# Patient Record
Sex: Male | Born: 1935
Health system: Southern US, Community
[De-identification: ages and names within clinical notes are randomized; demographics above are authoritative.]

## PROBLEM LIST (undated history)

## (undated) DIAGNOSIS — E119 Type 2 diabetes mellitus without complications: Secondary | ICD-10-CM

## (undated) DIAGNOSIS — N289 Disorder of kidney and ureter, unspecified: Secondary | ICD-10-CM

## (undated) DIAGNOSIS — M1990 Unspecified osteoarthritis, unspecified site: Secondary | ICD-10-CM

## (undated) DIAGNOSIS — N429 Disorder of prostate, unspecified: Secondary | ICD-10-CM

## (undated) DIAGNOSIS — R569 Unspecified convulsions: Secondary | ICD-10-CM

## (undated) DIAGNOSIS — N200 Calculus of kidney: Secondary | ICD-10-CM

## (undated) HISTORY — PX: TONSILLECTOMY: SUR1361

---

## 1998-03-20 ENCOUNTER — Ambulatory Visit (HOSPITAL_COMMUNITY): Admission: RE | Admit: 1998-03-20 | Discharge: 1998-03-20 | Payer: Self-pay | Admitting: Family Medicine

## 2003-02-16 ENCOUNTER — Encounter: Admission: RE | Admit: 2003-02-16 | Discharge: 2003-02-16 | Payer: Self-pay | Admitting: Family Medicine

## 2005-01-01 ENCOUNTER — Encounter: Admission: RE | Admit: 2005-01-01 | Discharge: 2005-01-01 | Payer: Self-pay | Admitting: Family Medicine

## 2005-01-16 ENCOUNTER — Encounter: Admission: RE | Admit: 2005-01-16 | Discharge: 2005-01-16 | Payer: Self-pay | Admitting: Family Medicine

## 2005-01-27 ENCOUNTER — Encounter: Admission: RE | Admit: 2005-01-27 | Discharge: 2005-01-27 | Payer: Self-pay | Admitting: Family Medicine

## 2005-03-16 HISTORY — PX: JOINT REPLACEMENT: SHX530

## 2005-11-28 ENCOUNTER — Encounter: Admission: RE | Admit: 2005-11-28 | Discharge: 2005-11-28 | Payer: Self-pay | Admitting: Orthopedic Surgery

## 2006-02-09 ENCOUNTER — Inpatient Hospital Stay (HOSPITAL_COMMUNITY): Admission: RE | Admit: 2006-02-09 | Discharge: 2006-02-11 | Payer: Self-pay | Admitting: Orthopedic Surgery

## 2008-03-16 ENCOUNTER — Emergency Department (HOSPITAL_COMMUNITY): Admission: EM | Admit: 2008-03-16 | Discharge: 2008-03-16 | Payer: Self-pay | Admitting: Family Medicine

## 2009-03-14 ENCOUNTER — Emergency Department (HOSPITAL_COMMUNITY): Admission: EM | Admit: 2009-03-14 | Discharge: 2009-03-14 | Payer: Self-pay | Admitting: Family Medicine

## 2010-04-05 ENCOUNTER — Encounter: Payer: Self-pay | Admitting: Family Medicine

## 2010-06-16 LAB — POCT URINALYSIS DIP (DEVICE)
Bilirubin Urine: NEGATIVE
Glucose, UA: 500 mg/dL — AB
Hgb urine dipstick: NEGATIVE
Ketones, ur: 15 mg/dL — AB
Nitrite: NEGATIVE
Protein, ur: NEGATIVE mg/dL
Specific Gravity, Urine: 1.01 (ref 1.005–1.030)
Urobilinogen, UA: 0.2 mg/dL (ref 0.0–1.0)
pH: 6 (ref 5.0–8.0)

## 2010-06-16 LAB — GLUCOSE, CAPILLARY: Glucose-Capillary: 322 mg/dL — ABNORMAL HIGH (ref 70–99)

## 2010-08-01 NOTE — Op Note (Signed)
[name] [name], [name] [name] NO.:  [id number]   MEDICAL RECORD NO.:  [id number]          PATIENT TYPE:  INP   LOCATION:  2899                         FACILITY:  [facility]   PHYSICIAN:  Madlyn Frankel. Charlann Boxer, M.D.  DATE OF BIRTH:  [birth year]   DATE OF PROCEDURE:  02/09/2006  DATE OF DISCHARGE:                               OPERATIVE REPORT   PREOPERATIVE DIAGNOSIS:  Right hip avascular necrosis, traumatic.   POSTOPERATIVE DIAGNOSIS:  Right hip avascular necrosis, traumatic.   PROCEDURE:  Right total hip replacement.   COMPONENTS USED:  DePuy hip system with size 52 pinnacle cup positioned  at 40-45 degrees of abduction and 15-20 degrees of anteversion, 8 mm  beneath the anterior rim with a single cancellous screw due to excellent  initial scratch fit.  A 36 neutral metal-on-metal liner was used.  The  femoral stem was a Trilock 11.3 lateralized offset stem with a 36 +5  ball.   SURGEON:  Madlyn Frankel. Charlann Boxer, M.D.   ASSISTANT:  Yetta Glassman. Mann, P.A.-C.   ANESTHESIA:  General.   BLOOD LOSS:  500 mL.   DRAINS:  None.   COMPLICATIONS:  None.   INDICATIONS FOR PROCEDURE:  [name] [name] is a 76 year old gentleman who  initially presented to me after an injury at work.  Initially, based on  some vague complaints that he had, it was felt that he had strained back  or had an acute flare up of sciatica. After treatment of this failed to  provide relief, we re-evaluated hip and he had a very significant gait  abnormality consistent with hip disease and radiographs consistent with  a diagnosis of AVN.   At this point, based on the advancement of his disease and rapid onset  of disease consistent with his injury, we discussed surgical options. At  this point, the only surgical option available to him, due to the  collapse and degenerative changes, was hip replacement.  Given his level  of pain, he was ready to proceed with this. We discussed the risks and  benefits including  infection, dislocation, DVT, persistent pain postop,  as well as leg length issues.  Consent was obtained.   PROCEDURE IN DETAIL:  The patient was brought to the operating theater.  Once adequate anesthesia and 2 grams Ancef were administered, the  patient was positioned in the left lateral decubitus position with the  right side up. The right lower extremity was then prepped and draped in  a sterile fashion following a pre-scrub.  A posterolateral incision was  made for a posterior approach to the hip.  The patient's skin tissue,  subcutaneous tissue, and muscle were all very taut.  This required  approximately a 10 inch incision in order to maximize our exposure.  Once the hip was exposed, an L-capsulotomy was carried out to save the  capsule for later repair anatomically.  The hip was then dislocated and  initial neck osteotomy was made into the trochanteric fossa.  Based on  the appearance of this cut, I was going to need to mill and planned on  using the calcar miller in order to get this down to its normal length  following broaching.   Following this neck cut, attention was first directed to the femur. With  the leg oriented 90 degrees of the floor, I used this to determine my  anteversion.  A box osteotome was used to set this anteversion.  I then  hand reamed the canal once, irrigated it to prevent fat emboli, and then  began broaching with an 88 broach.  Broaching was carried up to 11.3  with an excellent fit.  At this point, attention was directed to the  acetabulum.  Acetabular exposure was obtained routinely with a  labrectomy as well as retractor placement.  Note that this was not  routine in terms of its mobility due to the patient's tightness, I did  not take any significant measures and releases as we applied some  pressure without retractors, I was able to expose the hip adequately to  use the curved reamers.   Reaming commenced with a 43 reamer with a straight reamer  down to the  medial wall then carried out with a 45, 47, 49, and 51 reamers with an  excellent bony bed preparation.  Following some further labral  debridement and allowing for rim exposure for placement of the  component, the final 52 pinnacle cup was placed and then impacted.  The  position of the cup was anatomic with about 8 mm of bone exposed on the  anterior rim of the wall and the posterior aspect lined up with the  ischium.  There was about 40-45 degrees of abduction and 15-20 degrees  of forward flexion.   At this point, attention was redirected and our trial liner was placed  followed by placement of a cancellous bone screw.  I then redirected  attention to the femur.  Femoral broach was then placed and the trial  reduction carried out with a lateralized offset neck and a +1.5 ball.  Note that I had to calcar mill down to the level of where the broach was  at this point in order to match up the center of the head with the tip  of the trochanter.  Trial reduction was carried out and the hip was  noted to be pretty tight but this was to be expected given his tissue  tension.  The combined anteversion was 45 degrees.  There was no  impingement with external rotation and impingement, the hip was stable  in the sleep position with 20 degrees of adduction and internal rotation  to 60 degrees.  It was stable to neutral abduction and flexion. There  was soft tissue impingement with his abdomen limiting my assessment but  was able to internally rotate to about 60 degrees at this point, as  well.   At this point, the trial components were removed.  I finished off the  acetabular side by placing a hole eliminator followed by the 36 neutral  metal on metal liner.  Attention was redirected back to the femur.  Further cuts were made on the femur to remove any osteophytes as well as  bone cuts from the calcar milling.  The final 11.3 stem was impacted and actually sat a few mm lower than  where my trial was and for this reason,  I trialed again and decided to go with a +5 ball which enhanced my  stability.  Compared to the down leg and compared to our preoperative  position, the leg lengths  appeared to be within a couple mm of each  other.   Given these parameters, we went ahead and impacted the 36 +5 ball onto a  clean and dried trunnion.  The hip was reduced. The hip was irrigated  throughout the case.  I reapproximated the posterior capsule to the  superior leaflet using #1 Ethilon.  The iliotibial band was  reapproximated using #1 Ethilon.  #1 Vicryl was used on the gluteus  fascia.  The remainder of the wound was irrigated again and closed with  2-0 Vicryl and a 4-0 running Monocryl.  The hip was cleaned, dried, and  dressed sterilely with Steri-Strips, dressing sponges and tape.  The  patient was extubated and brought to the recovery room in stable  condition.     Madlyn Frankel Charlann Boxer, M.D.  Electronically Signed    MDO/MEDQ  D:  02/09/2006  T:  02/09/2006  Job:  [id number]

## 2010-08-01 NOTE — H&P (Signed)
[name] [name], [name] [name]                  ACCOUNT NO.:  [id number]   MEDICAL RECORD NO.:  [id number]          PATIENT TYPE:  INP   LOCATION:  NA                           FACILITY:  [facility]   PHYSICIAN:  Madlyn Frankel. Charlann Boxer, M.D.  DATE OF BIRTH:  1934/05/17   DATE OF ADMISSION:  02/09/2006  DATE OF DISCHARGE:                                HISTORY & PHYSICAL   CHIEF COMPLAINT:  Right hip pain.   HISTORY OF PRESENT ILLNESS:  This is a 76 year old male with a history of  right AVN.  He was originally seen on a worker's compensation presentation  on October 15, 2005, when a large heavy object fell and he had a significant  weight transfer on him, causing him to pivot.  He initially presented with  buttock-type complaints.  We tried conservative measures with  antiinflammatories, which he failed.  He also underwent an epidural nerve  injection which provided no relief.  He has tried all conservative measures,  which have failed and he walks with a significant limp.   PAST MEDICAL HISTORY:  Diabetes type 2.   FAMILY HISTORY:  Cancer.   SOCIAL HISTORY:  Single.  [name] [name], will help with care giving  afterwards.   DRUG ALLERGIES:  No known drug allergies.   MEDICATIONS:  Include Janumet 400 b.i.d.   REVIEW OF SYSTEMS:  No new signs or symptoms of any cardiovascular,  respiratory, gastrointestinal, genitourinary, neurological, musculoskeletal  systems.   PHYSICAL EXAMINATION:  VITAL SIGNS:  Temperature 98.3, pulse 78,  respirations 18, blood pressure 144/76.  GENERAL:  Awake, alert and oriented.  Well- developed well-nourished male.  NECK:  Supple, no carotid bruits, no lymphadenopathy.  CHEST:  Lungs clear to auscultation bilaterally.  BREASTS:  Deferred.  HEART:  Regular rate and rhythm without gallops, clicks, rubs or murmurs.  ABDOMEN:  Soft, obese, nontender, nondistended, bowel sounds present all  four quadrants.  GENITOURINARY:  Deferred.  EXTREMITIES:  A significant  Trendelenburg gait.  Has extremely painful hip  range of motion.  Limited adduction to about 30 to 40 degrees on the right.  Hip flexion and internal rotation produces pain and is limited to  approximately 5 degrees.  SKIN:  No rashes, dorsalis pedis pulse positive.  NEUROLOGIC:  He is intact to sensibilities distal.   LABS PENDING:  [name] [name] preop appointment.   X-RAYS:  Chest x-ray pending, pelvis, right hip taken.   IMPRESSION:  1. Right hip avascular necrosis with degenerative changes.  2. Diabetes type 2.   PLAN OF ACTION:  Right total hip replacement by surgeon, Dr. Durene Romans,  on February 09, 2006.  Risks and complications were discussed.  All  questions were encouraged, answered and reviewed.  We look forward to  treating this very pleasant, independent 76 year old gentleman.     ______________________________  [name] [name]. [name] [name], [place]      Madlyn Frankel. Charlann Boxer, M.D.  Electronically Signed    [name] [name]  D:  02/03/2006  T:  02/03/2006  Job:  [id number]

## 2010-08-01 NOTE — Discharge Summary (Signed)
[name], [name] NO.:  [id number]   MEDICAL RECORD NO.:  [id number]          PATIENT TYPE:  INP   LOCATION:  5002                         FACILITY:  [facility]   PHYSICIAN:  Madlyn Frankel. Charlann Boxer, M.D.  DATE OF BIRTH:  1934/06/14   DATE OF ADMISSION:  02/09/2006  DATE OF DISCHARGE:  02/11/2006                               DISCHARGE SUMMARY   ADMITTING DIAGNOSES:  1. Avascular necrosis.  2. Diabetes type 2.   DISCHARGE DIAGNOSES:  1. Avascular necrosis.  2. Diabetes type 2.   CONSULTS:  None.   PROCEDURE:  Right total hip replacement by surgeon, Dr. Lajoyce Corners.  Assisted by Dwyane Luo.   BRIEF HISTORY OF PRESENT ILLNESS:  [name] [name] is a 76 year old male well  known to our practice with a history of right hip AVN.  He was  refractory to all conservative measures.  His quality of life was  diminished due to the persistent progressive pain.  He was scheduled for  a right total hip replacement.  He was cleared medically before this  procedure.   Preadmission labs shows his preadmission CBC:  Hemoglobin is 15.5,  hematocrit 46.2, tracked throughout his course of stay.  Upon discharge,  hemoglobin 12.7, hematocrit 36.3.   Preadmission routine chemistries:  Sodium 139, potassium 4.2, glucose  114, tracked throughout his course of stay.  Upon discharge, sodium 134,  potassium 3.8, glucose 159.   Postoperative hip showed a well-placed right hip replacement with no  complicating features.   HOSPITAL COURSE:  Patient underwent right total hip replacement and  tolerated the procedure well and was admitted to the orthopedic floor.  Pain was well controlled throughout his course of stay.  He was  neuromuscularly and vascularly intact throughout his course of stay.  Vital signs remained stable throughout.  PT/OT was begun within 24 hours  with partial weightbearing 50% with the use of a rolling walker.  Dressing was changed after 24 hours.  It remained clean, dry, and  intact.  Wound showed minimal ooze and dried blood on bandages.  After  postop day 3, patient was doing very well.  Case management assessment  stated as well as ourselves that he was a home health care discharge.   DISCHARGE DISPOSITION:  Discharged home stable in improved condition  with home health care.   DISCHARGE PHYSICAL THERAPY:  Continue 7 times per week with the use of  rolling walker, partial weightbearing 50%.   OCCUPATIONAL THERAPY:  Per assessment at least 2 times per week times a  week, at which reassessment will be completed.  Goals of physical  therapy are going to be for gait retraining, balance exercises, minimize  pain, maximize range of motion, increase muscle strength, and encourage  independence in the activities of daily living.   DISCHARGE WOUND CARE:  Keep wound dry.  Change dressing on a daily  basis.   DISCHARGE MEDICATIONS:  Include home medication of Janumet 500 mg one  p.o. b.i.d and discharge medications:  1. Lovenox 40 mg subcutaneous q.24 x12 additional days.  2. Norco 5/325 one  to two p.o. q.4-6 p.r.n. pain.  3. Robaxin 500 mg 1 tab q.6 p.r.n. spasms.  4. Colace 100 mg 1 tab p.o. b.i.d. p.r.n. constipation.  5. Iron 1 tab twice daily.   Follow up with Dr. Charlann Boxer at [phone] in approximately 2 weeks.  Return to  our office if his condition worsens; if the patient develops acute  shortness of breath or severe calf pain, call emergency services  immediately.     ______________________________  Yetta Glassman [address], [place]      Madlyn Frankel. Charlann Boxer, M.D.  Electronically Signed    BLM/MEDQ  D:  04/19/2006  T:  04/19/2006  Job:  [id number]

## 2012-08-15 ENCOUNTER — Encounter (HOSPITAL_COMMUNITY): Payer: Self-pay | Admitting: *Deleted

## 2012-08-15 ENCOUNTER — Emergency Department (HOSPITAL_COMMUNITY)
Admission: EM | Admit: 2012-08-15 | Discharge: 2012-08-15 | Disposition: A | Discharge status: Home / Self Care | Payer: Medicare Other | Source: Home / Self Care

## 2012-08-15 DIAGNOSIS — R590 Localized enlarged lymph nodes: Secondary | ICD-10-CM

## 2012-08-15 DIAGNOSIS — R599 Enlarged lymph nodes, unspecified: Secondary | ICD-10-CM

## 2012-08-15 HISTORY — DX: Disorder of prostate, unspecified: N42.9

## 2012-08-15 HISTORY — DX: Type 2 diabetes mellitus without complications: E11.9

## 2012-08-15 MED ORDER — CEPHALEXIN 500 MG PO CAPS: 500.0000 mg | ORAL_CAPSULE | Freq: Four times a day (QID) | ORAL | Status: DC

## 2012-08-15 NOTE — ED Provider Notes (Signed)
Medical screening examination/treatment/procedure(s) were performed by non-physician practitioner and as supervising physician I was immediately available for consultation/collaboration.  Raynald Blend, MD 08/15/12 1316

## 2012-08-15 NOTE — ED Provider Notes (Signed)
History     CSN: [id number]  Arrival date & time 08/15/12  1051   First MD Initiated Contact with Patient 08/15/12 1213      Chief Complaint  Patient presents with  . Facial Swelling    (Consider location/radiation/quality/duration/timing/severity/associated sxs/prior treatment) HPI Comments: Pleasant 78 year old man presents with a concern for swelling of the solitary lymph node to the left anterior cervical chain. He states is not tender or painful. He denies fever, chills, cough, sore throat, earache, toothache or other symptoms. He states he feels generally well and his review of systems are negative.   Past Medical History  Diagnosis Date  . Diabetes mellitus without complication   . Prostate disease     No past surgical history on file.  No family history on file.  History  Substance Use Topics  . Smoking status: Never Smoker   . Smokeless tobacco: Not on file  . Alcohol Use: No      Review of Systems  Constitutional: Negative.   HENT: Negative.   Respiratory: Negative.   Cardiovascular: Negative.   Gastrointestinal: Negative.   Skin: Negative.   Hematological: Positive for adenopathy.  All other systems reviewed and are negative.    Allergies  Review of patient's allergies indicates no known allergies.  Home Medications   Current Outpatient Rx  Name  Route  Sig  Dispense  Refill  . METFORMIN HCL PO   Oral   Take by mouth.         . tamsulosin (FLOMAX) 0.4 MG CAPS   Oral   Take by mouth.           BP 165/60  Pulse 77  Temp(Src) 98.6 F (37 C) (Oral)  Resp 17  SpO2 96%  Physical Exam  Nursing note and vitals reviewed. Constitutional: He is oriented to person, place, and time. He appears well-developed and well-nourished. No distress.  HENT:  Right Ear: External ear normal.  Mouth/Throat: Oropharynx is clear and moist. No oropharyngeal exudate.  Oropharyngeal structures without signs of infection. No tenderness or erythema or  exudates.  Eyes: Conjunctivae and EOM are normal.  Neck: Normal range of motion. Neck supple.  Solitary, nontender lymph node approximately 2 cm in diameter in the left anterior cervical chain. It is nonmobile. There are no other observed for palpable lymph nodes in the posterior, anterior neck, supraclavicular infraclavicular area. No overlying erythema. Patient states he has noticed no other enlargement lymph nodes.  Lymphadenopathy:    He has cervical adenopathy.  Neurological: He is alert and oriented to person, place, and time. He exhibits normal muscle tone.  Skin: Skin is warm and dry. No rash noted. No erythema.  Psychiatric: He has a normal mood and affect.    ED Course  Procedures (including critical care time)  Labs Reviewed - No data to display No results found.   1. Lymphadenopathy of left cervical region       MDM  This is a solitary left anterior cervical lymph node without tenderness or pain. No source of infection or irritation is seen. Due to his normal review of systems I have asked him to just manage with observation for now. I gave him a prescription for Keflex 500 mg 4 times a day for 7 days if he develops other symptoms of shortness of breath, cough, feeling of coming down with an illness, toothache or enlargement or tenderness of the lymph node. Recheck promptly if worse otherwise follow clear PND.  Hayden Rasmussen, NP 08/15/12 1242

## 2012-08-15 NOTE — ED Notes (Signed)
Pt reports  Swelling  To  The  Left  Side  Of  His  Neck      He  denys  Any pain       His  Airway  Is  Intact  And  He  Is  Sitting    Upright on  Exam table  Speaking in  Complete  sentances  He   Reports  He  Had  A  Similar  Episode  In past  That  Resolved

## 2014-05-10 ENCOUNTER — Emergency Department (HOSPITAL_COMMUNITY)
Admission: EM | Admit: 2014-05-10 | Discharge: 2014-05-10 | Disposition: A | Discharge status: Home / Self Care | Payer: Medicare PPO | Source: Home / Self Care | Attending: Family Medicine | Admitting: Family Medicine

## 2014-05-10 ENCOUNTER — Encounter (HOSPITAL_COMMUNITY): Payer: Self-pay | Admitting: *Deleted

## 2014-05-10 ENCOUNTER — Emergency Department (INDEPENDENT_AMBULATORY_CARE_PROVIDER_SITE_OTHER): Payer: Medicare PPO

## 2014-05-10 DIAGNOSIS — M1712 Unilateral primary osteoarthritis, left knee: Secondary | ICD-10-CM

## 2014-05-10 DIAGNOSIS — M25562 Pain in left knee: Secondary | ICD-10-CM

## 2014-05-10 MED ORDER — METHYLPREDNISOLONE ACETATE 40 MG/ML IJ SUSP
INTRAMUSCULAR | Status: AC
  Filled 2014-05-10: qty 1

## 2014-05-10 MED ORDER — LIDOCAINE HCL 2 % IJ SOLN
INTRAMUSCULAR | Status: AC
  Filled 2014-05-10: qty 20

## 2014-05-10 NOTE — ED Provider Notes (Signed)
CSN: [id number]     Arrival date & time 05/10/14  1112 History   First MD Initiated Contact with Patient 05/10/14 1239     Chief Complaint  Patient presents with  . Knee Pain   (Consider location/radiation/quality/duration/timing/severity/associated sxs/prior Treatment) HPI Comments: 80 year old male complaining of left knee pain for one week. States he woke up one morning and felt discomfort, however, when he began to bear weight and walk the pain increase. He states that the pain is almost exclusively with weightbearing and ambulation. It is better with off weight, sitting or lying. Denies any known type of trauma. He did not fall,  twist or otherwise injure his leg or knee. He has not been performing any repetitive activities.   Past Medical History  Diagnosis Date  . Diabetes mellitus without complication   . Prostate disease    History reviewed. No pertinent past surgical history. History reviewed. No pertinent family history. History  Substance Use Topics  . Smoking status: Never Smoker   . Smokeless tobacco: Not on file  . Alcohol Use: No    Review of Systems  Constitutional: Positive for activity change. Negative for fever and fatigue.  Respiratory: Negative.   Cardiovascular: Negative for chest pain.  Genitourinary: Negative.   Musculoskeletal: Positive for joint swelling. Negative for back pain.  Skin: Negative.  Negative for color change.  Neurological: Negative.     Allergies  Review of patient's allergies indicates no known allergies.  Home Medications   Prior to Admission medications   Medication Sig Start Date End Date Taking? Authorizing Provider  ASPIRIN PO Take by mouth.   Yes Historical Provider, MD  cephALEXin (KEFLEX) 500 MG capsule Take 1 capsule (500 mg total) by mouth 4 (four) times daily. 08/15/12   Janne Napoleon, NP  METFORMIN HCL PO Take by mouth.    Historical Provider, MD  tamsulosin (FLOMAX) 0.4 MG CAPS Take by mouth.    Historical Provider, MD    BP 181/98 mmHg  Pulse 90  Temp(Src) 98.2 F (36.8 C) (Oral)  Resp 18  SpO2 97% Physical Exam  Constitutional: He is oriented to person, place, and time. He appears well-developed and well-nourished. No distress.  Pulmonary/Chest: Effort normal. No respiratory distress.  Musculoskeletal:  Left knee with minor swelling to the medial distal knee/thigh. Minor swelling over the proximal medial tibia. No deformity. No discoloration. No increase in warmth Full flexion and extension.   Neurological: He is alert and oriented to person, place, and time.  Skin: Skin is warm and dry.  Psychiatric: He has a normal mood and affect.  Nursing note and vitals reviewed.   ED Course  ARTHOCENTESIS Date/Time: 05/10/2014 2:20 PM Performed by: Marcha Dutton, Aviyana Sonntag Authorized by: Lynne Leader, S Consent: Verbal consent obtained. Risks and benefits: risks, benefits and alternatives were discussed Consent given by: patient Patient understanding: patient states understanding of the procedure being performed Indications: joint swelling and pain  Body area: knee Joint: left knee Local anesthesia used: yes Anesthesia: local infiltration Local anesthetic: lidocaine 2% without epinephrine Anesthetic total: 4 ml Patient sedated: no Preparation: Patient was prepped and draped in the usual sterile fashion. Ultrasound guidance: no Approach: medial Aspirate: blood-tinged Aspirate amount: 10 mL Methylprednisolone amount: 40 mg Lidocaine 2% amount: 4 ml Patient tolerance: Patient tolerated the procedure well with no immediate complications   (including critical care time) Labs Review Labs Reviewed - No data to display  Imaging Review Dg Knee Complete 4 Views Left  05/10/2014   CLINICAL DATA:  Progress hip pain and swelling, no known injury, medial pain  EXAM: LEFT KNEE - COMPLETE 4+ VIEW  COMPARISON:  None.  FINDINGS: Four views of the left knee submitted. Degenerative changes are noted with narrowing of  medial joint compartment. There is spurring of medial tibial plateau. Mild spurring of medial and lateral femoral condyle. Narrowing of patellofemoral joint space. Moderate joint effusion. No acute fracture or subluxation.  IMPRESSION: No acute fracture or subluxation. Moderate joint effusion. Degenerative changes as described above.   Electronically Signed   By: Lahoma Crocker M.D.   On: 05/10/2014 13:46     MDM   1. Primary osteoarthritis of left knee   2. Knee pain, acute, left    Care instructions for post-knee aspiration and injection Follow with your PCP. Has some point may need to have a referral to orthopedist.   Janne Napoleon, NP 05/10/14 1421

## 2014-05-10 NOTE — Discharge Instructions (Signed)
Joint Injection Care After Refer to this sheet in the next few days. These instructions provide you with information on caring for yourself after you have had a joint injection. Your caregiver also may give you more specific instructions. Your treatment has been planned according to current medical practices, but problems sometimes occur. Call your caregiver if you have any problems or questions after your procedure. After any type of joint injection, it is not uncommon to experience:  Soreness, swelling, or bruising around the injection site.  Mild numbness, tingling, or weakness around the injection site caused by the numbing medicine used before or with the injection. It also is possible to experience the following effects associated with the specific agent after injection:  Iodine-based contrast agents:  Allergic reaction (itching, hives, widespread redness, and swelling beyond the injection site).  Corticosteroids (These effects are rare.):  Allergic reaction.  Increased blood sugar levels (If you have diabetes and you notice that your blood sugar levels have increased, notify your caregiver).  Increased blood pressure levels.  Mood swings.  Hyaluronic acid in the use of viscosupplementation.  Temporary heat or redness.  Temporary rash and itching.  Increased fluid accumulation in the injected joint. These effects all should resolve within a day after your procedure.  HOME CARE INSTRUCTIONS  Limit yourself to light activity the day of your procedure. Avoid lifting heavy objects, bending, stooping, or twisting.  Take prescription or over-the-counter pain medication as directed by your caregiver.  You may apply ice to your injection site to reduce pain and swelling the day of your procedure. Ice may be applied 03-04 times:  Put ice in a plastic bag.  Place a towel between your skin and the bag.  Leave the ice on for no longer than 15-20 minutes each time. SEEK  IMMEDIATE MEDICAL CARE IF:   Pain and swelling get worse rather than better or extend beyond the injection site.  Numbness does not go away.  Blood or fluid continues to leak from the injection site.  You have chest pain.  You have swelling of your face or tongue.  You have trouble breathing or you become dizzy.  You develop a fever, chills, or severe tenderness at the injection site that last longer than 1 day. MAKE SURE YOU:  Understand these instructions.  Watch your condition.  Get help right away if you are not doing well or if you get worse. Document Released: 11/13/2010 Document Revised: 05/25/2011 Document Reviewed: 11/13/2010 [facility] Patient Information 2015 [place], [place]. This information is not intended to replace advice given to you by your health care provider. Make sure you discuss any questions you have with your health care provider.  Knee Pain The knee is the complex joint between your thigh and your lower leg. It is made up of bones, tendons, ligaments, and cartilage. The bones that make up the knee are:  The femur in the thigh.  The tibia and fibula in the lower leg.  The patella or kneecap riding in the groove on the lower femur. CAUSES  Knee pain is a common complaint with many causes. A few of these causes are:  Injury, such as:  A ruptured ligament or tendon injury.  Torn cartilage.  Medical conditions, such as:  Gout  Arthritis  Infections  Overuse, over training, or overdoing a physical activity. Knee pain can be minor or severe. Knee pain can accompany debilitating injury. Minor knee problems often respond well to self-care measures or get well on their own.  More serious injuries may need medical intervention or even surgery. SYMPTOMS The knee is complex. Symptoms of knee problems can vary widely. Some of the problems are:  Pain with movement and weight bearing.  Swelling and tenderness.  Buckling of the knee.  Inability to  straighten or extend your knee.  Your knee locks and you cannot straighten it.  Warmth and redness with pain and fever.  Deformity or dislocation of the kneecap. DIAGNOSIS  Determining what is wrong may be very straight forward such as when there is an injury. It can also be challenging because of the complexity of the knee. Tests to make a diagnosis may include:  Your caregiver taking a history and doing a physical exam.  Routine X-rays can be used to rule out other problems. X-rays will not reveal a cartilage tear. Some injuries of the knee can be diagnosed by:  Arthroscopy a surgical technique by which a small video camera is inserted through tiny incisions on the sides of the knee. This procedure is used to examine and repair internal knee joint problems. Tiny instruments can be used during arthroscopy to repair the torn knee cartilage (meniscus).  Arthrography is a radiology technique. A contrast liquid is directly injected into the knee joint. Internal structures of the knee joint then become visible on X-ray film.  An MRI scan is a non X-ray radiology procedure in which magnetic fields and a computer produce two- or three-dimensional images of the inside of the knee. Cartilage tears are often visible using an MRI scanner. MRI scans have largely replaced arthrography in diagnosing cartilage tears of the knee.  Blood work.  Examination of the fluid that helps to lubricate the knee joint (synovial fluid). This is done by taking a sample out using a needle and a syringe. TREATMENT The treatment of knee problems depends on the cause. Some of these treatments are:  Depending on the injury, proper casting, splinting, surgery, or physical therapy care will be needed.  Give yourself adequate recovery time. Do not overuse your joints. If you begin to get sore during workout routines, back off. Slow down or do fewer repetitions.  For repetitive activities such as cycling or running,  maintain your strength and nutrition.  Alternate muscle groups. For example, if you are a weight lifter, work the upper body on one day and the lower body the next.  Either tight or weak muscles do not give the proper support for your knee. Tight or weak muscles do not absorb the stress placed on the knee joint. Keep the muscles surrounding the knee strong.  Take care of mechanical problems.  If you have flat feet, orthotics or special shoes may help. See your caregiver if you need help.  Arch supports, sometimes with wedges on the inner or outer aspect of the heel, can help. These can shift pressure away from the side of the knee most bothered by osteoarthritis.  A brace called an "unloader" brace also may be used to help ease the pressure on the most arthritic side of the knee.  If your caregiver has prescribed crutches, braces, wraps or ice, use as directed. The acronym for this is PRICE. This means protection, rest, ice, compression, and elevation.  Nonsteroidal anti-inflammatory drugs (NSAIDs), can help relieve pain. But if taken immediately after an injury, they may actually increase swelling. Take NSAIDs with food in your stomach. Stop them if you develop stomach problems. Do not take these if you have a history of ulcers, stomach pain,  or bleeding from the bowel. Do not take without your caregiver's approval if you have problems with fluid retention, heart failure, or kidney problems.  For ongoing knee problems, physical therapy may be helpful.  Glucosamine and chondroitin are over-the-counter dietary supplements. Both may help relieve the pain of osteoarthritis in the knee. These medicines are different from the usual anti-inflammatory drugs. Glucosamine may decrease the rate of cartilage destruction.  Injections of a corticosteroid drug into your knee joint may help reduce the symptoms of an arthritis flare-up. They may provide pain relief that lasts a few months. You may have to wait  a few months between injections. The injections do have a small increased risk of infection, water retention, and elevated blood sugar levels.  Hyaluronic acid injected into damaged joints may ease pain and provide lubrication. These injections may work by reducing inflammation. A series of shots may give relief for as long as 6 months.  Topical painkillers. Applying certain ointments to your skin may help relieve the pain and stiffness of osteoarthritis. Ask your pharmacist for suggestions. Many over the-counter products are approved for temporary relief of arthritis pain.  In some countries, doctors often prescribe topical NSAIDs for relief of chronic conditions such as arthritis and tendinitis. A review of treatment with NSAID creams found that they worked as well as oral medications but without the serious side effects. PREVENTION  Maintain a healthy weight. Extra pounds put more strain on your joints.  Get strong, stay limber. Weak muscles are a common cause of knee injuries. Stretching is important. Include flexibility exercises in your workouts.  Be smart about exercise. If you have osteoarthritis, chronic knee pain or recurring injuries, you may need to change the way you exercise. This does not mean you have to stop being active. If your knees ache after jogging or playing basketball, consider switching to swimming, water aerobics, or other low-impact activities, at least for a few days a week. Sometimes limiting high-impact activities will provide relief.  Make sure your shoes fit well. Choose footwear that is right for your sport.  Protect your knees. Use the proper gear for knee-sensitive activities. Use kneepads when playing volleyball or laying carpet. Buckle your seat belt every time you drive. Most shattered kneecaps occur in car accidents.  Rest when you are tired. SEEK MEDICAL CARE IF:  You have knee pain that is continual and does not seem to be getting better.  SEEK  IMMEDIATE MEDICAL CARE IF:  Your knee joint feels hot to the touch and you have a high fever. MAKE SURE YOU:   Understand these instructions.  Will watch your condition.  Will get help right away if you are not doing well or get worse. Document Released: 12/28/2006 Document Revised: 05/25/2011 Document Reviewed: 12/28/2006 [facility] Patient Information 2015 [place], [place]. This information is not intended to replace advice given to you by your health care provider. Make sure you discuss any questions you have with your health care provider.  Arthralgia Arthralgia is joint pain. A joint is a place where two bones meet. Joint pain can happen for many reasons. The joint can be bruised, stiff, infected, or weak from aging. Pain usually goes away after resting and taking medicine for soreness.  HOME CARE  Rest the joint as told by your doctor.  Keep the sore joint raised (elevated) for the first 24 hours.  Put ice on the joint area.  Put ice in a plastic bag.  Place a towel between your skin and  the bag.  Leave the ice on for 15-20 minutes, 03-04 times a day.  Wear your splint, casting, elastic bandage, or sling as told by your doctor.  Only take medicine as told by your doctor. Do not take aspirin.  Use crutches as told by your doctor. Do not put weight on the joint until told to by your doctor. GET HELP RIGHT AWAY IF:   You have bruising, puffiness (swelling), or more pain.  Your fingers or toes turn blue or start to lose feeling (numb).  Your medicine does not lessen the pain.  Your pain becomes severe.  You have a temperature by mouth above 102 F (38.9 C), not controlled by medicine.  You cannot move or use the joint. MAKE SURE YOU:   Understand these instructions.  Will watch your condition.  Will get help right away if you are not doing well or get worse. Document Released: 02/18/2009 Document Revised: 05/25/2011 Document Reviewed: 02/18/2009 [facility] Patient  Information 2015 [place], [place]. This information is not intended to replace advice given to you by your health care provider. Make sure you discuss any questions you have with your health care provider.

## 2014-05-10 NOTE — ED Notes (Signed)
Pt   Reports  l  Knee  Pain      X   1  Week        denys  Any  specefic  Injury         Pt  Reports  Symptoms  Began  About  1  Week   Ago   No  Obvious  Deformity  Noted    Ambulated  To  Room  With a  Steady  Fluid  Gait

## 2015-08-08 ENCOUNTER — Ambulatory Visit (INDEPENDENT_AMBULATORY_CARE_PROVIDER_SITE_OTHER): Payer: Medicare HMO

## 2015-08-08 ENCOUNTER — Encounter (HOSPITAL_COMMUNITY): Payer: Self-pay | Admitting: Emergency Medicine

## 2015-08-08 ENCOUNTER — Ambulatory Visit (HOSPITAL_COMMUNITY)
Admission: EM | Admit: 2015-08-08 | Discharge: 2015-08-08 | Disposition: A | Discharge status: Nursing Home (Includes ICF) | Payer: Medicare HMO | Source: Home / Self Care | Attending: Family Medicine | Admitting: Family Medicine

## 2015-08-08 DIAGNOSIS — N200 Calculus of kidney: Secondary | ICD-10-CM

## 2015-08-08 NOTE — Discharge Instructions (Signed)
Go to [facility] er for eval of kidney stones.

## 2015-08-08 NOTE — ED Notes (Signed)
Pt has been advised to go to [facility] ED immediately w/NPO... Pt verb understanding.

## 2015-08-08 NOTE — ED Notes (Signed)
Pt c/o diarrhea and feeling nauseas onset x3 days associated w/dry heaving  Denies fevers, chills, abd pain A&O x4... No acute distress.

## 2015-08-08 NOTE — ED Provider Notes (Signed)
CSN: [id number]     Arrival date & time 08/08/15  1300 History   First MD Initiated Contact with Patient 08/08/15 1319     Chief Complaint  Patient presents with  . Diarrhea  . Nausea   (Consider location/radiation/quality/duration/timing/severity/associated sxs/prior Treatment) Patient is a 81 y.o. male presenting with diarrhea. The history is provided by the patient.  Diarrhea Quality:  Semi-solid and watery Severity:  Mild Onset quality:  Sudden Duration:  3 days Progression:  Unchanged (last episode this am.) Associated symptoms: abdominal pain   Associated symptoms: no fever and no vomiting   Associated symptoms comment:  Nausea with dry heaves, eating nl diet. Risk factors: no sick contacts, no suspicious food intake and no travel to endemic areas     Past Medical History  Diagnosis Date  . Diabetes mellitus without complication ([place])   . Prostate disease    History reviewed. No pertinent past surgical history. No family history on file. Social History  Substance Use Topics  . Smoking status: Never Smoker   . Smokeless tobacco: None  . Alcohol Use: No    Review of Systems  Constitutional: Negative.  Negative for fever and appetite change.  HENT: Negative.   Respiratory: Negative.   Cardiovascular: Negative.   Gastrointestinal: Positive for nausea, abdominal pain and diarrhea. Negative for vomiting and blood in stool.  Genitourinary: Negative.   All other systems reviewed and are negative.   Allergies  Review of patient's allergies indicates no known allergies.  Home Medications   Prior to Admission medications   Medication Sig Start Date End Date Taking? Authorizing Provider  ASPIRIN PO Take by mouth.   Yes Historical Provider, MD  lisinopril (PRINIVIL,ZESTRIL) 20 MG tablet Take 20 mg by mouth daily.   Yes Historical Provider, MD  METFORMIN HCL PO Take by mouth.   Yes Historical Provider, MD  simvastatin (ZOCOR) 20 MG tablet Take 20 mg by mouth daily.    Yes Historical Provider, MD  tamsulosin (FLOMAX) 0.4 MG CAPS Take by mouth.   Yes Historical Provider, MD  cephALEXin (KEFLEX) 500 MG capsule Take 1 capsule (500 mg total) by mouth 4 (four) times daily. 08/15/12   Janne Napoleon, NP   Meds Ordered and Administered this Visit  Medications - No data to display  BP 177/92 mmHg  Pulse 93  Temp(Src) 98.1 F (36.7 C) (Oral)  Resp 24  SpO2 100% No data found.   Physical Exam  Constitutional: He is oriented to person, place, and time. He appears well-developed and well-nourished. No distress.  HENT:  Mouth/Throat: Oropharynx is clear and moist.  Neck: Normal range of motion. Neck supple.  Cardiovascular: Normal heart sounds.   Pulmonary/Chest: Breath sounds normal.  Abdominal: Soft. Bowel sounds are normal. He exhibits no distension and no mass. There is no tenderness. There is no rebound and no guarding.  Musculoskeletal: He exhibits no edema.  Lymphadenopathy:    He has no cervical adenopathy.  Neurological: He is alert and oriented to person, place, and time.  Skin: Skin is warm and dry.  Nursing note and vitals reviewed.   ED Course  Procedures (including critical care time)  Labs Review Labs Reviewed - No data to display  Imaging Review No results found. X-rays reviewed and report per radiologist.   Visual Acuity Review  Right Eye Distance:   Left Eye Distance:   Bilateral Distance:    Right Eye Near:   Left Eye Near:    Bilateral Near:  MDM  No diagnosis found.  Discussed with dr Matilde Sprang, rec send to [facility] for ct scan and stone eval. Pt in agreement.   Billy Fischer, MD 08/08/15 [phone]

## 2015-08-10 ENCOUNTER — Encounter (HOSPITAL_COMMUNITY)
Admission: EM | Disposition: A | Discharge status: Home / Self Care | Payer: Self-pay | Source: Home / Self Care | Attending: Internal Medicine

## 2015-08-10 ENCOUNTER — Emergency Department (HOSPITAL_COMMUNITY): Payer: Medicare HMO | Admitting: Anesthesiology

## 2015-08-10 ENCOUNTER — Inpatient Hospital Stay (HOSPITAL_COMMUNITY)
Admission: EM | Admit: 2015-08-10 | Discharge: 2015-08-12 | DRG: 683 | Disposition: A | Discharge status: Home / Self Care | Payer: Medicare HMO | Attending: Internal Medicine | Admitting: Internal Medicine

## 2015-08-10 ENCOUNTER — Encounter (HOSPITAL_COMMUNITY): Payer: Self-pay | Admitting: Emergency Medicine

## 2015-08-10 ENCOUNTER — Emergency Department (HOSPITAL_COMMUNITY): Payer: Medicare HMO

## 2015-08-10 ENCOUNTER — Ambulatory Visit: Payer: Self-pay | Admitting: Urology

## 2015-08-10 DIAGNOSIS — Z79899 Other long term (current) drug therapy: Secondary | ICD-10-CM | POA: Diagnosis not present

## 2015-08-10 DIAGNOSIS — E119 Type 2 diabetes mellitus without complications: Secondary | ICD-10-CM | POA: Diagnosis present

## 2015-08-10 DIAGNOSIS — N200 Calculus of kidney: Secondary | ICD-10-CM

## 2015-08-10 DIAGNOSIS — Z7982 Long term (current) use of aspirin: Secondary | ICD-10-CM

## 2015-08-10 DIAGNOSIS — N1330 Unspecified hydronephrosis: Secondary | ICD-10-CM | POA: Diagnosis present

## 2015-08-10 DIAGNOSIS — E875 Hyperkalemia: Secondary | ICD-10-CM | POA: Diagnosis present

## 2015-08-10 DIAGNOSIS — N210 Calculus in bladder: Secondary | ICD-10-CM | POA: Diagnosis present

## 2015-08-10 DIAGNOSIS — R338 Other retention of urine: Secondary | ICD-10-CM | POA: Diagnosis not present

## 2015-08-10 DIAGNOSIS — Z7984 Long term (current) use of oral hypoglycemic drugs: Secondary | ICD-10-CM

## 2015-08-10 DIAGNOSIS — N320 Bladder-neck obstruction: Secondary | ICD-10-CM | POA: Diagnosis present

## 2015-08-10 DIAGNOSIS — I10 Essential (primary) hypertension: Secondary | ICD-10-CM | POA: Diagnosis present

## 2015-08-10 DIAGNOSIS — N139 Obstructive and reflux uropathy, unspecified: Secondary | ICD-10-CM | POA: Diagnosis present

## 2015-08-10 DIAGNOSIS — N179 Acute kidney failure, unspecified: Principal | ICD-10-CM | POA: Diagnosis present

## 2015-08-10 DIAGNOSIS — N170 Acute kidney failure with tubular necrosis: Secondary | ICD-10-CM

## 2015-08-10 DIAGNOSIS — N400 Enlarged prostate without lower urinary tract symptoms: Secondary | ICD-10-CM | POA: Diagnosis present

## 2015-08-10 DIAGNOSIS — N202 Calculus of kidney with calculus of ureter: Secondary | ICD-10-CM | POA: Diagnosis present

## 2015-08-10 DIAGNOSIS — E118 Type 2 diabetes mellitus with unspecified complications: Secondary | ICD-10-CM | POA: Diagnosis not present

## 2015-08-10 DIAGNOSIS — E869 Volume depletion, unspecified: Secondary | ICD-10-CM | POA: Diagnosis present

## 2015-08-10 HISTORY — PX: CYSTOSCOPY W/ URETERAL STENT PLACEMENT: SHX1429

## 2015-08-10 HISTORY — DX: Disorder of kidney and ureter, unspecified: N28.9

## 2015-08-10 LAB — URINALYSIS, ROUTINE W REFLEX MICROSCOPIC
Bilirubin Urine: NEGATIVE
Glucose, UA: NEGATIVE mg/dL
Ketones, ur: NEGATIVE mg/dL
Nitrite: NEGATIVE
Protein, ur: NEGATIVE mg/dL
Specific Gravity, Urine: 1.013 (ref 1.005–1.030)
pH: 5 (ref 5.0–8.0)

## 2015-08-10 LAB — CBC WITH DIFFERENTIAL/PLATELET
Basophils Absolute: 0 10*3/uL (ref 0.0–0.1)
Basophils Relative: 0 %
Eosinophils Absolute: 0.1 10*3/uL (ref 0.0–0.7)
Eosinophils Relative: 1 %
HCT: 41 % (ref 39.0–52.0)
Hemoglobin: 14.2 g/dL (ref 13.0–17.0)
Lymphocytes Relative: 12 %
Lymphs Abs: 1.3 10*3/uL (ref 0.7–4.0)
MCH: 32.2 pg (ref 26.0–34.0)
MCHC: 34.6 g/dL (ref 30.0–36.0)
MCV: 93 fL (ref 78.0–100.0)
Monocytes Absolute: 0.6 10*3/uL (ref 0.1–1.0)
Monocytes Relative: 6 %
Neutro Abs: 9.1 10*3/uL — ABNORMAL HIGH (ref 1.7–7.7)
Neutrophils Relative %: 81 %
Platelets: 229 10*3/uL (ref 150–400)
RBC: 4.41 MIL/uL (ref 4.22–5.81)
RDW: 13.1 % (ref 11.5–15.5)
WBC: 11.2 10*3/uL — ABNORMAL HIGH (ref 4.0–10.5)

## 2015-08-10 LAB — GLUCOSE, CAPILLARY
GLUCOSE-CAPILLARY: 181 mg/dL — AB (ref 65–99)
Glucose-Capillary: 102 mg/dL — ABNORMAL HIGH (ref 65–99)

## 2015-08-10 LAB — I-STAT TROPONIN, ED: Troponin i, poc: 0.04 ng/mL (ref 0.00–0.08)

## 2015-08-10 LAB — BASIC METABOLIC PANEL
Anion gap: 12 (ref 5–15)
BUN: 105 mg/dL — AB (ref 6–20)
CALCIUM: 8.2 mg/dL — AB (ref 8.9–10.3)
CHLORIDE: 111 mmol/L (ref 101–111)
CO2: 12 mmol/L — ABNORMAL LOW (ref 22–32)
CREATININE: 9.4 mg/dL — AB (ref 0.61–1.24)
GFR calc Af Amer: 5 mL/min — ABNORMAL LOW (ref >60)
GFR calc non Af Amer: 5 mL/min — ABNORMAL LOW (ref >60)
Glucose, Bld: 117 mg/dL — ABNORMAL HIGH (ref 65–99)
Potassium: 5.3 mmol/L — ABNORMAL HIGH (ref 3.5–5.1)
SODIUM: 135 mmol/L (ref 135–145)

## 2015-08-10 LAB — URINE MICROSCOPIC-ADD ON

## 2015-08-10 LAB — CBG MONITORING, ED: GLUCOSE-CAPILLARY: 99 mg/dL (ref 65–99)

## 2015-08-10 LAB — LIPASE, BLOOD: Lipase: 60 U/L — ABNORMAL HIGH (ref 11–51)

## 2015-08-10 SURGERY — CYSTOSCOPY, WITH RETROGRADE PYELOGRAM AND URETERAL STENT INSERTION
Anesthesia: General | Site: Ureter | Laterality: Bilateral

## 2015-08-10 MED ORDER — SODIUM CHLORIDE 0.9% FLUSH: 3.0000 mL | INTRAVENOUS | Status: DC | PRN

## 2015-08-10 MED ORDER — PROPOFOL 10 MG/ML IV BOLUS
INTRAVENOUS | Status: DC | PRN
  Administered 2015-08-10: 150 mg via INTRAVENOUS
  Administered 2015-08-10: 20 mg via INTRAVENOUS

## 2015-08-10 MED ORDER — HYDROMORPHONE HCL 1 MG/ML IJ SOLN
0.2500 mg | INTRAMUSCULAR | Status: DC | PRN
Start: 2015-08-10 — End: 2015-08-10

## 2015-08-10 MED ORDER — ONDANSETRON HCL 4 MG PO TABS: 4.0000 mg | ORAL_TABLET | Freq: Four times a day (QID) | ORAL | Status: DC | PRN

## 2015-08-10 MED ORDER — LIDOCAINE HCL (CARDIAC) 20 MG/ML IV SOLN
INTRAVENOUS | Status: AC
  Filled 2015-08-10: qty 5

## 2015-08-10 MED ORDER — OXYCODONE HCL 5 MG PO TABS: 5.0000 mg | ORAL_TABLET | ORAL | Status: DC | PRN

## 2015-08-10 MED ORDER — ACETAMINOPHEN 325 MG PO TABS: 650.0000 mg | ORAL_TABLET | Freq: Four times a day (QID) | ORAL | Status: DC | PRN

## 2015-08-10 MED ORDER — MEPERIDINE HCL 25 MG/ML IJ SOLN: 6.2500 mg | INTRAMUSCULAR | Status: DC | PRN

## 2015-08-10 MED ORDER — LIDOCAINE HCL (CARDIAC) 20 MG/ML IV SOLN
INTRAVENOUS | Status: DC | PRN
  Administered 2015-08-10: 80 mg via INTRAVENOUS

## 2015-08-10 MED ORDER — OXYCODONE HCL 5 MG PO TABS: 5.0000 mg | ORAL_TABLET | Freq: Once | ORAL | Status: DC | PRN

## 2015-08-10 MED ORDER — SODIUM CHLORIDE 0.9% FLUSH: 3.0000 mL | Freq: Two times a day (BID) | INTRAVENOUS | Status: DC

## 2015-08-10 MED ORDER — FUROSEMIDE 10 MG/ML IJ SOLN
40.0000 mg | Freq: Once | INTRAMUSCULAR | Status: AC
  Administered 2015-08-10: 40 mg via INTRAVENOUS
  Filled 2015-08-10: qty 4

## 2015-08-10 MED ORDER — PROPOFOL 10 MG/ML IV BOLUS
INTRAVENOUS | Status: AC
  Filled 2015-08-10: qty 20

## 2015-08-10 MED ORDER — DIATRIZOATE MEGLUMINE 30 % UR SOLN
URETHRAL | Status: DC | PRN
  Administered 2015-08-10: 300 mL via URETHRAL

## 2015-08-10 MED ORDER — PHENYLEPHRINE HCL 10 MG/ML IJ SOLN
INTRAMUSCULAR | Status: DC | PRN
  Administered 2015-08-10 (×2): 120 ug via INTRAVENOUS
  Administered 2015-08-10 (×2): 80 ug via INTRAVENOUS

## 2015-08-10 MED ORDER — ALBUTEROL SULFATE (2.5 MG/3ML) 0.083% IN NEBU: 2.5000 mg | INHALATION_SOLUTION | RESPIRATORY_TRACT | Status: DC | PRN

## 2015-08-10 MED ORDER — INSULIN ASPART 100 UNIT/ML IV SOLN
10.0000 [IU] | Freq: Once | INTRAVENOUS | Status: AC
  Administered 2015-08-10: 10 [IU] via INTRAVENOUS
  Filled 2015-08-10: qty 0.1

## 2015-08-10 MED ORDER — ENSURE ENLIVE PO LIQD
237.0000 mL | Freq: Two times a day (BID) | ORAL | Status: DC
  Administered 2015-08-12: 237 mL via ORAL

## 2015-08-10 MED ORDER — ROCURONIUM BROMIDE 100 MG/10ML IV SOLN
INTRAVENOUS | Status: AC
  Filled 2015-08-10: qty 1

## 2015-08-10 MED ORDER — PNEUMOCOCCAL VAC POLYVALENT 25 MCG/0.5ML IJ INJ
0.5000 mL | INJECTION | INTRAMUSCULAR | Status: DC
  Filled 2015-08-10 (×2): qty 0.5

## 2015-08-10 MED ORDER — VITAMIN B-1 100 MG PO TABS
100.0000 mg | ORAL_TABLET | Freq: Every day | ORAL | Status: DC
  Administered 2015-08-10 – 2015-08-12 (×3): 100 mg via ORAL
  Filled 2015-08-10 (×3): qty 1

## 2015-08-10 MED ORDER — SODIUM CHLORIDE 0.9 % IV SOLN
1000.0000 mL | INTRAVENOUS | Status: DC
  Administered 2015-08-10: 1000 mL via INTRAVENOUS
  Administered 2015-08-10: 15:00:00 via INTRAVENOUS

## 2015-08-10 MED ORDER — TAMSULOSIN HCL 0.4 MG PO CAPS
0.4000 mg | ORAL_CAPSULE | Freq: Two times a day (BID) | ORAL | Status: DC
  Administered 2015-08-11 – 2015-08-12 (×3): 0.4 mg via ORAL
  Filled 2015-08-10 (×3): qty 1

## 2015-08-10 MED ORDER — INSULIN ASPART 100 UNIT/ML ~~LOC~~ SOLN: 0.0000 [IU] | Freq: Every day | SUBCUTANEOUS | Status: DC

## 2015-08-10 MED ORDER — SODIUM POLYSTYRENE SULFONATE 15 GM/60ML PO SUSP
30.0000 g | Freq: Once | ORAL | Status: AC
  Administered 2015-08-10: 30 g via ORAL
  Filled 2015-08-10: qty 120

## 2015-08-10 MED ORDER — SODIUM CHLORIDE 0.9 % IV SOLN
1000.0000 mL | Freq: Once | INTRAVENOUS | Status: AC
  Administered 2015-08-10: 1000 mL via INTRAVENOUS

## 2015-08-10 MED ORDER — POLYETHYLENE GLYCOL 3350 17 G PO PACK: 17.0000 g | PACK | Freq: Every day | ORAL | Status: DC | PRN

## 2015-08-10 MED ORDER — SODIUM CHLORIDE 0.9 % IR SOLN
Status: DC | PRN
  Administered 2015-08-10: 3000 mL

## 2015-08-10 MED ORDER — DIATRIZOATE MEGLUMINE 30 % UR SOLN
URETHRAL | Status: AC
  Filled 2015-08-10: qty 100

## 2015-08-10 MED ORDER — ACETAMINOPHEN 650 MG RE SUPP: 650.0000 mg | Freq: Four times a day (QID) | RECTAL | Status: DC | PRN

## 2015-08-10 MED ORDER — FENTANYL CITRATE (PF) 100 MCG/2ML IJ SOLN
INTRAMUSCULAR | Status: AC
  Filled 2015-08-10: qty 2

## 2015-08-10 MED ORDER — DEXTROSE 50 % IV SOLN
INTRAVENOUS | Status: DC | PRN
  Administered 2015-08-10: 25 mL via INTRAVENOUS

## 2015-08-10 MED ORDER — SENNA 8.6 MG PO TABS
1.0000 | ORAL_TABLET | Freq: Two times a day (BID) | ORAL | Status: DC
  Administered 2015-08-11 – 2015-08-12 (×3): 8.6 mg via ORAL
  Filled 2015-08-10 (×4): qty 1

## 2015-08-10 MED ORDER — FENTANYL CITRATE (PF) 100 MCG/2ML IJ SOLN
INTRAMUSCULAR | Status: DC | PRN
  Administered 2015-08-10: 50 ug via INTRAVENOUS

## 2015-08-10 MED ORDER — TRAZODONE HCL 50 MG PO TABS: 50.0000 mg | ORAL_TABLET | Freq: Every evening | ORAL | Status: DC | PRN

## 2015-08-10 MED ORDER — ONDANSETRON HCL 4 MG/2ML IJ SOLN
INTRAMUSCULAR | Status: DC | PRN
  Administered 2015-08-10: 4 mg via INTRAVENOUS

## 2015-08-10 MED ORDER — DEXTROSE 50 % IV SOLN
1.0000 | Freq: Once | INTRAVENOUS | Status: AC
  Administered 2015-08-10: 50 mL via INTRAVENOUS
  Filled 2015-08-10: qty 50

## 2015-08-10 MED ORDER — MORPHINE SULFATE (PF) 2 MG/ML IV SOLN: 2.0000 mg | INTRAVENOUS | Status: DC | PRN

## 2015-08-10 MED ORDER — SODIUM POLYSTYRENE SULFONATE 15 GM/60ML PO SUSP
45.0000 g | Freq: Once | ORAL | Status: DC
  Filled 2015-08-10: qty 180

## 2015-08-10 MED ORDER — ONDANSETRON HCL 4 MG/2ML IJ SOLN
INTRAMUSCULAR | Status: AC
  Filled 2015-08-10: qty 2

## 2015-08-10 MED ORDER — ONDANSETRON HCL 4 MG/2ML IJ SOLN: 4.0000 mg | Freq: Four times a day (QID) | INTRAMUSCULAR | Status: DC | PRN

## 2015-08-10 MED ORDER — PHENYLEPHRINE HCL 10 MG/ML IJ SOLN
10.0000 mg | INTRAVENOUS | Status: DC | PRN
  Administered 2015-08-10: 50 ug/min via INTRAVENOUS

## 2015-08-10 MED ORDER — DEXTROSE 50 % IV SOLN
INTRAVENOUS | Status: AC
  Filled 2015-08-10: qty 50

## 2015-08-10 MED ORDER — PHENYLEPHRINE 40 MCG/ML (10ML) SYRINGE FOR IV PUSH (FOR BLOOD PRESSURE SUPPORT)
PREFILLED_SYRINGE | INTRAVENOUS | Status: AC
  Filled 2015-08-10: qty 10

## 2015-08-10 MED ORDER — INSULIN ASPART 100 UNIT/ML ~~LOC~~ SOLN
0.0000 [IU] | Freq: Three times a day (TID) | SUBCUTANEOUS | Status: DC
  Administered 2015-08-11 (×2): 1 [IU] via SUBCUTANEOUS
  Administered 2015-08-11: 2 [IU] via SUBCUTANEOUS
  Administered 2015-08-12: 1 [IU] via SUBCUTANEOUS

## 2015-08-10 MED ORDER — SODIUM CHLORIDE 0.9 % IV SOLN: INTRAVENOUS | Status: DC

## 2015-08-10 MED ORDER — SODIUM CHLORIDE 0.9 % IV SOLN: 250.0000 mL | INTRAVENOUS | Status: DC | PRN

## 2015-08-10 MED ORDER — OXYCODONE HCL 5 MG/5ML PO SOLN: 5.0000 mg | Freq: Once | ORAL | Status: DC | PRN

## 2015-08-10 MED ORDER — SIMVASTATIN 20 MG PO TABS
20.0000 mg | ORAL_TABLET | Freq: Every day | ORAL | Status: DC
  Administered 2015-08-11: 20 mg via ORAL
  Filled 2015-08-10: qty 1

## 2015-08-10 MED ORDER — FOLIC ACID 1 MG PO TABS
1.0000 mg | ORAL_TABLET | Freq: Every day | ORAL | Status: DC
  Administered 2015-08-10 – 2015-08-12 (×3): 1 mg via ORAL
  Filled 2015-08-10 (×3): qty 1

## 2015-08-10 MED ORDER — CIPROFLOXACIN IN D5W 200 MG/100ML IV SOLN
200.0000 mg | INTRAVENOUS | Status: AC
  Administered 2015-08-10: 200 mg via INTRAVENOUS
  Filled 2015-08-10: qty 100

## 2015-08-10 SURGICAL SUPPLY — 15 items
BAG URO CATCHER STRL LF (MISCELLANEOUS) ×2 IMPLANT
CATH FOLEY 2WAY SLVR  5CC 16FR (CATHETERS) ×1
CATH FOLEY 2WAY SLVR 5CC 16FR (CATHETERS) ×1 IMPLANT
CATH INTERMIT  6FR 70CM (CATHETERS) ×2 IMPLANT
CLOTH BEACON ORANGE TIMEOUT ST (SAFETY) ×2 IMPLANT
GLOVE BIOGEL M 8.0 STRL (GLOVE) ×2 IMPLANT
GOWN STRL REUS W/ TWL XL LVL3 (GOWN DISPOSABLE) ×1 IMPLANT
GOWN STRL REUS W/TWL XL LVL3 (GOWN DISPOSABLE) ×2
GUIDEWIRE STR DUAL SENSOR (WIRE) ×2 IMPLANT
MANIFOLD NEPTUNE II (INSTRUMENTS) ×2 IMPLANT
PACK CYSTO (CUSTOM PROCEDURE TRAY) ×2 IMPLANT
STENT URET 6FRX24 CONTOUR (STENTS) ×2 IMPLANT
SYRINGE 20CC LL (MISCELLANEOUS) ×1 IMPLANT
SYRINGE IRR TOOMEY STRL 70CC (SYRINGE) ×1 IMPLANT
TUBING CONNECTING 10 (TUBING) ×2 IMPLANT

## 2015-08-10 NOTE — Op Note (Signed)
PATIENT:  [name] [name]  PRE-OPERATIVE DIAGNOSIS: 1. Bilateral ureteral stones 2. Acute renal failure with hyperkalemia 3. Bilateral renal stones  POST-OPERATIVE DIAGNOSIS: 1. Bilateral ureteral stones 2. Acute renal failure with hyperkalemia 3. Bilateral renal stones 4. Bladder calculi  PROCEDURE: 1. Cystoscopy with bilateral retrograde pyelograms including interpretation. 2. Bilateral double-J stent placement 3. Cystolitholapaxy and removal of bladder calculi.  SURGEON:  Claybon Jabs  INDICATION: [name] [name] is a 81 year old male with bilateral nonobstructing renal calculi. He underwent evaluation and was found to have a creatinine of greater than 10 and a potassium of 6.6. A CT scan was obtained which revealed bilateral, nonobstructing renal calculi as well as bilateral proximal ureteral calculi with some mild hydronephrosis and also incidentally noted bladder calculi. He is brought to the operating room today for stent placement bilaterally followed by medical management of his electrolyte abnormalities and eventual management of his ureteral calculi.  ANESTHESIA:  General  EBL:  Minimal  DRAINS: 6 French, 24 cm double-J stents in right and left ureters.  LOCAL MEDICATIONS USED:  None  SPECIMEN:  Bladder stones given to patient  Description of procedure: After informed consent the patient was taken to the operating room and placed on the table in a supine position. General anesthesia was then administered. Once fully anesthetized the patient was moved to the dorsal lithotomy position and the genitalia were sterilely prepped and draped in standard fashion. An official timeout was then performed.  The 21 French cystoscope with 30 lens was then passed down the urethra under direct vision. This is noted be normal. The prostate revealed bilobar hypertrophy with a median lobe component. The bladder was entered and noted to have 2+ trabeculation. A full and systematic inspection of the  bladder revealed no tumors or inflammatory lesions. Ureteral orifices were noted to be of normal configuration and position. Multiple bladder calculi were noted on the floor of the bladder and photographed.  I initially used the [place] syringe to irrigate the bladder and remove many of the small bladder calculi. I then proceeded with retrograde pyelography and stent placement.  A 6 French open-ended ureteral catheter was passed through the cystoscope and into the left ureteral orifice. Full-strength contrast was then injected under direct fluoroscopy up the left ureter which was noted to be slightly tortuous with some J hooking at the bladder level. A filling defect in the proximal ureter was identified consistent with the known ureteral calculus and renal calculi were seen as filling defects faintly as the contrast material used was unfortunately of a lesser concentration now mandated by the hospital. I then passed a 0.038 inch floppy-tipped guidewire through the open-ended catheter and into the area the renal pelvis. The open-ended catheter was then removed and the double-J stent was then passed over the guidewire into the area the renal pelvis and as I removed the guidewire good curl was noted in the renal pelvis and in the bladder.  A right retrograde pyelogram was then performed an identical fashion. This revealed some J hooking at the bladder, some tortuosity and again a filling defect in the proximal ureter as noted on previous imaging with some dilation of the collecting system and what appeared to be possible defects again difficult to discern due to the poor quality of contrast. I passed the guidewire in identical fashion and then passed the stent in an identical fashion with good curl again noted in the renal pelvis and bladder.  I then switched to the lithotrite and fragmented  the remaining jack stone that was present in the bladder and flushed this out with the [facility] syringe. Reinspection  revealed the stones at been removed and no injury to the bladder I therefore drained the bladder, inserted a 16 French Foley catheter and the patient was awakened and taken to the recovery room in stable and satisfactory condition. He tolerated procedure well no intraoperative complications. PLAN OF CARE: Discharge to home after PACU  PATIENT DISPOSITION:  PACU - hemodynamically stable.

## 2015-08-10 NOTE — Transfer of Care (Signed)
Immediate Anesthesia Transfer of Care Note  Patient: [name] [name]  Procedure(s) Performed: Procedure(s): CYSTOSCOPY WITH BILATERAL RETROGRADE PYELOGRAM/BILATERAL URETERAL STENT PLACEMENT (Bilateral)  Patient Location: PACU  Anesthesia Type:General  Level of Consciousness: awake, alert  and oriented  Airway & Oxygen Therapy: Patient Spontanous Breathing and Patient connected to face mask oxygen  Post-op Assessment: Report given to RN and Post -op Vital signs reviewed and stable  Post vital signs: Reviewed and stable  Last Vitals:  Filed Vitals:   08/10/15 1352 08/10/15 1418  BP: 147/50 153/68  Pulse: 87 85  Temp:    Resp: 18 16    Last Pain: There were no vitals filed for this visit.       Complications: No apparent anesthesia complications

## 2015-08-10 NOTE — ED Notes (Signed)
Per [facility] check CBG at 1440.

## 2015-08-10 NOTE — Discharge Instructions (Signed)

## 2015-08-10 NOTE — ED Notes (Signed)
Per pt, states he PCP told him to come to ED for low Potassium and abnormal creatine

## 2015-08-10 NOTE — ED Provider Notes (Signed)
CSN: [id number]     Arrival date & time 08/10/15  1148 History   First MD Initiated Contact with Patient 08/10/15 1151     Chief Complaint  Patient presents with  . low potassium     HPI  [name] [name] is an 81 y.o. male with history of HTN, DM, BPH who presents to the ED for evaluation of hypokalemia and elevated creatinine. He was sent here by Dr. Karsten Ro (urology). Pt states he was evaluated for generalized abdominal pain, N/V/D two days ago and diagnosed with kidney stones. EMR review reveals he was found to have bilateral staghorn calculi and was directed to come to [facility] on 5/25. He states he decided to see his urologist, Dr. Karsten Ro, yesterday instead. He states that today he received a call to come to the ED due to his labs. At this time pt states he feels a bit queasy but otherwise unremarkable. He reports three days of generalized mild abdominal pain with associated nausea and NBNB emesis. States he has had loose stools. Denies BRBPR or black, tarry stools. He states he has trouble urinating in that he can only urinate small amounts at a time but this is not new for him. Denies new dysuria or hematuria. He denies any chest pain or SOB. Denies new pain or muscle weakness. He states he has never had any heart issues. Pt states that at yesterday's visit he and Dr. Karsten Ro decided to keep a watchful eye on his kidney stones and defer further treatment at this time.  Past Medical History  Diagnosis Date  . Diabetes mellitus without complication ([place])   . Prostate disease   . Renal disorder    History reviewed. No pertinent past surgical history. No family history on file. Social History  Substance Use Topics  . Smoking status: Never Smoker   . Smokeless tobacco: None  . Alcohol Use: No    Review of Systems  All other systems reviewed and are negative.     Allergies  Review of patient's allergies indicates no known allergies.  Home Medications   Prior to Admission medications    Medication Sig Start Date End Date Taking? Authorizing Provider  ASPIRIN PO Take by mouth.    Historical Provider, MD  cephALEXin (KEFLEX) 500 MG capsule Take 1 capsule (500 mg total) by mouth 4 (four) times daily. 08/15/12   Janne Napoleon, NP  lisinopril (PRINIVIL,ZESTRIL) 20 MG tablet Take 20 mg by mouth daily.    Historical Provider, MD  METFORMIN HCL PO Take by mouth.    Historical Provider, MD  simvastatin (ZOCOR) 20 MG tablet Take 20 mg by mouth daily.    Historical Provider, MD  tamsulosin (FLOMAX) 0.4 MG CAPS Take by mouth.    Historical Provider, MD   BP 160/86 mmHg  Pulse 91  Temp(Src) 97.4 F (36.3 C) (Oral)  Resp 18  SpO2 100% Physical Exam  Constitutional: He is oriented to person, place, and time.  HENT:  Right Ear: External ear normal.  Left Ear: External ear normal.  Nose: Nose normal.  Mouth/Throat: No oropharyngeal exudate.  MM dry  Eyes: Conjunctivae and EOM are normal. Pupils are equal, round, and reactive to light.  Neck: Normal range of motion. Neck supple.  Cardiovascular: Normal rate, regular rhythm, normal heart sounds and intact distal pulses.   Pulmonary/Chest: Effort normal and breath sounds normal. No respiratory distress. He has no wheezes. He exhibits no tenderness.  Abdominal: Soft. Bowel sounds are normal. He exhibits no distension.  There is no tenderness. There is no rebound and no guarding.  No CVA tenderness  Musculoskeletal: He exhibits no edema.  Neurological: He is alert and oriented to person, place, and time. No cranial nerve deficit.  Bilateral hand tremors (pt states is baseline)  Skin: Skin is warm and dry.  Psychiatric: He has a normal mood and affect.  Nursing note and vitals reviewed.   ED Course  Procedures (including critical care time) Labs Review Labs Reviewed  COMPREHENSIVE METABOLIC PANEL - Abnormal; Notable for the following:    Sodium 132 (*)    Potassium 6.5 (*)    CO2 10 (*)    Glucose, Bld 105 (*)    Creatinine, Ser  10.71 (*)    Calcium 8.8 (*)    Total Protein 6.4 (*)    Total Bilirubin 1.3 (*)    GFR calc non Af Amer 4 (*)    GFR calc Af Amer 5 (*)    All other components within normal limits  CBC WITH DIFFERENTIAL/PLATELET - Abnormal; Notable for the following:    WBC 11.2 (*)    Neutro Abs 9.1 (*)    All other components within normal limits  LIPASE, BLOOD - Abnormal; Notable for the following:    Lipase 60 (*)    All other components within normal limits  URINALYSIS, ROUTINE W REFLEX MICROSCOPIC (NOT AT [facility])  Randolm Idol, ED    Imaging Review Ct Renal Stone Study  08/10/2015  CLINICAL DATA:  history of HTN, DM, BPH who presents to the ED for evaluation of hypokalemia and elevated creatinine. generalized abdominal pain, N/V/D two days ago and diagnosed with kidney stones. bilateral staghorn calculi and was directed to come [facility] on 5/25. He states he decided to see his urologist, Dr. Karsten Ro, yesterday instead. He states that today he received a call to come to the ED due to his labs. At this time pt states he feels a bit queasy but otherwise unremarkable. He reports three days of generalized mild abdominal pain with associated nausea and NBNB emesis. States he has had loose stools. Denies BRBPR or black, tarry stools. He states he has trouble urinating in that he can only urinate small amounts at a time but this is not new for him. Denies new dysuria or hematuria. He denies any chest pain or SOB. Denies new pain or muscle weakness. He states he has never had any heart issues. Pt states that at yesterday's visit he and Dr. Karsten Ro decided to keep a watchful eye on his kidney stones and defer further treatment at this time. low potassium and abnormal creatine results--"patient stated that these symptoms started 5 days ago, n/v/d and dysuria, but can not say if one side or flank area hurts worse than other one.Hx of diabetes, prostate disease, and renal disorder EXAM: CT ABDOMEN AND PELVIS WITHOUT  CONTRAST TECHNIQUE: Multidetector CT imaging of the abdomen and pelvis was performed following the standard protocol without IV contrast. COMPARISON:  08/08/2015 and previous FINDINGS: Lower chest: Scattered coronary calcifications. Linear scarring or subsegmental atelectasis in the visualized lung bases. Hepatobiliary: No mass visualized on this un-enhanced exam. Pancreas: Moderate atrophy without focal lesion or ductal dilatation. Spleen: Within normal limits in size. Adrenals/Urinary Tract: Normal adrenals. Bilateral renal calculi, largest on the left in the lower pole extending centrally measuring at least 4.2 cm, on the right in the lower pole 2.3. Mild Right hydronephrosis and proximal ureterectasis to the level of a 8 mm proximal ureteral calculus. Moderate  left hydronephrosis and proximal ureterectasis down to the level of a 12 mm proximal ureteral calculus. There is a cluster of small calculi near the level of the left ureteral orifice in the urinary bladder. Urinary bladder is nondistended. Stomach/Bowel: No evidence of obstruction, inflammatory process, or abnormal fluid collections. Normal appendix. Scattered descending and sigmoid colon diverticula without adjacent inflammatory/edematous change or abscess. Vascular/Lymphatic: Scattered aortoiliac arterial calcifications without aneurysm. No adenopathy localized. Reproductive: Moderate prostatic enlargement. Other: No ascites.  No free air. Musculoskeletal: Degenerative disc disease in the visualized lower thoracic spine and L2-S1. Right hip arthroplasty, hardware incompletely visualized. IMPRESSION: 1. Bilateral nephrolithiasis and obstructing proximal ureteral calculi with hydronephrosis. 2. Descending and sigmoid diverticulosis. 3. Atherosclerosis, including aortoiliac and coronary artery disease. Please note that although the presence of coronary artery calcium documents the presence of coronary artery disease, the severity of this disease and any  potential stenosis cannot be assessed on this non-gated CT examination. Assessment for potential risk factor modification, dietary therapy or pharmacologic therapy may be warranted, if clinically indicated. Electronically Signed   By: Lucrezia Europe M.D.   On: 08/10/2015 14:22   I have personally reviewed and evaluated these images and lab results as part of my medical decision-making.   EKG Interpretation   Date/Time:  Saturday Aug 10 2015 12:00:14 EDT Ventricular Rate:  91 PR Interval:    QRS Duration: 167 QT Interval:  399 QTC Calculation: 491 R Axis:   -94 Text Interpretation:  Normal sinus rhythm Right bundle branch block  Confirmed by CAMPOS  MD, Lennette Bihari ([id number]) on 08/10/2015 12:04:20 PM      MDM   Final diagnoses:  Acute renal failure, unspecified acute renal failure type (HCC)  Staghorn renal calculus  Hyperkalemia    EKG with NSR, RBBB. Trop neg. CBC with leukocytosis of 11.2. CMP reveals hyperkalemia of 6.5, Cr 10.71, Na 132. Lipase 60. This ARF is new for pt. He does not have a h/o renal insufficiency. In addition to his recent abdominal symptoms and N/V/D, however, he is on lisinopril. Fluids, lasix, insulin, and dextrose started in the ED for hypokalemia. Dr. Venora Maples to do bedside ultrasound to assess for obstruction. Will obtain CT renal study.   Per Dr. Venora Maples, bedside ultrasound not concerning for obstruction. Will obtain post void residual bladder scan.  CT reveals bilateral obstructing ureteral calculi and bilateral nephrolithiasis with hydronephrosis. Dr. Karsten Ro spoke with Dr. Venora Maples. Will plan to come see in the ED and plan for stent placement. We will call the hospitalist team for a medical admission.   I spoke to Dr. Denton Brick who will admit pt to the hospitalist service. Pt is out of the ED and on his way to the OR.  Anne Ng, PA-C 08/10/15 [place], MD 08/10/15 [phone]

## 2015-08-10 NOTE — Anesthesia Postprocedure Evaluation (Signed)
Anesthesia Post Note  Patient: BREVAN DIMATTIA  Procedure(s) Performed: Procedure(s) (LRB): CYSTOSCOPY WITH BILATERAL RETROGRADE PYELOGRAM/BILATERAL URETERAL STENT PLACEMENT (Bilateral)  Patient location during evaluation: PACU Anesthesia Type: General Level of consciousness: awake and alert Pain management: pain level controlled Vital Signs Assessment: post-procedure vital signs reviewed and stable Respiratory status: spontaneous breathing, nonlabored ventilation and respiratory function stable Cardiovascular status: blood pressure returned to baseline and stable Postop Assessment: no signs of nausea or vomiting Anesthetic complications: no    Last Vitals:  Filed Vitals:   08/10/15 1800 08/10/15 1805  BP: 165/60 147/55  Pulse: 87   Temp: 36.5 C   Resp: 16     Last Pain:  Filed Vitals:   08/10/15 1845  PainSc: 0-No pain                 Geanie Pacifico A

## 2015-08-10 NOTE — ED Notes (Signed)
PT have been made aware of urine sample 

## 2015-08-10 NOTE — H&P (Signed)
Patient Demographics:    [name] [name], is a 81 y.o. male  MRN: [id number]   DOB - [birth year]  Admit Date - 08/10/2015  Outpatient Primary MD for the patient is Harvie Junior, MD   Assessment & Plan:    Active Problems:   Hyperkalemia   Acute renal failure (HCC)   Acute bilateral obstructive uropathy   HTN (hypertension)   Diabetes mellitus, type 2 (HCC)  1)Acute renal failure and bilateral staghorn calculi- abdominal imaging studies suggest obstructive uropathy with hydronephrosis. Creatinine over 10, potassium 6.5, His hyperkalemia was treated in the emergency department with insulin, D50, Lasix, IV fluids. Discussed with  Dr. Karsten Ro, who took pt emergently to the operating room for bilateral ureteral stents as well as  Cystoscopy with bilateral retrograde pyelograms including interpretation,  Bilateral double-J stent placement and  Cystolitholapaxy and removal of bladder calculi. Foley bag with hematuria at this time. Stop metformin, stop lisinopril HCTZ, stop NSAIDs. Continue Flomax for BPH and also to help with the passage of stones. Baseline renal function/labs not available, consider nephrology consult if renal function does not improve as anticipated after relief of obstructive uropathy and with hydration as well as  with medication changes outlined above. Continue to avoid nephrotoxic agents and maintain adequate hydration. Admit to telemetry unit due to hyperkalemia  2)Hyperkalemia- due to acute renal failure in the setting of lisinopril use,  repeat potassium is down to 5 , hold lisinopril . Give Kayexalate if repeat potassium becomes elevated again   3)HTN- hold lisinopril/ HCTZ until patient is euvolemic and renal function recovers, patient is probably volume depleted due to recent episodes of nausea  vomiting diarrhea  may use IV Hydralazine 10 mg  Every 4 hours Prn for systolic blood pressure over 160 mmhg  4)DM- hold metformin due to acute renal failure with increased risk for lactic acidosis, Use Novolog/Humalog Sliding scale insulin with Accu-Cheks/Fingersticks as ordered   5)N/V/D- patient is probably volume depleted due to recent episodes of nausea vomiting diarrhea, GI symptoms are resolving, push fluids   With History of - Reviewed by me  Past Medical History  Diagnosis Date  . Diabetes mellitus without complication ([place])   . Prostate disease   . Renal disorder       History reviewed. No pertinent past surgical history.    Chief Complaint  Patient presents with  . Abnormal Lab      HPI:    [name] [name]  is a 82 y.o. male,  With past medical history relevant for hypertension, BPH and diabetes who was seen for nausea and vomiting and diarrhea on 08/08/2015, KUB was obtained on the same day as part of evaluation of his GI complaint ,  KUB on 08/08/15 revealed bilateral partial staghorn calculi with a laminated stone in the right kidney measuring 2.2 cm and a large partial staghorn in the left kidney measuring 3.14 cm with multiple renal calculi noted  in the lower pole as well.   Patient was seen by  Dr. Karsten Ro the urologist on 08/09/2015, labs were done by urologist . Patient got a call back from the urology office today asking to come to the ED due to elevated potassium levels and abnormal renal labs. Patient's potassium is 6.5, creatinines over 10 . No baseline renal values is available . Patient denies hematuria or dysuria, no prior history of kidney stones, no flank pain. No fevers or chills.  Diarrhea has gotten better. Diarrhea was without blood or mucus, emesis was without bile or blood.   ED Course-in the ED patient was treated with IV dextrose and insulin as well as IV fluids and Lasix. Dr.  Karsten Ro took him to the OR and placed bilateral ureteral stents on 08/10/2015.     Review of systems:    In addition to the HPI above,   A full 12 point Review of Systems was done, except as stated above, all other Review of Systems were negative.    Social History:  Reviewed by me    Social History  Substance Use Topics  . Smoking status: Never Smoker   . Smokeless tobacco: Not on file  . Alcohol Use: No       Family History :  Reviewed by me   Negative for urological malignancies in first-degree relatives   Home Medications:   Prior to Admission medications   Medication Sig Start Date End Date Taking? Authorizing Provider  aspirin EC 81 MG tablet Take 81 mg by mouth daily.   Yes Historical Provider, MD  lisinopril-hydrochlorothiazide Reita May) 20-12.5 MG tablet  07/02/15  Yes Historical Provider, MD  metFORMIN (GLUCOPHAGE) 1000 MG tablet  08/07/15  Yes Historical Provider, MD  naproxen sodium (ANAPROX) 220 MG tablet Take 220 mg by mouth 2 (two) times daily with a meal.   Yes Historical Provider, MD  simvastatin (ZOCOR) 20 MG tablet Take 20 mg by mouth at bedtime.    Yes Historical Provider, MD  tamsulosin (FLOMAX) 0.4 MG CAPS Take 0.4 mg by mouth 2 (two) times daily with a meal.    Yes Historical Provider, MD     Allergies:    No Known Allergies   Physical Exam:   Vitals  Blood pressure 94/68, pulse 86, temperature 97.6 F (36.4 C), temperature source Oral, resp. rate 16, SpO2 97 %.  Physical Examination: General appearance - alert, well appearing, and in no distress and  Mental status - alert, oriented to person, place, and time,  Eyes - sclera anicteric Neck - supple, no JVD elevation , Chest - clear  to auscultation bilaterally, symmetrical air movement,  Heart - S1 and S2 normal,  Abdomen - soft, nontender, nondistended, no masses or organomegaly, no CVA area tenderness Neurological - screening mental status exam normal, neck supple without rigidity, cranial nerves II through XII intact, DTR's normal and  symmetric Extremities - no pedal edema noted, intact peripheral pulses  Skin - warm, dry GU- Foley catheter with bloody urine   Data Review:    CBC  Recent Labs Lab 08/10/15 1207  WBC 11.2*  HGB 14.2  HCT 41.0  PLT 229  MCV 93.0  MCH 32.2  MCHC 34.6  RDW 13.1  LYMPHSABS 1.3  MONOABS 0.6  EOSABS 0.1  BASOSABS 0.0   ------------------------------------------------------------------------------------------------------------------  Chemistries   Recent Labs Lab 08/10/15 1207  NA 132*  K 6.5*  CL 108  CO2 10*  GLUCOSE 105*  BUN 107*  CREATININE 10.71*  CALCIUM 8.8*  AST 27  ALT 27  ALKPHOS 85  BILITOT 1.3*   ------------------------------------------------------------------------------------------------------------------ CrCl cannot be calculated (Unknown ideal weight.). ------------------------------------------------------------------------------------------------------------------ No results for input(s): TSH, T4TOTAL, T3FREE, THYROIDAB in the last 72 hours.  Invalid input(s): FREET3   Coagulation profile No results for input(s): INR, PROTIME in the last 168 hours. ------------------------------------------------------------------------------------------------------------------- No results for input(s): DDIMER in the last 72 hours. -------------------------------------------------------------------------------------------------------------------  Cardiac Enzymes No results for input(s): CKMB, TROPONINI, MYOGLOBIN in the last 168 hours.  Invalid input(s): CK ------------------------------------------------------------------------------------------------------------------ No results found for: BNP   ---------------------------------------------------------------------------------------------------------------  Urinalysis    Component Value Date/Time   COLORURINE YELLOW 08/10/2015 [place] 08/10/2015 1442   LABSPEC 1.013 08/10/2015  1442   PHURINE 5.0 08/10/2015 1442   GLUCOSEU NEGATIVE 08/10/2015 1442   HGBUR MODERATE* 08/10/2015 1442   BILIRUBINUR NEGATIVE 08/10/2015 1442   KETONESUR NEGATIVE 08/10/2015 1442   PROTEINUR NEGATIVE 08/10/2015 1442   UROBILINOGEN 0.2 03/14/2009 1129   NITRITE NEGATIVE 08/10/2015 1442   LEUKOCYTESUR SMALL* 08/10/2015 1442    ----------------------------------------------------------------------------------------------------------------   Imaging Results:    Dg Chest Portable 1 View  08/10/2015  CLINICAL DATA:  Preoperative evaluation. EXAM: PORTABLE CHEST 1 VIEW COMPARISON:  08/08/2015 FINDINGS: Cardiac shadow is stable. The lungs are well aerated bilaterally. No focal infiltrate or sizable effusion is seen. Minimal scarring is again noted in the bases bilaterally. No acute bony abnormality is seen. IMPRESSION: No active disease. Electronically Signed   By: Inez Catalina M.D.   On: 08/10/2015 17:26   Ct Renal Stone Study  08/10/2015  CLINICAL DATA:  history of HTN, DM, BPH who presents to the ED for evaluation of hypokalemia and elevated creatinine. generalized abdominal pain, N/V/D two days ago and diagnosed with kidney stones. bilateral staghorn calculi and was directed to come to [facility] on 5/25. He states he decided to see his urologist, Dr. Karsten Ro, yesterday instead. He states that today he received a call to come to the ED due to his labs. At this time pt states he feels a bit queasy but otherwise unremarkable. He reports three days of generalized mild abdominal pain with associated nausea and NBNB emesis. States he has had loose stools. Denies BRBPR or black, tarry stools. He states he has trouble urinating in that he can only urinate small amounts at a time but this is not new for him. Denies new dysuria or hematuria. He denies any chest pain or SOB. Denies new pain or muscle weakness. He states he has never had any heart issues. Pt states that at yesterday's visit he and Dr. Karsten Ro  decided to keep a watchful eye on his kidney stones and defer further treatment at this time. low potassium and abnormal creatine results--"patient stated that these symptoms started 5 days ago, n/v/d and dysuria, but can not say if one side or flank area hurts worse than other one.Hx of diabetes, prostate disease, and renal disorder EXAM: CT ABDOMEN AND PELVIS WITHOUT CONTRAST TECHNIQUE: Multidetector CT imaging of the abdomen and pelvis was performed following the standard protocol without IV contrast. COMPARISON:  08/08/2015 and previous FINDINGS: Lower chest: Scattered coronary calcifications. Linear scarring or subsegmental atelectasis in the visualized lung bases. Hepatobiliary: No mass visualized on this un-enhanced exam. Pancreas: Moderate atrophy without focal lesion or ductal dilatation. Spleen: Within normal limits in size. Adrenals/Urinary Tract: Normal adrenals. Bilateral renal calculi, largest on the left in the lower pole extending centrally measuring at least 4.2 cm, on the right in the lower pole  2.3. Mild Right hydronephrosis and proximal ureterectasis to the level of a 8 mm proximal ureteral calculus. Moderate left hydronephrosis and proximal ureterectasis down to the level of a 12 mm proximal ureteral calculus. There is a cluster of small calculi near the level of the left ureteral orifice in the urinary bladder. Urinary bladder is nondistended. Stomach/Bowel: No evidence of obstruction, inflammatory process, or abnormal fluid collections. Normal appendix. Scattered descending and sigmoid colon diverticula without adjacent inflammatory/edematous change or abscess. Vascular/Lymphatic: Scattered aortoiliac arterial calcifications without aneurysm. No adenopathy localized. Reproductive: Moderate prostatic enlargement. Other: No ascites.  No free air. Musculoskeletal: Degenerative disc disease in the visualized lower thoracic spine and L2-S1. Right hip arthroplasty, hardware incompletely visualized.  IMPRESSION: 1. Bilateral nephrolithiasis and obstructing proximal ureteral calculi with hydronephrosis. 2. Descending and sigmoid diverticulosis. 3. Atherosclerosis, including aortoiliac and coronary artery disease. Please note that although the presence of coronary artery calcium documents the presence of coronary artery disease, the severity of this disease and any potential stenosis cannot be assessed on this non-gated CT examination. Assessment for potential risk factor modification, dietary therapy or pharmacologic therapy may be warranted, if clinically indicated. Electronically Signed   By: Lucrezia Europe M.D.   On: 08/10/2015 14:22    Radiological Exams on Admission: Dg Chest Portable 1 View  08/10/2015  CLINICAL DATA:  Preoperative evaluation. EXAM: PORTABLE CHEST 1 VIEW COMPARISON:  08/08/2015 FINDINGS: Cardiac shadow is stable. The lungs are well aerated bilaterally. No focal infiltrate or sizable effusion is seen. Minimal scarring is again noted in the bases bilaterally. No acute bony abnormality is seen. IMPRESSION: No active disease. Electronically Signed   By: Inez Catalina M.D.   On: 08/10/2015 17:26   Ct Renal Stone Study  08/10/2015  CLINICAL DATA:  history of HTN, DM, BPH who presents to the ED for evaluation of hypokalemia and elevated creatinine. generalized abdominal pain, N/V/D two days ago and diagnosed with kidney stones. bilateral staghorn calculi and was directed to come to [facility] on 5/25. He states he decided to see his urologist, Dr. Karsten Ro, yesterday instead. He states that today he received a call to come to the ED due to his labs. At this time pt states he feels a bit queasy but otherwise unremarkable. He reports three days of generalized mild abdominal pain with associated nausea and NBNB emesis. States he has had loose stools. Denies BRBPR or black, tarry stools. He states he has trouble urinating in that he can only urinate small amounts at a time but this is not new for him.  Denies new dysuria or hematuria. He denies any chest pain or SOB. Denies new pain or muscle weakness. He states he has never had any heart issues. Pt states that at yesterday's visit he and Dr. Karsten Ro decided to keep a watchful eye on his kidney stones and defer further treatment at this time. low potassium and abnormal creatine results--"patient stated that these symptoms started 5 days ago, n/v/d and dysuria, but can not say if one side or flank area hurts worse than other one.Hx of diabetes, prostate disease, and renal disorder EXAM: CT ABDOMEN AND PELVIS WITHOUT CONTRAST TECHNIQUE: Multidetector CT imaging of the abdomen and pelvis was performed following the standard protocol without IV contrast. COMPARISON:  08/08/2015 and previous FINDINGS: Lower chest: Scattered coronary calcifications. Linear scarring or subsegmental atelectasis in the visualized lung bases. Hepatobiliary: No mass visualized on this un-enhanced exam. Pancreas: Moderate atrophy without focal lesion or ductal dilatation. Spleen: Within normal limits in size.  Adrenals/Urinary Tract: Normal adrenals. Bilateral renal calculi, largest on the left in the lower pole extending centrally measuring at least 4.2 cm, on the right in the lower pole 2.3. Mild Right hydronephrosis and proximal ureterectasis to the level of a 8 mm proximal ureteral calculus. Moderate left hydronephrosis and proximal ureterectasis down to the level of a 12 mm proximal ureteral calculus. There is a cluster of small calculi near the level of the left ureteral orifice in the urinary bladder. Urinary bladder is nondistended. Stomach/Bowel: No evidence of obstruction, inflammatory process, or abnormal fluid collections. Normal appendix. Scattered descending and sigmoid colon diverticula without adjacent inflammatory/edematous change or abscess. Vascular/Lymphatic: Scattered aortoiliac arterial calcifications without aneurysm. No adenopathy localized. Reproductive: Moderate  prostatic enlargement. Other: No ascites.  No free air. Musculoskeletal: Degenerative disc disease in the visualized lower thoracic spine and L2-S1. Right hip arthroplasty, hardware incompletely visualized. IMPRESSION: 1. Bilateral nephrolithiasis and obstructing proximal ureteral calculi with hydronephrosis. 2. Descending and sigmoid diverticulosis. 3. Atherosclerosis, including aortoiliac and coronary artery disease. Please note that although the presence of coronary artery calcium documents the presence of coronary artery disease, the severity of this disease and any potential stenosis cannot be assessed on this non-gated CT examination. Assessment for potential risk factor modification, dietary therapy or pharmacologic therapy may be warranted, if clinically indicated. Electronically Signed   By: Lucrezia Europe M.D.   On: 08/10/2015 14:22    DVT Prophylaxis SCD  AM Labs Ordered, also please review Full Orders  Family Communication: Admission, patients condition and plan of care including tests being ordered have been discussed with the patient who indicate understanding and agree with the plan   Code Status - Full Code  Likely DC to  Home  Condition   fair  [name] [name] M.D on 08/10/2015 at 5:31 PM   Between 7am to 7pm - Pager - [phone]  After 7pm go to [email or web address] - password [email or web address]  [facility] - Office  [phone]  Dragon dictation system was used to create this note, attempts have been made to correct errors, however presence of uncorrected errors is not a reflection quality of care provided.

## 2015-08-10 NOTE — Anesthesia Preprocedure Evaluation (Addendum)
Anesthesia Evaluation  Patient identified by MRN, date of birth, ID band Patient awake    Reviewed: Allergy & Precautions, NPO status , Patient's Chart, lab work & pertinent test results  Airway Mallampati: II  TM Distance: >3 FB Neck ROM: Full    Dental  (+) Dental Advisory Given, Teeth Intact   Pulmonary    breath sounds clear to auscultation       Cardiovascular  Rhythm:Regular Rate:Normal     Neuro/Psych    GI/Hepatic   Endo/Other  diabetes, Well Controlled, Type 2, Oral Hypoglycemic AgentsMorbid obesity  Renal/GU      Musculoskeletal   Abdominal   Peds  Hematology   Anesthesia Other Findings   Reproductive/Obstetrics                           Anesthesia Physical Anesthesia Plan  ASA: IV  Anesthesia Plan: General   Post-op Pain Management:    Induction: Intravenous  Airway Management Planned: LMA  Additional Equipment:   Intra-op Plan:   Post-operative Plan: Extubation in OR  Informed Consent: I have reviewed the patients History and Physical, chart, labs and discussed the procedure including the risks, benefits and alternatives for the proposed anesthesia with the patient or authorized representative who has indicated his/her understanding and acceptance.   Dental advisory given  Plan Discussed with: CRNA, Surgeon and Anesthesiologist  Anesthesia Plan Comments: (NPO since 0830 light breakfast. Vomited yesterday but not today.  No nausea.  Will re check potassium and then treat as his level is quite elevated.)        Anesthesia Quick Evaluation

## 2015-08-10 NOTE — Anesthesia Procedure Notes (Signed)
Procedure Name: LMA Insertion Date/Time: 08/10/2015 3:44 PM Performed by: Glory Buff Pre-anesthesia Checklist: Patient identified, Emergency Drugs available, Suction available and Patient being monitored Patient Re-evaluated:Patient Re-evaluated prior to inductionOxygen Delivery Method: Circle system utilized Preoxygenation: Pre-oxygenation with 100% oxygen Intubation Type: IV induction Ventilation: Mask ventilation without difficulty LMA: LMA with gastric port inserted LMA Size: 4.0 Number of attempts: 1 Placement Confirmation: positive ETCO2 Tube secured with: Tape Dental Injury: Teeth and Oropharynx as per pre-operative assessment

## 2015-08-10 NOTE — Consult Note (Signed)
Physician requesting consultation: Dr. Marylene Buerger Reason for consultation: Bilateral obstructing ureteral calculi, ARF and hyperkalemia.  History of Present Illness He was having diarrhea and was seen for this and underwent a KUB 08/08/15 revealed bilateral partial staghorn calculi with a laminated stone in the right kidney measuring 2.2 cm and a large partial staghorn in the left kidney measuring 3.14 cm with multiple renal calculi noted in the lower pole as well. No ureteral stones were noted.  He reports that he has never had any flank pain or passed a kidney stone. He's never seen any gross hematuria. He has no history of recurrent UTIs. There is no family history of calculus disease  He does report having nocturia 4 for about 4 years associated with a weak urinary stream as well as some frequency and urgency in the daytime. He is currently taking tamsulosin 0.4 mg.  Interval History: He reported he was having difficulty with diarrhea as his primary complaint yesterday.  I received a call from the lab at 11:11 AM indicating that was a critical value from a hypercalciuria profile that I ordered yesterday. It was reported that his potassium was found to be 6.6 and was rechecked, reverified and found to not be associated with hemodialysis. In addition his creatinine was 10.53.  I called the patient and spoke to him. He seemed to be complaints. I gave him these values and asked him to right the potassium and creatinine values down a piece of paper and proceeded to the emergency room at rest. I told him of the critical need for evaluation with a creatinine of this level and the potential risks and complications associated with a high potassium such as his. He indicated he understood the severity and would proceed immediately to the emergency room.   He reported he was having some weakness but is not having any flank pain or hematuria.   Past Medical History Problems  1. History of arthritis  (Z87.39) 2. History of diabetes mellitus (Z86.39) 3. History of esophageal reflux (Z87.19) 4. History of hypertension (Z86.79)  Surgical History Problems  1. History of Total Hip Replacement  Current Meds 1. Aleve 220 MG Oral Capsule;  Therapy: (Recorded:26May2017) to Recorded 2. Aspirin 81 MG TABS;  Therapy: (Recorded:26May2017) to Recorded 3. Lisinopril TABS;  Therapy: (Recorded:26May2017) to Recorded 4. MetFORMIN HCl TABS;  Therapy: (Recorded:26May2017) to Recorded 5. Simvastatin TABS;  Therapy: (Recorded:26May2017) to Recorded 6. Tamsulosin HCl CAPS;  Therapy: (Recorded:26May2017) to Recorded  Allergies Medication  1. No Known Drug Allergies  Family History Problems  1. No pertinent family history : Mother  Social History Problems    Denied: History of Alcohol use   Caffeine use (F15.90)   Never a smoker   Retired   Single  Review of Systems Genitourinary, constitutional, skin, eye, otolaryngeal, hematologic/lymphatic, cardiovascular, pulmonary, endocrine, musculoskeletal, gastrointestinal, neurological and psychiatric system(s) were reviewed and pertinent findings if present are noted and are otherwise negative.  Genitourinary: urinary frequency, feelings of urinary urgency, nocturia, incontinence and weak urinary stream.  Gastrointestinal: nausea, heartburn and diarrhea.  Integumentary: pruritus.  Respiratory: cough.    Vitals Vital Signs [Data Includes: Last 1 Day]  Recorded: [id number] 09:39AM  Height: 5 ft 10 in Weight: 220 lb  BMI Calculated: 31.57 BSA Calculated: 2.17 Blood Pressure: 122 / 68 Heart Rate: 104 Recorded: [id number] 09:18AM  Height: 5 ft 10 in Weight: 240 lb  BMI Calculated: 34.44 BSA Calculated: 2.26 Blood Pressure: 147 / 67 Heart Rate: 94  Physical Exam Constitutional: Well  nourished and well developed . No acute distress.  ENT:. The ears and nose are normal in appearance.  Neck: The appearance of the neck is normal and no  neck mass is present.  Pulmonary: No respiratory distress and normal respiratory rhythm and effort.  Cardiovascular: Heart rate and rhythm are normal . No peripheral edema.  Abdomen: The abdomen is mildly obese. The abdomen is soft and nontender. No masses are palpated. No CVA tenderness. No hernias are palpable. No hepatosplenomegaly noted.  Rectal: Rectal exam demonstrates normal sphincter tone, no tenderness and no masses. Estimated prostate size is 2+. The prostate has no nodularity and is not tender. The left seminal vesicle is nonpalpable. The right seminal vesicle is nonpalpable. The perineum is normal on inspection.  Genitourinary: Examination of the penis demonstrates no discharge, no masses, no lesions and a normal meatus. The penis is circumcised. The scrotum is without lesions. The right epididymis is palpably normal and non-tender. The left epididymis is palpably normal and non-tender. The right testis is non-tender and without masses. The left testis is non-tender and without masses.  Lymphatics: The femoral and inguinal nodes are not enlarged or tender.  Skin: Normal skin turgor, no visible rash and no visible skin lesions.  Neuro/Psych:. Mood and affect are appropriate. He has a resting tremor in his upper extremities.    Results/Data Urine [Data Includes: Last 1 Day]   [id number]  COLOR STRAW   APPEARANCE CLEAR   SPECIFIC GRAVITY 1.010   pH 5.5   GLUCOSE NEGATIVE   BILIRUBIN NEGATIVE   KETONE TRACE   BLOOD 3+   PROTEIN NEGATIVE   NITRITE NEGATIVE   LEUKOCYTE ESTERASE 2+   SQUAMOUS EPITHELIAL/HPF NONE SEEN HPF  WBC 20-40 WBC/HPF  RBC 3-10 RBC/HPF  BACTERIA FEW HPF  CRYSTALS NONE SEEN HPF  CASTS NONE SEEN LPF  Yeast NONE SEEN HPF   Old records or history reviewed: ER notes as above.  The following images/tracing/specimen were independently visualized:  KUB as above.  The following clinical lab reports were reviewed:  UA: Red and white cells were noted with a few  bacteria.  Will request old records/history: His most recent creatinine results.  PVR: Ultrasound PVR 85 ml. He does not have a significantly elevated PVR.    Assessment Assessed  1. Nocturia (R35.1) 2. Pyuria (N39.0) 3. Renal calculus, bilateral (N20.0)  We discussed the management of urinary stones. These options include observation, ureteroscopy, shockwave lithotripsy, and PCNL. We discussed which options are relevant to these particular stones. We discussed the natural history of stones as well as the complications of untreated stones and the impact on quality of life without treatment as well as with each of the above listed treatments. We also discussed the efficacy of each treatment in its ability to clear the stone burden. With any of these management options I discussed the signs and symptoms of infection and the need for emergent treatment should these be experienced. For each option we discussed the ability of each procedure to clear the patient of their stone burden.    For observation I described the risks which include but are not limited to silent renal damage, life-threatening infection, need for emergent surgery, failure to pass stone, and pain.    For ureteroscopy I described the risks which include heart attack, stroke, pulmonary embolus, death, bleeding, infection, damage to contiguous structures, positioning injury, ureteral stricture, ureteral avulsion, ureteral injury, need for ureteral stent, inability to perform ureteroscopy, need for an interval procedure, inability to  clear stone burden, stent discomfort and pain.    For shockwave lithotripsy I described the risks which include arrhythmia, kidney contusion, kidney hemorrhage, need for transfusion, long-term risk of diabetes or hypertension, back discomfort, flank ecchymosis, flank abrasion, inability to break up stone, inability to pass stone fragments, Steinstrasse, infection associated with obstructing stones, need  for different surgical procedure and possible need for repeat shockwave lithotripsy.    For PCNL I described the risks including heart attack, sure, pulmonary embolus, death, positioning injury, pneumothorax, hydrothorax, need for chest tube, inability to clear stone burden, renal laceration, arterial venous fistula or malformation, need for embolization of kidney, loss of kidney or renal function, need for repeat procedure, need for prolonged nephrostomy tube, ureteral avulsion and fistula.    I told him that in this particular situation his stones were discovered incidentally and have been causing him absolutely no symptoms. He's never passed a stone is not having recurrent infections. Because of that there is not an absolute need to treat the stones unless they shouldn't cause obstruction, pain, hematuria or he should have significant worsening of his renal function. I will obtain his most recent creatinine and if this is normal he would not necessarily need to undergo any form of treatment. I have recommended however that he undergo stone risk evaluation in order to determine any potential cause for these stones which may have been present for a number of years.    He has moderate obstructive voiding symptoms on tamsulosin. He is taking 0.4 mg and what I've recommended initially is that he increase his dosage to 0.8 mg. I will then have him return to determine if this has resulted in improvement in his voiding symptoms.   Plan    1. Hypercalciuria profile.  2. 24-hour urine for stone risk.  3. Increased dose of tamsulosin 0.8 mg.  4. Culture urine  5. Return to go over the results of his stone risk analysis.   Addendum: The patient underwent a CT scan in the emergency room.  This revealed, in addition to his known bilateral, nonobstructing renal calculi, stone in the right and left proximal ureter with associated hydronephrosis.  It was my feeling that this was most certainly  contributing to and very possibly causing all of his acute renal insufficiency.  I have discussed with him the need for a procedure to unobstruct his kidneys.  Percutaneous nephrostomy tubes could be placed and would allow drainage of the kidneys as well as a route for further surgical procedures to be performed such as a percutaneous nephrolithotomy.  The problem is that this would necessitate him maintain bilateral nephrostomy tubes while his acute medical condition has improved and then over the weeks it would take to schedule, undergo surgery on one side and then eventually undergo surgery on the contralateral side.  Because we had decided to proceed in a more conservative fashion yesterday I also proposed placement of double-J stents in both ureters with the understanding that if his stones are impacted and do not allow passage of stones he still may end up with one possibly tow nephrostomy tubes.  I think placement of stents would be in his best interest for now and be much better tolerated the intermediate term.  I therefore have discussed the procedure of cystoscopy and double-J stent placement with him today in detail.  We discussed the risks and complications, the anticipated postoperative course and his questions have been answered to his satisfaction and he has elected to proceed.

## 2015-08-11 DIAGNOSIS — E875 Hyperkalemia: Secondary | ICD-10-CM

## 2015-08-11 DIAGNOSIS — E118 Type 2 diabetes mellitus with unspecified complications: Secondary | ICD-10-CM

## 2015-08-11 DIAGNOSIS — I10 Essential (primary) hypertension: Secondary | ICD-10-CM

## 2015-08-11 DIAGNOSIS — N139 Obstructive and reflux uropathy, unspecified: Secondary | ICD-10-CM

## 2015-08-11 LAB — BASIC METABOLIC PANEL
Anion gap: 9 (ref 5–15)
BUN: 88 mg/dL — ABNORMAL HIGH (ref 6–20)
CHLORIDE: 114 mmol/L — AB (ref 101–111)
CO2: 15 mmol/L — ABNORMAL LOW (ref 22–32)
CREATININE: 6.33 mg/dL — AB (ref 0.61–1.24)
Calcium: 8.6 mg/dL — ABNORMAL LOW (ref 8.9–10.3)
GFR calc non Af Amer: 8 mL/min — ABNORMAL LOW (ref >60)
GFR, EST AFRICAN AMERICAN: 9 mL/min — AB (ref >60)
Glucose, Bld: 196 mg/dL — ABNORMAL HIGH (ref 65–99)
POTASSIUM: 5 mmol/L (ref 3.5–5.1)
SODIUM: 138 mmol/L (ref 135–145)

## 2015-08-11 LAB — CBC
HEMATOCRIT: 39.6 % (ref 39.0–52.0)
Hemoglobin: 13.5 g/dL (ref 13.0–17.0)
MCH: 31.4 pg (ref 26.0–34.0)
MCHC: 34.1 g/dL (ref 30.0–36.0)
MCV: 92.1 fL (ref 78.0–100.0)
PLATELETS: 237 10*3/uL (ref 150–400)
RBC: 4.3 MIL/uL (ref 4.22–5.81)
RDW: 13.4 % (ref 11.5–15.5)
WBC: 10.5 10*3/uL (ref 4.0–10.5)

## 2015-08-11 LAB — GLUCOSE, CAPILLARY
GLUCOSE-CAPILLARY: 150 mg/dL — AB (ref 65–99)
GLUCOSE-CAPILLARY: 147 mg/dL — AB (ref 65–99)
Glucose-Capillary: 161 mg/dL — ABNORMAL HIGH (ref 65–99)
Glucose-Capillary: 144 mg/dL — ABNORMAL HIGH (ref 65–99)

## 2015-08-11 MED ORDER — SODIUM CHLORIDE 0.45 % IV SOLN
INTRAVENOUS | Status: DC
Start: 2015-08-11 — End: 2015-08-12
  Administered 2015-08-11: 16:00:00 via INTRAVENOUS

## 2015-08-11 MED ORDER — INSULIN DETEMIR 100 UNIT/ML ~~LOC~~ SOLN
5.0000 [IU] | Freq: Every day | SUBCUTANEOUS | Status: DC
  Administered 2015-08-11 – 2015-08-12 (×2): 5 [IU] via SUBCUTANEOUS
  Filled 2015-08-11 (×2): qty 0.05

## 2015-08-11 NOTE — Progress Notes (Signed)
[facility] PROGRESS NOTE    Progress Note  [name] [name]  TZG:[id number] DOB: 15-Jan-1935 DOA: 08/10/2015 PCP: Harvie Junior, MD     Brief Narrative:   [name] [name] is an 81 y.o. male past medical history unknown for hypertension BPH and diabetes who presents to the ED with nausea and diarrhea CT was on was obtained which revealed bilateral partial staghorn calculi, urology was consulted recommended surgical intervention, cystoscopy with bilateral retrograde pyelogram and bilateral double-J stent placement with Cystolitholapaxy and removal of bladder calculi.  Assessment/Plan:   Acute renal failure (HCC) due to Acute bilateral obstructive uropathy: Unknown baseline renal function. Status post cystoscopy with bilateral retrograde pyelogram and bilateral double J stent placement with calculi removal on 08/10/2015. Appreciate urology's assistance. Cont. hold NSAIDs, diuretics and ace inhibitors Changes IV fluids to half-normal saline.  b-met in am.  Hyperkalemia: Resolved with resolution of AKI.  Essential  HTN (hypertension): Continue hold antihypertensive medication.  Diabetes mellitus, type 2 ([place]) DC metformin due to acute renal failure agree with sliding scale insulin, start him on long-acting low-dose insulin   DVT prophylaxis: SCD's Family Communication:none Disposition Plan/Barrier to D/C: 1-2 days Code Status:     Code Status Orders        Start     Ordered   08/10/15 1820  Full code   Continuous     08/10/15 1819    Code Status History    Date Active Date Inactive Code Status Order ID Comments User Context   This patient has a current code status but no historical code status.        IV Access:    Peripheral IV   Procedures and diagnostic studies:   Dg Chest Portable 1 View  08/10/2015  CLINICAL DATA:  Preoperative evaluation. EXAM: PORTABLE CHEST 1 VIEW COMPARISON:  08/08/2015 FINDINGS: Cardiac shadow is stable. The lungs are well  aerated bilaterally. No focal infiltrate or sizable effusion is seen. Minimal scarring is again noted in the bases bilaterally. No acute bony abnormality is seen. IMPRESSION: No active disease. Electronically Signed   By: Inez Catalina M.D.   On: 08/10/2015 17:26   Ct Renal Stone Study  08/10/2015  CLINICAL DATA:  history of HTN, DM, BPH who presents to the ED for evaluation of hypokalemia and elevated creatinine. generalized abdominal pain, N/V/D two days ago and diagnosed with kidney stones. bilateral staghorn calculi and was directed to come [facility] on 5/25. He states he decided to see his urologist, Dr. Karsten Ro, yesterday instead. He states that today he received a call to come to the ED due to his labs. At this time pt states he feels a bit queasy but otherwise unremarkable. He reports three days of generalized mild abdominal pain with associated nausea and NBNB emesis. States he has had loose stools. Denies BRBPR or black, tarry stools. He states he has trouble urinating in that he can only urinate small amounts at a time but this is not new for him. Denies new dysuria or hematuria. He denies any chest pain or SOB. Denies new pain or muscle weakness. He states he has never had any heart issues. Pt states that at yesterday's visit he and Dr. Karsten Ro decided to keep a watchful eye on his kidney stones and defer further treatment at this time. low potassium and abnormal creatine results--"patient stated that these symptoms started 5 days ago, n/v/d and dysuria, but can not say if one side or flank area hurts worse than other  one.Hx of diabetes, prostate disease, and renal disorder EXAM: CT ABDOMEN AND PELVIS WITHOUT CONTRAST TECHNIQUE: Multidetector CT imaging of the abdomen and pelvis was performed following the standard protocol without IV contrast. COMPARISON:  08/08/2015 and previous FINDINGS: Lower chest: Scattered coronary calcifications. Linear scarring or subsegmental atelectasis in the visualized lung  bases. Hepatobiliary: No mass visualized on this un-enhanced exam. Pancreas: Moderate atrophy without focal lesion or ductal dilatation. Spleen: Within normal limits in size. Adrenals/Urinary Tract: Normal adrenals. Bilateral renal calculi, largest on the left in the lower pole extending centrally measuring at least 4.2 cm, on the right in the lower pole 2.3. Mild Right hydronephrosis and proximal ureterectasis to the level of a 8 mm proximal ureteral calculus. Moderate left hydronephrosis and proximal ureterectasis down to the level of a 12 mm proximal ureteral calculus. There is a cluster of small calculi near the level of the left ureteral orifice in the urinary bladder. Urinary bladder is nondistended. Stomach/Bowel: No evidence of obstruction, inflammatory process, or abnormal fluid collections. Normal appendix. Scattered descending and sigmoid colon diverticula without adjacent inflammatory/edematous change or abscess. Vascular/Lymphatic: Scattered aortoiliac arterial calcifications without aneurysm. No adenopathy localized. Reproductive: Moderate prostatic enlargement. Other: No ascites.  No free air. Musculoskeletal: Degenerative disc disease in the visualized lower thoracic spine and L2-S1. Right hip arthroplasty, hardware incompletely visualized. IMPRESSION: 1. Bilateral nephrolithiasis and obstructing proximal ureteral calculi with hydronephrosis. 2. Descending and sigmoid diverticulosis. 3. Atherosclerosis, including aortoiliac and coronary artery disease. Please note that although the presence of coronary artery calcium documents the presence of coronary artery disease, the severity of this disease and any potential stenosis cannot be assessed on this non-gated CT examination. Assessment for potential risk factor modification, dietary therapy or pharmacologic therapy may be warranted, if clinically indicated. Electronically Signed   By: Lucrezia Europe M.D.   On: 08/10/2015 14:22     Medical Consultants:     None.  Anti-Infectives:     Subjective:    [name] [name] no complains feels great.  Objective:    Filed Vitals:   08/10/15 1805 08/10/15 1841 08/10/15 2118 08/11/15 0613  BP: 147/55  109/55 118/61  Pulse:   77 85  Temp:   98.4 F (36.9 C) 97.7 F (36.5 C)  TempSrc:   Oral Oral  Resp:   18 20  Height:  5' 10"  (1.778 m)    Weight:  102 kg (224 lb 13.9 oz)    SpO2:   96% 98%    Intake/Output Summary (Last 24 hours) at 08/11/15 0921 Last data filed at 08/11/15 0700  Gross per 24 hour  Intake 3162.92 ml  Output   3250 ml  Net -87.08 ml   Filed Weights   08/10/15 1841  Weight: 102 kg (224 lb 13.9 oz)    Exam: General exam: In no acute distress.morbidly obese Respiratory system: Good air movement and clear to auscultation. Cardiovascular system: S1 & S2 heard, RRR.  Gastrointestinal system: Abdomen is nondistended, soft and nontender.  Central nervous system: Alert and oriented. No focal neurological deficits. Extremities: No pedal edema. Skin: No rashes, lesions or ulcers Psychiatry: Judgement and insight appear normal. Mood & affect appropriate.    Data Reviewed:    Labs: Basic Metabolic Panel:  Recent Labs Lab 08/10/15 1207 08/10/15 1835 08/11/15 0515  NA 132* 135 138  K 6.5* 5.3* 5.0  CL 108 111 114*  CO2 10* 12* 15*  GLUCOSE 105* 117* 196*  BUN 107* 105* 88*  CREATININE 10.71* 9.40* 6.33*  CALCIUM 8.8* 8.2* 8.6*   GFR Estimated Creatinine Clearance: 11.5 mL/min (by C-G formula based on Cr of 6.33). Liver Function Tests:  Recent Labs Lab 08/10/15 1207  AST 27  ALT 27  ALKPHOS 85  BILITOT 1.3*  PROT 6.4*  ALBUMIN 3.6    Recent Labs Lab 08/10/15 1207  LIPASE 60*   No results for input(s): AMMONIA in the last 168 hours. Coagulation profile No results for input(s): INR, PROTIME in the last 168 hours.  CBC:  Recent Labs Lab 08/10/15 1207 08/11/15 0515  WBC 11.2* 10.5  NEUTROABS 9.1*  --   HGB 14.2 13.5  HCT 41.0 39.6   MCV 93.0 92.1  PLT 229 237   Cardiac Enzymes: No results for input(s): CKTOTAL, CKMB, CKMBINDEX, TROPONINI in the last 168 hours. BNP (last 3 results) No results for input(s): PROBNP in the last 8760 hours. CBG:  Recent Labs Lab 08/10/15 1445 08/10/15 1644 08/10/15 2117 08/11/15 0758  GLUCAP 99 102* 181* 150*   D-Dimer: No results for input(s): DDIMER in the last 72 hours. Hgb A1c: No results for input(s): HGBA1C in the last 72 hours. Lipid Profile: No results for input(s): CHOL, HDL, LDLCALC, TRIG, CHOLHDL, LDLDIRECT in the last 72 hours. Thyroid function studies: No results for input(s): TSH, T4TOTAL, T3FREE, THYROIDAB in the last 72 hours.  Invalid input(s): FREET3 Anemia work up: No results for input(s): VITAMINB12, FOLATE, FERRITIN, TIBC, IRON, RETICCTPCT in the last 72 hours. Sepsis Labs:  Recent Labs Lab 08/10/15 1207 08/11/15 0515  WBC 11.2* 10.5   Microbiology No results found for this or any previous visit (from the past 240 hour(s)).   Medications:   . feeding supplement (ENSURE ENLIVE)  237 mL Oral BID BM  . folic acid  1 mg Oral Daily  . insulin aspart  0-5 Units Subcutaneous QHS  . insulin aspart  0-9 Units Subcutaneous TID WC  . pneumococcal 23 valent vaccine  0.5 mL Intramuscular Tomorrow-1000  . senna  1 tablet Oral BID  . simvastatin  20 mg Oral QHS  . sodium chloride flush  3 mL Intravenous Q12H  . sodium chloride flush  3 mL Intravenous Q12H  . tamsulosin  0.4 mg Oral BID WC  . thiamine  100 mg Oral Daily   Continuous Infusions: . sodium chloride 1,000 mL (08/10/15 1713)  . sodium chloride      Time spent: 25 min   LOS: 1 day   Charlynne Cousins  [facility] Pager [phone]  *Please refer to [email or web address], password [email or web address] to get updated schedule on who will round on this patient, as hospitalists switch teams weekly. If 7PM-7AM, please contact night-coverage at [email or web address], password [email or web address] for any overnight  needs.  08/11/2015, 9:21 AM

## 2015-08-11 NOTE — Progress Notes (Signed)
Patient ID: [name] [name], male   DOB: 1935-01-09, 81 y.o.   MRN: [id number] 1 Day Post-Op  Subjective: [name] [name] is doing well post bilateral stenting and removal of bladder stones yesterday.   His Cr has fallen to 6.63 from 10 and the potassium has normalized.  He has a history of BPH with BOO and is on tamsulosin but has a foley in post op.   He reports that he has an appetite for the first time in a weak.  He denies other complaints.  ROS:  Review of Systems  Constitutional: Negative for fever.  Gastrointestinal: Negative for nausea and abdominal pain.  Genitourinary: Negative for flank pain.    Anti-infectives: Anti-infectives    Start     Dose/Rate Route Frequency Ordered Stop   08/10/15 1516  ciprofloxacin (CIPRO) IVPB 200 mg     200 mg 100 mL/hr over 60 Minutes Intravenous 60 min pre-op 08/10/15 1516 08/10/15 1625      Current Facility-Administered Medications  Medication Dose Route Frequency Provider Last Rate Last Dose  . 0.9 %  sodium chloride infusion  1,000 mL Intravenous Continuous Jola Schmidt, MD 125 mL/hr at 08/10/15 1713 1,000 mL at 08/10/15 1713  . 0.9 %  sodium chloride infusion  250 mL Intravenous PRN Roxan Hockey, MD      . 0.9 %  sodium chloride infusion   Intravenous Continuous Roxan Hockey, MD   75 mL/hr at 08/10/15 1823  . acetaminophen (TYLENOL) tablet 650 mg  650 mg Oral Q6H PRN Roxan Hockey, MD       Or  . acetaminophen (TYLENOL) suppository 650 mg  650 mg Rectal Q6H PRN Courage Emokpae, MD      . albuterol (PROVENTIL) (2.5 MG/3ML) 0.083% nebulizer solution 2.5 mg  2.5 mg Nebulization Q2H PRN Courage Emokpae, MD      . feeding supplement (ENSURE ENLIVE) (ENSURE ENLIVE) liquid 237 mL  237 mL Oral BID BM Courage Emokpae, MD      . folic acid (FOLVITE) tablet 1 mg  1 mg Oral Daily Roxan Hockey, MD   1 mg at 08/10/15 2018  . insulin aspart (novoLOG) injection 0-5 Units  0-5 Units Subcutaneous QHS Roxan Hockey, MD   0 Units at 08/10/15 2157  .  insulin aspart (novoLOG) injection 0-9 Units  0-9 Units Subcutaneous TID WC Courage Emokpae, MD      . morphine 2 MG/ML injection 2 mg  2 mg Intravenous Q4H PRN Courage Emokpae, MD      . ondansetron (ZOFRAN) tablet 4 mg  4 mg Oral Q6H PRN Roxan Hockey, MD       Or  . ondansetron (ZOFRAN) injection 4 mg  4 mg Intravenous Q6H PRN Courage Emokpae, MD      . oxyCODONE (Oxy IR/ROXICODONE) immediate release tablet 5 mg  5 mg Oral Q4H PRN Courage Emokpae, MD      . pneumococcal 23 valent vaccine (PNU-IMMUNE) injection 0.5 mL  0.5 mL Intramuscular Tomorrow-1000 Courage Emokpae, MD      . polyethylene glycol (MIRALAX / GLYCOLAX) packet 17 g  17 g Oral Daily PRN Roxan Hockey, MD      . senna (SENOKOT) tablet 8.6 mg  1 tablet Oral BID Roxan Hockey, MD   8.6 mg at 08/10/15 2156  . simvastatin (ZOCOR) tablet 20 mg  20 mg Oral QHS Courage Emokpae, MD      . sodium chloride flush (NS) 0.9 % injection 3 mL  3 mL Intravenous Q12H Roxan Hockey, MD  0 mL at 08/10/15 2158  . sodium chloride flush (NS) 0.9 % injection 3 mL  3 mL Intravenous PRN Roxan Hockey, MD      . sodium chloride flush (NS) 0.9 % injection 3 mL  3 mL Intravenous Q12H Roxan Hockey, MD   3 mL at 08/10/15 2157  . tamsulosin (FLOMAX) capsule 0.4 mg  0.4 mg Oral BID WC Courage Emokpae, MD      . thiamine (VITAMIN B-1) tablet 100 mg  100 mg Oral Daily Roxan Hockey, MD   100 mg at 08/10/15 2018  . traZODone (DESYREL) tablet 50 mg  50 mg Oral QHS PRN Roxan Hockey, MD         Objective: Vital signs in last 24 hours: Temp:  [97.4 F (36.3 C)-98.4 F (36.9 C)] 97.7 F (36.5 C) (05/28 [id number]) Pulse Rate:  [77-91] 85 (05/28 0613) Resp:  [10-20] 20 (05/28 0613) BP: (91-165)/(50-86) 118/61 mmHg (05/28 0613) SpO2:  [96 %-100 %] 98 % (05/28 0613) Weight:  [102 kg (224 lb 13.9 oz)] 102 kg (224 lb 13.9 oz) (05/27 1841)  Intake/Output from previous day: 05/27 0701 - 05/28 0700 In: 3162.9 [P.O.:240; I.V.:2922.9] Out: 3250  [Urine:3250] Intake/Output this shift:     Physical Exam  Constitutional: He is well-developed, well-nourished, and in no distress.  Cardiovascular: Normal rate, regular rhythm and normal heart sounds.   Pulmonary/Chest: Effort normal and breath sounds normal. No respiratory distress.  Abdominal: Soft. He exhibits no distension and no mass. There is no tenderness. There is no guarding.  Genitourinary:  Foley is draining well and the urine is nearly clear.     Lab Results:   Recent Labs  08/10/15 1207 08/11/15 0515  WBC 11.2* 10.5  HGB 14.2 13.5  HCT 41.0 39.6  PLT 229 237   BMET  Recent Labs  08/10/15 1835 08/11/15 0515  NA 135 138  K 5.3* 5.0  CL 111 114*  CO2 12* 15*  GLUCOSE 117* 196*  BUN 105* 88*  CREATININE 9.40* 6.33*  CALCIUM 8.2* 8.6*   PT/INR No results for input(s): LABPROT, INR in the last 72 hours. ABG No results for input(s): PHART, HCO3 in the last 72 hours.  Invalid input(s): PCO2, PO2  Studies/Results: Dg Chest Portable 1 View  08/10/2015  CLINICAL DATA:  Preoperative evaluation. EXAM: PORTABLE CHEST 1 VIEW COMPARISON:  08/08/2015 FINDINGS: Cardiac shadow is stable. The lungs are well aerated bilaterally. No focal infiltrate or sizable effusion is seen. Minimal scarring is again noted in the bases bilaterally. No acute bony abnormality is seen. IMPRESSION: No active disease. Electronically Signed   By: Inez Catalina M.D.   On: 08/10/2015 17:26   Ct Renal Stone Study  08/10/2015  CLINICAL DATA:  history of HTN, DM, BPH who presents to the ED for evaluation of hypokalemia and elevated creatinine. generalized abdominal pain, N/V/D two days ago and diagnosed with kidney stones. bilateral staghorn calculi and was directed to come to [facility] on 5/25. He states he decided to see his urologist, Dr. Karsten Ro, yesterday instead. He states that today he received a call to come to the ED due to his labs. At this time pt states he feels a bit queasy but otherwise  unremarkable. He reports three days of generalized mild abdominal pain with associated nausea and NBNB emesis. States he has had loose stools. Denies BRBPR or black, tarry stools. He states he has trouble urinating in that he can only urinate small amounts at a time but this is  not new for him. Denies new dysuria or hematuria. He denies any chest pain or SOB. Denies new pain or muscle weakness. He states he has never had any heart issues. Pt states that at yesterday's visit he and Dr. Karsten Ro decided to keep a watchful eye on his kidney stones and defer further treatment at this time. low potassium and abnormal creatine results--"patient stated that these symptoms started 5 days ago, n/v/d and dysuria, but can not say if one side or flank area hurts worse than other one.Hx of diabetes, prostate disease, and renal disorder EXAM: CT ABDOMEN AND PELVIS WITHOUT CONTRAST TECHNIQUE: Multidetector CT imaging of the abdomen and pelvis was performed following the standard protocol without IV contrast. COMPARISON:  08/08/2015 and previous FINDINGS: Lower chest: Scattered coronary calcifications. Linear scarring or subsegmental atelectasis in the visualized lung bases. Hepatobiliary: No mass visualized on this un-enhanced exam. Pancreas: Moderate atrophy without focal lesion or ductal dilatation. Spleen: Within normal limits in size. Adrenals/Urinary Tract: Normal adrenals. Bilateral renal calculi, largest on the left in the lower pole extending centrally measuring at least 4.2 cm, on the right in the lower pole 2.3. Mild Right hydronephrosis and proximal ureterectasis to the level of a 8 mm proximal ureteral calculus. Moderate left hydronephrosis and proximal ureterectasis down to the level of a 12 mm proximal ureteral calculus. There is a cluster of small calculi near the level of the left ureteral orifice in the urinary bladder. Urinary bladder is nondistended. Stomach/Bowel: No evidence of obstruction, inflammatory  process, or abnormal fluid collections. Normal appendix. Scattered descending and sigmoid colon diverticula without adjacent inflammatory/edematous change or abscess. Vascular/Lymphatic: Scattered aortoiliac arterial calcifications without aneurysm. No adenopathy localized. Reproductive: Moderate prostatic enlargement. Other: No ascites.  No free air. Musculoskeletal: Degenerative disc disease in the visualized lower thoracic spine and L2-S1. Right hip arthroplasty, hardware incompletely visualized. IMPRESSION: 1. Bilateral nephrolithiasis and obstructing proximal ureteral calculi with hydronephrosis. 2. Descending and sigmoid diverticulosis. 3. Atherosclerosis, including aortoiliac and coronary artery disease. Please note that although the presence of coronary artery calcium documents the presence of coronary artery disease, the severity of this disease and any potential stenosis cannot be assessed on this non-gated CT examination. Assessment for potential risk factor modification, dietary therapy or pharmacologic therapy may be warranted, if clinically indicated. Electronically Signed   By: Lucrezia Europe M.D.   On: 08/10/2015 14:22   Labs and notes reviewed.   CT films and reports reviewed.   Assessment and Plan: He is doing well post op with a marked decline in the Cr and potassium.  Discontinue foley and if able to void, discharge per medical service.  F/U information in chart.        LOS: 1 day    Effie Wahlert J 08/11/2015 [phone]

## 2015-08-11 NOTE — Progress Notes (Signed)
MD Jeffie Pollock made aware of patient voiding 75 ml with bladder scan then showing 488 ml still in bladder. Verbal order to insert 16 F Foley catheter. Will continue with orders.

## 2015-08-12 DIAGNOSIS — R338 Other retention of urine: Secondary | ICD-10-CM | POA: Diagnosis not present

## 2015-08-12 DIAGNOSIS — N179 Acute kidney failure, unspecified: Principal | ICD-10-CM

## 2015-08-12 LAB — BASIC METABOLIC PANEL
ANION GAP: 7 (ref 5–15)
BUN: 58 mg/dL — ABNORMAL HIGH (ref 6–20)
CALCIUM: 8.3 mg/dL — AB (ref 8.9–10.3)
CO2: 17 mmol/L — AB (ref 22–32)
Chloride: 113 mmol/L — ABNORMAL HIGH (ref 101–111)
Creatinine, Ser: 2.85 mg/dL — ABNORMAL HIGH (ref 0.61–1.24)
GFR calc non Af Amer: 20 mL/min — ABNORMAL LOW (ref >60)
GFR, EST AFRICAN AMERICAN: 23 mL/min — AB (ref >60)
GLUCOSE: 121 mg/dL — AB (ref 65–99)
POTASSIUM: 3.6 mmol/L (ref 3.5–5.1)
Sodium: 137 mmol/L (ref 135–145)

## 2015-08-12 LAB — GLUCOSE, CAPILLARY: Glucose-Capillary: 130 mg/dL — ABNORMAL HIGH (ref 65–99)

## 2015-08-12 NOTE — Discharge Summary (Signed)
Physician Discharge Summary  [name] [name] [id number] DOB: January 01, 1935 DOA: 08/10/2015  PCP: Harvie Junior, MD  Admit date: 08/10/2015 Discharge date: 08/12/2015  Time spent: 35* minutes  Recommendations for Outpatient Follow-up:  1. Urology in one week he will home with a Foley catheter. 2. We'll check a basic minimal panels. 3. Follow-up with PCP in one week check a basic metabolic panel at that time can resume metformin if creatinine is at baseline.   Discharge Diagnoses:  Active Problems:   Hyperkalemia   Acute renal failure (HCC)   Acute bilateral obstructive uropathy   HTN (hypertension)   Diabetes mellitus, type 2 ([place])   Acute urinary retention   Discharge Condition: STable  Diet recommendation: regualr  Filed Weights   08/10/15 1841  Weight: 102 kg (224 lb 13.9 oz)    History of present illness:  81 year old male with past medical history of hypertension BPH and diabetes who presents to the hospital with nausea vomiting and diarrhea a KUB on 08/08/2015 revealed bilateral staghorn calculi basic metabolic panel was done that showed a potassium of 6.5 creatinine of 10.  Hospital Course:  Acute kidney injury due to acute bilateral shift of uropathy: Unknown baseline renal function, urology was consulted they recommended cystoscopy with bilateral retrograde pyelogram and bilateral double J stent placement with calculi removal on 08/10/2015 Repeat a was done that showed creatinine has improved from 10 22 and potassium from 6.5 to 3.6 after procedure Ace inhibitor, metformin and NSAIDs were held on admission he was on IV fluids. He will avoid NSAIDs metformin and ACE inhibitor as an outpatient he'll follow-up with his PCP and urology in 1 week these could be resumed after basic metabolic panel was rechecked.  Hyperkalemia: Likely due to acute renal failure resolved with treatment of above.  Essential hypertension: All his antihypertensive medications were held his  blood pressure remains stable he will follow-up with PCP in one week and we can restart his antihypertensive medication at that point.  Controlled diabetes mellitus2: These were held due to his acute renal failure, he only required 2-3 units of sliding scale insulin throughout his hospital stay. He will go home off metformin he will follow-up with PCP in one week and resume metformin once his creatinine has returned to baseline.  Procedures:  Cystoscopy  Consultations:  urology  Discharge Exam: Filed Vitals:   08/11/15 2101 08/12/15 0509  BP: 111/73 131/66  Pulse: 78 71  Temp: 99.2 F (37.3 C) 97.8 F (36.6 C)  Resp: 20 20    General: A&O x3 Cardiovascular: RRR Respiratory: good air movement CTA B/L  Discharge Instructions   Discharge Instructions    Diet - low sodium heart healthy    Complete by:  As directed      Increase activity slowly    Complete by:  As directed           Current Discharge Medication List    CONTINUE these medications which have NOT CHANGED   Details  aspirin EC 81 MG tablet Take 81 mg by mouth daily.    simvastatin (ZOCOR) 20 MG tablet Take 20 mg by mouth at bedtime.     tamsulosin (FLOMAX) 0.4 MG CAPS Take 0.4 mg by mouth 2 (two) times daily with a meal.       STOP taking these medications     lisinopril-hydrochlorothiazide (PRINZIDE,ZESTORETIC) 20-12.5 MG tablet      metFORMIN (GLUCOPHAGE) 1000 MG tablet      naproxen sodium (ANAPROX) 220 MG  tablet        No Known Allergies Follow-up Information    Follow up with Claybon Jabs, MD.   Specialty:  Urology   Why:  Keep your previously scheduled appointment   Contact information:   [place] [id number] [phone]        The results of significant diagnostics from this hospitalization (including imaging, microbiology, ancillary and laboratory) are listed below for reference.    Significant Diagnostic Studies: Dg Chest Portable 1 View  08/10/2015   CLINICAL DATA:  Preoperative evaluation. EXAM: PORTABLE CHEST 1 VIEW COMPARISON:  08/08/2015 FINDINGS: Cardiac shadow is stable. The lungs are well aerated bilaterally. No focal infiltrate or sizable effusion is seen. Minimal scarring is again noted in the bases bilaterally. No acute bony abnormality is seen. IMPRESSION: No active disease. Electronically Signed   By: Inez Catalina M.D.   On: 08/10/2015 17:26   Dg Abd Acute W/chest  08/08/2015  CLINICAL DATA:  Nausea and diarrhea for 3 days, initial encounter EXAM: DG ABDOMEN ACUTE W/ 1V CHEST COMPARISON:  02/03/2006 FINDINGS: Cardiac shadow is within normal limits. Minimal scarring is noted in the bases bilaterally. No focal infiltrate is seen. Scattered large and small bowel gas is noted. Bilateral staghorn calculi are noted. Largest of these on the right measures approximately 22 mm. The entire lower pole calyx on the left is filled with stones. Additionally there is a 11 mm calcific density identified at the pelvic inlet in the expected region of the left ureter. Right hip replacement is noted. Degenerative changes of lumbar spine are seen. IMPRESSION: Bilateral staghorn calculi. Calculus in the expected region of the mid left ureter. CT urography may be helpful for further evaluation. These results will be called to the ordering clinician or representative by the Radiologist Assistant, and communication documented in the PACS or zVision Dashboard. Electronically Signed   By: Inez Catalina M.D.   On: 08/08/2015 14:27   Ct Renal Stone Study  08/10/2015  CLINICAL DATA:  history of HTN, DM, BPH who presents to the ED for evaluation of hypokalemia and elevated creatinine. generalized abdominal pain, N/V/D two days ago and diagnosed with kidney stones. bilateral staghorn calculi and was directed to come to [facility] on 5/25. He states he decided to see his urologist, Dr. Karsten Ro, yesterday instead. He states that today he received a call to come to the ED due to his  labs. At this time pt states he feels a bit queasy but otherwise unremarkable. He reports three days of generalized mild abdominal pain with associated nausea and NBNB emesis. States he has had loose stools. Denies BRBPR or black, tarry stools. He states he has trouble urinating in that he can only urinate small amounts at a time but this is not new for him. Denies new dysuria or hematuria. He denies any chest pain or SOB. Denies new pain or muscle weakness. He states he has never had any heart issues. Pt states that at yesterday's visit he and Dr. Karsten Ro decided to keep a watchful eye on his kidney stones and defer further treatment at this time. low potassium and abnormal creatine results--"patient stated that these symptoms started 5 days ago, n/v/d and dysuria, but can not say if one side or flank area hurts worse than other one.Hx of diabetes, prostate disease, and renal disorder EXAM: CT ABDOMEN AND PELVIS WITHOUT CONTRAST TECHNIQUE: Multidetector CT imaging of the abdomen and pelvis was performed following the standard protocol without IV contrast. COMPARISON:  08/08/2015 and previous FINDINGS: Lower chest: Scattered coronary calcifications. Linear scarring or subsegmental atelectasis in the visualized lung bases. Hepatobiliary: No mass visualized on this un-enhanced exam. Pancreas: Moderate atrophy without focal lesion or ductal dilatation. Spleen: Within normal limits in size. Adrenals/Urinary Tract: Normal adrenals. Bilateral renal calculi, largest on the left in the lower pole extending centrally measuring at least 4.2 cm, on the right in the lower pole 2.3. Mild Right hydronephrosis and proximal ureterectasis to the level of a 8 mm proximal ureteral calculus. Moderate left hydronephrosis and proximal ureterectasis down to the level of a 12 mm proximal ureteral calculus. There is a cluster of small calculi near the level of the left ureteral orifice in the urinary bladder. Urinary bladder is  nondistended. Stomach/Bowel: No evidence of obstruction, inflammatory process, or abnormal fluid collections. Normal appendix. Scattered descending and sigmoid colon diverticula without adjacent inflammatory/edematous change or abscess. Vascular/Lymphatic: Scattered aortoiliac arterial calcifications without aneurysm. No adenopathy localized. Reproductive: Moderate prostatic enlargement. Other: No ascites.  No free air. Musculoskeletal: Degenerative disc disease in the visualized lower thoracic spine and L2-S1. Right hip arthroplasty, hardware incompletely visualized. IMPRESSION: 1. Bilateral nephrolithiasis and obstructing proximal ureteral calculi with hydronephrosis. 2. Descending and sigmoid diverticulosis. 3. Atherosclerosis, including aortoiliac and coronary artery disease. Please note that although the presence of coronary artery calcium documents the presence of coronary artery disease, the severity of this disease and any potential stenosis cannot be assessed on this non-gated CT examination. Assessment for potential risk factor modification, dietary therapy or pharmacologic therapy may be warranted, if clinically indicated. Electronically Signed   By: Lucrezia Europe M.D.   On: 08/10/2015 14:22    Microbiology: No results found for this or any previous visit (from the past 240 hour(s)).   Labs: Basic Metabolic Panel:  Recent Labs Lab 08/10/15 1207 08/10/15 1835 08/11/15 0515 08/12/15 0407  NA 132* 135 138 137  K 6.5* 5.3* 5.0 3.6  CL 108 111 114* 113*  CO2 10* 12* 15* 17*  GLUCOSE 105* 117* 196* 121*  BUN 107* 105* 88* 58*  CREATININE 10.71* 9.40* 6.33* 2.85*  CALCIUM 8.8* 8.2* 8.6* 8.3*   Liver Function Tests:  Recent Labs Lab 08/10/15 1207  AST 27  ALT 27  ALKPHOS 85  BILITOT 1.3*  PROT 6.4*  ALBUMIN 3.6    Recent Labs Lab 08/10/15 1207  LIPASE 60*   No results for input(s): AMMONIA in the last 168 hours. CBC:  Recent Labs Lab 08/10/15 1207 08/11/15 0515  WBC  11.2* 10.5  NEUTROABS 9.1*  --   HGB 14.2 13.5  HCT 41.0 39.6  MCV 93.0 92.1  PLT 229 237   Cardiac Enzymes: No results for input(s): CKTOTAL, CKMB, CKMBINDEX, TROPONINI in the last 168 hours. BNP: BNP (last 3 results) No results for input(s): BNP in the last 8760 hours.  ProBNP (last 3 results) No results for input(s): PROBNP in the last 8760 hours.  CBG:  Recent Labs Lab 08/11/15 0758 08/11/15 1145 08/11/15 1647 08/11/15 2058 08/12/15 0800  GLUCAP 150* 161* 144* 147* 130*       Signed:  Charlynne Cousins MD.  [facility] 08/12/2015, 9:52 AM

## 2015-08-12 NOTE — Progress Notes (Signed)
Patient ID: [name] [name], male   DOB: Jul 25, 1934, 81 y.o.   MRN: [id number] 2 Days Post-Op  Subjective: [name] [name] is doing well today without pain but [name] [name] failed a voiding trial yesterday and the foley was replaced.   His urine remains clear and the Cr has fallen to about 2.8 from 10 on admission.  [name] [name] has no associated signs or symtoms.  ROS:  Review of Systems  Constitutional: Negative for fever and chills.  Gastrointestinal: Negative for abdominal pain.  Genitourinary: Negative for hematuria and flank pain.  All other systems reviewed and are negative.   Anti-infectives: Anti-infectives    Start     Dose/Rate Route Frequency Ordered Stop   08/10/15 1516  ciprofloxacin (CIPRO) IVPB 200 mg     200 mg 100 mL/hr over 60 Minutes Intravenous 60 min pre-op 08/10/15 1516 08/10/15 1625      Current Facility-Administered Medications  Medication Dose Route Frequency Provider Last Rate Last Dose  . 0.45 % sodium chloride infusion   Intravenous Continuous Charlynne Cousins, MD 50 mL/hr at 08/11/15 1600    . acetaminophen (TYLENOL) tablet 650 mg  650 mg Oral Q6H PRN Roxan Hockey, MD       Or  . acetaminophen (TYLENOL) suppository 650 mg  650 mg Rectal Q6H PRN Courage Emokpae, MD      . albuterol (PROVENTIL) (2.5 MG/3ML) 0.083% nebulizer solution 2.5 mg  2.5 mg Nebulization Q2H PRN Courage Emokpae, MD      . feeding supplement (ENSURE ENLIVE) (ENSURE ENLIVE) liquid 237 mL  237 mL Oral BID BM Courage Emokpae, MD   237 mL at 08/11/15 1000  . folic acid (FOLVITE) tablet 1 mg  1 mg Oral Daily Roxan Hockey, MD   1 mg at 08/11/15 0904  . insulin aspart (novoLOG) injection 0-5 Units  0-5 Units Subcutaneous QHS Roxan Hockey, MD   0 Units at 08/10/15 2157  . insulin aspart (novoLOG) injection 0-9 Units  0-9 Units Subcutaneous TID WC Roxan Hockey, MD   1 Units at 08/12/15 0803  . insulin detemir (LEVEMIR) injection 5 Units  5 Units Subcutaneous Daily Charlynne Cousins, MD   5 Units at  08/11/15 1319  . morphine 2 MG/ML injection 2 mg  2 mg Intravenous Q4H PRN Roxan Hockey, MD      . ondansetron (ZOFRAN) tablet 4 mg  4 mg Oral Q6H PRN Roxan Hockey, MD       Or  . ondansetron (ZOFRAN) injection 4 mg  4 mg Intravenous Q6H PRN Courage Emokpae, MD      . oxyCODONE (Oxy IR/ROXICODONE) immediate release tablet 5 mg  5 mg Oral Q4H PRN Courage Emokpae, MD      . pneumococcal 23 valent vaccine (PNU-IMMUNE) injection 0.5 mL  0.5 mL Intramuscular Tomorrow-1000 Courage Emokpae, MD   0.5 mL at 08/11/15 1000  . polyethylene glycol (MIRALAX / GLYCOLAX) packet 17 g  17 g Oral Daily PRN Roxan Hockey, MD      . senna (SENOKOT) tablet 8.6 mg  1 tablet Oral BID Roxan Hockey, MD   8.6 mg at 08/11/15 2128  . simvastatin (ZOCOR) tablet 20 mg  20 mg Oral QHS Roxan Hockey, MD   20 mg at 08/11/15 2128  . sodium chloride flush (NS) 0.9 % injection 3 mL  3 mL Intravenous Q12H Roxan Hockey, MD   3 mL at 08/10/15 2157  . tamsulosin (FLOMAX) capsule 0.4 mg  0.4 mg Oral BID WC Roxan Hockey, MD  0.4 mg at 08/12/15 0803  . thiamine (VITAMIN B-1) tablet 100 mg  100 mg Oral Daily Roxan Hockey, MD   100 mg at 08/11/15 0904  . traZODone (DESYREL) tablet 50 mg  50 mg Oral QHS PRN Roxan Hockey, MD         Objective: Vital signs in last 24 hours: Temp:  [97.8 F (36.6 C)-99.2 F (37.3 C)] 97.8 F (36.6 C) (05/29 0509) Pulse Rate:  [71-94] 71 (05/29 0509) Resp:  [20] 20 (05/29 0509) BP: (111-162)/(66-83) 131/66 mmHg (05/29 0509) SpO2:  [93 %-94 %] 93 % (05/29 0509)  Intake/Output from previous day: 05/28 0701 - 05/29 0700 In: 820 [P.O.:720; I.V.:100] Out: 3250 [Urine:3250] Intake/Output this shift:     Physical Exam  Constitutional: [name] [name] is well-developed, well-nourished, and in no distress.  Genitourinary:  Urine clear in bag  Vitals reviewed.   Lab Results:   Recent Labs  08/10/15 1207 08/11/15 0515  WBC 11.2* 10.5  HGB 14.2 13.5  HCT 41.0 39.6  PLT 229 237    BMET  Recent Labs  08/11/15 0515 08/12/15 0407  NA 138 137  K 5.0 3.6  CL 114* 113*  CO2 15* 17*  GLUCOSE 196* 121*  BUN 88* 58*  CREATININE 6.33* 2.85*  CALCIUM 8.6* 8.3*   PT/INR No results for input(s): LABPROT, INR in the last 72 hours. ABG No results for input(s): PHART, HCO3 in the last 72 hours.  Invalid input(s): PCO2, PO2  Studies/Results: Dg Chest Portable 1 View  08/10/2015  CLINICAL DATA:  Preoperative evaluation. EXAM: PORTABLE CHEST 1 VIEW COMPARISON:  08/08/2015 FINDINGS: Cardiac shadow is stable. The lungs are well aerated bilaterally. No focal infiltrate or sizable effusion is seen. Minimal scarring is again noted in the bases bilaterally. No acute bony abnormality is seen. IMPRESSION: No active disease. Electronically Signed   By: Inez Catalina M.D.   On: 08/10/2015 17:26   Ct Renal Stone Study  08/10/2015  CLINICAL DATA:  history of HTN, DM, BPH who presents to the ED for evaluation of hypokalemia and elevated creatinine. generalized abdominal pain, N/V/D two days ago and diagnosed with kidney stones. bilateral staghorn calculi and was directed to come to [facility] on 5/25. [name] [name] states [name] [name] decided to see his urologist, Dr. Karsten Ro, yesterday instead. [name] [name] states that today [name] [name] received a call to come to the ED due to his labs. At this time pt states [name] [name] feels a bit queasy but otherwise unremarkable. [name] [name] reports three days of generalized mild abdominal pain with associated nausea and NBNB emesis. States [name] [name] has had loose stools. Denies BRBPR or black, tarry stools. [name] [name] states [name] [name] has trouble urinating in that [name] [name] can only urinate small amounts at a time but this is not new for him. Denies new dysuria or hematuria. [name] [name] denies any chest pain or SOB. Denies new pain or muscle weakness. [name] [name] states [name] [name] has never had any heart issues. Pt states that at yesterday's visit [name] [name] and Dr. Karsten Ro decided to keep a watchful eye on his kidney stones and defer further treatment at this time. low potassium  and abnormal creatine results--"patient stated that these symptoms started 5 days ago, n/v/d and dysuria, but can not say if one side or flank area hurts worse than other one.Hx of diabetes, prostate disease, and renal disorder EXAM: CT ABDOMEN AND PELVIS WITHOUT CONTRAST TECHNIQUE: Multidetector CT imaging of the abdomen and pelvis was performed following the standard protocol without IV contrast. COMPARISON:  08/08/2015 and previous FINDINGS: Lower chest:  Scattered coronary calcifications. Linear scarring or subsegmental atelectasis in the visualized lung bases. Hepatobiliary: No mass visualized on this un-enhanced exam. Pancreas: Moderate atrophy without focal lesion or ductal dilatation. Spleen: Within normal limits in size. Adrenals/Urinary Tract: Normal adrenals. Bilateral renal calculi, largest on the left in the lower pole extending centrally measuring at least 4.2 cm, on the right in the lower pole 2.3. Mild Right hydronephrosis and proximal ureterectasis to the level of a 8 mm proximal ureteral calculus. Moderate left hydronephrosis and proximal ureterectasis down to the level of a 12 mm proximal ureteral calculus. There is a cluster of small calculi near the level of the left ureteral orifice in the urinary bladder. Urinary bladder is nondistended. Stomach/Bowel: No evidence of obstruction, inflammatory process, or abnormal fluid collections. Normal appendix. Scattered descending and sigmoid colon diverticula without adjacent inflammatory/edematous change or abscess. Vascular/Lymphatic: Scattered aortoiliac arterial calcifications without aneurysm. No adenopathy localized. Reproductive: Moderate prostatic enlargement. Other: No ascites.  No free air. Musculoskeletal: Degenerative disc disease in the visualized lower thoracic spine and L2-S1. Right hip arthroplasty, hardware incompletely visualized. IMPRESSION: 1. Bilateral nephrolithiasis and obstructing proximal ureteral calculi with hydronephrosis. 2.  Descending and sigmoid diverticulosis. 3. Atherosclerosis, including aortoiliac and coronary artery disease. Please note that although the presence of coronary artery calcium documents the presence of coronary artery disease, the severity of this disease and any potential stenosis cannot be assessed on this non-gated CT examination. Assessment for potential risk factor modification, dietary therapy or pharmacologic therapy may be warranted, if clinically indicated. Electronically Signed   By: Lucrezia Europe M.D.   On: 08/10/2015 14:22   Labs reviewed.  Hospitalist note reviewed.   Assessment and Plan: Obstructive uropathy from right ureteral stone, left staghorn stone and BPH with BOO and bladder stones now resolving post stenting.   [name] [name] failed a voiding trial and the foley is back in.  [name] [name] could be discharged with the foley and f/u with Dr. Karsten Ro for definitive therapy as planned.        LOS: 2 days    Malka So 08/12/2015 [id number]

## 2015-08-13 ENCOUNTER — Encounter (HOSPITAL_COMMUNITY): Payer: Self-pay | Admitting: Urology

## 2015-08-13 LAB — COMPREHENSIVE METABOLIC PANEL
ALT: 27 U/L (ref 17–63)
AST: 27 U/L (ref 15–41)
Albumin: 3.6 g/dL (ref 3.5–5.0)
Alkaline Phosphatase: 85 U/L (ref 38–126)
Anion gap: 14 (ref 5–15)
BUN: 107 mg/dL — ABNORMAL HIGH (ref 6–20)
CO2: 10 mmol/L — ABNORMAL LOW (ref 22–32)
Calcium: 8.8 mg/dL — ABNORMAL LOW (ref 8.9–10.3)
Chloride: 108 mmol/L (ref 101–111)
Creatinine, Ser: 10.71 mg/dL — ABNORMAL HIGH (ref 0.61–1.24)
GFR calc Af Amer: 5 mL/min — ABNORMAL LOW (ref >60)
GFR calc non Af Amer: 4 mL/min — ABNORMAL LOW (ref >60)
Glucose, Bld: 105 mg/dL — ABNORMAL HIGH (ref 65–99)
Potassium: 6.5 mmol/L (ref 3.5–5.1)
Sodium: 132 mmol/L — ABNORMAL LOW (ref 135–145)
Total Bilirubin: 1.3 mg/dL — ABNORMAL HIGH (ref 0.3–1.2)
Total Protein: 6.4 g/dL — ABNORMAL LOW (ref 6.5–8.1)

## 2015-08-13 LAB — POCT I-STAT 4, (NA,K, GLUC, HGB,HCT)
GLUCOSE: 68 mg/dL (ref 65–99)
HEMATOCRIT: 37 % — AB (ref 39.0–52.0)
HEMOGLOBIN: 12.6 g/dL — AB (ref 13.0–17.0)
POTASSIUM: 5 mmol/L (ref 3.5–5.1)
SODIUM: 137 mmol/L (ref 135–145)

## 2015-08-27 ENCOUNTER — Other Ambulatory Visit: Payer: Self-pay | Admitting: Urology

## 2015-08-27 DIAGNOSIS — N202 Calculus of kidney with calculus of ureter: Secondary | ICD-10-CM | POA: Diagnosis not present

## 2015-08-27 DIAGNOSIS — N201 Calculus of ureter: Secondary | ICD-10-CM | POA: Diagnosis not present

## 2015-08-29 ENCOUNTER — Encounter (HOSPITAL_COMMUNITY): Payer: Self-pay

## 2015-08-29 ENCOUNTER — Encounter (HOSPITAL_COMMUNITY)
Admission: RE | Admit: 2015-08-29 | Discharge: 2015-08-29 | Disposition: A | Discharge status: Home / Self Care | Payer: Medicare HMO | Source: Ambulatory Visit | Attending: Urology | Admitting: Urology

## 2015-08-29 DIAGNOSIS — Z01812 Encounter for preprocedural laboratory examination: Secondary | ICD-10-CM | POA: Insufficient documentation

## 2015-08-29 DIAGNOSIS — E875 Hyperkalemia: Secondary | ICD-10-CM | POA: Insufficient documentation

## 2015-08-29 DIAGNOSIS — N179 Acute kidney failure, unspecified: Secondary | ICD-10-CM | POA: Diagnosis not present

## 2015-08-29 DIAGNOSIS — N209 Urinary calculus, unspecified: Secondary | ICD-10-CM | POA: Diagnosis not present

## 2015-08-29 HISTORY — DX: Unspecified osteoarthritis, unspecified site: M19.90

## 2015-08-29 NOTE — Pre-Procedure Instructions (Signed)
Recent labs , EKG, CXR in EPIC

## 2015-08-29 NOTE — Patient Instructions (Addendum)
[name] [name]  08/29/2015   Your procedure is scheduled on: 09/02/2015    Report to [facility] Main  Entrance take [place]  elevators to 3rd floor to  [place] at   [facility] AM.  Call this number if you have problems the morning of surgery [phone]   Remember: ONLY 1 PERSON MAY GO WITH YOU TO SHORT STAY TO GET  READY MORNING OF [place].  Do not eat food or drink liquids :After Midnight.             Eat a good healthy snack prior to bedtime.      Take these medicines the morning of surgery with A SIP OF WATER:  Flomax  DO NOT TAKE ANY DIABETIC MEDICATIONS DAY OF YOUR SURGERY                               You may not have any metal on your body including hair pins and              piercings  Do not wear jewelry, lotions, powders, deodorant                        Men may shave face and neck.   Do not bring valuables to the hospital. [place].  Contacts, dentures or bridgework may not be worn into surgery.      Patients discharged the day of surgery will not be allowed to drive home.  Name and phone number of your driver:  Special Instructions: coughing and deep breathing exercises, leg exercises               Please read over the following fact sheets you were given: _____________________________________________________________________             [facility] - Preparing for Surgery Before surgery, you can play an important role.  Because skin is not sterile, your skin needs to be as free of germs as possible.  You can reduce the number of germs on your skin by washing with CHG (chlorahexidine gluconate) soap before surgery.  CHG is an antiseptic cleaner which kills germs and bonds with the skin to continue killing germs even after washing. Please DO NOT use if you have an allergy to CHG or antibacterial soaps.  If your skin becomes reddened/irritated stop using the CHG and inform your nurse when  you arrive at Short Stay. Do not shave (including legs and underarms) for at least 48 hours prior to the first CHG shower.  You may shave your face/neck. Please follow these instructions carefully:  1.  Shower with CHG Soap the night before surgery and the  morning of Surgery.  2.  If you choose to wash your hair, wash your hair first as usual with your  normal  shampoo.  3.  After you shampoo, rinse your hair and body thoroughly to remove the  shampoo.                           4.  Use CHG as you would any other liquid soap.  You can apply chg directly  to the skin and wash  Gently with a scrungie or clean washcloth.  5.  Apply the CHG Soap to your body ONLY FROM THE NECK DOWN.   Do not use on face/ open                           Wound or open sores. Avoid contact with eyes, ears mouth and genitals (private parts).                       Wash face,  Genitals (private parts) with your normal soap.             6.  Wash thoroughly, paying special attention to the area where your surgery  will be performed.  7.  Thoroughly rinse your body with warm water from the neck down.  8.  DO NOT shower/wash with your normal soap after using and rinsing off  the CHG Soap.                9.  Pat yourself dry with a clean towel.            10.  Wear clean pajamas.            11.  Place clean sheets on your bed the night of your first shower and do not  sleep with pets. Day of Surgery : Do not apply any lotions/deodorants the morning of surgery.  Please wear clean clothes to the hospital/surgery center.  FAILURE TO FOLLOW THESE INSTRUCTIONS MAY RESULT IN THE CANCELLATION OF YOUR SURGERY PATIENT SIGNATURE_________________________________  NURSE SIGNATURE__________________________________  ________________________________________________________________________

## 2015-08-30 LAB — HEMOGLOBIN A1C
Hgb A1c MFr Bld: 8 % — ABNORMAL HIGH (ref 4.8–5.6)
Mean Plasma Glucose: 183 mg/dL

## 2015-08-30 NOTE — Progress Notes (Signed)
08-30-15 0800 Hgb A1C = 8.0 with PAT labs viewable in Epic.

## 2015-09-01 MED ORDER — CIPROFLOXACIN IN D5W 200 MG/100ML IV SOLN
200.0000 mg | INTRAVENOUS | Status: AC
  Administered 2015-09-02: 200 mg via INTRAVENOUS
  Filled 2015-09-01 (×2): qty 100

## 2015-09-01 NOTE — Anesthesia Preprocedure Evaluation (Addendum)
Anesthesia Evaluation  Patient identified by MRN, date of birth, ID band  Reviewed: Allergy & Precautions, NPO status , Patient's Chart, lab work & pertinent test results  Airway Mallampati: III  TM Distance: <3 FB Neck ROM: Full    Dental  (+) Teeth Intact, Caps, Dental Advisory Given,    Pulmonary neg pulmonary ROS,    breath sounds clear to auscultation       Cardiovascular hypertension,  Rhythm:Regular Rate:Normal     Neuro/Psych negative neurological ROS     GI/Hepatic negative GI ROS,   Endo/Other  diabetes, Type 2  Renal/GU Renal InsufficiencyRenal diseaseRenal stones     Musculoskeletal  (+) Arthritis ,   Abdominal (+) + obese,   Peds  Hematology   Anesthesia Other Findings   Reproductive/Obstetrics                           Anesthesia Physical Anesthesia Plan  ASA: III  Anesthesia Plan: General   Post-op Pain Management:    Induction: Intravenous  Airway Management Planned: LMA  Additional Equipment:   Intra-op Plan:   Post-operative Plan: Extubation in OR  Informed Consent: I have reviewed the patients History and Physical, chart, labs and discussed the procedure including the risks, benefits and alternatives for the proposed anesthesia with the patient or authorized representative who has indicated his/her understanding and acceptance.   Dental advisory given  Plan Discussed with: CRNA and Surgeon  Anesthesia Plan Comments:        Anesthesia Quick Evaluation

## 2015-09-01 NOTE — H&P (Signed)
[name] [name]  MRN: [id number]  PRIMARY CARE:  Ardelia Mems. Jimmye Norman, MD  DOB: 05-Mar-1935, 81 year old Male  REFERRING:  Nelda Severe. Kindl, MD    --------------------------------------------------------------------------------   CC: I have ureteral stone.  HPI: [name] [name] is a 81 year-old male established patient who is here for bilateral ureteral stones.  The problem is on both sides. He first noticed the symptoms 08/10/2015. This is his first kidney stone.   He has never had surgical treatment for calculi in the past.   He reports he is feeling much better now that he has stents in place.     HPI: The problem is on both sides. He first noticed the symptoms 08/08/2015. He is not currently having flank pain, back pain, groin pain, nausea, vomiting, fever or chills. He has not caught a stone in his urine strainer since his symptoms began.     ALLERGIES: No Allergies    MEDICATIONS: Aleve 220 MG Oral Capsule Oral  Aspirin 81 MG TABS Oral  Lisinopril TABS Oral  MetFORMIN HCl TABS Oral  Simvastatin TABS Oral  Tamsulosin HCl CAPS Oral     GU PSH: Cysto Bladder Stone <2.5cm - 08/14/2015 Cystoscopy Insert Stent - 08/14/2015      PSH Notes: Cystoscopy With Fragmentation Of Bladder Calculus, Cystoscopy With Insertion Of Ureteral Stent Bilateral, Total Hip Replacement   NON-GU PSH: Total Hip Replacement - 08/09/2015    GU PMH: Calculus Ureter, Ureteral calculus, left - 08/10/2015, Ureteral calculus, right, - 08/10/2015 Kidney Failure, acute, Unspec, Acute renal failure - 08/10/2015 Kidney Stone, Renal calculus, bilateral - 08/09/2015 Nocturia, Nocturia - 08/09/2015 Urinary Tract Inf, Unspec site, Pyuria - 08/09/2015      PMH Notes: Bilateral renal calculi: KUB 08/08/15 revealed bilateral partial staghorn calculi with a laminated stone in the right kidney measuring 2.2 cm and a large partial staghorn in the left kidney measuring 3.14 cm with multiple renal calculi noted in the lower pole as well. No  ureteral stones were noted.   BPH w/ LUTS: He does report having nocturia 3-4 for about 4 years associated with a weak urinary stream as well as some frequency and urgency in the daytime. He is currently taking tamsulosin 0.4 mg.     NON-GU PMH: Encounter for general adult medical examination without abnormal findings, Encounter for preventive health examination - 08/09/2015 Personal history of other diseases of the circulatory system, History of hypertension - 08/09/2015 Personal history of other diseases of the digestive system, History of esophageal reflux - 08/09/2015 Personal history of other diseases of the musculoskeletal system and connective tissue, History of arthritis - 08/09/2015 Personal history of other endocrine, nutritional and metabolic disease, History of diabetes mellitus - 08/09/2015    FAMILY HISTORY: No pertinent family history - Runs In Family   SOCIAL HISTORY: Marital Status: Single Has never drank.      Notes: Never a smoker, Retired, Caffeine use, Alcohol use, Single   REVIEW OF SYSTEMS:    GU Review Male:   Patient denies frequent urination, hard to postpone urination, burning/ pain with urination, get up at night to urinate, leakage of urine, stream starts and stops, trouble starting your stream, have to strain to urinate , erection problems, and penile pain.  Gastrointestinal (Upper):   Patient denies nausea, vomiting, and indigestion/ heartburn.  Gastrointestinal (Lower):   Patient denies diarrhea and constipation.  Constitutional:   Patient denies fever, night sweats, weight loss, and fatigue.  Skin:   Patient denies skin rash/ lesion  and itching.  Eyes:   Patient denies blurred vision and double vision.  Ears/ Nose/ Throat:   Patient denies sore throat and sinus problems.  Hematologic/Lymphatic:   Patient denies swollen glands and easy bruising.  Cardiovascular:   Patient denies leg swelling and chest pains.  Respiratory:   Patient denies cough and shortness of  breath.  Endocrine:   Patient denies excessive thirst.  Musculoskeletal:   Patient reports joint pain. Patient denies back pain.  Neurological:   Patient denies headaches and dizziness.  Psychologic:   Patient denies depression and anxiety.   VITAL SIGNS:    Weight: 235 lb/106.6 kg   Height/Length: 70 in / 178 cm   BP: 173/87 mmHg   Heart Rate: 104 /min   BMI: 33.7     Physical Exam  Constitutional: Well nourished and well developed . No acute distress.   ENT:. The ears and nose are normal in appearance.   Neck: The appearance of the neck is normal and no neck mass is present.   Pulmonary: No respiratory distress and normal respiratory rhythm and effort.   Cardiovascular: Heart rate and rhythm are normal . No peripheral edema.   Abdomen: The abdomen is mildly obese. The abdomen is soft and nontender. No masses are palpated. No CVA tenderness. No hernias are palpable. No hepatosplenomegaly noted.   Rectal: Rectal exam demonstrates normal sphincter tone, no tenderness and no masses. Estimated prostate size is 2+. The prostate has no nodularity and is not tender. The left seminal vesicle is nonpalpable. The right seminal vesicle is nonpalpable. The perineum is normal on inspection.   Genitourinary: Examination of the penis demonstrates no discharge, no masses, no lesions and a normal meatus. The penis is circumcised. The scrotum is without lesions. The right epididymis is palpably normal and non-tender. The left epididymis is palpably normal and non-tender. The right testis is non-tender and without masses. The left testis is non-tender and without masses.   Lymphatics: The femoral and inguinal nodes are not enlarged or tender.   Skin: Normal skin turgor, no visible rash and no visible skin lesions.   Neuro/Psych:. Mood and affect are appropriate. He has a resting tremor in his upper extremities.    PAST DATA REVIEWED:  Source Of History:  Outside  Source  Lab Test Review:   BUN, Creatinine and GFR  Records Review:   Previous Hospital Records, Previous Patient Records  X-Ray Review: C.T. Abdomen: Reviewed Films. Discussed With Patient.  C.T. Pelvis: Reviewed Films.    Notes:                     His creatinine at the time of discharge from the hospital on 08/12/15 was down to 2.85.   PROCEDURES:         KUB - 74000  A single view of the abdomen is obtained. stents are seen in both ureters however a stent on the right side appears to have migrated. The stone in his left ureter is seen at the level of the L4 vertebral body and measures about 13 mm. I cannot definitely discern the stone in the right ureter.      I went over hisKUB results with him today.         Urinalysis w/Scope - 81001 Dipstick Dipstick Cont'd Micro  Specimen: Voided Bilirubin: Neg WBC/hpf: 6-10/hpf  Color: Yellow Ketones: Neg RBC/hpf: 20-40/hpf  Appearance: Clear Blood: 3+ Bacteria: Few (10-25/hpf)  Specific Gravity: 1.025 Protein: 1+ Cystals: NS (Not Seen)  pH: 5.5 Urobilinogen: 0.2 Casts: NS (Not Seen)  Glucose: Neg Nitrites: Neg Trichomonas: Not Present    Leukocyte Esterase: 1+ Mucous: Not Present      Epithelial Cells: 0-5/hpf      Yeast: NS (Not Seen)      Sperm: Not Present    ASSESSMENT:      ICD-10 Details  1 GU:   Calculus Ureter - N20.1 Stable - I continued his left ureteral stone but it is difficult to see the right ureteral stone. These would be best managed with ureteroscopy.  2   Kidney Stone - N20.0 Stable - his renal calculi are stable.  3   Nocturia - R35.1 Stable - I am going to let him try an alpha blocker to see if this helps.   PLAN:            Medications Samples: Rapaflo 8 mg capsule 1 capsule PO Q PM Take with your evening meal. #14  LOT#Not Entered             Orders Labs BMP, Urine Culture and Sensitivity  X-Rays: KUB          Schedule Return Visit: 1-2 Weeks - Schedule Surgery           Notes:   We discussed the  management of urinary stones. These options include observation, ureteroscopy, shockwave lithotripsy, and PCNL. We discussed which options are relevant to these particular stones. We discussed the natural history of stones as well as the complications of untreated stones and the impact on quality of life without treatment as well as with each of the above listed treatments. We also discussed the efficacy of each treatment in its ability to clear the stone burden. With any of these management options I discussed the signs and symptoms of infection and the need for emergent treatment should these be experienced. For each option we discussed the ability of each procedure to clear the patient of their stone burden.   For observation I described the risks which include but are not limited to silent renal damage, life-threatening infection, need for emergent surgery, failure to pass stone, and pain.   For ureteroscopy I described the risks which include heart attack, stroke, pulmonary embolus, death, bleeding, infection, damage to contiguous structures, positioning injury, ureteral stricture, ureteral avulsion, ureteral injury, need for ureteral stent, inability to perform ureteroscopy, need for an interval procedure, inability to clear stone burden, stent discomfort and pain.   For shockwave lithotripsy I described the risks which include arrhythmia, kidney contusion, kidney hemorrhage, need for transfusion, long-term risk of diabetes or hypertension, back discomfort, flank ecchymosis, flank abrasion, inability to break up stone, inability to pass stone fragments, Steinstrasse, infection associated with obstructing stones, need for different surgical procedure and possible need for repeat shockwave lithotripsy.   because the left stone is large and he has a migrated stent on the right hand side ureteroscopy is indicated as opposed to lithotripsy. I will plan to remove his stents, treat his stones and replaisce  his stents.   I will culture his urine and preparation for surgery and check his creatinine again today.   I'm going to give him samples of Rapaflo for his nocturia and see if he notes improvement on this medication.

## 2015-09-02 ENCOUNTER — Ambulatory Visit (HOSPITAL_COMMUNITY): Payer: Medicare HMO | Admitting: Anesthesiology

## 2015-09-02 ENCOUNTER — Ambulatory Visit (HOSPITAL_COMMUNITY)
Admission: RE | Admit: 2015-09-02 | Discharge: 2015-09-02 | Disposition: A | Discharge status: Home / Self Care | Payer: Medicare HMO | Source: Ambulatory Visit | Attending: Urology | Admitting: Urology

## 2015-09-02 ENCOUNTER — Encounter (HOSPITAL_COMMUNITY)
Admission: RE | Disposition: A | Discharge status: Home / Self Care | Payer: Self-pay | Source: Ambulatory Visit | Attending: Urology

## 2015-09-02 ENCOUNTER — Encounter (HOSPITAL_COMMUNITY): Payer: Self-pay | Admitting: *Deleted

## 2015-09-02 ENCOUNTER — Ambulatory Visit (HOSPITAL_COMMUNITY): Payer: Medicare HMO

## 2015-09-02 DIAGNOSIS — M1990 Unspecified osteoarthritis, unspecified site: Secondary | ICD-10-CM | POA: Diagnosis not present

## 2015-09-02 DIAGNOSIS — E669 Obesity, unspecified: Secondary | ICD-10-CM | POA: Insufficient documentation

## 2015-09-02 DIAGNOSIS — N202 Calculus of kidney with calculus of ureter: Secondary | ICD-10-CM | POA: Insufficient documentation

## 2015-09-02 DIAGNOSIS — N189 Chronic kidney disease, unspecified: Secondary | ICD-10-CM | POA: Diagnosis not present

## 2015-09-02 DIAGNOSIS — Z6833 Body mass index (BMI) 33.0-33.9, adult: Secondary | ICD-10-CM | POA: Insufficient documentation

## 2015-09-02 DIAGNOSIS — Z7984 Long term (current) use of oral hypoglycemic drugs: Secondary | ICD-10-CM | POA: Diagnosis not present

## 2015-09-02 DIAGNOSIS — Z791 Long term (current) use of non-steroidal anti-inflammatories (NSAID): Secondary | ICD-10-CM | POA: Insufficient documentation

## 2015-09-02 DIAGNOSIS — I10 Essential (primary) hypertension: Secondary | ICD-10-CM | POA: Insufficient documentation

## 2015-09-02 DIAGNOSIS — E119 Type 2 diabetes mellitus without complications: Secondary | ICD-10-CM | POA: Insufficient documentation

## 2015-09-02 DIAGNOSIS — Z7982 Long term (current) use of aspirin: Secondary | ICD-10-CM | POA: Insufficient documentation

## 2015-09-02 DIAGNOSIS — I129 Hypertensive chronic kidney disease with stage 1 through stage 4 chronic kidney disease, or unspecified chronic kidney disease: Secondary | ICD-10-CM | POA: Diagnosis not present

## 2015-09-02 DIAGNOSIS — Z01818 Encounter for other preprocedural examination: Secondary | ICD-10-CM | POA: Diagnosis not present

## 2015-09-02 DIAGNOSIS — N201 Calculus of ureter: Secondary | ICD-10-CM | POA: Diagnosis not present

## 2015-09-02 DIAGNOSIS — Z79899 Other long term (current) drug therapy: Secondary | ICD-10-CM | POA: Diagnosis not present

## 2015-09-02 DIAGNOSIS — N200 Calculus of kidney: Secondary | ICD-10-CM

## 2015-09-02 HISTORY — PX: HOLMIUM LASER APPLICATION: SHX5852

## 2015-09-02 HISTORY — PX: CYSTOSCOPY WITH RETROGRADE PYELOGRAM, URETEROSCOPY AND STENT PLACEMENT: SHX5789

## 2015-09-02 LAB — GLUCOSE, CAPILLARY
Glucose-Capillary: 155 mg/dL — ABNORMAL HIGH (ref 65–99)
Glucose-Capillary: 145 mg/dL — ABNORMAL HIGH (ref 65–99)

## 2015-09-02 SURGERY — CYSTOURETEROSCOPY, WITH RETROGRADE PYELOGRAM AND STENT INSERTION
Anesthesia: General | Laterality: Bilateral

## 2015-09-02 MED ORDER — PHENYLEPHRINE HCL 10 MG/ML IJ SOLN
INTRAMUSCULAR | Status: AC
  Filled 2015-09-02: qty 1

## 2015-09-02 MED ORDER — DEXAMETHASONE SODIUM PHOSPHATE 10 MG/ML IJ SOLN
INTRAMUSCULAR | Status: AC
  Filled 2015-09-02: qty 1

## 2015-09-02 MED ORDER — SODIUM CHLORIDE 0.9 % IV SOLN
INTRAVENOUS | Status: DC | PRN
  Administered 2015-09-02: 10 mL

## 2015-09-02 MED ORDER — ONDANSETRON HCL 4 MG/2ML IJ SOLN
INTRAMUSCULAR | Status: AC
  Filled 2015-09-02: qty 2

## 2015-09-02 MED ORDER — FENTANYL CITRATE (PF) 100 MCG/2ML IJ SOLN
INTRAMUSCULAR | Status: AC
  Filled 2015-09-02: qty 2

## 2015-09-02 MED ORDER — PHENYLEPHRINE 40 MCG/ML (10ML) SYRINGE FOR IV PUSH (FOR BLOOD PRESSURE SUPPORT)
PREFILLED_SYRINGE | INTRAVENOUS | Status: AC
  Filled 2015-09-02: qty 10

## 2015-09-02 MED ORDER — SODIUM CHLORIDE 0.9 % IR SOLN
Status: DC | PRN
  Administered 2015-09-02: 1000 mL

## 2015-09-02 MED ORDER — HYDROCODONE-ACETAMINOPHEN 10-325 MG PO TABS: 1.0000 | ORAL_TABLET | ORAL | Status: DC | PRN

## 2015-09-02 MED ORDER — EPHEDRINE SULFATE 50 MG/ML IJ SOLN
INTRAMUSCULAR | Status: AC
  Filled 2015-09-02: qty 1

## 2015-09-02 MED ORDER — SODIUM CHLORIDE 0.9 % IR SOLN
Status: DC | PRN
  Administered 2015-09-02 (×3): 1000 mL

## 2015-09-02 MED ORDER — TAMSULOSIN HCL 0.4 MG PO CAPS: 0.4000 mg | ORAL_CAPSULE | ORAL | Status: DC

## 2015-09-02 MED ORDER — TAMSULOSIN HCL 0.4 MG PO CAPS
0.4000 mg | ORAL_CAPSULE | Freq: Once | ORAL | Status: AC
  Administered 2015-09-02: 0.4 mg via ORAL
  Filled 2015-09-02: qty 1

## 2015-09-02 MED ORDER — PHENAZOPYRIDINE HCL 200 MG PO TABS: 200.0000 mg | ORAL_TABLET | Freq: Three times a day (TID) | ORAL | Status: DC | PRN

## 2015-09-02 MED ORDER — LIDOCAINE 2% (20 MG/ML) 5 ML SYRINGE
INTRAMUSCULAR | Status: DC | PRN
  Administered 2015-09-02: 80 mg via INTRAVENOUS

## 2015-09-02 MED ORDER — PHENYLEPHRINE HCL 10 MG/ML IJ SOLN
INTRAMUSCULAR | Status: DC | PRN
  Administered 2015-09-02 (×4): 80 ug via INTRAVENOUS

## 2015-09-02 MED ORDER — 0.9 % SODIUM CHLORIDE (POUR BTL) OPTIME
TOPICAL | Status: DC | PRN
  Administered 2015-09-02: 1000 mL

## 2015-09-02 MED ORDER — PROPOFOL 10 MG/ML IV BOLUS
INTRAVENOUS | Status: AC
  Filled 2015-09-02: qty 20

## 2015-09-02 MED ORDER — PHENAZOPYRIDINE HCL 200 MG PO TABS
200.0000 mg | ORAL_TABLET | Freq: Once | ORAL | Status: AC
  Administered 2015-09-02: 200 mg via ORAL

## 2015-09-02 MED ORDER — LACTATED RINGERS IV SOLN
INTRAVENOUS | Status: DC | PRN
  Administered 2015-09-02 (×2): via INTRAVENOUS

## 2015-09-02 MED ORDER — LIDOCAINE HCL (CARDIAC) 20 MG/ML IV SOLN
INTRAVENOUS | Status: AC
  Filled 2015-09-02: qty 5

## 2015-09-02 MED ORDER — PHENAZOPYRIDINE HCL 200 MG PO TABS
ORAL_TABLET | ORAL | Status: AC
  Administered 2015-09-02: 200 mg via ORAL
  Filled 2015-09-02: qty 1

## 2015-09-02 MED ORDER — PROPOFOL 10 MG/ML IV BOLUS
INTRAVENOUS | Status: DC | PRN
  Administered 2015-09-02: 170 mg via INTRAVENOUS

## 2015-09-02 MED ORDER — FENTANYL CITRATE (PF) 100 MCG/2ML IJ SOLN
INTRAMUSCULAR | Status: DC | PRN
  Administered 2015-09-02 (×5): 25 ug via INTRAVENOUS
  Administered 2015-09-02: 50 ug via INTRAVENOUS
  Administered 2015-09-02: 25 ug via INTRAVENOUS

## 2015-09-02 MED ORDER — SODIUM CHLORIDE 0.9 % IR SOLN
Status: DC | PRN
  Administered 2015-09-02: 3000 mL

## 2015-09-02 MED ORDER — PROMETHAZINE HCL 25 MG/ML IJ SOLN: 6.2500 mg | INTRAMUSCULAR | Status: DC | PRN

## 2015-09-02 MED ORDER — EPHEDRINE SULFATE 50 MG/ML IJ SOLN
INTRAMUSCULAR | Status: DC | PRN
  Administered 2015-09-02: 20 mg via INTRAVENOUS

## 2015-09-02 MED ORDER — HYDROMORPHONE HCL 1 MG/ML IJ SOLN: 0.2500 mg | INTRAMUSCULAR | Status: DC | PRN

## 2015-09-02 MED ORDER — DEXAMETHASONE SODIUM PHOSPHATE 10 MG/ML IJ SOLN: INTRAMUSCULAR | Status: DC | PRN

## 2015-09-02 MED ORDER — ONDANSETRON HCL 4 MG/2ML IJ SOLN
INTRAMUSCULAR | Status: DC | PRN
  Administered 2015-09-02: 4 mg via INTRAVENOUS

## 2015-09-02 MED ORDER — DEXTROSE 5 % IV SOLN
10.0000 mg | INTRAVENOUS | Status: DC | PRN
  Administered 2015-09-02: 50 ug/min via INTRAVENOUS

## 2015-09-02 SURGICAL SUPPLY — 22 items
BAG URO CATCHER STRL LF (MISCELLANEOUS) ×2 IMPLANT
BASKET DAKOTA 1.9FR 11X120 (BASKET) ×1 IMPLANT
BASKET LASER NITINOL 1.9FR (BASKET) ×1 IMPLANT
BASKET ZERO TIP NITINOL 2.4FR (BASKET) ×1 IMPLANT
BSKT STON RTRVL 120 1.9FR (BASKET)
BSKT STON RTRVL ZERO TP 2.4FR (BASKET) ×1
CATH INTERMIT  6FR 70CM (CATHETERS) ×2 IMPLANT
CLOTH BEACON ORANGE TIMEOUT ST (SAFETY) ×2 IMPLANT
FIBER LASER FLEXIVA 1000 (UROLOGICAL SUPPLIES) IMPLANT
FIBER LASER FLEXIVA 365 (UROLOGICAL SUPPLIES) IMPLANT
FIBER LASER FLEXIVA 550 (UROLOGICAL SUPPLIES) IMPLANT
FIBER LASER TRAC TIP (UROLOGICAL SUPPLIES) ×2 IMPLANT
GLOVE BIOGEL M 8.0 STRL (GLOVE) ×2 IMPLANT
GOWN STRL REUS W/TWL XL LVL3 (GOWN DISPOSABLE) ×2 IMPLANT
GUIDEWIRE STR DUAL SENSOR (WIRE) ×3 IMPLANT
IV NS 1000ML (IV SOLUTION) ×2
IV NS 1000ML BAXH (IV SOLUTION) ×1 IMPLANT
MANIFOLD NEPTUNE II (INSTRUMENTS) ×2 IMPLANT
PACK CYSTO (CUSTOM PROCEDURE TRAY) ×2 IMPLANT
STENT URET 6FRX24 CONTOUR (STENTS) ×2 IMPLANT
SYRINGE IRR TOOMEY STRL 70CC (SYRINGE) ×1 IMPLANT
TUBING CONNECTING 10 (TUBING) ×2 IMPLANT

## 2015-09-02 NOTE — Anesthesia Procedure Notes (Signed)
Procedure Name: LMA Insertion Date/Time: 09/02/2015 7:37 AM Performed by: Talbot Grumbling Pre-anesthesia Checklist: Patient identified, Emergency Drugs available, Suction available and Patient being monitored Patient Re-evaluated:Patient Re-evaluated prior to inductionOxygen Delivery Method: Circle system utilized Preoxygenation: Pre-oxygenation with 100% oxygen Intubation Type: IV induction Ventilation: Mask ventilation without difficulty LMA: LMA inserted LMA Size: 5.0 Number of attempts: 1 Placement Confirmation: positive ETCO2 and breath sounds checked- equal and bilateral Tube secured with: Tape Dental Injury: Teeth and Oropharynx as per pre-operative assessment

## 2015-09-02 NOTE — Progress Notes (Signed)
Paged Dr Karsten Ro to clarify rapaflo and flomax. Patient is to use up all samples of rapaflo then start flomax. He will not take both medications at the same time.  [name] [name]- significant other is legally blind. Son, Quita Skye, is to drive them home.

## 2015-09-02 NOTE — Discharge Instructions (Signed)
Post stone removal/stent placement surgery instructions   Definitions:  Ureter: The duct that transports urine from the kidney to the bladder. Stent: A plastic hollow tube that is placed into the ureter, from the kidney to the bladder to prevent the ureter from swelling shut.  General instructions:  Despite the fact that no skin incisions were used, the area around the ureter and bladder is raw and irritated. The stent is a foreign body which will further irritate the bladder wall. This irritation is manifested by increased frequency of urination, both day and night, and by an increase in the urge to urinate. In some, the urge to urinate is present almost always. Sometimes the urge is strong enough that you may not be able to stop your self from urinating. The only real cure is to remove the stent and then give time for the bladder wall to heal which can't be done until the danger of the ureter swelling shut has passed. (This varies from 2-21 days).  You may see some blood in your urine while the stent is in place and a few days afterward. Do not be alarmed, even if the urine is clear for a while. Get off your feet and drink lots of fluids until clearing occurs. If you start to pass clots or don't improve, call us.  If you have a string coming from your urethra:  The stent string is attached to your ureteral stent.  Do not pull on thisIf you have a string coming from your urethra:  The stent string is attached to your ureteral stent.  Do not pull on this.  Diet:  You may return to your normal diet immediately. Because of the raw surface of your bladder, alcohol, spicy foods, foods high in acid and drinks with caffeine may cause irritation or frequency and should be used in moderation. To keep your urine flowing freely and avoid constipation, drink plenty of fluids during the day (8-10 glasses). Tip: Avoid cranberry juice because it is very acidic.  Activity:  Your physical activity doesn't need  to be restricted. However, if you are very active, you may see some blood in the urine. We suggest that you reduce your activity under the circumstances until the bleeding has stopped.  Bowels:  It is important to keep your bowels regular during the postoperative period. Straining with bowel movements can cause bleeding. A bowel movement every other day is reasonable. Use a mild laxative if needed, such as milk of magnesia 2-3 tablespoons, or 2 Dulcolax tablets. Call if you continue to have problems. If you had been taking narcotics for pain, before, during or after your surgery, you may be constipated. Take a laxative if necessary.     Medication:  You should resume your pre-surgery medications unless told not to. DO NOT RESUME YOUR ASPIRIN, or any other medicines like ibuprofen, motrin, excedrin, advil, aleve, vitamin E, fish oil as these can all cause bleeding x 7 days. In addition you may be given an antibiotic to prevent or treat infection. Antibiotics are not always necessary. All medication should be taken as prescribed until the bottles are finished unless you are having an unusual reaction to one of the drugs.  Problems you should report to [place]:  a. Fever greater than 101F. b. Heavy bleeding, or clots (see notes above about blood in urine). c. Inability to urinate. d. Drug reactions (hives, rash, nausea, vomiting, diarrhea). e. Severe burning or pain with urination that is not improving.  Followup:  You will need a followup appointment to monitor your progress in most cases. Please call the office for this appointment when you get home if your appointment has not already been scheduled. Usually the first appointment will be about 5-14 days after your surgery and if you have a stent in place it will likely be removed at that time.    General Anesthesia, Adult, Care After Refer to this sheet in the next few weeks. These instructions provide you with information on caring for  yourself after your procedure. Your health care provider may also give you more specific instructions. Your treatment has been planned according to current medical practices, but problems sometimes occur. Call your health care provider if you have any problems or questions after your procedure. WHAT TO EXPECT AFTER THE PROCEDURE After the procedure, it is typical to experience:  Sleepiness.  Nausea and vomiting. HOME CARE INSTRUCTIONS  For the first 24 hours after general anesthesia:  Have a responsible person with you.  Do not drive a car. If you are alone, do not take public transportation.  Do not drink alcohol.  Do not take medicine that has not been prescribed by your health care provider.  Do not sign important papers or make important decisions.  You may resume a normal diet and activities as directed by your health care provider.  Change bandages (dressings) as directed.  If you have questions or problems that seem related to general anesthesia, call the hospital and ask for the anesthetist or anesthesiologist on call. SEEK MEDICAL CARE IF:  You have nausea and vomiting that continue the day after anesthesia.  You develop a rash. SEEK IMMEDIATE MEDICAL CARE IF:   You have difficulty breathing.  You have chest pain.  You have any allergic problems.   This information is not intended to replace advice given to you by your health care provider. Make sure you discuss any questions you have with your health care provider.   Document Released: 06/08/2000 Document Revised: 03/23/2014 Document Reviewed: 07/01/2011 Elsevier Interactive Patient Education [employer].

## 2015-09-02 NOTE — Transfer of Care (Signed)
Immediate Anesthesia Transfer of Care Note  Patient: [name] [name]  Procedure(s) Performed: Procedure(s): CYSTOSCOPY BILATERAL STENT REMOVAL BILATERAL RETROGRADE PYELOGRAM, BILATERAL URETEROSCOPY AND BILATERAL STENT PLACEMENT (Bilateral) BILATERAL HOLMIUM LASER  LITHO  (Bilateral)  Patient Location: PACU  Anesthesia Type:General  Level of Consciousness:  sedated, patient cooperative and responds to stimulation  Airway & Oxygen Therapy:Patient Spontanous Breathing and Patient connected to face mask oxgen  Post-op Assessment:  Report given to PACU RN and Post -op Vital signs reviewed and stable  Post vital signs:  Reviewed and stable  Last Vitals:  Filed Vitals:   09/02/15 0527  BP: 139/72  Pulse: 81  Temp: 36.6 C  Resp: 18    Complications: No apparent anesthesia complications

## 2015-09-02 NOTE — Anesthesia Postprocedure Evaluation (Signed)
Anesthesia Post Note  Patient: [name] [name]  Procedure(s) Performed: Procedure(s) (LRB): CYSTOSCOPY BILATERAL STENT REMOVAL BILATERAL RETROGRADE PYELOGRAM, BILATERAL URETEROSCOPY AND BILATERAL STENT PLACEMENT (Bilateral) BILATERAL HOLMIUM LASER  LITHO  (Bilateral)  Patient location during evaluation: PACU Anesthesia Type: General Level of consciousness: awake and alert Pain management: pain level controlled Vital Signs Assessment: post-procedure vital signs reviewed and stable Respiratory status: spontaneous breathing, nonlabored ventilation, respiratory function stable and patient connected to nasal cannula oxygen Cardiovascular status: blood pressure returned to baseline and stable Postop Assessment: no signs of nausea or vomiting Anesthetic complications: no    Last Vitals:  Filed Vitals:   09/02/15 1105 09/02/15 1206  BP: 131/84 110/61  Pulse: 88 104  Temp: 36.6 C 36.4 C  Resp: 16 16    Last Pain:  Filed Vitals:   09/02/15 1208  PainSc: 0-No pain                 Louvenia Golomb,JAMES TERRILL

## 2015-09-02 NOTE — Op Note (Signed)
PATIENT:  [name] [name]  PRE-OPERATIVE DIAGNOSIS: 1. Bilateral Ureteral calcului 2. Bilateral double-J stents  POST-OPERATIVE DIAGNOSIS: Same  PROCEDURE:  1. Removal of double-J stents 2. Retrograde pyelograms with interpretation 3. Bilateral ureteroscopy with stone extraction 4. Left ureteral laser lithotripsy of calculus. 5. Bilateral double-J stent placement  SURGEON: Claybon Jabs, MD  INDICATION: [name] [name] is a 81 year old male who developed acute renal insufficiency secondary to bilateral ureteral calculi and underwent urgent stent placement. His creatinine fell precipitously and he has returned for management of his ureteral stones now that he is improved medically.  ANESTHESIA:  General  EBL:  Minimal  DRAINS: 6 French, 24 cm double-J stents and right and left ureters (strings on both stents tied together.)  SPECIMEN:  Stone taken to my office for composition analysis.  DESCRIPTION OF PROCEDURE: The patient was taken to the major OR and placed on the table. General anesthesia was administered and then the patient was moved to the dorsal lithotomy position. The genitalia was sterilely prepped and draped. An official timeout was performed.  Initially the 68 French cystoscope with 30 lens was passed under direct vision into the bladder. The bladder was then fully inspected. It was noted be free of any tumors, stones or inflammatory lesions. Ureteral orifices were of normal configuration and position. I noted the stent exiting the left ureter and grasped this with alligator forceps and withdrew to the urethral meatus. I then passed a 0.038 inch floppy-tipped guidewire through the stent and up the left ureter under fluoroscopy. This was left in placed and the stent was removed.  I then passed a 6 [place] open-ended catheter over the guidewire and into the left ureter and remove the guidewire. I performed a left retrograde pyelogram in the standard fashion using full-strength  contrast injected through the open-ended catheter. It revealed the ureter was normal throughout its length with a filling defect in the proximal ureter consistent with the stone seen on preoperative KUB.  The guidewire was then passed through the open-ended catheter, beyond the stone and into the area the renal pelvis. I then passed the 6 French flexible ureteroscope over the guidewire and up to the stone.The stone was identified and I felt it was too large to extract and therefore elected to proceed with laser lithotripsy. The 200  holmium laser fiber was used to fragment the stone. I then used the nitinol basket to extract all of the stone fragments and reinspection of the ureter ureteroscopically revealed no further stone fragments and no injury to the ureter.   I then directed my attention to the right ureter and cystoscopically I had noted that although the stent appeared to possibly have migrated up the ureter there was a portion of the stent exiting the right ureteral orifice and therefore I grasped this with alligator forceps and withdrew to the tip of the meatus where a guidewire was then passed through the stent and into the area the renal pelvis under fluoroscopy removing the stent and leaving the guidewire in place. I then used the 6 [place] rigid ureteroscope and passed this up the ureter and noted a stone at the ureteropelvic junction. I initially tried to engage the stone but was unsuccessful as it fell back into the kidney so I switched to the flexible ureteroscope and passed this over the guidewire.   I passed the ureteroscope into the area of the right kidney and noted no abnormality of the upper pole or middle pole calyces. In a lower pole  calyx was a large stone that appeared to be adherent noted on preop KUB. With the flexible ureteroscope I was able to identify the stone and engaged it in the nitinol basket. I then was able to orient it along the length of the ureter and was able to  extract the stone without difficulty. I then proceeded to place bilateral double-J stents.  With the guidewires that were previously left in place I backloaded the cystoscope over the guidewire and passed the stent over the guidewire into the area the renal pelvis. This was performed first on the left side and then the right side. Good curl was noted in the renal pelvis on both sides. There were a lot of stone fragments that I had removed from the ureter on the floor the bladder and I used a [place] syringe to extract these. I then drained the bladder and I tied both of the tethers on the distal aspects of both stents together and these were taped to the dorsum of the penis. The patient was awakened and taken to the recovery room in stable and satisfactory condition. He tolerated procedure well no intraoperative complications.  PLAN OF CARE: Discharge to home after PACU  PATIENT DISPOSITION:  PACU - hemodynamically stable.

## 2015-09-04 ENCOUNTER — Encounter: Payer: Self-pay | Admitting: *Deleted

## 2015-09-06 ENCOUNTER — Ambulatory Visit
Admission: RE | Admit: 2015-09-06 | Discharge: 2015-09-06 | Disposition: A | Discharge status: Home / Self Care | Payer: Medicare HMO | Source: Ambulatory Visit | Attending: Internal Medicine | Admitting: Internal Medicine

## 2015-09-06 ENCOUNTER — Ambulatory Visit (INDEPENDENT_AMBULATORY_CARE_PROVIDER_SITE_OTHER): Payer: Commercial Managed Care - HMO | Admitting: Internal Medicine

## 2015-09-06 ENCOUNTER — Encounter: Payer: Self-pay | Admitting: Internal Medicine

## 2015-09-06 VITALS — BP 118/76 | HR 96 | Temp 97.9°F | Ht 70.0 in | Wt 225.8 lb

## 2015-09-06 DIAGNOSIS — M25562 Pain in left knee: Secondary | ICD-10-CM | POA: Diagnosis not present

## 2015-09-06 DIAGNOSIS — R350 Frequency of micturition: Secondary | ICD-10-CM | POA: Insufficient documentation

## 2015-09-06 DIAGNOSIS — R251 Tremor, unspecified: Secondary | ICD-10-CM

## 2015-09-06 DIAGNOSIS — N179 Acute kidney failure, unspecified: Secondary | ICD-10-CM

## 2015-09-06 DIAGNOSIS — E1129 Type 2 diabetes mellitus with other diabetic kidney complication: Secondary | ICD-10-CM | POA: Diagnosis not present

## 2015-09-06 DIAGNOSIS — E1169 Type 2 diabetes mellitus with other specified complication: Secondary | ICD-10-CM | POA: Diagnosis not present

## 2015-09-06 DIAGNOSIS — Z87898 Personal history of other specified conditions: Secondary | ICD-10-CM

## 2015-09-06 DIAGNOSIS — M179 Osteoarthritis of knee, unspecified: Secondary | ICD-10-CM | POA: Diagnosis not present

## 2015-09-06 DIAGNOSIS — E785 Hyperlipidemia, unspecified: Secondary | ICD-10-CM

## 2015-09-06 DIAGNOSIS — Z87448 Personal history of other diseases of urinary system: Secondary | ICD-10-CM | POA: Diagnosis not present

## 2015-09-06 DIAGNOSIS — I10 Essential (primary) hypertension: Secondary | ICD-10-CM | POA: Diagnosis not present

## 2015-09-06 MED ORDER — TETANUS-DIPHTH-ACELL PERTUSSIS 5-2.5-18.5 LF-MCG/0.5 IM SUSP: 0.5000 mL | Freq: Once | INTRAMUSCULAR | Status: DC

## 2015-09-06 MED ORDER — ZOSTER VACCINE LIVE 19400 UNT/0.65ML ~~LOC~~ SUSR: 0.6500 mL | Freq: Once | SUBCUTANEOUS | Status: DC

## 2015-09-06 NOTE — Patient Instructions (Signed)
Get xray left knee and will call with results  Continue current medications as ordered. May change metformin if kidney function still reduced  Will call with lab results   Get old records from previous PCP  Follow up in 3 mos for routine visit.

## 2015-09-06 NOTE — Addendum Note (Signed)
Addended by: Moshe Cipro, Ghazal Pevey A on: 09/06/2015 11:53 AM   Modules accepted: Orders

## 2015-09-06 NOTE — Progress Notes (Signed)
Patient ID: [name] [name], male   DOB: 12/10/1934, 81 y.o.   MRN: [id number]    Location:  [facility] Place of Service: OFFICE    Advanced Directive information Does patient have an advance directive?: No, Would patient like information on creating an advanced directive?: Yes - Educational materials given  Chief Complaint  Patient presents with  . Establish Care    would like to discuss Advance directive     HPI:  81 yo male seen today as a new pt. He was admitted to the hospital last month for hyperkalemia, AKI, b/l acute obstructive uropathy with acute urinary retention, HTN, DM. KUB revealed b/l staghorn calculi. Urology followed. Cr 10.71 with K+ 6.5; Tbili 1.3. Double J stent placed with removal of calculi on 5/27th. D/c Cr 2.85  With K+ 3.6. He was d/c'd with foley cath. ACEI, metformin and NSAIDS held at d/c. He removed foley cath at home as instructed 5 days after d/c. He then had cystoscopy with b/l retrograde pyelograms, b/l double J stent placed, and cystolithlapaxy and removal of bladder calculi on 6/19th by Dr Karsten Ro. He has f/u appt next week for removal of double J stent. He continues to have nocturia (every 2 hrs), dysuria. No hx BPH. No Fhx kidney stones or kidney disease  DM - A1c 8%. Takes metformin. He does not check BS at home. He does not have a glucometer. He is on a "strict" diet  Urinary retention - followed by urology. Takes flomax and has taken rapaflo in the past.  HTN - stable on lisinopril hct  Hyperlipidemia - takes simvastatin  Tremor - intermittent x several yrs. He does not take any meds for it or had formal eval. No FHx parkinson's   Past Medical History  Diagnosis Date  . Prostate disease   . Diabetes mellitus without complication ([place])     type 2  . Arthritis   . Renal disorder     kidney stones    Past Surgical History  Procedure Laterality Date  . Cystoscopy w/ ureteral stent placement Bilateral 08/10/2015    Procedure: CYSTOSCOPY WITH  BILATERAL RETROGRADE PYELOGRAM/BILATERAL URETERAL STENT PLACEMENT;  Surgeon: Kathie Rhodes, MD;  Location: [facility];  Service: Urology;  Laterality: Bilateral;  . Cystoscopy with retrograde pyelogram, ureteroscopy and stent placement Bilateral 09/02/2015    Procedure: CYSTOSCOPY BILATERAL STENT REMOVAL BILATERAL RETROGRADE PYELOGRAM, BILATERAL URETEROSCOPY AND BILATERAL STENT PLACEMENT;  Surgeon: Kathie Rhodes, MD;  Location: [facility];  Service: Urology;  Laterality: Bilateral;  . Holmium laser application Bilateral 0/76/2263    Procedure: BILATERAL HOLMIUM LASER  LITHO ;  Surgeon: Kathie Rhodes, MD;  Location: [facility];  Service: Urology;  Laterality: Bilateral;  . Joint replacement Right 2007    hip, Dr. Alvan Dame  . Tonsillectomy Bilateral     70 years ago    Patient Care Team: Gildardo Cranker, DO as PCP - General (Internal Medicine)  Social History   Social History  . Marital Status: Single    Spouse Name: N/A  . Number of Children: N/A  . Years of Education: N/A   Occupational History  . Not on file.   Social History Main Topics  . Smoking status: Never Smoker   . Smokeless tobacco: Not on file  . Alcohol Use: No  . Drug Use: Not on file  . Sexual Activity: Not on file   Other Topics Concern  . Not on file   Social History Narrative   Social History     Marital  Status: Single              Spouse Name:                        Years of Education:                 Number of children:  0              Occupational History     None on file      Social History Main Topics     Smoking Status: Never Smoker                       Smokeless Status: Not on file                        Alcohol Use: No               Drug Use: Not on file      Sexual Activity: Not on file            Other Topics            Concern     None on file      Social History Narrative   Patient lives in a 1 story house, 4 people presently live in the home. He does have 1 dog and cat has pets.                  reports that he has never smoked. He does not have any smokeless tobacco history on file. He reports that he does not drink alcohol. His drug history is not on file.  History reviewed. No pertinent family history. No family status information on file.    There is no immunization history for the selected administration types on file for this patient.  No Known Allergies  Medications: Patient's Medications  New Prescriptions   No medications on file  Previous Medications   HYDROCODONE-ACETAMINOPHEN (NORCO) 10-325 MG TABLET    Take 1-2 tablets by mouth every 4 (four) hours as needed for moderate pain. Maximum dose per 24 hours - 8 pills   LISINOPRIL-HYDROCHLOROTHIAZIDE (PRINZIDE,ZESTORETIC) 20-12.5 MG TABLET    Take 1 tablet by mouth daily.   METFORMIN (GLUCOPHAGE) 1000 MG TABLET    Take 1,000 mg by mouth 2 (two) times daily with a meal.   NAPROXEN SODIUM (ANAPROX) 220 MG TABLET    Take 220 mg by mouth 2 (two) times daily with a meal. Take 2 tablets daily.   PHENAZOPYRIDINE (PYRIDIUM) 200 MG TABLET    Take 1 tablet (200 mg total) by mouth 3 (three) times daily as needed for pain.   SILODOSIN (RAPAFLO) 8 MG CAPS CAPSULE    Take 8 mg by mouth every evening.   SIMVASTATIN (ZOCOR) 20 MG TABLET    Take 20 mg by mouth at bedtime.    TAMSULOSIN (FLOMAX) 0.4 MG CAPS CAPSULE    Take 1 capsule (0.4 mg total) by mouth daily after supper.  Modified Medications   Modified Medication Previous Medication   TDAP (BOOSTRIX) 5-2.5-18.5 LF-MCG/0.5 INJECTION Tdap (BOOSTRIX) 5-2.5-18.5 LF-MCG/0.5 injection      Inject 0.5 mLs into the muscle once.    Inject 0.5 mLs into the muscle once.   ZOSTER VACCINE LIVE, PF, (ZOSTAVAX) 76720 UNT/0.65ML INJECTION Zoster Vaccine Live, PF, (ZOSTAVAX) 94709 UNT/0.65ML injection      Inject 19,400 Units into  the skin once.    Inject 0.65 mLs into the skin once.  Discontinued Medications   ASPIRIN 325 MG TABLET    Take 325 mg by mouth daily. Reported on 09/06/2015   ASPIRIN EC  81 MG TABLET    Take 81 mg by mouth daily. Reported on 09/06/2015    Review of Systems  Genitourinary: Positive for dysuria and urgency.  Musculoskeletal: Positive for joint swelling, arthralgias and gait problem.       Left knee grinds  Neurological: Positive for tremors.  All other systems reviewed and are negative.   Filed Vitals:   09/06/15 0825  BP: 118/76  Pulse: 96  Temp: 97.9 F (36.6 C)  TempSrc: Oral  Height: 5' 10"  (1.778 m)  Weight: 225 lb 12.8 oz (102.422 kg)  SpO2: 97%   Body mass index is 32.4 kg/(m^2).  Physical Exam  Constitutional: He is oriented to person, place, and time. He appears well-developed and well-nourished.  HENT:  Mouth/Throat: Oropharynx is clear and moist.  Eyes: Pupils are equal, round, and reactive to light. No scleral icterus.  Neck: Neck supple. Carotid bruit is not present. No thyromegaly present.  Cardiovascular: Normal rate, regular rhythm, normal heart sounds and intact distal pulses.  Exam reveals no gallop and no friction rub.   No murmur heard. no distal LE swelling. No calf TTP  Pulmonary/Chest: Effort normal and breath sounds normal. He has no wheezes. He has no rales. He exhibits no tenderness.  Abdominal: Soft. Bowel sounds are normal. He exhibits no distension, no abdominal bruit, no pulsatile midline mass and no mass. There is no tenderness. There is no rebound and no guarding. A hernia (small reducible umbilcal, NT) is present.  Musculoskeletal: He exhibits edema and tenderness.  Lymphadenopathy:    He has no cervical adenopathy.  Neurological: He is alert and oriented to person, place, and time. Tremors: left hand pill rolling. Gait abnormal.  Gait antalgic  Skin: Skin is warm and dry. No rash noted.  Psychiatric: He has a normal mood and affect. His behavior is normal. Judgment and thought content normal.     Labs reviewed: Admission on 09/02/2015, Discharged on 09/02/2015  Component Date Value Ref Range Status  .  Glucose-Capillary 09/02/2015 155* 65 - 99 mg/dL Final  . Glucose-Capillary 09/02/2015 145* 65 - 99 mg/dL Final  Hospital Outpatient Visit on 08/29/2015  Component Date Value Ref Range Status  . Hgb A1c MFr Bld 08/29/2015 8.0* 4.8 - 5.6 % Final   Comment: (NOTE)         Pre-diabetes: 5.7 - 6.4         Diabetes: >6.4         Glycemic control for adults with diabetes: <7.0   . Mean Plasma Glucose 08/29/2015 183   Final   Comment: (NOTE) Performed At: [facility] [place], [place] [id number] Lindon Romp MD [id number]   Admission on 08/10/2015, Discharged on 08/12/2015  Component Date Value Ref Range Status  . Sodium 08/10/2015 132* 135 - 145 mmol/L Final  . Potassium 08/10/2015 6.5* 3.5 - 5.1 mmol/L Final   Comment: SLIGHT HEMOLYSIS ROSSER,M AT 1320 ON [id number] BY HOOKER,B CRITICAL RESULT CALLED TO, READ BACK BY AND VERIFIED WITH:   . Chloride 08/10/2015 108  101 - 111 mmol/L Final  . CO2 08/10/2015 10* 22 - 32 mmol/L Final  . Glucose, Bld 08/10/2015 105* 65 - 99 mg/dL Final  . BUN 08/10/2015 107* 6 - 20 mg/dL Final  RESULTS CONFIRMED BY MANUAL DILUTION  . Creatinine, Ser 08/10/2015 10.71* 0.61 - 1.24 mg/dL Final  . Calcium 08/10/2015 8.8* 8.9 - 10.3 mg/dL Final  . Total Protein 08/10/2015 6.4* 6.5 - 8.1 g/dL Final  . Albumin 08/10/2015 3.6  3.5 - 5.0 g/dL Final  . AST 08/10/2015 27  15 - 41 U/L Final  . ALT 08/10/2015 27  17 - 63 U/L Final  . Alkaline Phosphatase 08/10/2015 85  38 - 126 U/L Final  . Total Bilirubin 08/10/2015 1.3* 0.3 - 1.2 mg/dL Final  . GFR calc non Af Amer 08/10/2015 4* >60 mL/min Final  . GFR calc Af Amer 08/10/2015 5* >60 mL/min Final   Comment: (NOTE) The eGFR has been calculated using the CKD EPI equation. This calculation has not been validated in all clinical situations. eGFR's persistently <60 mL/min signify possible Chronic Kidney Disease.   . Anion gap 08/10/2015 14  5 - 15 Final  . WBC 08/10/2015 11.2* 4.0 - 10.5  K/uL Final  . RBC 08/10/2015 4.41  4.22 - 5.81 MIL/uL Final  . Hemoglobin 08/10/2015 14.2  13.0 - 17.0 g/dL Final  . HCT 08/10/2015 41.0  39.0 - 52.0 % Final  . MCV 08/10/2015 93.0  78.0 - 100.0 fL Final  . MCH 08/10/2015 32.2  26.0 - 34.0 pg Final  . MCHC 08/10/2015 34.6  30.0 - 36.0 g/dL Final  . RDW 08/10/2015 13.1  11.5 - 15.5 % Final  . Platelets 08/10/2015 229  150 - 400 K/uL Final  . Neutrophils Relative % 08/10/2015 81   Final  . Neutro Abs 08/10/2015 9.1* 1.7 - 7.7 K/uL Final  . Lymphocytes Relative 08/10/2015 12   Final  . Lymphs Abs 08/10/2015 1.3  0.7 - 4.0 K/uL Final  . Monocytes Relative 08/10/2015 6   Final  . Monocytes Absolute 08/10/2015 0.6  0.1 - 1.0 K/uL Final  . Eosinophils Relative 08/10/2015 1   Final  . Eosinophils Absolute 08/10/2015 0.1  0.0 - 0.7 K/uL Final  . Basophils Relative 08/10/2015 0   Final  . Basophils Absolute 08/10/2015 0.0  0.0 - 0.1 K/uL Final  . Lipase 08/10/2015 60* 11 - 51 U/L Final  . Troponin i, poc 08/10/2015 0.04  0.00 - 0.08 ng/mL Final  . Comment 3 08/10/2015          Final   Comment: Due to the release kinetics of cTnI, a negative result within the first hours of the onset of symptoms does not rule out myocardial infarction with certainty. If myocardial infarction is still suspected, repeat the test at appropriate intervals.   . Color, Urine 08/10/2015 YELLOW  YELLOW Final  . APPearance 08/10/2015 CLEAR  CLEAR Final  . Specific Gravity, Urine 08/10/2015 1.013  1.005 - 1.030 Final  . pH 08/10/2015 5.0  5.0 - 8.0 Final  . Glucose, UA 08/10/2015 NEGATIVE  NEGATIVE mg/dL Final  . Hgb urine dipstick 08/10/2015 MODERATE* NEGATIVE Final  . Bilirubin Urine 08/10/2015 NEGATIVE  NEGATIVE Final  . Ketones, ur 08/10/2015 NEGATIVE  NEGATIVE mg/dL Final  . Protein, ur 08/10/2015 NEGATIVE  NEGATIVE mg/dL Final  . Nitrite 08/10/2015 NEGATIVE  NEGATIVE Final  . Leukocytes, UA 08/10/2015 SMALL* NEGATIVE Final  . Glucose-Capillary 08/10/2015 99   65 - 99 mg/dL Final  . Sodium 08/10/2015 135  135 - 145 mmol/L Final  . Potassium 08/10/2015 5.3* 3.5 - 5.1 mmol/L Final   Comment: DELTA CHECK NOTED REPEATED TO VERIFY   . Chloride 08/10/2015 111  101 -  111 mmol/L Final  . CO2 08/10/2015 12* 22 - 32 mmol/L Final  . Glucose, Bld 08/10/2015 117* 65 - 99 mg/dL Final  . BUN 08/10/2015 105* 6 - 20 mg/dL Final   RESULTS CONFIRMED BY MANUAL DILUTION  . Creatinine, Ser 08/10/2015 9.40* 0.61 - 1.24 mg/dL Final  . Calcium 08/10/2015 8.2* 8.9 - 10.3 mg/dL Final  . GFR calc non Af Amer 08/10/2015 5* >60 mL/min Final  . GFR calc Af Amer 08/10/2015 5* >60 mL/min Final   Comment: (NOTE) The eGFR has been calculated using the CKD EPI equation. This calculation has not been validated in all clinical situations. eGFR's persistently <60 mL/min signify possible Chronic Kidney Disease.   . Anion gap 08/10/2015 12  5 - 15 Final  . Squamous Epithelial / LPF 08/10/2015 0-5* NONE SEEN Final  . WBC, UA 08/10/2015 0-5  0 - 5 WBC/hpf Final  . RBC / HPF 08/10/2015 TOO NUMEROUS TO COUNT  0 - 5 RBC/hpf Final  . Bacteria, UA 08/10/2015 RARE* NONE SEEN Final  . Glucose-Capillary 08/10/2015 102* 65 - 99 mg/dL Final  . Sodium 08/11/2015 138  135 - 145 mmol/L Final  . Potassium 08/11/2015 5.0  3.5 - 5.1 mmol/L Final  . Chloride 08/11/2015 114* 101 - 111 mmol/L Final  . CO2 08/11/2015 15* 22 - 32 mmol/L Final  . Glucose, Bld 08/11/2015 196* 65 - 99 mg/dL Final  . BUN 08/11/2015 88* 6 - 20 mg/dL Final  . Creatinine, Ser 08/11/2015 6.33* 0.61 - 1.24 mg/dL Final  . Calcium 08/11/2015 8.6* 8.9 - 10.3 mg/dL Final  . GFR calc non Af Amer 08/11/2015 8* >60 mL/min Final  . GFR calc Af Amer 08/11/2015 9* >60 mL/min Final   Comment: (NOTE) The eGFR has been calculated using the CKD EPI equation. This calculation has not been validated in all clinical situations. eGFR's persistently <60 mL/min signify possible Chronic Kidney Disease.   . Anion gap 08/11/2015 9  5 - 15  Final  . WBC 08/11/2015 10.5  4.0 - 10.5 K/uL Final  . RBC 08/11/2015 4.30  4.22 - 5.81 MIL/uL Final  . Hemoglobin 08/11/2015 13.5  13.0 - 17.0 g/dL Final  . HCT 08/11/2015 39.6  39.0 - 52.0 % Final  . MCV 08/11/2015 92.1  78.0 - 100.0 fL Final  . MCH 08/11/2015 31.4  26.0 - 34.0 pg Final  . MCHC 08/11/2015 34.1  30.0 - 36.0 g/dL Final  . RDW 08/11/2015 13.4  11.5 - 15.5 % Final  . Platelets 08/11/2015 237  150 - 400 K/uL Final  . Glucose-Capillary 08/10/2015 181* 65 - 99 mg/dL Final  . Glucose-Capillary 08/11/2015 150* 65 - 99 mg/dL Final  . Comment 1 08/11/2015 Notify RN   Final  . Comment 2 08/11/2015 Document in Chart   Final  . Glucose-Capillary 08/11/2015 161* 65 - 99 mg/dL Final  . Comment 1 08/11/2015 Notify RN   Final  . Comment 2 08/11/2015 Document in Chart   Final  . Sodium 08/12/2015 137  135 - 145 mmol/L Final  . Potassium 08/12/2015 3.6  3.5 - 5.1 mmol/L Final   Comment: RESULT REPEATED AND VERIFIED DELTA CHECK NOTED   . Chloride 08/12/2015 113* 101 - 111 mmol/L Final  . CO2 08/12/2015 17* 22 - 32 mmol/L Final  . Glucose, Bld 08/12/2015 121* 65 - 99 mg/dL Final  . BUN 08/12/2015 58* 6 - 20 mg/dL Final  . Creatinine, Ser 08/12/2015 2.85* 0.61 - 1.24 mg/dL Final   Comment:  RESULT REPEATED AND VERIFIED DELTA CHECK NOTED   . Calcium 08/12/2015 8.3* 8.9 - 10.3 mg/dL Final  . GFR calc non Af Amer 08/12/2015 20* >60 mL/min Final  . GFR calc Af Amer 08/12/2015 23* >60 mL/min Final   Comment: (NOTE) The eGFR has been calculated using the CKD EPI equation. This calculation has not been validated in all clinical situations. eGFR's persistently <60 mL/min signify possible Chronic Kidney Disease.   . Anion gap 08/12/2015 7  5 - 15 Final  . Glucose-Capillary 08/11/2015 144* 65 - 99 mg/dL Final  . Comment 1 08/11/2015 Notify RN   Final  . Comment 2 08/11/2015 Document in Chart   Final  . Glucose-Capillary 08/11/2015 147* 65 - 99 mg/dL Final  . Glucose-Capillary 08/12/2015  130* 65 - 99 mg/dL Final  . Sodium 08/10/2015 137  135 - 145 mmol/L Final  . Potassium 08/10/2015 5.0  3.5 - 5.1 mmol/L Final  . Glucose, Bld 08/10/2015 68  65 - 99 mg/dL Final  . HCT 08/10/2015 37.0* 39.0 - 52.0 % Final  . Hemoglobin 08/10/2015 12.6* 13.0 - 17.0 g/dL Final    [facility] Day Of Procedure  09/02/2015  CLINICAL DATA:  Initial valuation pre lithotripsy. EXAM: ABDOMEN - 1 VIEW COMPARISON:  Prior radiograph from 08/27/2015. FINDINGS: Bilateral double-J ureteral stents remain in place, stable. Multiple right-sided renal calculi again seen, largest of which measures 17 mm. Multiple calcifications overlie the left kidney, largest of which is somewhat globular in appearance and measures up to 2.8 cm. Additional 14 mm calcification overlies the region of the mid left ureter just medial to the left ureteral stent. No calcifications overlie the bladder. No other definite stones along the course of the renal collecting systems. Visualized bowel gas pattern within normal limits without obstruction or ileus. Scoliosis with multilevel degenerative changes and within the visualized spine. Right hip arthroplasty in place. Degenerative osteoarthritic changes noted about the left hip. IMPRESSION: 1. Bilateral nephrolithiasis, measuring up to 2.8 cm on the left and 1.7 cm on the right. Additional 1.4 cm calcification overlies the region of the mid left ureter. 2. Bilateral ureteral stents in place. Electronically Signed   By: Jeannine Boga M.D.   On: 09/02/2015 06:29   Dg Chest Portable 1 View  08/10/2015  CLINICAL DATA:  Preoperative evaluation. EXAM: PORTABLE CHEST 1 VIEW COMPARISON:  08/08/2015 FINDINGS: Cardiac shadow is stable. The lungs are well aerated bilaterally. No focal infiltrate or sizable effusion is seen. Minimal scarring is again noted in the bases bilaterally. No acute bony abnormality is seen. IMPRESSION: No active disease. Electronically Signed   By: Inez Catalina M.D.   On: 08/10/2015  17:26   Dg Abd Acute W/chest  08/08/2015  CLINICAL DATA:  Nausea and diarrhea for 3 days, initial encounter EXAM: DG ABDOMEN ACUTE W/ 1V CHEST COMPARISON:  02/03/2006 FINDINGS: Cardiac shadow is within normal limits. Minimal scarring is noted in the bases bilaterally. No focal infiltrate is seen. Scattered large and small bowel gas is noted. Bilateral staghorn calculi are noted. Largest of these on the right measures approximately 22 mm. The entire lower pole calyx on the left is filled with stones. Additionally there is a 11 mm calcific density identified at the pelvic inlet in the expected region of the left ureter. Right hip replacement is noted. Degenerative changes of lumbar spine are seen. IMPRESSION: Bilateral staghorn calculi. Calculus in the expected region of the mid left ureter. CT urography may be helpful for further evaluation. These results will  be called to the ordering clinician or representative by the Radiologist Assistant, and communication documented in the PACS or zVision Dashboard. Electronically Signed   By: Inez Catalina M.D.   On: 08/08/2015 14:27   Ct Renal Stone Study  08/10/2015  CLINICAL DATA:  history of HTN, DM, BPH who presents to the ED for evaluation of hypokalemia and elevated creatinine. generalized abdominal pain, N/V/D two days ago and diagnosed with kidney stones. bilateral staghorn calculi and was directed to come to [facility] on 5/25. He states he decided to see his urologist, Dr. Karsten Ro, yesterday instead. He states that today he received a call to come to the ED due to his labs. At this time pt states he feels a bit queasy but otherwise unremarkable. He reports three days of generalized mild abdominal pain with associated nausea and NBNB emesis. States he has had loose stools. Denies BRBPR or black, tarry stools. He states he has trouble urinating in that he can only urinate small amounts at a time but this is not new for him. Denies new dysuria or hematuria. He denies any  chest pain or SOB. Denies new pain or muscle weakness. He states he has never had any heart issues. Pt states that at yesterday's visit he and Dr. Karsten Ro decided to keep a watchful eye on his kidney stones and defer further treatment at this time. low potassium and abnormal creatine results--"patient stated that these symptoms started 5 days ago, n/v/d and dysuria, but can not say if one side or flank area hurts worse than other one.Hx of diabetes, prostate disease, and renal disorder EXAM: CT ABDOMEN AND PELVIS WITHOUT CONTRAST TECHNIQUE: Multidetector CT imaging of the abdomen and pelvis was performed following the standard protocol without IV contrast. COMPARISON:  08/08/2015 and previous FINDINGS: Lower chest: Scattered coronary calcifications. Linear scarring or subsegmental atelectasis in the visualized lung bases. Hepatobiliary: No mass visualized on this un-enhanced exam. Pancreas: Moderate atrophy without focal lesion or ductal dilatation. Spleen: Within normal limits in size. Adrenals/Urinary Tract: Normal adrenals. Bilateral renal calculi, largest on the left in the lower pole extending centrally measuring at least 4.2 cm, on the right in the lower pole 2.3. Mild Right hydronephrosis and proximal ureterectasis to the level of a 8 mm proximal ureteral calculus. Moderate left hydronephrosis and proximal ureterectasis down to the level of a 12 mm proximal ureteral calculus. There is a cluster of small calculi near the level of the left ureteral orifice in the urinary bladder. Urinary bladder is nondistended. Stomach/Bowel: No evidence of obstruction, inflammatory process, or abnormal fluid collections. Normal appendix. Scattered descending and sigmoid colon diverticula without adjacent inflammatory/edematous change or abscess. Vascular/Lymphatic: Scattered aortoiliac arterial calcifications without aneurysm. No adenopathy localized. Reproductive: Moderate prostatic enlargement. Other: No ascites.  No free  air. Musculoskeletal: Degenerative disc disease in the visualized lower thoracic spine and L2-S1. Right hip arthroplasty, hardware incompletely visualized. IMPRESSION: 1. Bilateral nephrolithiasis and obstructing proximal ureteral calculi with hydronephrosis. 2. Descending and sigmoid diverticulosis. 3. Atherosclerosis, including aortoiliac and coronary artery disease. Please note that although the presence of coronary artery calcium documents the presence of coronary artery disease, the severity of this disease and any potential stenosis cannot be assessed on this non-gated CT examination. Assessment for potential risk factor modification, dietary therapy or pharmacologic therapy may be warranted, if clinically indicated. Electronically Signed   By: Lucrezia Europe M.D.   On: 08/10/2015 14:22     Assessment/Plan   ICD-9-CM ICD-10-CM   1. AKI (acute kidney injury) (  [place]) 584.9 N17.9 CMP  2. Left knee pain 719.46 M25.562 DG Knee Complete 4 Views Left  3. Type 2 diabetes mellitus with other diabetic kidney complication, without long-term current use of insulin ([facility])  E11.29 CMP     Microalbumin/Creatinine Ratio, Urine  4. Hyperlipidemia associated with type 2 diabetes mellitus ([place]) 250.80 E11.69 Lipid Panel   272.4 E78.5   5. Essential hypertension 401.9 I10   6. Tremor 781.0 R25.1   7. H/O urinary retention V13.09 Z87.448   8. Urinary frequency 788.41 R35.0    Get xray left knee and will call with results  Continue current medications as ordered. May change metformin if kidney function still reduced  Will call with lab results   Get old records from previous PCP Dr Jimmye Norman   Follow up in 3 mos for routine visit  Valaree Fresquez S. Perlie Gold  [facility] [address] [place], [place] [address] [phone] Cell (Monday-Friday 8 AM - 5 PM) [phone] After 5 PM and follow prompts

## 2015-09-07 LAB — LIPID PANEL
Chol/HDL Ratio: 2.8 ratio units (ref 0.0–5.0)
Cholesterol, Total: 113 mg/dL (ref 100–199)
HDL: 40 mg/dL (ref >39)
LDL Calculated: 40 mg/dL (ref 0–99)
Triglycerides: 164 mg/dL — ABNORMAL HIGH (ref 0–149)
VLDL CHOLESTEROL CAL: 33 mg/dL (ref 5–40)

## 2015-09-07 LAB — COMPREHENSIVE METABOLIC PANEL
A/G RATIO: 1.6 (ref 1.2–2.2)
ALBUMIN: 3.7 g/dL (ref 3.5–4.8)
ALK PHOS: 112 IU/L (ref 39–117)
ALT: 11 IU/L (ref 0–44)
AST: 12 IU/L (ref 0–40)
BILIRUBIN TOTAL: 0.3 mg/dL (ref 0.0–1.2)
BUN / CREAT RATIO: 15 (ref 10–24)
BUN: 25 mg/dL (ref 8–27)
CHLORIDE: 104 mmol/L (ref 96–106)
CO2: 22 mmol/L (ref 18–29)
Calcium: 9.1 mg/dL (ref 8.6–10.2)
Creatinine, Ser: 1.67 mg/dL — ABNORMAL HIGH (ref 0.76–1.27)
GFR calc non Af Amer: 39 mL/min/{1.73_m2} — ABNORMAL LOW (ref >59)
GFR, EST AFRICAN AMERICAN: 45 mL/min/{1.73_m2} — AB (ref >59)
GLOBULIN, TOTAL: 2.3 g/dL (ref 1.5–4.5)
Glucose: 197 mg/dL — ABNORMAL HIGH (ref 65–99)
Potassium: 4.7 mmol/L (ref 3.5–5.2)
SODIUM: 142 mmol/L (ref 134–144)
TOTAL PROTEIN: 6 g/dL (ref 6.0–8.5)

## 2015-09-07 LAB — MICROALBUMIN / CREATININE URINE RATIO
CREATININE, UR: 68.3 mg/dL
MICROALB/CREAT RATIO: 310 mg/g{creat} — AB (ref 0.0–30.0)
MICROALBUM., U, RANDOM: 211.7 ug/mL

## 2015-09-09 MED ORDER — LINAGLIPTIN 5 MG PO TABS: 5.0000 mg | ORAL_TABLET | Freq: Every day | ORAL | Status: DC

## 2015-09-09 NOTE — Addendum Note (Signed)
Addended by: Denyse Amass on: 09/09/2015 08:42 AM   Modules accepted: Orders, Medications

## 2015-09-10 DIAGNOSIS — N200 Calculus of kidney: Secondary | ICD-10-CM | POA: Diagnosis not present

## 2015-09-16 DIAGNOSIS — M1712 Unilateral primary osteoarthritis, left knee: Secondary | ICD-10-CM | POA: Diagnosis not present

## 2015-09-21 ENCOUNTER — Ambulatory Visit (HOSPITAL_COMMUNITY)
Admission: EM | Admit: 2015-09-21 | Discharge: 2015-09-21 | Disposition: A | Discharge status: Home / Self Care | Payer: Commercial Managed Care - HMO | Attending: Family Medicine | Admitting: Family Medicine

## 2015-09-21 ENCOUNTER — Encounter (HOSPITAL_COMMUNITY): Payer: Self-pay | Admitting: *Deleted

## 2015-09-21 DIAGNOSIS — Z23 Encounter for immunization: Secondary | ICD-10-CM

## 2015-09-21 DIAGNOSIS — S01112A Laceration without foreign body of left eyelid and periocular area, initial encounter: Secondary | ICD-10-CM | POA: Diagnosis not present

## 2015-09-21 MED ORDER — TETANUS-DIPHTH-ACELL PERTUSSIS 5-2.5-18.5 LF-MCG/0.5 IM SUSP
0.5000 mL | Freq: Once | INTRAMUSCULAR | Status: AC
  Administered 2015-09-21: 0.5 mL via INTRAMUSCULAR

## 2015-09-21 MED ORDER — TETANUS-DIPHTH-ACELL PERTUSSIS 5-2.5-18.5 LF-MCG/0.5 IM SUSP
INTRAMUSCULAR | Status: AC
  Filled 2015-09-21: qty 0.5

## 2015-09-21 NOTE — ED Notes (Signed)
Pt  Has  A  rx  Ant  The  Pharmacy  For  tdap  And  Shingles  Vaccine   That  He  Has  Not  Had  - he  Wants  tdap  Now

## 2015-09-21 NOTE — ED Notes (Signed)
Pt  Reports  He  Felled  In the  Middle  Of the  Night   He  Sustained  A  Laceration to  l  Upper orbit area      According to    Family  He  May have  Stood  Up  And  Felled     He is  Now  Awake  And  Alert  And  Oriented      denys  Any  Chest pain or  Any  Shortness  Of breath       Bleeding  Has  Subsided

## 2015-09-21 NOTE — ED Provider Notes (Signed)
CSN: [id number]     Arrival date & time 09/21/15  1229 History   First MD Initiated Contact with Patient 09/21/15 1232     Chief Complaint  Patient presents with  . Facial Laceration   (Consider location/radiation/quality/duration/timing/severity/associated sxs/prior Treatment) Patient is a 81 y.o. male presenting with scalp laceration. The history is provided by the patient and the spouse.  Head Laceration This is a new problem. The current episode started 6 to 12 hours ago (fell out of bed this am striking face on furniture with lac to left orbit). The problem has not changed since onset.Pertinent negatives include no chest pain, no abdominal pain and no headaches.    Past Medical History  Diagnosis Date  . Prostate disease   . Diabetes mellitus without complication ([place])     type 2  . Arthritis   . Renal disorder     kidney stones   Past Surgical History  Procedure Laterality Date  . Cystoscopy w/ ureteral stent placement Bilateral 08/10/2015    Procedure: CYSTOSCOPY WITH BILATERAL RETROGRADE PYELOGRAM/BILATERAL URETERAL STENT PLACEMENT;  Surgeon: Kathie Rhodes, MD;  Location: [facility];  Service: Urology;  Laterality: Bilateral;  . Cystoscopy with retrograde pyelogram, ureteroscopy and stent placement Bilateral 09/02/2015    Procedure: CYSTOSCOPY BILATERAL STENT REMOVAL BILATERAL RETROGRADE PYELOGRAM, BILATERAL URETEROSCOPY AND BILATERAL STENT PLACEMENT;  Surgeon: Kathie Rhodes, MD;  Location: [facility];  Service: Urology;  Laterality: Bilateral;  . Holmium laser application Bilateral [id number]    Procedure: BILATERAL HOLMIUM LASER  LITHO ;  Surgeon: Kathie Rhodes, MD;  Location: [facility];  Service: Urology;  Laterality: Bilateral;  . Joint replacement Right 2007    hip, Dr. Alvan Dame  . Tonsillectomy Bilateral     70 years ago   Family History  Problem Relation Age of Onset  . Early death Neg Hx   . Kidney disease Neg Hx    Social History  Substance Use Topics  . Smoking status: Never  Smoker   . Smokeless tobacco: None  . Alcohol Use: No    Review of Systems  Constitutional: Negative.   HENT: Negative.   Cardiovascular: Negative for chest pain.  Gastrointestinal: Negative for abdominal pain.  Skin: Positive for wound. Negative for rash.  Neurological: Negative for headaches.  All other systems reviewed and are negative.   Allergies  Review of patient's allergies indicates no known allergies.  Home Medications   Prior to Admission medications   Medication Sig Start Date End Date Taking? Authorizing Provider  HYDROcodone-acetaminophen (NORCO) 10-325 MG tablet Take 1-2 tablets by mouth every 4 (four) hours as needed for moderate pain. Maximum dose per 24 hours - 8 pills 09/02/15   Kathie Rhodes, MD  linagliptin (TRADJENTA) 5 MG TABS tablet Take 1 tablet (5 mg total) by mouth daily. 09/09/15   Gildardo Cranker, DO  lisinopril-hydrochlorothiazide (PRINZIDE,ZESTORETIC) 20-12.5 MG tablet Take 1 tablet by mouth daily.    Historical Provider, MD  metFORMIN (GLUCOPHAGE) 1000 MG tablet Take 1,000 mg by mouth 2 (two) times daily with a meal.    Historical Provider, MD  naproxen sodium (ANAPROX) 220 MG tablet Take 220 mg by mouth 2 (two) times daily with a meal. Take 2 tablets daily.    Historical Provider, MD  phenazopyridine (PYRIDIUM) 200 MG tablet Take 1 tablet (200 mg total) by mouth 3 (three) times daily as needed for pain. 09/02/15   Kathie Rhodes, MD  silodosin (RAPAFLO) 8 MG CAPS capsule Take 8 mg by mouth every evening.  Historical Provider, MD  simvastatin (ZOCOR) 20 MG tablet Take 20 mg by mouth at bedtime.     Historical Provider, MD  tamsulosin (FLOMAX) 0.4 MG CAPS capsule Take 1 capsule (0.4 mg total) by mouth daily after supper. 09/02/15   Kathie Rhodes, MD  Tdap Durwin Reges) 5-2.5-18.5 LF-MCG/0.5 injection Inject 0.5 mLs into the muscle once. 09/06/15   Gildardo Cranker, DO  Zoster Vaccine Live, PF, (ZOSTAVAX) 29562 UNT/0.65ML injection Inject 19,400 Units into the skin  once. 09/06/15   Gildardo Cranker, DO   Meds Ordered and Administered this Visit   Medications  Tdap (BOOSTRIX) injection 0.5 mL (not administered)    BP 170/84 mmHg  Pulse 78  Temp(Src) 98.6 F (37 C) (Oral)  Resp 18  SpO2 98% No data found.   Physical Exam  Constitutional: He is oriented to person, place, and time. He appears well-developed and well-nourished. No distress.  Neurological: He is alert and oriented to person, place, and time.  Skin: Skin is warm and dry.  3cm left lat orbital lac  Nursing note and vitals reviewed.   ED Course  .Marland KitchenLaceration Repair Date/Time: 09/21/2015 1:12 PM Performed by: Billy Fischer Authorized by: Ihor Gully D Consent: Verbal consent obtained. Risks and benefits: risks, benefits and alternatives were discussed Consent given by: patient Body area: head/neck Location details: left eyebrow Laceration length: 3 cm Foreign bodies: no foreign bodies Vascular damage: no Patient sedated: no Irrigation solution: tap water Amount of cleaning: standard Debridement: none Degree of undermining: none Skin closure: glue Technique: simple Approximation: close Approximation difficulty: simple Patient tolerance: Patient tolerated the procedure well with no immediate complications   (including critical care time)  Labs Review Labs Reviewed - No data to display  Imaging Review No results found.   Visual Acuity Review  Right Eye Distance:   Left Eye Distance:   Bilateral Distance:    Right Eye Near:   Left Eye Near:    Bilateral Near:         MDM   1. Eyebrow laceration, left, initial encounter        Billy Fischer, MD 09/21/15 1316

## 2015-09-23 ENCOUNTER — Telehealth: Payer: Self-pay | Admitting: *Deleted

## 2015-09-23 NOTE — Telephone Encounter (Signed)
Patient called and stated that the Tradjenta is too expensive and would like for it to be changed to Glipizide to be cheaper. Please Advise.

## 2015-09-24 NOTE — Telephone Encounter (Signed)
Change to [place] 50mg  #30 take 1 tab po daily with 4 RF

## 2015-09-24 NOTE — Telephone Encounter (Signed)
Return call attempted as requested. No answer. Plan discussion with patient should they return call to [facility] general line of [phone], option 1.

## 2015-09-25 NOTE — Telephone Encounter (Signed)
LMOM to return call.

## 2015-09-30 MED ORDER — SITAGLIPTIN PHOSPHATE 50 MG PO TABS: 50.0000 mg | ORAL_TABLET | Freq: Every day | ORAL | Status: DC

## 2015-09-30 NOTE — Addendum Note (Signed)
Addended by: Rafael Bihari A on: 09/30/2015 09:15 AM   Modules accepted: Orders, Medications

## 2015-09-30 NOTE — Telephone Encounter (Signed)
Patient notified and agreed, Will discontinue the Tradjenta and start Januvia. Wants Rx faxed to [facility].

## 2015-10-02 ENCOUNTER — Telehealth: Payer: Self-pay | Admitting: *Deleted

## 2015-10-02 NOTE — Telephone Encounter (Signed)
Received fax from [facility] [phone] that stated patient's Januvia needed prior authorization and provided a Key through [email or web address]. Initiated prior authorization through the CovermyMeds and it stated that this medication is available without Authorization. Faxed this back to [facility]. Fax: [phone]

## 2015-11-11 DIAGNOSIS — N200 Calculus of kidney: Secondary | ICD-10-CM | POA: Diagnosis not present

## 2015-12-13 ENCOUNTER — Ambulatory Visit: Payer: Commercial Managed Care - HMO | Admitting: Internal Medicine

## 2016-03-23 ENCOUNTER — Telehealth: Payer: Self-pay | Admitting: Internal Medicine

## 2016-03-23 NOTE — Telephone Encounter (Signed)
I spoke w/ Mrs. to schedule AWV. She states pt is still incarcerated and will call us back to schedule. VDM (DD)

## 2016-08-08 DIAGNOSIS — R569 Unspecified convulsions: Secondary | ICD-10-CM | POA: Diagnosis not present

## 2016-08-08 DIAGNOSIS — M7500 Adhesive capsulitis of unspecified shoulder: Secondary | ICD-10-CM | POA: Diagnosis not present

## 2016-08-08 DIAGNOSIS — I10 Essential (primary) hypertension: Secondary | ICD-10-CM | POA: Diagnosis not present

## 2016-08-08 DIAGNOSIS — E860 Dehydration: Secondary | ICD-10-CM | POA: Diagnosis not present

## 2016-08-08 DIAGNOSIS — N289 Disorder of kidney and ureter, unspecified: Secondary | ICD-10-CM | POA: Diagnosis not present

## 2016-08-08 DIAGNOSIS — E1169 Type 2 diabetes mellitus with other specified complication: Secondary | ICD-10-CM | POA: Diagnosis not present

## 2016-08-09 DIAGNOSIS — E1169 Type 2 diabetes mellitus with other specified complication: Secondary | ICD-10-CM | POA: Diagnosis not present

## 2016-08-09 DIAGNOSIS — N289 Disorder of kidney and ureter, unspecified: Secondary | ICD-10-CM | POA: Diagnosis not present

## 2016-08-09 DIAGNOSIS — M7500 Adhesive capsulitis of unspecified shoulder: Secondary | ICD-10-CM | POA: Diagnosis not present

## 2016-08-09 DIAGNOSIS — E860 Dehydration: Secondary | ICD-10-CM | POA: Diagnosis not present

## 2016-08-09 DIAGNOSIS — R569 Unspecified convulsions: Secondary | ICD-10-CM | POA: Diagnosis not present

## 2016-08-09 DIAGNOSIS — I10 Essential (primary) hypertension: Secondary | ICD-10-CM | POA: Diagnosis not present

## 2016-08-10 ENCOUNTER — Emergency Department (HOSPITAL_COMMUNITY)
Admission: EM | Admit: 2016-08-10 | Discharge: 2016-08-10 | Disposition: A | Discharge status: Home / Self Care | Payer: Medicare HMO | Attending: Emergency Medicine | Admitting: Emergency Medicine

## 2016-08-10 ENCOUNTER — Encounter (HOSPITAL_COMMUNITY): Payer: Self-pay | Admitting: *Deleted

## 2016-08-10 DIAGNOSIS — Z96641 Presence of right artificial hip joint: Secondary | ICD-10-CM | POA: Diagnosis not present

## 2016-08-10 DIAGNOSIS — Z955 Presence of coronary angioplasty implant and graft: Secondary | ICD-10-CM | POA: Diagnosis not present

## 2016-08-10 DIAGNOSIS — I10 Essential (primary) hypertension: Secondary | ICD-10-CM | POA: Diagnosis not present

## 2016-08-10 DIAGNOSIS — N342 Other urethritis: Secondary | ICD-10-CM | POA: Insufficient documentation

## 2016-08-10 DIAGNOSIS — E119 Type 2 diabetes mellitus without complications: Secondary | ICD-10-CM | POA: Diagnosis not present

## 2016-08-10 DIAGNOSIS — R569 Unspecified convulsions: Secondary | ICD-10-CM | POA: Insufficient documentation

## 2016-08-10 DIAGNOSIS — Z7984 Long term (current) use of oral hypoglycemic drugs: Secondary | ICD-10-CM | POA: Insufficient documentation

## 2016-08-10 LAB — BASIC METABOLIC PANEL
Anion gap: 9 (ref 5–15)
BUN: 20 mg/dL (ref 6–20)
CHLORIDE: 109 mmol/L (ref 101–111)
CO2: 25 mmol/L (ref 22–32)
Calcium: 9.2 mg/dL (ref 8.9–10.3)
Creatinine, Ser: 1.43 mg/dL — ABNORMAL HIGH (ref 0.61–1.24)
GFR, EST AFRICAN AMERICAN: 52 mL/min — AB (ref >60)
GFR, EST NON AFRICAN AMERICAN: 45 mL/min — AB (ref >60)
Glucose, Bld: 140 mg/dL — ABNORMAL HIGH (ref 65–99)
POTASSIUM: 4.1 mmol/L (ref 3.5–5.1)
SODIUM: 143 mmol/L (ref 135–145)

## 2016-08-10 LAB — URINALYSIS, ROUTINE W REFLEX MICROSCOPIC
BACTERIA UA: NONE SEEN
Bilirubin Urine: NEGATIVE
Glucose, UA: NEGATIVE mg/dL
KETONES UR: NEGATIVE mg/dL
NITRITE: NEGATIVE
PH: 5 (ref 5.0–8.0)
Protein, ur: NEGATIVE mg/dL
Specific Gravity, Urine: 1.016 (ref 1.005–1.030)
Squamous Epithelial / LPF: NONE SEEN

## 2016-08-10 LAB — CBC
HEMATOCRIT: 41 % (ref 39.0–52.0)
HEMOGLOBIN: 13.5 g/dL (ref 13.0–17.0)
MCH: 31 pg (ref 26.0–34.0)
MCHC: 32.9 g/dL (ref 30.0–36.0)
MCV: 94 fL (ref 78.0–100.0)
Platelets: 196 10*3/uL (ref 150–400)
RBC: 4.36 MIL/uL (ref 4.22–5.81)
RDW: 14 % (ref 11.5–15.5)
WBC: 6.2 10*3/uL (ref 4.0–10.5)

## 2016-08-10 LAB — I-STAT TROPONIN, ED: Troponin i, poc: 0.05 ng/mL (ref 0.00–0.08)

## 2016-08-10 LAB — CBG MONITORING, ED: GLUCOSE-CAPILLARY: 99 mg/dL (ref 65–99)

## 2016-08-10 MED ORDER — SODIUM CHLORIDE 0.9 % IV SOLN
1000.0000 mg | Freq: Once | INTRAVENOUS | Status: AC
  Administered 2016-08-10: 1000 mg via INTRAVENOUS
  Filled 2016-08-10: qty 10

## 2016-08-10 MED ORDER — CEPHALEXIN 500 MG PO CAPS: 500.0000 mg | ORAL_CAPSULE | Freq: Four times a day (QID) | ORAL | 0 refills | Status: AC

## 2016-08-10 MED ORDER — LEVETIRACETAM 500 MG PO TABS: 500.0000 mg | ORAL_TABLET | Freq: Two times a day (BID) | ORAL | 0 refills | Status: AC

## 2016-08-10 MED ORDER — DEXTROSE 5 % IV SOLN
1.0000 g | Freq: Once | INTRAVENOUS | Status: AC
  Administered 2016-08-10: 1 g via INTRAVENOUS
  Filled 2016-08-10 (×2): qty 10

## 2016-08-10 NOTE — ED Notes (Signed)
Pt departed in NAD, and in custody of [employer].

## 2016-08-10 NOTE — ED Triage Notes (Signed)
Pt brought here by [facility] after being tx overnight at [facility] for a seizure.  States no hx of seizures.  Was sent here by the Center b/c they "were not happy with the care at [facility]".

## 2016-08-10 NOTE — ED Notes (Signed)
Dr. Yao at bedside. 

## 2016-08-10 NOTE — Discharge Instructions (Signed)
Continue keppra 500 mg twice daily. He doesn't need another EEG unless he has another seizure.   He has a urinary tract infection. He was given rocephin in the ED.   Please take keflex 500 mg four times daily for a week for UTI.   See your doctor. Recommend outpatient neurology referral   Return to ER if he has another seizure, vomiting, fever, severe abdominal pain, flank pain.

## 2016-08-10 NOTE — ED Provider Notes (Signed)
[facility] DEPT Provider Note   CSN: [id number] Arrival date & time: 08/10/16  1408     History   Chief Complaint Chief Complaint  Patient presents with  . Seizures    HPI [name] [name] is a 82 y.o. male history of diabetes, renal insufficiency [facility] jail for [facility] opinion. About 2 days ago, patient had a seizure-like activity and was noted to have possible tonic-clonic jerking movement on the right side while he was sleeping. He was transferred to [facility] at that time. He had an extensive workup including normal CT head and MRI brain and lab work showed Cr 1.4, baseline. Patient was admitted for observation and had EEG done but results pending. Patient was started on keppra 500 mg BID empirically. Patient went back to the facility this morning and states that he is feeling fine. The facility doctor reviewed the records and was concerned and sent him for a second opinion. Denies any further seizure-like episode. Denies any nausea vomiting or fevers. He states that he didn't get any keppra today.   The history is provided by the patient.    Past Medical History:  Diagnosis Date  . Arthritis   . Diabetes mellitus without complication ([place])    type 2  . Prostate disease   . Renal disorder    kidney stones    Patient Active Problem List   Diagnosis Date Noted  . Left knee pain 09/06/2015  . Hyperlipidemia associated with type 2 diabetes mellitus ([place]) 09/06/2015  . Tremor 09/06/2015  . Urinary frequency 09/06/2015  . Acute urinary retention 08/12/2015  . Hyperkalemia 08/10/2015  . Acute renal failure ([place]) 08/10/2015  . Acute bilateral obstructive uropathy 08/10/2015  . HTN (hypertension) 08/10/2015  . Diabetes mellitus, type 2 ([place]) 08/10/2015    Past Surgical History:  Procedure Laterality Date  . CYSTOSCOPY W/ URETERAL STENT PLACEMENT Bilateral 08/10/2015   Procedure: CYSTOSCOPY WITH BILATERAL RETROGRADE PYELOGRAM/BILATERAL URETERAL STENT PLACEMENT;   Surgeon: Kathie Rhodes, MD;  Location: [facility];  Service: Urology;  Laterality: Bilateral;  . CYSTOSCOPY WITH RETROGRADE PYELOGRAM, URETEROSCOPY AND STENT PLACEMENT Bilateral 09/02/2015   Procedure: CYSTOSCOPY BILATERAL STENT REMOVAL BILATERAL RETROGRADE PYELOGRAM, BILATERAL URETEROSCOPY AND BILATERAL STENT PLACEMENT;  Surgeon: Kathie Rhodes, MD;  Location: [facility];  Service: Urology;  Laterality: Bilateral;  . HOLMIUM LASER APPLICATION Bilateral 1/50/5697   Procedure: BILATERAL HOLMIUM LASER  LITHO ;  Surgeon: Kathie Rhodes, MD;  Location: [facility];  Service: Urology;  Laterality: Bilateral;  . JOINT REPLACEMENT Right 2007   hip, Dr. Alvan Dame  . TONSILLECTOMY Bilateral    70 years ago       Home Medications    Prior to Admission medications   Medication Sig Start Date End Date Taking? Authorizing Provider  lisinopril-hydrochlorothiazide (PRINZIDE,ZESTORETIC) 20-12.5 MG tablet Take 1 tablet by mouth daily.   Yes [provider]  metFORMIN (GLUCOPHAGE) 1000 MG tablet Take 1,000 mg by mouth 2 (two) times daily with a meal.   Yes [provider]  naproxen sodium (ANAPROX) 220 MG tablet Take 220 mg by mouth 2 (two) times daily with a meal. Take 2 tablets daily.   Yes [provider]  simvastatin (ZOCOR) 20 MG tablet Take 20 mg by mouth at bedtime.    Yes [provider]  sitaGLIPtin (JANUVIA) 50 MG tablet Take 1 tablet (50 mg total) by mouth daily. 09/30/15  Yes Gildardo Cranker, DO  tamsulosin (FLOMAX) 0.4 MG CAPS capsule Take 1 capsule (0.4 mg total) by mouth daily  after supper. Patient taking differently: Take 0.4 mg by mouth 2 (two) times daily.  09/02/15  Yes Kathie Rhodes, MD  HYDROcodone-acetaminophen (NORCO) 10-325 MG tablet Take 1-2 tablets by mouth every 4 (four) hours as needed for moderate pain. Maximum dose per 24 hours - 8 pills Patient not taking: Reported on 08/10/2016 09/02/15   Kathie Rhodes, MD  levETIRAcetam (KEPPRA) 500 MG tablet Take 500 mg by mouth 2  (two) times daily.    [provider]  phenazopyridine (PYRIDIUM) 200 MG tablet Take 1 tablet (200 mg total) by mouth 3 (three) times daily as needed for pain. Patient not taking: Reported on 08/10/2016 09/02/15   Kathie Rhodes, MD  Tdap Durwin Reges) 5-2.5-18.5 LF-MCG/0.5 injection Inject 0.5 mLs into the muscle once. Patient not taking: Reported on 08/10/2016 09/06/15   Gildardo Cranker, DO  Zoster Vaccine Live, PF, (ZOSTAVAX) 50539 UNT/0.65ML injection Inject 19,400 Units into the skin once. Patient not taking: Reported on 08/10/2016 09/06/15   Gildardo Cranker, DO    Family History Family History  Problem Relation Age of Onset  . Early death Neg Hx   . Kidney disease Neg Hx     Social History Social History  Substance Use Topics  . Smoking status: Never Smoker  . Smokeless tobacco: Never Used  . Alcohol use No     Allergies   Patient has no known allergies.   Review of Systems Review of Systems  Neurological: Positive for seizures.  All other systems reviewed and are negative.    Physical Exam Updated Vital Signs BP (!) 155/68   Pulse 71   Temp 98 F (36.7 C) (Oral)   Resp 17   Ht 5\' 10"  (1.778 m)   Wt 99.8 kg (220 lb)   SpO2 98%   BMI 31.57 kg/m   Physical Exam  Constitutional: He is oriented to person, place, and time.  Chronically ill, NAD   HENT:  Head: Normocephalic.  Mouth/Throat: Oropharynx is clear and moist.  Eyes: EOM are normal. Pupils are equal, round, and reactive to light.  Neck: Normal range of motion. Neck supple.  Cardiovascular: Normal rate, regular rhythm and normal heart sounds.   Pulmonary/Chest: Effort normal and breath sounds normal. No respiratory distress. He has no wheezes.  Abdominal: Soft. Bowel sounds are normal.  Musculoskeletal: Normal range of motion.  Neurological: He is alert and oriented to person, place, and time. No cranial nerve deficit. Coordination normal.  Skin: Skin is warm.  Psychiatric: He has a normal mood and  affect.  Nursing note and vitals reviewed.    ED Treatments / Results  Labs (all labs ordered are listed, but only abnormal results are displayed) Labs Reviewed  BASIC METABOLIC PANEL - Abnormal; Notable for the following:       Result Value   Glucose, Bld 140 (*)    Creatinine, Ser 1.43 (*)    GFR calc non Af Amer 45 (*)    GFR calc Af Amer 52 (*)    All other components within normal limits  URINALYSIS, ROUTINE W REFLEX MICROSCOPIC - Abnormal; Notable for the following:    Hgb urine dipstick SMALL (*)    Leukocytes, UA LARGE (*)    All other components within normal limits  URINE CULTURE  CBC  CBG MONITORING, ED  Randolm Idol, ED    EKG  EKG Interpretation  Date/Time:  Monday Aug 10 2016 17:39:24 EDT Ventricular Rate:  79 PR Interval:    QRS Duration: 165 QT Interval:  437  QTC Calculation: 501 R Axis:   -119 Text Interpretation:  Sinus rhythm Nonspecific IVCD with LAD Baseline wander in lead(s) V2 V3 V4 No significant change since last tracing Confirmed by Shalene Gallen  MD, Anyelin Mogle ([id number]) on 08/10/2016 6:37:49 PM       Radiology No results found.  Procedures Procedures (including critical care time)  Medications Ordered in ED Medications  cefTRIAXone (ROCEPHIN) 1 g in dextrose 5 % 50 mL IVPB (1 g Intravenous New Bag/Given 08/10/16 1930)  levETIRAcetam (KEPPRA) 1,000 mg in sodium chloride 0.9 % 100 mL IVPB (0 mg Intravenous Stopped 08/10/16 1807)     Initial Impression / Assessment and Plan / ED Course  I have reviewed the triage vital signs and the nursing notes.  Pertinent labs & imaging results that were available during my care of the patient were reviewed by me and considered in my medical decision making (see chart for details).     [name] [name] is a 82 y.o. male here with second opinion. I reviewed records from [place]. MRI brain and labs and CT head were unremarkable. He had EEG done with no official results but had no recurrent seizure like episode for  24 hrs. I will repeat labs, load with keppra. I don't think he needs repeat EEG today.   7:47 PM Labs showed Cr 1.4, baseline. UA + large leuk and too many to count WBC. Given rocephin for possible UTI, no abdominal or flank tenderness. Will have him continue keppra 500 mg BID until he sees neurology and keflex 500 mg TID x a week.   Final Clinical Impressions(s) / ED Diagnoses   Final diagnoses:  None    New Prescriptions New Prescriptions   No medications on file     Drenda Freeze, MD 08/10/16 [phone]

## 2016-08-12 LAB — URINE CULTURE: Culture: NO GROWTH

## 2016-12-21 ENCOUNTER — Encounter (HOSPITAL_COMMUNITY): Payer: Self-pay | Admitting: Emergency Medicine

## 2016-12-21 ENCOUNTER — Telehealth: Payer: Self-pay

## 2016-12-21 ENCOUNTER — Emergency Department (HOSPITAL_COMMUNITY): Payer: Medicare Other

## 2016-12-21 ENCOUNTER — Observation Stay (HOSPITAL_COMMUNITY): Payer: Medicare Other

## 2016-12-21 ENCOUNTER — Ambulatory Visit: Payer: Self-pay | Admitting: Nurse Practitioner

## 2016-12-21 ENCOUNTER — Inpatient Hospital Stay (HOSPITAL_COMMUNITY)
Admission: EM | Admit: 2016-12-21 | Discharge: 2016-12-26 | DRG: 690 | Disposition: A | Discharge status: Home / Self Care | Payer: Medicare Other | Attending: Internal Medicine | Admitting: Internal Medicine

## 2016-12-21 DIAGNOSIS — N3941 Urge incontinence: Secondary | ICD-10-CM | POA: Diagnosis present

## 2016-12-21 DIAGNOSIS — I10 Essential (primary) hypertension: Secondary | ICD-10-CM | POA: Diagnosis present

## 2016-12-21 DIAGNOSIS — M1990 Unspecified osteoarthritis, unspecified site: Secondary | ICD-10-CM | POA: Diagnosis present

## 2016-12-21 DIAGNOSIS — E785 Hyperlipidemia, unspecified: Secondary | ICD-10-CM

## 2016-12-21 DIAGNOSIS — M4802 Spinal stenosis, cervical region: Secondary | ICD-10-CM

## 2016-12-21 DIAGNOSIS — E1169 Type 2 diabetes mellitus with other specified complication: Secondary | ICD-10-CM | POA: Diagnosis not present

## 2016-12-21 DIAGNOSIS — Z9889 Other specified postprocedural states: Secondary | ICD-10-CM

## 2016-12-21 DIAGNOSIS — Z6841 Body Mass Index (BMI) 40.0 and over, adult: Secondary | ICD-10-CM | POA: Insufficient documentation

## 2016-12-21 DIAGNOSIS — I129 Hypertensive chronic kidney disease with stage 1 through stage 4 chronic kidney disease, or unspecified chronic kidney disease: Secondary | ICD-10-CM | POA: Diagnosis present

## 2016-12-21 DIAGNOSIS — G40909 Epilepsy, unspecified, not intractable, without status epilepticus: Secondary | ICD-10-CM | POA: Diagnosis present

## 2016-12-21 DIAGNOSIS — N179 Acute kidney failure, unspecified: Secondary | ICD-10-CM

## 2016-12-21 DIAGNOSIS — N183 Chronic kidney disease, stage 3 (moderate): Secondary | ICD-10-CM | POA: Diagnosis present

## 2016-12-21 DIAGNOSIS — I639 Cerebral infarction, unspecified: Secondary | ICD-10-CM

## 2016-12-21 DIAGNOSIS — Z23 Encounter for immunization: Secondary | ICD-10-CM

## 2016-12-21 DIAGNOSIS — Z993 Dependence on wheelchair: Secondary | ICD-10-CM

## 2016-12-21 DIAGNOSIS — B9620 Unspecified Escherichia coli [E. coli] as the cause of diseases classified elsewhere: Secondary | ICD-10-CM | POA: Diagnosis present

## 2016-12-21 DIAGNOSIS — Z1624 Resistance to multiple antibiotics: Secondary | ICD-10-CM | POA: Diagnosis present

## 2016-12-21 DIAGNOSIS — M62421 Contracture of muscle, right upper arm: Secondary | ICD-10-CM | POA: Diagnosis present

## 2016-12-21 DIAGNOSIS — R569 Unspecified convulsions: Secondary | ICD-10-CM | POA: Insufficient documentation

## 2016-12-21 DIAGNOSIS — I493 Ventricular premature depolarization: Secondary | ICD-10-CM | POA: Diagnosis present

## 2016-12-21 DIAGNOSIS — Z8673 Personal history of transient ischemic attack (TIA), and cerebral infarction without residual deficits: Secondary | ICD-10-CM

## 2016-12-21 DIAGNOSIS — Z96641 Presence of right artificial hip joint: Secondary | ICD-10-CM | POA: Diagnosis present

## 2016-12-21 DIAGNOSIS — R5381 Other malaise: Secondary | ICD-10-CM

## 2016-12-21 DIAGNOSIS — E1122 Type 2 diabetes mellitus with diabetic chronic kidney disease: Secondary | ICD-10-CM | POA: Diagnosis present

## 2016-12-21 DIAGNOSIS — N390 Urinary tract infection, site not specified: Secondary | ICD-10-CM | POA: Diagnosis not present

## 2016-12-21 DIAGNOSIS — Z87442 Personal history of urinary calculi: Secondary | ICD-10-CM

## 2016-12-21 DIAGNOSIS — Z7984 Long term (current) use of oral hypoglycemic drugs: Secondary | ICD-10-CM

## 2016-12-21 DIAGNOSIS — Z79899 Other long term (current) drug therapy: Secondary | ICD-10-CM

## 2016-12-21 DIAGNOSIS — I6522 Occlusion and stenosis of left carotid artery: Secondary | ICD-10-CM | POA: Diagnosis present

## 2016-12-21 DIAGNOSIS — E119 Type 2 diabetes mellitus without complications: Secondary | ICD-10-CM

## 2016-12-21 DIAGNOSIS — I959 Hypotension, unspecified: Secondary | ICD-10-CM | POA: Diagnosis present

## 2016-12-21 DIAGNOSIS — R531 Weakness: Secondary | ICD-10-CM

## 2016-12-21 HISTORY — DX: Unspecified convulsions: R56.9

## 2016-12-21 LAB — URINALYSIS, ROUTINE W REFLEX MICROSCOPIC
BILIRUBIN URINE: NEGATIVE
GLUCOSE, UA: 150 mg/dL — AB
KETONES UR: NEGATIVE mg/dL
NITRITE: NEGATIVE
PH: 5 (ref 5.0–8.0)
Protein, ur: 100 mg/dL — AB
SPECIFIC GRAVITY, URINE: 1.015 (ref 1.005–1.030)
Squamous Epithelial / LPF: NONE SEEN

## 2016-12-21 LAB — COMPREHENSIVE METABOLIC PANEL
ALBUMIN: 3.1 g/dL — AB (ref 3.5–5.0)
ALK PHOS: 85 U/L (ref 38–126)
ALT: 20 U/L (ref 17–63)
AST: 33 U/L (ref 15–41)
Anion gap: 11 (ref 5–15)
BILIRUBIN TOTAL: 1 mg/dL (ref 0.3–1.2)
BUN: 40 mg/dL — ABNORMAL HIGH (ref 6–20)
CALCIUM: 9 mg/dL (ref 8.9–10.3)
CO2: 25 mmol/L (ref 22–32)
Chloride: 102 mmol/L (ref 101–111)
Creatinine, Ser: 1.65 mg/dL — ABNORMAL HIGH (ref 0.61–1.24)
GFR calc Af Amer: 44 mL/min — ABNORMAL LOW (ref >60)
GFR calc non Af Amer: 38 mL/min — ABNORMAL LOW (ref >60)
GLUCOSE: 152 mg/dL — AB (ref 65–99)
Potassium: 4.3 mmol/L (ref 3.5–5.1)
SODIUM: 138 mmol/L (ref 135–145)
Total Protein: 6.1 g/dL — ABNORMAL LOW (ref 6.5–8.1)

## 2016-12-21 LAB — CBC
HCT: 34.8 % — ABNORMAL LOW (ref 39.0–52.0)
Hemoglobin: 11.6 g/dL — ABNORMAL LOW (ref 13.0–17.0)
MCH: 31.4 pg (ref 26.0–34.0)
MCHC: 33.3 g/dL (ref 30.0–36.0)
MCV: 94.1 fL (ref 78.0–100.0)
PLATELETS: 156 10*3/uL (ref 150–400)
RBC: 3.7 MIL/uL — AB (ref 4.22–5.81)
RDW: 15.1 % (ref 11.5–15.5)
WBC: 8.2 10*3/uL (ref 4.0–10.5)

## 2016-12-21 LAB — GLUCOSE, CAPILLARY: Glucose-Capillary: 128 mg/dL — ABNORMAL HIGH (ref 65–99)

## 2016-12-21 LAB — BRAIN NATRIURETIC PEPTIDE: B Natriuretic Peptide: 64.1 pg/mL (ref 0.0–100.0)

## 2016-12-21 LAB — CBG MONITORING, ED: Glucose-Capillary: 138 mg/dL — ABNORMAL HIGH (ref 65–99)

## 2016-12-21 LAB — HEMOGLOBIN A1C
Hgb A1c MFr Bld: 5.8 % — ABNORMAL HIGH (ref 4.8–5.6)
Mean Plasma Glucose: 119.76 mg/dL

## 2016-12-21 MED ORDER — STROKE: EARLY STAGES OF RECOVERY BOOK
Freq: Once | Status: AC
  Administered 2016-12-21: 22:00:00
  Filled 2016-12-21: qty 1

## 2016-12-21 MED ORDER — LEVETIRACETAM 500 MG PO TABS
500.0000 mg | ORAL_TABLET | Freq: Two times a day (BID) | ORAL | Status: DC
  Administered 2016-12-21 – 2016-12-26 (×10): 500 mg via ORAL
  Filled 2016-12-21 (×10): qty 1

## 2016-12-21 MED ORDER — DEXTROSE 5 % IV SOLN
1.0000 g | Freq: Once | INTRAVENOUS | Status: AC
  Administered 2016-12-21: 1 g via INTRAVENOUS
  Filled 2016-12-21: qty 10

## 2016-12-21 MED ORDER — ASPIRIN 300 MG RE SUPP
300.0000 mg | Freq: Every day | RECTAL | Status: DC
  Filled 2016-12-21: qty 1

## 2016-12-21 MED ORDER — ACETAMINOPHEN 160 MG/5ML PO SOLN: 650.0000 mg | ORAL | Status: DC | PRN

## 2016-12-21 MED ORDER — DEXTROSE 5 % IV SOLN
1.0000 g | INTRAVENOUS | Status: DC
  Administered 2016-12-22 – 2016-12-23 (×2): 1 g via INTRAVENOUS
  Filled 2016-12-21 (×2): qty 10

## 2016-12-21 MED ORDER — INSULIN ASPART 100 UNIT/ML ~~LOC~~ SOLN
0.0000 [IU] | Freq: Three times a day (TID) | SUBCUTANEOUS | Status: DC
  Administered 2016-12-22: 1 [IU] via SUBCUTANEOUS
  Administered 2016-12-22 – 2016-12-24 (×2): 2 [IU] via SUBCUTANEOUS
  Administered 2016-12-26 (×2): 1 [IU] via SUBCUTANEOUS

## 2016-12-21 MED ORDER — ACETAMINOPHEN 325 MG PO TABS
650.0000 mg | ORAL_TABLET | ORAL | Status: DC | PRN
  Administered 2016-12-23 (×3): 650 mg via ORAL
  Filled 2016-12-21 (×4): qty 2

## 2016-12-21 MED ORDER — NYSTATIN 100000 UNIT/GM EX POWD
Freq: Once | CUTANEOUS | Status: AC
  Administered 2016-12-21: 19:00:00 via TOPICAL
  Filled 2016-12-21: qty 15

## 2016-12-21 MED ORDER — ASPIRIN 325 MG PO TABS
325.0000 mg | ORAL_TABLET | Freq: Every day | ORAL | Status: DC
  Administered 2016-12-21 – 2016-12-26 (×6): 325 mg via ORAL
  Filled 2016-12-21 (×6): qty 1

## 2016-12-21 MED ORDER — ACETAMINOPHEN 650 MG RE SUPP: 650.0000 mg | RECTAL | Status: DC | PRN

## 2016-12-21 NOTE — ED Notes (Signed)
Spoke with [name] [name] in MRI for ETA for MRI. Per [name] pt will not be brought back until at least 2 hours. Inpatient flor will be made aware upon report.

## 2016-12-21 NOTE — Telephone Encounter (Signed)
Patient arrived with Son-in-law. Son-in-law requested assistance to get patient out of car. Patient's son-in-law states it took 1 hour to get patient in the car and he is dead weight, unable to move.   Per Sherrie Mustache, NP if patient is unable to get out of the car he needs to be seen in the ER for we are unable to assist patients with no mobility to avoid bodily injury to the patient.  Patient's son-in-law agreed and stated he will take patient to ER.

## 2016-12-21 NOTE — ED Notes (Signed)
External condom cath applied to pt.

## 2016-12-21 NOTE — H&P (Signed)
History and Physical    FREMAN LAPAGE STM:[id number] DOB: September 19, 1934 DOA: 12/21/2016  PCP: Gildardo Cranker, DO  Patient coming from: home  Chief Complaint:   weakness  HPI: [name] [name] is a 82 y.o. male with medical history significant of DM, seizure disorder who just released from jail Friday (today is Monday) after being there for a year comes in for weakness.  Pt reports he has been wheelchair bound for months there and was told he had a stroke.  He has gotten minimal therapy.  Was not hospitalized per his report and officially diagnosed with a cva.  Denies fevers.  He is chronically incontinent of urine.  He wears a depends.  Pt is staying with a friend of his who is blind.  He comes in due to his weakness for months and ct showing likely new infarct.  Pt reports he has had more difficulty with using his right hand, arm and his right fingers are contracted.  Pt referred for admission for possible stroke.  Review of Systems: As per HPI otherwise 10 point review of systems negative.   Past Medical History:  Diagnosis Date  . Arthritis   . Diabetes mellitus without complication ([place])    type 2  . Prostate disease   . Renal disorder    kidney stones  . Seizures ([place])     Past Surgical History:  Procedure Laterality Date  . CYSTOSCOPY W/ URETERAL STENT PLACEMENT Bilateral 08/10/2015   Procedure: CYSTOSCOPY WITH BILATERAL RETROGRADE PYELOGRAM/BILATERAL URETERAL STENT PLACEMENT;  Surgeon: Kathie Rhodes, MD;  Location: [facility];  Service: Urology;  Laterality: Bilateral;  . CYSTOSCOPY WITH RETROGRADE PYELOGRAM, URETEROSCOPY AND STENT PLACEMENT Bilateral 09/02/2015   Procedure: CYSTOSCOPY BILATERAL STENT REMOVAL BILATERAL RETROGRADE PYELOGRAM, BILATERAL URETEROSCOPY AND BILATERAL STENT PLACEMENT;  Surgeon: Kathie Rhodes, MD;  [facility]: [facility];  Service: Urology;  Laterality: Bilateral;  . HOLMIUM LASER APPLICATION Bilateral 8/92/1194   Procedure: BILATERAL HOLMIUM LASER  LITHO ;  Surgeon: Kathie Rhodes, MD;  Location: [facility];  Service: Urology;  Laterality: Bilateral;  . JOINT REPLACEMENT Right 2007   hip, Dr. Alvan Dame  . TONSILLECTOMY Bilateral    70 years ago     reports that he has never smoked. He has never used smokeless tobacco. He reports that he does not drink alcohol or use drugs.  No Known Allergies  Family History  Problem Relation Age of Onset  . Early death Neg Hx   . Kidney disease Neg Hx     Prior to Admission medications   Medication Sig Start Date End Date Taking? Authorizing Provider  levETIRAcetam (KEPPRA) 500 MG tablet Take 1 tablet (500 mg total) by mouth 2 (two) times daily. 08/10/16  Yes Drenda Freeze, MD  lisinopril-hydrochlorothiazide (PRINZIDE,ZESTORETIC) 20-12.5 MG tablet Take 1 tablet by mouth daily.   Yes [provider]  loperamide (IMODIUM A-D) 2 MG tablet Take 2-4 mg by mouth 4 (four) times daily as needed for diarrhea or loose stools.   Yes [provider]  simvastatin (ZOCOR) 20 MG tablet Take 20 mg by mouth daily.    Yes [provider]  tamsulosin (FLOMAX) 0.4 MG CAPS capsule Take 1 capsule (0.4 mg total) by mouth daily after supper. Patient taking differently: Take 0.4 mg by mouth daily.  09/02/15  Yes Kathie Rhodes, MD  HYDROcodone-acetaminophen (NORCO) 10-325 MG tablet Take 1-2 tablets by mouth every 4 (four) hours as needed for moderate pain. Maximum dose per 24 hours - 8 pills Patient not  taking: Reported on 12/21/2016 09/02/15   Kathie Rhodes, MD  metFORMIN (GLUCOPHAGE) 1000 MG tablet Take 1,000 mg by mouth 2 (two) times daily with a meal.    [provider]  phenazopyridine (PYRIDIUM) 200 MG tablet Take 1 tablet (200 mg total) by mouth 3 (three) times daily as needed for pain. 09/02/15   Kathie Rhodes, MD  sitaGLIPtin (JANUVIA) 50 MG tablet Take 1 tablet (50 mg total) by mouth daily. 09/30/15   Gildardo Cranker, DO  Tdap ([place]) 5-2.5-18.5 LF-MCG/0.5 injection Inject 0.5 mLs into the muscle  once. Patient not taking: Reported on 12/21/2016 09/06/15   Gildardo Cranker, DO  Zoster Vaccine Live, PF, (ZOSTAVAX) 37902 UNT/0.65ML injection Inject 19,400 Units into the skin once. Patient not taking: Reported on 12/21/2016 09/06/15   Gildardo Cranker, DO    Physical Exam: Vitals:   12/21/16 1945 12/21/16 2028 12/21/16 2030 12/21/16 2031  BP: 105/67 116/63 118/68   Pulse: 60 73 74   Resp: 15 18 19    Temp:      TempSrc:      SpO2: 94% 94% 93% 95%      Constitutional: NAD, calm, comfortable Vitals:   12/21/16 1945 12/21/16 2028 12/21/16 2030 12/21/16 2031  BP: 105/67 116/63 118/68   Pulse: 60 73 74   Resp: 15 18 19    Temp:      TempSrc:      SpO2: 94% 94% 93% 95%   Eyes: PERRL, lids and conjunctivae normal ENMT: Mucous membranes are moist. Posterior pharynx clear of any exudate or lesions.Normal dentition.  Neck: normal, supple, no masses, no thyromegaly Respiratory: clear to auscultation bilaterally, no wheezing, no crackles. Normal respiratory effort. No accessory muscle use.  Cardiovascular: Regular rate and rhythm, no murmurs / rubs / gallops.  1 + extremity edema. 2+ pedal pulses. No carotid bruits.  Abdomen: no tenderness, no masses palpated. No hepatosplenomegaly. Bowel sounds positive.  Musculoskeletal: no clubbing / cyanosis. No joint deformity upper and lower extremities. Good ROM, no contractures. Normal muscle tone.  Skin: no rashes, lesions, ulcers. No induration Neurologic: CN 2-12 grossly intact. Sensation intact, DTR normal. Strength 3/5 in all 4.   Right hand contracted Psychiatric: Normal judgment and insight. Alert and oriented x 3. Normal mood.    Labs on Admission: I have personally reviewed following labs and imaging studies  CBC:  Recent Labs Lab 12/21/16 1730  WBC 8.2  HGB 11.6*  HCT 34.8*  MCV 94.1  PLT 409   Basic Metabolic Panel:  Recent Labs Lab 12/21/16 1730  NA 138  K 4.3  CL 102  CO2 25  GLUCOSE 152*  BUN 40*  CREATININE 1.65*   CALCIUM 9.0   GFR: CrCl cannot be calculated (Unknown ideal weight.). Liver Function Tests:  Recent Labs Lab 12/21/16 1730  AST 33  ALT 20  ALKPHOS 85  BILITOT 1.0  PROT 6.1*  ALBUMIN 3.1*   CBG:  Recent Labs Lab 12/21/16 1355  GLUCAP 138*   Urine analysis:    Component Value Date/Time   COLORURINE AMBER (A) 12/21/2016 1735   APPEARANCEUR TURBID (A) 12/21/2016 1735   LABSPEC 1.015 12/21/2016 1735   PHURINE 5.0 12/21/2016 1735   GLUCOSEU 150 (A) 12/21/2016 1735   HGBUR MODERATE (A) 12/21/2016 1735   BILIRUBINUR NEGATIVE 12/21/2016 1735   KETONESUR NEGATIVE 12/21/2016 1735   PROTEINUR 100 (A) 12/21/2016 1735   UROBILINOGEN 0.2 03/14/2009 1129   NITRITE NEGATIVE 12/21/2016 1735   LEUKOCYTESUR LARGE (A) 12/21/2016 1735    Radiological  Exams on Admission: Ct Head Wo Contrast  Result Date: 12/21/2016 CLINICAL DATA:  Sudden extremity weakness, and type II diabetes mellitus EXAM: CT HEAD WITHOUT CONTRAST TECHNIQUE: Contiguous axial images were obtained from the base of the skull through the vertex without intravenous contrast. Sagittal and coronal MPR images reconstructed from axial data set. COMPARISON:  None. FINDINGS: Brain: Normal ventricular morphology. No midline shift or mass effect. Small old lacunar infarct at LEFT basal ganglia. Small vessel chronic ischemic changes of deep cerebral white matter. No intracranial hemorrhage, mass lesion, evidence of acute infarction, or extra-axial fluid collection. Vascular: Atherosclerotic calcification of internal carotid arteries bilaterally at skullbase Skull: Demineralized but intact Sinuses/Orbits: Small mucosal retention cyst versus polyp at RIGHT sphenoid sinus. Remaining paranasal sinuses and mastoid air cells clear. Other: N/A IMPRESSION: Atrophy with small vessel chronic ischemic changes of deep cerebral white matter. Small old lacunar infarct at LEFT basal ganglia. No acute intracranial abnormalities. Electronically Signed    By: Lavonia Dana M.D.   On: 12/21/2016 19:13   Dg Chest Portable 1 View  Result Date: 12/21/2016 CLINICAL DATA:  Generalized weakness.  Diarrhea. EXAM: PORTABLE CHEST 1 VIEW COMPARISON:  08/08/2016 FINDINGS: Hazy densities in left hilum and the lateral left chest appear to be chronic and could be related to scarring. Again noted are low lung volumes. No new airspace disease or pulmonary edema. Heart size is normal. Atherosclerotic calcifications at the aortic arch. Trachea is midline. IMPRESSION: No active disease. Electronically Signed   By: Markus Daft M.D.   On: 12/21/2016 18:30    EKG: Independently reviewed. rbbb Old chart reviewed Case discussed with edp  Assessment/Plan 82 yo male with likely stroke months ago  Principal Problem:   Weakness and overall deconditioning with right upper extremity contracture - likely from old stroke.  Needs PT eval.  He want possible inpt rehab vs help at home if possible with HH/PT at home.  Obtain PT eval.  Obtain mri brain, carotid dopplers and cardiac echo.  freq neuro cks.  Aspirin.  Ck hga1c and lipid panel in am.  Obtain neurology evaluation.  Active Problems:   Contracture of muscle of right upper arm- as above   HTN (hypertension)- stable   Diabetes mellitus, type 2 ([place])- hold oral meds, ssi   Hyperlipidemia associated with type 2 diabetes mellitus ([place])- flp in am   UTI (urinary tract infection)-  Place on rocephin and follow up on urine cx    DVT prophylaxis:  scds Code Status:  full Family Communication:  none Disposition Plan:  Per day team Consults called:  Neuro, PT Admission status:  Observation  At his baseline he says he can transfer himself from bed to chair   Jetaun Colbath A MD [facility]  If 7PM-7AM, please contact night-coverage [email or web address] Password [email or web address]  12/21/2016, 8:41 PM

## 2016-12-21 NOTE — ED Provider Notes (Signed)
[facility] DEPT Provider Note   CSN: [id number] Arrival date & time: 12/21/16  1307  History   Chief Complaint Chief Complaint  Patient presents with  . Weakness   HPI KAPONO LUHN is a 82 y.o. male.  The patient is a 82yo male with a past medical history significant for T2DM, HLD, seizures, kidney stones, renal insufficiency, nephrolithiasis, urinary incontinence, who presents to the ED complaining of generalized weakness.  The patient was recently discharged from jail, where he lived for 1 year. He reports progressive worsening of symptoms for the past several months. He was evaluated in May of this year after a reported seizure like episode; records review reveals a negative head CT and MRI at that time. The patient reportedly followed up with a neurologist in [place], however no records are available from a meeting. The patient went to a doctor's appointment today but was unable to get out of the car due to generalized weakness; he was subsequently sent to the ED.  Additional symptoms include worsening urinary urgency and incontinence for the past year.  He also reports diarrhea x2-3/day for several months.  Lastly, he is complaining of worsening weakness to his right upper extremity, to the point that he is now unable to use the right upper extremity to eat (he is right-hand dominant).   The history is provided by the patient and medical records. No language interpreter was used.  Weakness  Pertinent negatives include no shortness of breath, no chest pain and no vomiting.   Past Medical History:  Diagnosis Date  . Arthritis   . Diabetes mellitus without complication ([place])    type 2  . Prostate disease   . Renal disorder    kidney stones  . Seizures [facility])    Patient Active Problem List   Diagnosis Date Noted  . .0... 12/21/2016  . Weakness 12/21/2016  . Contracture of muscle of right upper arm 12/21/2016  . UTI (urinary tract infection) 12/21/2016  . Seizures ([place])   .  Left knee pain 09/06/2015  . Hyperlipidemia associated with type 2 diabetes mellitus ([place]) 09/06/2015  . Tremor 09/06/2015  . Urinary frequency 09/06/2015  . Acute urinary retention 08/12/2015  . Hyperkalemia 08/10/2015  . Acute renal failure ([place]) 08/10/2015  . Acute bilateral obstructive uropathy 08/10/2015  . HTN (hypertension) 08/10/2015  . Diabetes mellitus, type 2 ([place]) 08/10/2015   Past Surgical History:  Procedure Laterality Date  . CYSTOSCOPY W/ URETERAL STENT PLACEMENT Bilateral 08/10/2015   Procedure: CYSTOSCOPY WITH BILATERAL RETROGRADE PYELOGRAM/BILATERAL URETERAL STENT PLACEMENT;  Surgeon: Kathie Rhodes, MD;  Location[facility];  Service: Urology;  Laterality: Bilateral;  . CYSTOSCOPY WITH RETROGRADE PYELOGRAM, URETEROSCOPY AND STENT PLACEMENT Bilateral 09/02/2015   Procedure: CYSTOSCOPY BILATERAL STENT REMOVAL BILATERAL RETROGRADE PYELOGRAM, BILATERAL URETEROSCOPY AND BILATERAL STENT PLACEMENT;  Surgeon: Kathie Rhodes, MD;  [facility];  Service: Urology;  Laterality: Bilateral;  . HOLMIUM LASER APPLICATION Bilateral 04/17/5425   Procedure: BILATERAL HOLMIUM LASER  LITHO ;  Surgeon: Kathie Rhodes, MD;  Location: [facility];  Service: Urology;  Laterality: Bilateral;  . JOINT REPLACEMENT Right 2007   hip, Dr. Alvan Dame  . TONSILLECTOMY Bilateral    70 years ago    Home Medications    Prior to Admission medications   Medication Sig Start Date End Date Taking? Authorizing Provider  levETIRAcetam (KEPPRA) 500 MG tablet Take 1 tablet (500 mg total) by mouth 2 (two) times daily. 08/10/16  Yes Drenda Freeze, MD  lisinopril-hydrochlorothiazide (PRINZIDE,ZESTORETIC) 20-12.5 MG tablet  Take 1 tablet by mouth daily.   Yes [provider]  loperamide (IMODIUM A-D) 2 MG tablet Take 2-4 mg by mouth 4 (four) times daily as needed for diarrhea or loose stools.   Yes [provider]  simvastatin (ZOCOR) 20 MG tablet Take 20 mg by mouth daily.    Yes [provider]    tamsulosin (FLOMAX) 0.4 MG CAPS capsule Take 1 capsule (0.4 mg total) by mouth daily after supper. Patient taking differently: Take 0.4 mg by mouth daily.  09/02/15  Yes Kathie Rhodes, MD  HYDROcodone-acetaminophen (NORCO) 10-325 MG tablet Take 1-2 tablets by mouth every 4 (four) hours as needed for moderate pain. Maximum dose per 24 hours - 8 pills Patient not taking: Reported on 12/21/2016 09/02/15   Kathie Rhodes, MD  metFORMIN (GLUCOPHAGE) 1000 MG tablet Take 1,000 mg by mouth 2 (two) times daily with a meal.    [provider]  phenazopyridine (PYRIDIUM) 200 MG tablet Take 1 tablet (200 mg total) by mouth 3 (three) times daily as needed for pain. 09/02/15   Kathie Rhodes, MD  sitaGLIPtin (JANUVIA) 50 MG tablet Take 1 tablet (50 mg total) by mouth daily. 09/30/15   Gildardo Cranker, DO  Tdap ([place]) 5-2.5-18.5 LF-MCG/0.5 injection Inject 0.5 mLs into the muscle once. Patient not taking: Reported on 12/21/2016 09/06/15   Gildardo Cranker, DO  Zoster Vaccine Live, PF, (ZOSTAVAX) 63016 UNT/0.65ML injection Inject 19,400 Units into the skin once. Patient not taking: Reported on 12/21/2016 09/06/15   Gildardo Cranker, DO    Family History Family History  Problem Relation Age of Onset  . Early death Neg Hx   . Kidney disease Neg Hx     Social History Social History  Substance Use Topics  . Smoking status: Never Smoker  . Smokeless tobacco: Never Used  . Alcohol use No     Allergies   Patient has no known allergies.  Review of Systems Review of Systems  Constitutional: Negative for chills and fever.  HENT: Negative for ear pain and sore throat.   Eyes: Negative for pain and visual disturbance.  Respiratory: Negative for cough and shortness of breath.   Cardiovascular: Positive for leg swelling. Negative for chest pain and palpitations.  Gastrointestinal: Positive for diarrhea. Negative for abdominal pain, constipation, nausea and vomiting.  Genitourinary: Positive for urgency.  Negative for dysuria and hematuria.       Urinary incontinence  Musculoskeletal: Negative for arthralgias, back pain and gait problem.  Skin: Negative for color change and rash.  Allergic/Immunologic: Negative for immunocompromised state.  Neurological: Positive for weakness. Negative for seizures and syncope.  Hematological: Negative.   Psychiatric/Behavioral: Negative.   All other systems reviewed and are negative.  Physical Exam Updated Vital Signs BP (!) 88/41 (BP Location: Right Arm)   Pulse 65   Temp 98.1 F (36.7 C) (Oral)   Resp 18   Ht 5\' 5"  (1.651 m)   Wt 96 kg (211 lb 11.2 oz)   SpO2 93%   BMI 35.23 kg/m   Physical Exam  Constitutional: He is oriented to person, place, and time. He appears well-developed and well-nourished.  Malodorous, tired-appearing elderly male  HENT:  Head: Normocephalic and atraumatic.  Eyes: Conjunctivae are normal.  Neck: Neck supple.  Cardiovascular: Normal rate, regular rhythm and intact distal pulses.   Pulmonary/Chest: Effort normal and breath sounds normal. No respiratory distress. He has no wheezes.  Abdominal: Soft. There is no tenderness. There is no guarding.  Genitourinary:  Genitourinary  Comments: Thick, sedimentous urine  Musculoskeletal: He exhibits edema (3+ pitting bilateral lower extremity edema).  Neurological: He is alert and oriented to person, place, and time. He displays normal reflexes. No sensory deficit. He exhibits abnormal muscle tone.  Tremor to left upper extremity; contracture of right hand, with 4 out of 5 strength in this extremity  Skin: Skin is warm and dry.  Erythematous rash to groin consistent with yeast infection  Psychiatric: He has a normal mood and affect. His behavior is normal. Judgment and thought content normal.  Nursing note and vitals reviewed.   ED Treatments / Results  Labs (all labs ordered are listed, but only abnormal results are displayed) Labs Reviewed  CBC - Abnormal; Notable for  the following:       Result Value   RBC 3.70 (*)    Hemoglobin 11.6 (*)    HCT 34.8 (*)    All other components within normal limits  URINALYSIS, ROUTINE W REFLEX MICROSCOPIC - Abnormal; Notable for the following:    Color, Urine AMBER (*)    APPearance TURBID (*)    Glucose, UA 150 (*)    Hgb urine dipstick MODERATE (*)    Protein, ur 100 (*)    Leukocytes, UA LARGE (*)    Bacteria, UA MANY (*)    All other components within normal limits  COMPREHENSIVE METABOLIC PANEL - Abnormal; Notable for the following:    Glucose, Bld 152 (*)    BUN 40 (*)    Creatinine, Ser 1.65 (*)    Total Protein 6.1 (*)    Albumin 3.1 (*)    GFR calc non Af Amer 38 (*)    GFR calc Af Amer 44 (*)    All other components within normal limits  HEMOGLOBIN A1C - Abnormal; Notable for the following:    Hgb A1c MFr Bld 5.8 (*)    All other components within normal limits  GLUCOSE, CAPILLARY - Abnormal; Notable for the following:    Glucose-Capillary 128 (*)    All other components within normal limits  CBG MONITORING, ED - Abnormal; Notable for the following:    Glucose-Capillary 138 (*)    All other components within normal limits  URINE CULTURE  BRAIN NATRIURETIC PEPTIDE  HEMOGLOBIN A1C  LIPID PANEL   EKG  EKG Interpretation  Date/Time:  Monday December 21 2016 13:47:44 EDT Ventricular Rate:  95 PR Interval:  172 QRS Duration: 144 QT Interval:  390 QTC Calculation: 490 R Axis:   -122 Text Interpretation:  Normal sinus rhythm Right bundle branch block Lateral infarct , age undetermined Abnormal ECG No significant change since last tracing other than lead placement for V1 and V2 Confirmed by Addison Lank [phone]) on 12/21/2016 4:43:18 PM      Radiology Ct Head Wo Contrast  Result Date: 12/21/2016 CLINICAL DATA:  Sudden extremity weakness, and type II diabetes mellitus EXAM: CT HEAD WITHOUT CONTRAST TECHNIQUE: Contiguous axial images were obtained from the base of the skull through the vertex  without intravenous contrast. Sagittal and coronal MPR images reconstructed from axial data set. COMPARISON:  None. FINDINGS: Brain: Normal ventricular morphology. No midline shift or mass effect. Small old lacunar infarct at LEFT basal ganglia. Small vessel chronic ischemic changes of deep cerebral white matter. No intracranial hemorrhage, mass lesion, evidence of acute infarction, or extra-axial fluid collection. Vascular: Atherosclerotic calcification of internal carotid arteries bilaterally at skullbase Skull: Demineralized but intact Sinuses/Orbits: Small mucosal retention cyst versus polyp at RIGHT sphenoid sinus. Remaining  paranasal sinuses and mastoid air cells clear. Other: N/A IMPRESSION: Atrophy with small vessel chronic ischemic changes of deep cerebral white matter. Small old lacunar infarct at LEFT basal ganglia. No acute intracranial abnormalities. Electronically Signed   By: Lavonia Dana M.D.   On: 12/21/2016 19:13   Dg Chest Portable 1 View  Result Date: 12/21/2016 CLINICAL DATA:  Generalized weakness.  Diarrhea. EXAM: PORTABLE CHEST 1 VIEW COMPARISON:  08/08/2016 FINDINGS: Hazy densities in left hilum and the lateral left chest appear to be chronic and could be related to scarring. Again noted are low lung volumes. No new airspace disease or pulmonary edema. Heart size is normal. Atherosclerotic calcifications at the aortic arch. Trachea is midline. IMPRESSION: No active disease. Electronically Signed   By: Markus Daft M.D.   On: 12/21/2016 18:30    Procedures Procedures (including critical care time)  Medications Ordered in ED Medications  levETIRAcetam (KEPPRA) tablet 500 mg (500 mg Oral Given 12/21/16 2201)  acetaminophen (TYLENOL) tablet 650 mg (not administered)    Or  acetaminophen (TYLENOL) solution 650 mg (not administered)    Or  acetaminophen (TYLENOL) suppository 650 mg (not administered)  aspirin suppository 300 mg ( Rectal See Alternative 12/21/16 2201)    Or  aspirin  tablet 325 mg (325 mg Oral Given 12/21/16 2201)  insulin aspart (novoLOG) injection 0-9 Units (not administered)  cefTRIAXone (ROCEPHIN) 1 g in dextrose 5 % 50 mL IVPB (not administered)  nystatin (MYCOSTATIN/NYSTOP) topical powder ( Topical Given 12/21/16 1919)  cefTRIAXone (ROCEPHIN) 1 g in dextrose 5 % 50 mL IVPB (0 g Intravenous Stopped 12/21/16 2102)   stroke: mapping our early stages of recovery book ( Does not apply Given 12/21/16 2130)  sodium chloride 0.9 % bolus 500 mL (500 mLs Intravenous New Bag/Given 12/22/16 0019)     Initial Impression / Assessment and Plan / ED Course  I have reviewed the triage vital signs and the nursing notes.  Pertinent labs & imaging results that were available during my care of the patient were reviewed by me and considered in my medical decision making (see chart for details).    Initial differential diagnosis included CVA, TIA, intracranial mass, UTI, pyelonephritis, deconditioning, and arrhythmia.  Pertinent labs included CBC with mild normocytic anemia; no leukocytosis or abnormal platelet count. CMP with creatinine 1.65 (at baseline); hypoalbuminemia noted, however no other acute electro abnormalities, transaminitis, or anion gap.  UA consistent with infection. Urine culture pending. BNP normal.  Chest x-ray with no acute cardiopulmonary abnormalities, however densities noted in the left hilum.  Head CT notable for an old lacunar infarct of the left basal ganglia; no acute intracranial abnormalities.  EKG notable for a normal PR interval, wide QRS complex with RBBB, and prolonged QTc; no evidence of arrhythmia, ischemia, or infarct.   The patient was given one dose of Rocephin for his UTI. I spoke with the neurology team who agreed to evaluate the patient.  Upon reassessment, the patient had no new symptoms or complaints.  Based on the above findings, I suspect the patient had a CVA at some time while he was incarcerated that he never received ann  appropriate evaluation for. He is now suffering from severe deconditioning and occupational/physical challenges related to his stroke, in addition to a UTI.  The patient was admitted to the Hospitalist service for further evaluation and treatment with Neurology consulting.  The patient was in stable condition at the time of admission.  Final Clinical Impressions(s) / ED Diagnoses  Final diagnoses:  Urinary tract infection without hematuria, site unspecified  Weakness  Physical deconditioning  Cerebrovascular accident (CVA), unspecified mechanism [facility])   New Prescriptions Current Discharge Medication List       Charisse March, MD 12/22/16 Nile Dear, MD 12/24/16 [phone]

## 2016-12-21 NOTE — Telephone Encounter (Signed)
Patient/caregiver called (spoke with billing department) indicating that they will be late for appointment today.  Per [name] Mustache, NP patient will need to reschedule if late. I called patient on both numbers on file to inform him that he will need to reschedule if late due to not seen in 1 year and we will need to get full medical history update.

## 2016-12-21 NOTE — ED Triage Notes (Signed)
Pt arrives via POv from home with family members who state patient developed generalized weakness since being released from prison on Friday. Pt requesting a condom catheter for urinary frequency. VSS.
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
## 2016-12-22 ENCOUNTER — Observation Stay (HOSPITAL_BASED_OUTPATIENT_CLINIC_OR_DEPARTMENT_OTHER): Payer: Medicare Other

## 2016-12-22 ENCOUNTER — Observation Stay (HOSPITAL_COMMUNITY): Payer: Medicare Other

## 2016-12-22 DIAGNOSIS — N390 Urinary tract infection, site not specified: Secondary | ICD-10-CM | POA: Diagnosis not present

## 2016-12-22 DIAGNOSIS — M62421 Contracture of muscle, right upper arm: Secondary | ICD-10-CM

## 2016-12-22 DIAGNOSIS — E785 Hyperlipidemia, unspecified: Secondary | ICD-10-CM | POA: Diagnosis not present

## 2016-12-22 DIAGNOSIS — I639 Cerebral infarction, unspecified: Secondary | ICD-10-CM

## 2016-12-22 DIAGNOSIS — I340 Nonrheumatic mitral (valve) insufficiency: Secondary | ICD-10-CM | POA: Diagnosis not present

## 2016-12-22 DIAGNOSIS — R531 Weakness: Secondary | ICD-10-CM | POA: Diagnosis not present

## 2016-12-22 DIAGNOSIS — E1169 Type 2 diabetes mellitus with other specified complication: Secondary | ICD-10-CM | POA: Diagnosis not present

## 2016-12-22 LAB — CBC
HCT: 33.4 % — ABNORMAL LOW (ref 39.0–52.0)
HEMOGLOBIN: 10.8 g/dL — AB (ref 13.0–17.0)
MCH: 30.8 pg (ref 26.0–34.0)
MCHC: 32.3 g/dL (ref 30.0–36.0)
MCV: 95.2 fL (ref 78.0–100.0)
Platelets: 142 10*3/uL — ABNORMAL LOW (ref 150–400)
RBC: 3.51 MIL/uL — AB (ref 4.22–5.81)
RDW: 15.2 % (ref 11.5–15.5)
WBC: 5.9 10*3/uL (ref 4.0–10.5)

## 2016-12-22 LAB — BASIC METABOLIC PANEL
ANION GAP: 9 (ref 5–15)
BUN: 35 mg/dL — ABNORMAL HIGH (ref 6–20)
CHLORIDE: 106 mmol/L (ref 101–111)
CO2: 26 mmol/L (ref 22–32)
Calcium: 8.7 mg/dL — ABNORMAL LOW (ref 8.9–10.3)
Creatinine, Ser: 1.61 mg/dL — ABNORMAL HIGH (ref 0.61–1.24)
GFR calc non Af Amer: 39 mL/min — ABNORMAL LOW (ref >60)
GFR, EST AFRICAN AMERICAN: 45 mL/min — AB (ref >60)
Glucose, Bld: 110 mg/dL — ABNORMAL HIGH (ref 65–99)
Potassium: 3.6 mmol/L (ref 3.5–5.1)
Sodium: 141 mmol/L (ref 135–145)

## 2016-12-22 LAB — HEMOGLOBIN A1C
HEMOGLOBIN A1C: 5.7 % — AB (ref 4.8–5.6)
MEAN PLASMA GLUCOSE: 116.89 mg/dL

## 2016-12-22 LAB — LIPID PANEL
Cholesterol: 118 mg/dL (ref 0–200)
HDL: 40 mg/dL — AB (ref >40)
LDL CALC: 61 mg/dL (ref 0–99)
TRIGLYCERIDES: 84 mg/dL (ref <150)
Total CHOL/HDL Ratio: 3 RATIO
VLDL: 17 mg/dL (ref 0–40)

## 2016-12-22 LAB — ECHOCARDIOGRAM COMPLETE
Height: 65 in
WEIGHTICAEL: 3387.2 [oz_av]

## 2016-12-22 LAB — GLUCOSE, CAPILLARY
GLUCOSE-CAPILLARY: 172 mg/dL — AB (ref 65–99)
GLUCOSE-CAPILLARY: 178 mg/dL — AB (ref 65–99)
Glucose-Capillary: 106 mg/dL — ABNORMAL HIGH (ref 65–99)
Glucose-Capillary: 147 mg/dL — ABNORMAL HIGH (ref 65–99)

## 2016-12-22 LAB — RAPID URINE DRUG SCREEN, HOSP PERFORMED
AMPHETAMINES: NOT DETECTED
Barbiturates: NOT DETECTED
Benzodiazepines: NOT DETECTED
COCAINE: NOT DETECTED
OPIATES: NOT DETECTED
Tetrahydrocannabinol: NOT DETECTED

## 2016-12-22 LAB — MRSA PCR SCREENING: MRSA by PCR: NEGATIVE

## 2016-12-22 MED ORDER — PERFLUTREN LIPID MICROSPHERE
1.0000 mL | INTRAVENOUS | Status: AC | PRN
  Administered 2016-12-22: 2 mL via INTRAVENOUS
  Filled 2016-12-22: qty 10

## 2016-12-22 MED ORDER — INFLUENZA VAC SPLIT HIGH-DOSE 0.5 ML IM SUSY
0.5000 mL | PREFILLED_SYRINGE | INTRAMUSCULAR | Status: AC
  Administered 2016-12-23: 0.5 mL via INTRAMUSCULAR
  Filled 2016-12-22: qty 0.5

## 2016-12-22 MED ORDER — SODIUM CHLORIDE 0.9 % IV BOLUS (SEPSIS)
500.0000 mL | Freq: Once | INTRAVENOUS | Status: AC
  Administered 2016-12-22: 500 mL via INTRAVENOUS

## 2016-12-22 MED ORDER — STROKE: EARLY STAGES OF RECOVERY BOOK
Freq: Once | Status: AC
  Administered 2016-12-22: 18:00:00
  Filled 2016-12-22: qty 1

## 2016-12-22 MED ORDER — SODIUM CHLORIDE 0.9 % IV BOLUS (SEPSIS)
1000.0000 mL | Freq: Once | INTRAVENOUS | Status: AC
  Administered 2016-12-22: 1000 mL via INTRAVENOUS

## 2016-12-22 MED ORDER — SODIUM CHLORIDE 0.9 % IV SOLN
INTRAVENOUS | Status: DC
  Administered 2016-12-22 – 2016-12-24 (×5): via INTRAVENOUS

## 2016-12-22 NOTE — Progress Notes (Signed)
Attempted to see pt. Pt at a test.  Will check back as schedule allows. Jinger Neighbors, [place] [phone]

## 2016-12-22 NOTE — Procedures (Signed)
ELECTROENCEPHALOGRAM REPORT  Date of Study: 12/22/2016  Patient's Name: [name] [name] MRN: [id number] Date of Birth: 1934-12-03  Referring Provider: Dr. Kerney Elbe  Clinical History: This is a 82 year old man with a diagnosis of seizures, presenting with weakness.  Medications: levETIRAcetam (KEPPRA) tablet 500 mg  acetaminophen (TYLENOL) tablet 650 mg  aspirin tablet 325 mg  cefTRIAXone (ROCEPHIN) 1 g in dextrose 5 % 50 mL IVPB  insulin aspart (novoLOG) injection 0-9 Units   Technical Summary: A multichannel digital EEG recording measured by the international 10-20 system with electrodes applied with paste and impedances below 5000 ohms performed in our laboratory with EKG monitoring in an awake and drowsy patient.  Hyperventilation and photic stimulation were not performed.  The digital EEG was referentially recorded, reformatted, and digitally filtered in a variety of bipolar and referential montages for optimal display.    Description: The patient is awake and drowsy during the recording.  During maximal wakefulness, there is a symmetric, medium voltage 8.5 Hz posterior dominant rhythm that attenuates with eye opening.  The record is symmetric.  During drowsiness, there is an increase in theta slowing of the background, with occasional shifting asymmetry over the bilateral temporal regions. Deeper stages of sleep were not seen.  Hyperventilation and photic stimulation were not performed.  There were no epileptiform discharges or electrographic seizures seen.    EKG lead was unremarkable.  Impression: This awake and drowsy EEG is within normal limits for age.  Clinical Correlation: A normal EEG does not exclude a clinical diagnosis of epilepsy. Clinical correlation is advised.   Ellouise Newer, M.D.

## 2016-12-22 NOTE — Progress Notes (Signed)
Patient's BP at 2308 was manually 88/48; Schorr,NP notified and 569mL normal saline bolus was ordered and given at [place]. When bolus was almost completed, BP began dropping into the 68S-16O systolically. Schorr,NP notified again. Patient's BP started returning to normal range. Schorr,NP returned page and placed an order for a second 577mL normal saline bolus. Will continue to monitor and treat per MD orders.

## 2016-12-22 NOTE — Progress Notes (Addendum)
PROGRESS NOTE [facility]   [name] [name]   DTO:[id number] DOB: [birth year]  DOA: 12/21/2016 PCP: Gildardo Cranker, DO   Brief Narrative:  [name] [name] is a 82 y.o. male with medical history significant of DM, seizure disorder who just released from jail Friday (today is Monday) after being there for a year comes in for weakness.  Pt reports he has been wheelchair bound for months there and was told he had a stroke.  He has gotten minimal therapy. Was not hospitalized per his report and officially diagnosed with a cva.  Denies fevers.  He is chronically incontinent of urine.  He wears a depends.  Pt is staying with a friend of his who is blind.  He comes in due to his weakness for months and ct showing likely new infarct.  Pt reports he has had more difficulty with using his right hand, arm and his right fingers are contracted.  Pt referred for admission for possible stroke.  Subjective: Patient seen and examined, report weakness is about the same. No other concerns today. Denies chest pain, shortness of breath, palpitation and dizziness.  Assessment & Plan: 82 year old male with progressive right upper extremity weakness in conjunction with bilateral hand tremor admitted for working diagnosis of possible stroke.  #1 Generalized weakness Neurology was consulted input appreciated felt to be secondary to Parkinson disease versus seizures. UTI may be contributing Patient with resting tremor in bilateral hand, cogwheel rigidity  Patient started on Sinemet  OP follow up with neurology  MRI no stroke MRI C spine multilevel DJD but no spinal cord compression - outpatient or to or spine evaluation   #2 UTI secondary to Escherichia coli - likely contributing to weakness Pending sensitivities Continue ceftriaxone  #3History of seizures Continue with Keppra  Seizure precautions EEG negative for acute seizures  #4Hypertension Holding BP medications due to soft blood pressure Got IV bolus  x 2 Continue to monitor  #5Diabetes mellitus type 2 CBGs stable Holding oral meds Continue SSI  #6Parkinson disease Started on Sinemet per neurology recommendations PT/OT evaluation  #7 AKI  Hypoperfusion from hypotension and infectious process  Continue IVF  Check BMP in AM   DVT prophylaxis: SCDs Code Status: full code Family Communication: none at bedside Disposition Plan: SNF versus home with home health PT   Consultants:   neurology  Procedures:   EEG negative  Antimicrobials: Anti-infectives    Start     Dose/Rate Route Frequency Ordered Stop   12/22/16 2030  cefTRIAXone (ROCEPHIN) 1 g in dextrose 5 % 50 mL IVPB     1 g 100 mL/hr over 30 Minutes Intravenous Every 24 hours 12/21/16 2129     12/21/16 2015  cefTRIAXone (ROCEPHIN) 1 g in dextrose 5 % 50 mL IVPB     1 g 100 mL/hr over 30 Minutes Intravenous  Once 12/21/16 2002 12/21/16 2102        Objective: Vitals:   12/22/16 1011 12/22/16 1144 12/22/16 1145 12/22/16 1544  BP: (!) 83/26 (!) 92/30 (!) 102/40 100/69  Pulse: 66 (!) 59  61  Resp: 17 20  17   Temp: 98.6 F (37 C)   98.4 F (36.9 C)  TempSrc: Oral   Oral  SpO2: 96% 99%  91%  Weight:      Height:        Intake/Output Summary (Last 24 hours) at 12/22/16 1616 Last data filed at 12/22/16 1144  Gross per 24 hour  Intake  0 ml  Output              900 ml  Net             -900 ml   Filed Weights   12/21/16 2158  Weight: 96 kg (211 lb 11.2 oz)    Examination:  General exam: NAD HEENT: PERRLA, OP moist and clear Respiratory system: Clear to auscultation. No wheezes,crackle or rhonchi Cardiovascular system: S1 & S2 heard, RRR. No JVD, murmurs, rubs or gallops Gastrointestinal system: Abdomen is nondistended, soft and nontender. Normal bowel sounds heard. Central nervous system: Alert and oriented. resting tremor in bilateral hands, cogwheel rigidity. No cranial nerve deficits Extremities: No pedal edema, strength 3-4/5 in  all 4 extremities.  Skin: No rashes, lesions or ulcers Psychiatry: Mood & affect flat  Data Reviewed: I have personally reviewed following labs and imaging studies  CBC:  Recent Labs Lab 12/21/16 1730 12/22/16 0830  WBC 8.2 5.9  HGB 11.6* 10.8*  HCT 34.8* 33.4*  MCV 94.1 95.2  PLT 156 786*   Basic Metabolic Panel:  Recent Labs Lab 12/21/16 1730 12/22/16 0830  NA 138 141  K 4.3 3.6  CL 102 106  CO2 25 26  GLUCOSE 152* 110*  BUN 40* 35*  CREATININE 1.65* 1.61*  CALCIUM 9.0 8.7*   GFR: Estimated Creatinine Clearance: 39.6 mL/min (A) (by C-G formula based on SCr of 1.61 mg/dL (H)). Liver Function Tests:  Recent Labs Lab 12/21/16 1730  AST 33  ALT 20  ALKPHOS 85  BILITOT 1.0  PROT 6.1*  ALBUMIN 3.1*   No results for input(s): LIPASE, AMYLASE in the last 168 hours. No results for input(s): AMMONIA in the last 168 hours. Coagulation Profile: No results for input(s): INR, PROTIME in the last 168 hours. Cardiac Enzymes: No results for input(s): CKTOTAL, CKMB, CKMBINDEX, TROPONINI in the last 168 hours. BNP (last 3 results) No results for input(s): PROBNP in the last 8760 hours. HbA1C:  Recent Labs  12/21/16 2210 12/22/16 0830  HGBA1C 5.8* 5.7*   CBG:  Recent Labs Lab 12/21/16 1355 12/21/16 2302 12/22/16 0826 12/22/16 1201  GLUCAP 138* 128* 106* 147*   Lipid Profile:  Recent Labs  12/22/16 0830  CHOL 118  HDL 40*  LDLCALC 61  TRIG 84  CHOLHDL 3.0   Thyroid Function Tests: No results for input(s): TSH, T4TOTAL, FREET4, T3FREE, THYROIDAB in the last 72 hours. Anemia Panel: No results for input(s): VITAMINB12, FOLATE, FERRITIN, TIBC, IRON, RETICCTPCT in the last 72 hours. Sepsis Labs: No results for input(s): PROCALCITON, LATICACIDVEN in the last 168 hours.  Recent Results (from the past 240 hour(s))  Urine culture     Status: Abnormal (Preliminary result)   Collection Time: 12/21/16  5:35 PM  Result Value Ref Range Status   Specimen  Description URINE, CATHETERIZED  Final   Special Requests NONE  Final   Culture >=100,000 COLONIES/mL ESCHERICHIA COLI (A)  Final   Report Status PENDING  Incomplete  MRSA PCR Screening     Status: None   Collection Time: 12/22/16  8:50 AM  Result Value Ref Range Status   MRSA by PCR NEGATIVE NEGATIVE Final    Comment:        The GeneXpert MRSA Assay (FDA approved for NASAL specimens only), is one component of a comprehensive MRSA colonization surveillance program. It is not intended to diagnose MRSA infection nor to guide or monitor treatment for MRSA infections.       Radiology Studies: Ct  Head Wo Contrast  Result Date: 12/21/2016 CLINICAL DATA:  Sudden extremity weakness, and type II diabetes mellitus EXAM: CT HEAD WITHOUT CONTRAST TECHNIQUE: Contiguous axial images were obtained from the base of the skull through the vertex without intravenous contrast. Sagittal and coronal MPR images reconstructed from axial data set. COMPARISON:  None. FINDINGS: Brain: Normal ventricular morphology. No midline shift or mass effect. Small old lacunar infarct at LEFT basal ganglia. Small vessel chronic ischemic changes of deep cerebral white matter. No intracranial hemorrhage, mass lesion, evidence of acute infarction, or extra-axial fluid collection. Vascular: Atherosclerotic calcification of internal carotid arteries bilaterally at skullbase Skull: Demineralized but intact Sinuses/Orbits: Small mucosal retention cyst versus polyp at RIGHT sphenoid sinus. Remaining paranasal sinuses and mastoid air cells clear. Other: N/A IMPRESSION: Atrophy with small vessel chronic ischemic changes of deep cerebral white matter. Small old lacunar infarct at LEFT basal ganglia. No acute intracranial abnormalities. Electronically Signed   By: Lavonia Dana M.D.   On: 12/21/2016 19:13   Mr Brain Wo Contrast  Result Date: 12/22/2016 CLINICAL DATA:  Progressive right upper extremity weakness. Bilateral hand tremor. EXAM:  MRI HEAD WITHOUT CONTRAST MRI CERVICAL SPINE WITHOUT CONTRAST TECHNIQUE: Multiplanar, multiecho pulse sequences of the brain and surrounding structures, and cervical spine, to include the craniocervical junction and cervicothoracic junction, were obtained without intravenous contrast. COMPARISON:  Head CT 12/21/2016 and MRI 08/08/2016. FINDINGS: MRI HEAD FINDINGS Brain: There is no evidence of acute infarct, intracranial hemorrhage, mass, midline shift, or extra-axial fluid collection. Generalized cerebral atrophy is within normal limits for age. Chronic lacunar infarcts are again noted in the left basal ganglia and left cerebellum. A subcentimeter chronic infarct in the subcortical white matter of the high posterior left frontal lobe is new from the prior MRI. T2 hyperintensities elsewhere in the cerebral white matter bilaterally are similar to the prior MRI allowing for differences in technique and are nonspecific but compatible with mild chronic small vessel ischemic disease. Vascular: Major intracranial vascular flow voids are preserved. Skull and upper cervical spine: Unremarkable bone marrow signal. Sinuses/Orbits: Unremarkable orbits. Paranasal sinuses and mastoid air cells are clear. Other: Unchanged 2 cm right frontal scalp lipoma. MRI CERVICAL SPINE FINDINGS Alignment: Minimal anterolisthesis of C7 on T1. Vertebrae: No fracture suspicious osseous lesion. Moderate marrow edema in the C5 and C6 vertebral bodies is favored to be degenerative, without abnormal disc signal suggestive of infectious discitis. Type 2 degenerative endplate changes are present at C4-5. Cord: Normal signal and morphology. Posterior Fossa, vertebral arteries, paraspinal tissues: Unremarkable. Disc levels: C2-3:  Mild facet arthrosis.  No stenosis. C3-4: Mild disc bulging and right uncovertebral spurring result in mild right neural foraminal stenosis without spinal stenosis. C4-5: Moderate disc space narrowing. Broad-based posterior  disc osteophyte complex asymmetric to the right and infolding of the ligamentum flavum result in mild spinal stenosis and moderate to severe right and mild to moderate left neural foraminal stenosis. C5-6: Severe disc space narrowing. Broad-based posterior disc osteophyte complex and asymmetric right uncovertebral spurring result in mild spinal stenosis and moderate to severe right and mild-to-moderate left neural foraminal stenosis. C6-7: Moderate to severe disc space narrowing. Broad-based posterior disc osteophyte complex results in mild spinal stenosis without significant neural foraminal stenosis. C7-T1:  Mild facet arthrosis without stenosis. IMPRESSION: 1. No acute intracranial abnormality. 2. Chronic small vessel ischemic disease as above. A chronic left frontal subcortical lacunar infarct is new from 07/2016. 3. Multilevel cervical disc degeneration, greatest at C5-6 where there is degenerative endplate edema, mild spinal  stenosis, and moderate to severe right neural foraminal stenosis. Electronically Signed   By: Logan Bores M.D.   On: 12/22/2016 07:44   Mr Cervical Spine Wo Contrast  Result Date: 12/22/2016 CLINICAL DATA:  Progressive right upper extremity weakness. Bilateral hand tremor. EXAM: MRI HEAD WITHOUT CONTRAST MRI CERVICAL SPINE WITHOUT CONTRAST TECHNIQUE: Multiplanar, multiecho pulse sequences of the brain and surrounding structures, and cervical spine, to include the craniocervical junction and cervicothoracic junction, were obtained without intravenous contrast. COMPARISON:  Head CT 12/21/2016 and MRI 08/08/2016. FINDINGS: MRI HEAD FINDINGS Brain: There is no evidence of acute infarct, intracranial hemorrhage, mass, midline shift, or extra-axial fluid collection. Generalized cerebral atrophy is within normal limits for age. Chronic lacunar infarcts are again noted in the left basal ganglia and left cerebellum. A subcentimeter chronic infarct in the subcortical white matter of the high  posterior left frontal lobe is new from the prior MRI. T2 hyperintensities elsewhere in the cerebral white matter bilaterally are similar to the prior MRI allowing for differences in technique and are nonspecific but compatible with mild chronic small vessel ischemic disease. Vascular: Major intracranial vascular flow voids are preserved. Skull and upper cervical spine: Unremarkable bone marrow signal. Sinuses/Orbits: Unremarkable orbits. Paranasal sinuses and mastoid air cells are clear. Other: Unchanged 2 cm right frontal scalp lipoma. MRI CERVICAL SPINE FINDINGS Alignment: Minimal anterolisthesis of C7 on T1. Vertebrae: No fracture suspicious osseous lesion. Moderate marrow edema in the C5 and C6 vertebral bodies is favored to be degenerative, without abnormal disc signal suggestive of infectious discitis. Type 2 degenerative endplate changes are present at C4-5. Cord: Normal signal and morphology. Posterior Fossa, vertebral arteries, paraspinal tissues: Unremarkable. Disc levels: C2-3:  Mild facet arthrosis.  No stenosis. C3-4: Mild disc bulging and right uncovertebral spurring result in mild right neural foraminal stenosis without spinal stenosis. C4-5: Moderate disc space narrowing. Broad-based posterior disc osteophyte complex asymmetric to the right and infolding of the ligamentum flavum result in mild spinal stenosis and moderate to severe right and mild to moderate left neural foraminal stenosis. C5-6: Severe disc space narrowing. Broad-based posterior disc osteophyte complex and asymmetric right uncovertebral spurring result in mild spinal stenosis and moderate to severe right and mild-to-moderate left neural foraminal stenosis. C6-7: Moderate to severe disc space narrowing. Broad-based posterior disc osteophyte complex results in mild spinal stenosis without significant neural foraminal stenosis. C7-T1:  Mild facet arthrosis without stenosis. IMPRESSION: 1. No acute intracranial abnormality. 2. Chronic  small vessel ischemic disease as above. A chronic left frontal subcortical lacunar infarct is new from 07/2016. 3. Multilevel cervical disc degeneration, greatest at C5-6 where there is degenerative endplate edema, mild spinal stenosis, and moderate to severe right neural foraminal stenosis. Electronically Signed   By: Logan Bores M.D.   On: 12/22/2016 07:44   Dg Chest Portable 1 View  Result Date: 12/21/2016 CLINICAL DATA:  Generalized weakness.  Diarrhea. EXAM: PORTABLE CHEST 1 VIEW COMPARISON:  08/08/2016 FINDINGS: Hazy densities in left hilum and the lateral left chest appear to be chronic and could be related to scarring. Again noted are low lung volumes. No new airspace disease or pulmonary edema. Heart size is normal. Atherosclerotic calcifications at the aortic arch. Trachea is midline. IMPRESSION: No active disease. Electronically Signed   By: Markus Daft M.D.   On: 12/21/2016 18:30      Scheduled Meds: .  stroke: mapping our early stages of recovery book   Does not apply Once  . aspirin  300 mg Rectal Daily  Or  . aspirin  325 mg Oral Daily  . insulin aspart  0-9 Units Subcutaneous TID WC  . levETIRAcetam  500 mg Oral BID   Continuous Infusions: . sodium chloride    . cefTRIAXone (ROCEPHIN)  IV       LOS: 0 days    Time spent: Total of 25 minutes spent with pt, greater than 50% of which was spent in discussion of  treatment, counseling and coordination of care    Chipper Oman, MD Pager: Text Page via [email or web address]   If 7PM-7AM, please contact night-coverage [email or web address] 12/22/2016, 4:16 PM

## 2016-12-22 NOTE — Progress Notes (Addendum)
SAME DAY PROGRESS NOTE Pt seen and examined.  Agree with Dr. Yvetta Coder assessment. MRI Brain negative for acute stroke. MRI C-spine has multilvel DJD but no spinal cord signal change to explain the [facility] fidnings.  Most likely sx related to parkinsons.  Impression Gait instability, tremors and weakness - Likely secondary to Parkinson's Syncope v seizure - could also be orthostasis in the setting of Parkinsons, but seizures can not be ruled out.   Recommend  Sinemet 10-100 po TID. C/w Keppra for concern of seizures (due to multiplicity of events per history) EEG - P Outpatient ortho or spine consult for multilevel DJD of c-spine. F/U with OP neurology. Seizure precautions as below  Per [facility] statutes, patients with seizures are not allowed to drive until they have been seizure-free for six months.   Use caution when using heavy equipment or power tools. Avoid working on ladders or at heights. Take showers instead of baths. Ensure the water temperature is not too high on the home water heater. Do not go swimming alone. Do not lock yourself in a room alone (i.e. bathroom). When caring for infants or small children, sit down when holding, feeding, or changing them to minimize risk of injury to the child in the event you have a seizure. Maintain good sleep hygiene. Avoid alcohol.   If patient has another seizure, call 911 and bring them back to the ED if: A. The seizure lasts longer than 5 minutes.  B. The patient doesn't wake shortly after the seizure or has new problems such as difficulty seeing, speaking or moving following the seizure C. The patient was injured during the seizure D. The patient has a temperature over 102 F (39C) E. The patient vomited during the seizure and now is having trouble breathing   -- Amie Portland, MD [facility] [phone]  If 7pm to 7am, please call on call as listed on AMION.   Addendum after EEG 1900 hrs -EEG  normal -Plan as above.  Please call neurology with questions  Amie Portland, MD [facility] [phone]  If 7pm to 7am, please call on call as listed on AMION.

## 2016-12-22 NOTE — Progress Notes (Signed)
NURSING PROGRESS NOTE  ASHELY JOSHUA MRN: [id number] Admission Data: 12/21/16 at 2200 Attending Provider: Phillips Grout, MD PCP: Gildardo Cranker, DO Code status: Full  Allergies: No Known Allergies  Past Medical History:  Diagnosis Date  . Arthritis   . Diabetes mellitus without complication ([place])    type 2  . Prostate disease   . Renal disorder    kidney stones  . Seizures ([place])     Past Surgical History:  Past Surgical History:  Procedure Laterality Date  . CYSTOSCOPY W/ URETERAL STENT PLACEMENT Bilateral 08/10/2015   Procedure: CYSTOSCOPY WITH BILATERAL RETROGRADE PYELOGRAM/BILATERAL URETERAL STENT PLACEMENT;  Surgeon: Kathie Rhodes, MD;  Location: [facility];  Service: Urology;  Laterality: Bilateral;  . CYSTOSCOPY WITH RETROGRADE PYELOGRAM, URETEROSCOPY AND STENT PLACEMENT Bilateral 09/02/2015   Procedure: CYSTOSCOPY BILATERAL STENT REMOVAL BILATERAL RETROGRADE PYELOGRAM, BILATERAL URETEROSCOPY AND BILATERAL STENT PLACEMENT;  Surgeon: Kathie Rhodes, MD;  Location: [facility];  Service: Urology;  Laterality: Bilateral;  . HOLMIUM LASER APPLICATION Bilateral 0/35/0093   Procedure: BILATERAL HOLMIUM LASER  LITHO ;  Surgeon: Kathie Rhodes, MD;  Location: [facility];  Service: Urology;  Laterality: Bilateral;  . JOINT REPLACEMENT Right 2007   hip, Dr. Alvan Dame  . TONSILLECTOMY Bilateral    70 years ago   DEBRA CALABRETTA is a 82 y.o. male patient, arrived to floor in room 5W07 via stretcher, transferred from ED. Patient alert and oriented X 4. No acute distress noted. Denies pain.   Vital signs: Oral temperature 97.9 F (36.6 C), Blood pressure 128/70, Pulse 78, RR 18, SpO2 94 % on room air.   Cardiac monitoring: Telemetry box 5W #33 in place. Second verified by Simone Curia  IV access: Left hand-saline locked; condition patent and no redness.  Skin: intact, no pressure ulcer noted in sacral area. MASD in groin and abrasion on left elbow.   Patient's ID armband verified with patient  and in place. Information packet given to patient. Fall risk assessed, SR up X2, patient able to verbalize understanding of risks associated with falls and to call nurse or staff to assist before getting out of bed. Patient oriented to room and equipment. Call bell within reach.

## 2016-12-22 NOTE — Consult Note (Signed)
NEURO HOSPITALIST CONSULT NOTE   Requestig physician: Dr. Shanon Brow  Reason for Consult: Right upper extremity progressive weakness  History obtained from:  Patient and Chart     HPI:                                                                                                                                          [name] [name] is an 82 y.o. male who presented to the [facility] ED via POV from home with family members who stated that the patient developed generalized weakness since being released from prison on Friday. He went to a doctor's appointment today but was unable to get out of the car due to generalized weakness; he was subsequently sent to the ED. On further interview, the patient states that he also has had worsening focal weakness of his RUE since June, to the point that he is now unable to use the right upper extremity to eat.   He states that he was diagnosed with a seizure in prison after an episode of "passing out" in conjunction with bilateral upper extremity tremor/shaking. He takes Keppra 500 mg BID for this. He also has a history of progressive bilateral hand tremor but has not been diagnosed with an underlying etiology for such.   He endorses worsening urinary urgency and incontinence for the past year.  He also has had diarrhea, about 2-3x/day for several months.    He was evaluated in May of this year after a reported seizure like episode; records review revealed a negative head CT and MRI at that time. He reportedly followed up with a Neurologist in [place]; however, no records are available for that visit.  He has been hypotensive on the floor.   Past Medical History:  Diagnosis Date  . Arthritis   . Diabetes mellitus without complication ([place])    type 2  . Prostate disease   . Renal disorder    kidney stones  . Seizures ([place])     Past Surgical History:  Procedure Laterality Date  . CYSTOSCOPY W/ URETERAL STENT PLACEMENT Bilateral 08/10/2015    Procedure: CYSTOSCOPY WITH BILATERAL RETROGRADE PYELOGRAM/BILATERAL URETERAL STENT PLACEMENT;  Surgeon: Kathie Rhodes, MD;  Location: [facility];  Service: Urology;  Laterality: Bilateral;  . CYSTOSCOPY WITH RETROGRADE PYELOGRAM, URETEROSCOPY AND STENT PLACEMENT Bilateral 09/02/2015   Procedure: CYSTOSCOPY BILATERAL STENT REMOVAL BILATERAL RETROGRADE PYELOGRAM, BILATERAL URETEROSCOPY AND BILATERAL STENT PLACEMENT;  Surgeon: Kathie Rhodes, MD;  Location: [facility];  Service: Urology;  Laterality: Bilateral;  . HOLMIUM LASER APPLICATION Bilateral 9/37/1696   Procedure: BILATERAL HOLMIUM LASER  LITHO ;  Surgeon: Kathie Rhodes, MD;  Location: [facility];  Service: Urology;  Laterality: Bilateral;  . JOINT REPLACEMENT Right 2007   hip, Dr. Alvan Dame  . TONSILLECTOMY Bilateral  52 years ago    Family History  Problem Relation Age of Onset  . Early death Neg Hx   . Kidney disease Neg Hx    Social History:  reports that he has never smoked. He has never used smokeless tobacco. He reports that he does not drink alcohol or use drugs.  No Known Allergies  MEDICATIONS:                                                                                                                     Prior to Admission:  Prescriptions Prior to Admission  Medication Sig Dispense Refill Last Dose  . levETIRAcetam (KEPPRA) 500 MG tablet Take 1 tablet (500 mg total) by mouth 2 (two) times daily. 60 tablet 0 12/18/2016 at am  . lisinopril-hydrochlorothiazide (PRINZIDE,ZESTORETIC) 20-12.5 MG tablet Take 1 tablet by mouth daily.   12/21/2016 at am  . loperamide (IMODIUM A-D) 2 MG tablet Take 2-4 mg by mouth 4 (four) times daily as needed for diarrhea or loose stools.   Past Week at Unknown time  . simvastatin (ZOCOR) 20 MG tablet Take 20 mg by mouth daily.    12/21/2016 at am  . tamsulosin (FLOMAX) 0.4 MG CAPS capsule Take 1 capsule (0.4 mg total) by mouth daily after supper. (Patient taking differently: Take 0.4 mg by mouth daily. ) 30 capsule  0 12/21/2016 at am  . HYDROcodone-acetaminophen (NORCO) 10-325 MG tablet Take 1-2 tablets by mouth every 4 (four) hours as needed for moderate pain. Maximum dose per 24 hours - 8 pills (Patient not taking: Reported on 12/21/2016) 30 tablet 0 Not Taking at Unknown time  . metFORMIN (GLUCOPHAGE) 1000 MG tablet Take 1,000 mg by mouth 2 (two) times daily with a meal.   08/10/2016 at am  . phenazopyridine (PYRIDIUM) 200 MG tablet Take 1 tablet (200 mg total) by mouth 3 (three) times daily as needed for pain. 30 tablet 0 Completed Course at Unknown time  . sitaGLIPtin (JANUVIA) 50 MG tablet Take 1 tablet (50 mg total) by mouth daily. 90 tablet 3 Past Month at Unknown time  . Tdap (BOOSTRIX) 5-2.5-18.5 LF-MCG/0.5 injection Inject 0.5 mLs into the muscle once. (Patient not taking: Reported on 12/21/2016) 0.5 mL 0 Not Taking at Unknown time  . Zoster Vaccine Live, PF, (ZOSTAVAX) 19509 UNT/0.65ML injection Inject 19,400 Units into the skin once. (Patient not taking: Reported on 12/21/2016) 1 each 0 Not Taking at Unknown time   Scheduled: . aspirin  300 mg Rectal Daily   Or  . aspirin  325 mg Oral Daily  . insulin aspart  0-9 Units Subcutaneous TID WC  . levETIRAcetam  500 mg Oral BID    ROS:  Denies vision changes. Has urinary and fecal incontinence. Endorses bilateral leg swelling. No chills or fever. No chest pain or SOB. Other ROS as per HPI.   Blood pressure (!) 115/57, pulse 98, temperature 98.1 F (36.7 C), temperature source Oral, resp. rate 18, height 5\' 5"  (1.651 m), weight 96 kg (211 lb 11.2 oz), SpO2 93 %.   General Examination:                                                                                                      HEENT-  [place]/AT    Lungs- Respirations unlabored Extremities- Pitting edema lower legs bilaterally, left worse than right  Neurological  Examination Mental Status: Alert, oriented, non-agitated. Poor eye contact. Speech fluent with intact comprehension and naming. Able to follow all commands without difficulty. Cranial Nerves: II: Visual fields intact. PERRL.   III,IV, VI: EOMI without nystagmus. No ptosis noted.  V,VII: Smile symmetric. Facial temp sensation intact.  VIII: hearing intact to voice IX,X: No hypophonia XI: No asymmetry noted XII: midline tongue extension Motor: RUE: Poor effort improves with coaching. Max strength elicited is 4/5 proximal and distal. Increased wrist flexor and extensor tone. Decreased tone to digits. Rest tremor involving the thumb is noted. Mild cogwheel rigidity.  LUE:  Poor effort improves with coaching. Max strength elicited is 4/5 proximal and distal. Rest tremor involving the thumb is noted. Minimal to mild cogwheel rigidity.  Bilateral lower extremities: 4+/5 bilaterally, proximal and distal Sensory: Temp and light touch intact proximally in all 4 extremities.  Deep Tendon Reflexes: 1+ brachioradialis, biceps and patellae bilaterally. Absent achilles reflexes bilaterally.  Plantars: Right: upgoing   Left: upgoing Cerebellar: No ataxia with FNF, but slowing noted bilaterally, worse on the right.  Gait: Deferred   Lab Results: Basic Metabolic Panel:  Recent Labs Lab 12/21/16 1730  NA 138  K 4.3  CL 102  CO2 25  GLUCOSE 152*  BUN 40*  CREATININE 1.65*  CALCIUM 9.0    Liver Function Tests:  Recent Labs Lab 12/21/16 1730  AST 33  ALT 20  ALKPHOS 85  BILITOT 1.0  PROT 6.1*  ALBUMIN 3.1*   No results for input(s): LIPASE, AMYLASE in the last 168 hours. No results for input(s): AMMONIA in the last 168 hours.  CBC:  Recent Labs Lab 12/21/16 1730  WBC 8.2  HGB 11.6*  HCT 34.8*  MCV 94.1  PLT 156    Cardiac Enzymes: No results for input(s): CKTOTAL, CKMB, CKMBINDEX, TROPONINI in the last 168 hours.  Lipid Panel: No results for input(s): CHOL, TRIG, HDL,  CHOLHDL, VLDL, LDLCALC in the last 168 hours.  CBG:  Recent Labs Lab 12/21/16 1355 12/21/16 2302  GLUCAP 138* 128*    Microbiology: Results for orders placed or performed during the hospital encounter of 08/10/16  Urine culture     Status: None   Collection Time: 08/10/16  5:26 PM  Result Value Ref Range Status   Specimen Description URINE, RANDOM  Final   Special Requests NONE  Final   Culture NO GROWTH  Final  Report Status 08/12/2016 FINAL  Final    Coagulation Studies: No results for input(s): LABPROT, INR in the last 72 hours.  Imaging: Ct Head Wo Contrast  Result Date: 12/21/2016 CLINICAL DATA:  Sudden extremity weakness, and type II diabetes mellitus EXAM: CT HEAD WITHOUT CONTRAST TECHNIQUE: Contiguous axial images were obtained from the base of the skull through the vertex without intravenous contrast. Sagittal and coronal MPR images reconstructed from axial data set. COMPARISON:  None. FINDINGS: Brain: Normal ventricular morphology. No midline shift or mass effect. Small old lacunar infarct at LEFT basal ganglia. Small vessel chronic ischemic changes of deep cerebral white matter. No intracranial hemorrhage, mass lesion, evidence of acute infarction, or extra-axial fluid collection. Vascular: Atherosclerotic calcification of internal carotid arteries bilaterally at skullbase Skull: Demineralized but intact Sinuses/Orbits: Small mucosal retention cyst versus polyp at RIGHT sphenoid sinus. Remaining paranasal sinuses and mastoid air cells clear. Other: N/A IMPRESSION: Atrophy with small vessel chronic ischemic changes of deep cerebral white matter. Small old lacunar infarct at LEFT basal ganglia. No acute intracranial abnormalities. Electronically Signed   By: Lavonia Dana M.D.   On: 12/21/2016 19:13   Dg Chest Portable 1 View  Result Date: 12/21/2016 CLINICAL DATA:  Generalized weakness.  Diarrhea. EXAM: PORTABLE CHEST 1 VIEW COMPARISON:  08/08/2016 FINDINGS: Hazy densities in  left hilum and the lateral left chest appear to be chronic and could be related to scarring. Again noted are low lung volumes. No new airspace disease or pulmonary edema. Heart size is normal. Atherosclerotic calcifications at the aortic arch. Trachea is midline. IMPRESSION: No active disease. Electronically Signed   By: Markus Daft M.D.   On: 12/21/2016 18:30    Assessment: 82 year old male with progressive right upper extremity weakness in conjunction with bilateral hand tremor 1. Exam reveals upgoing toes bilaterally, in conjunction with bilateral upper extremity weakness. A lesion in the cervical spinal cord is a consideration. Also possible regarding RUE weakness is a new cerebral lesion.  2. Old MRI from May shows a chronic left basal ganglia lacune and chronic small vessel ischemic changes. 3. CT head reveals atrophy with small vessel chronic ischemic changes of deep cerebral white matter. Small old lacunar infarct at LEFT basal ganglia. No acute intracranial abnormalities.  4. Cogwheel rigidity and pill rolling tremor present bilaterally on exam. DDx includes Parkinson's disease.  5. Reported history of seizures diagnosed during his prison stay. Currently on Keppra. Based upon his description of the event which was thought to be a seizure, a syncopal convulsion is also a consideration.   Recommendations: 1. EEG. Continue Keppra for now. 2. MRI brain and cervical spine. If negative, may need EMG/NCS of RUE.  3. PT/OT 4. Will need outpatient follow up with a movement disorders specialist to further assess his tremor and cogwheel rigidity. Parkinsonism could also explain his progressive upper extremity weakness if MRI of brain and cervical spine are negative for new lesion.  5. Agree with starting ASA.    Electronically signed: Dr. Kerney Elbe 12/22/2016, 4:40 AM

## 2016-12-22 NOTE — Evaluation (Addendum)
Occupational Therapy Evaluation Patient Details Name: [name] [name] MRN: [id number] DOB: 1935/01/14 Today's Date: 12/22/2016    History of Present Illness 82 y.o. male with medical history significant of DM, seizure disorder who just released from jail on 12/18/16 after being there for a year, comes in for weakness; neurology consulted and feel Pt's weakness may be due to Parkinson disease versus seizures. MRI Brain negative for acute stroke; MRI C-spine has multilvel DJD but no spinal cord signal change    Clinical Impression   This 82 y/o M presents with the above. Pt currently requires MaxA assist for bed mobility and +2 assist for functional mobility, taking side steps along EOB this session at RW level with MinA +2. Pt requires Max-total assist for LB ADLs with limitations including generalized weakness and bil UE functional deficits. Pt will benefit from continued acute OT services and recommend further OT services in SNF setting after discharge to maximize Pt's safety and independence with ADLs and functional mobility.     Follow Up Recommendations  SNF;Supervision/Assistance - 24 hour    Equipment Recommendations  Other (comment) (defer to next venue )           Precautions / Restrictions Precautions Precautions: Fall Restrictions Weight Bearing Restrictions:No      Mobility Bed Mobility Overal bed mobility: Needs Assistance Bed Mobility:  Rolling;Sidelying to Sit;Sit to Sidelying Rolling: Max assist Sidelying to sit: Max assist     Sit to sidelying: Max assist General bed mobility comments:  assist to bring LEs over EOB and heavy assist to bring trunk into upright position; Pt able to scoot self along EOB using UEs prior to return to supine, requires assist for LE management when returnting to sidelying/supine  Transfers Overall transfer level: Needs assistance Equipment used: Rolling walker (2 wheeled) Transfers:  Sit to/from Stand Sit to Stand:  Min assist;+2  physical assistance;+2 safety/equipment;From elevated surface         General transfer comment: Pt using front of RW to initially rise due to UE deficits impacting his ability to place his hands properly on RW initially, once in standing Pt was able to adjust hand placement accordingly; Pt took small shuffled side steps along EOB using RW prior to return to sitting with verbal cues for hand placement     Balance Overall balance assessment: Needs assistance Sitting-balance support: Feet supported Sitting balance-Leahy Scale: Good Sitting balance - Comments: static sitting EOB    Standing balance support: Bilateral upper extremity supported Standing balance-Leahy Scale: Poor Standing balance comment: reliant on UE support                            ADL either performed or assessed with clinical judgement   ADL Overall ADL's : Needs assistance/impaired Eating/Feeding: Set up;Sitting   Grooming: Set up;Sitting   Upper Body Bathing: Minimal assistance;Sitting   Lower Body Bathing: Moderate assistance;Sit to/from stand;+2 for safety/equipment;+2 for physical assistance   Upper Body Dressing : Moderate assistance;Sitting Upper Body Dressing Details (indicate cue type and reason): doffing/donning new gown  Lower Body Dressing: Maximal assistance;Sit to/from stand;+2 for physical assistance;+2 for safety/equipment Lower Body Dressing Details (indicate cue type and reason): Pt able to reach towards feet to adjust socks while sitting EOB, total assist to don initially      Toileting- Clothing Manipulation and Hygiene: Total assistance;Bed level;+2 for physical assistance;+2 for safety/equipment Toileting - Clothing Manipulation Details (indicate cue type and reason): Pt using bedpan  upon entering room; required assist for rolling to sidelying with additional assist to complete perihygiene after BM      Functional mobility during ADLs: Minimal assistance;+2 for physical  assistance;+2 for safety/equipment;Rolling walker General ADL Comments: Pt able to maintain static sitting EOB during session with close guard for safety; stood at RW and took side steps along EOB with MinA +2 during transfer and mobility                          Pertinent Vitals/Pain Pain Assessment: Faces Faces Pain Scale: Hurts a little bit Pain Location: knee, bil shoulders (both chronic injuries)  Pain Descriptors / Indicators: Sore Pain Intervention(s): Limited activity within patient's tolerance     Hand Dominance Right   Extremity/Trunk Assessment Upper Extremity Assessment Upper Extremity Assessment: RUE deficits/detail;LUE deficits/detail RUE Deficits / Details: Pt demonstrates shoulder flexion AROM grossly 0-90*; elbow flexion/extension WFL RUE: Unable to fully assess due to pain LUE Deficits / Details: Pt demonstrates shoulder flexion AROM grossly 0-110*; elbow flexion/extension WFL; resting tremors noted  LUE: Unable to fully assess due to pain   Lower Extremity Assessment Lower Extremity Assessment: RLE deficits/detail;LLE deficits/detail RLE Deficits / Details: grossly weak at 3+ tp 4-.  stiff and sore. RLE Coordination: decreased fine motor LLE Deficits / Details: grossly 3+ to 4-, stiff and sore LLE Coordination: decreased fine motor   Cervical / Trunk Assessment Cervical / Trunk Assessment: Kyphotic   Communication Communication Communication: No difficulties   Cognition Arousal/Alertness: Awake/alert Behavior During Therapy: WFL for tasks assessed/performed Overall Cognitive Status: Within Functional Limits for tasks assessed                                                      Home Living Family/patient expects to be discharged to:: Private residence Living Arrangements: Spouse/significant other                               Additional Comments: After release from jail and prior to this admission Pt was  staying with his girlfriend and girlfriend's two sons who are available intermittently       Prior Functioning/Environment Level of Independence: Needs assistance  Gait / Transfers Assistance Needed: Pt was mostly w/c bound, reports he completed stand pivot transfers to/from w/c, also reports he was able to push w/c (using w/c for UE suport) to ambulate up and down basketball court while incarcerated               OT Problem List: Decreased strength;Impaired balance (sitting and/or standing);Decreased activity tolerance;Decreased range of motion;Decreased knowledge of use of DME or AE;Impaired UE functional use      OT Treatment/Interventions: Self-care/ADL training;DME and/or AE instruction;Therapeutic activities;Balance training;Therapeutic exercise;Energy conservation;Patient/family education    OT Goals(Current goals can be found in the care plan section) Acute Rehab OT Goals Patient Stated Goal: increase independence, go home OT Goal Formulation: With patient Time For Goal Achievement: 01/05/17 Potential to Achieve Goals: Good ADL Goals Pt Will Perform Grooming: sitting;with modified independence Pt Will Perform Upper Body Bathing: with set-up;sitting Pt Will Perform Lower Body Bathing: with mod assist;sit to/from stand Pt Will Perform Lower Body Dressing: with mod assist;with adaptive equipment;sit to/from stand (AE PRN ) Pt Will Transfer to Toilet:  with mod assist;stand pivot transfer;bedside commode Pt Will Perform Toileting - Clothing Manipulation and hygiene: with mod assist;sit to/from stand  OT Frequency: Min 2X/week                PT/OT/SLP Co-Evaluation/Treatment: Yes Reason for Co-Treatment: Complexity of the patient's impairments (multi-system involvement);For patient/therapist safety;To address functional/ADL transfers PT goals addressed during session: Mobility/safety with mobility OT goals addressed during session: ADL's and self-care      AM-PAC PT "6  Clicks" Daily Activity     Outcome Measure Help from another person eating meals?: A Little Help from another person taking care of personal grooming?: A Little Help from another person toileting, which includes using toliet, bedpan, or urinal?: Total Help from another person bathing (including washing, rinsing, drying)?: A Lot Help from another person to put on and taking off regular upper body clothing?: A Lot Help from another person to put on and taking off regular lower body clothing?: A Lot 6 Click Score: 13   End of Session Equipment Utilized During Treatment: Rolling walker Nurse Communication: Mobility status  Activity Tolerance: Patient tolerated treatment well Patient left: in bed;with call bell/phone within reach  OT Visit Diagnosis: Unsteadiness on feet (R26.81);Other abnormalities of gait and mobility (R26.89);Muscle weakness (generalized) (M62.81)                Time: 3614-4315 OT Time Calculation (min): 36 min Charges:  OT General Charges $OT Visit: 1 Visit OT Evaluation $OT Eval Moderate Complexity: 1 Mod G-Codes: OT G-codes **NOT FOR INPATIENT CLASS** Functional Assessment Tool Used: AM-PAC 6 Clicks Daily Activity;Clinical judgement Functional Limitation: Self care Self Care Current Status (Q0086): At least 60 percent but less than 80 percent impaired, limited or restricted Self Care Goal Status (P6195): At least 20 percent but less than 40 percent impaired, limited or restricted   Lou Cal, OT Pager [phone] 12/22/2016   Raymondo Band 12/22/2016, 5:42 PM

## 2016-12-22 NOTE — Evaluation (Signed)
Physical Therapy Evaluation Patient Details Name: [name] [name] MRN: [id number] DOB: [birth year] Today's Date: 12/22/2016   History of Present Illness   82 y.o. male with medical history significant of DM, seizure disorder who just released from jail on 12/18/16 after being there for a year, comes in for weakness; neurology consulted and feel Pt's weakness may be due to Parkinson disease versus seizures. MRI Brain negative for acute stroke; MRI C-spine has multilvel DJD but no spinal cord signal change   Clinical Impression  Pt admitted with/for the above complications/problems.  Pt is significantly weak needing min to max assist for mobility.  Pt currently limited functionally due to the problems listed. ( See problems list.)   Pt will benefit from PT to maximize function and safety in order to get ready for next venue listed below.     Follow Up Recommendations SNF    Equipment Recommendations  Other (comment) (TBA)    Recommendations for Other Services       Precautions / Restrictions Precautions Precautions: Fall Restrictions Weight Bearing Restrictions: No      Mobility  Bed Mobility Overal bed mobility: Needs Assistance Bed Mobility: Rolling;Sidelying to Sit;Sit to Sidelying Rolling: Max assist Sidelying to sit: Max assist     Sit to sidelying: Max assist General bed mobility comments: assist to bring LEs over EOB and heavy assist to bring trunk into upright position; Pt able to scoot self along EOB using UEs prior to return to supine, requires assist for LE management when returnting to sidelying/supine  Transfers Overall transfer level: Needs assistance Equipment used: Rolling walker (2 wheeled) Transfers: Sit to/from Stand Sit to Stand: Min assist;+2 physical assistance;+2 safety/equipment;From elevated surface         General transfer comment: Pt using front of RW to initially rise due to UE deficits impacting his ability to place his hands properly on RW  initially, once in standing Pt was able to adjust hand placement accordingly; Pt took small shuffled side steps along EOB using RW prior to return to sitting with verbal cues for hand placement   Ambulation/Gait Ambulation/Gait assistance: Min assist;+2 safety/equipment Ambulation Distance (Feet): 2 Feet Assistive device: Rolling walker (2 wheeled)       General Gait Details: pt sidestepped with small steps and use of the RW,  Stairs            Wheelchair Mobility    Modified Rankin (Stroke Patients Only)       Balance Overall balance assessment: Needs assistance Sitting-balance support: Feet supported Sitting balance-Leahy Scale: Good Sitting balance - Comments: static sitting EOB    Standing balance support: Bilateral upper extremity supported Standing balance-Leahy Scale: Poor Standing balance comment: reliant on UE support                              Pertinent Vitals/Pain Pain Assessment: Faces Faces Pain Scale: Hurts a little bit Pain Location: knee, bil shoulders (both chronic injuries)  Pain Descriptors / Indicators: Sore Pain Intervention(s): Limited activity within patient's tolerance    Home Living Family/patient expects to be discharged to:: Private residence Living Arrangements: Spouse/significant other               Additional Comments: After release from jail and prior to this admission Pt was staying with his girlfriend and girlfriend's two sons who are available intermittently     Prior Function Level of Independence: Needs assistance   Gait / Transfers  Assistance Needed: Pt was mostly w/c bound, reports he completed stand pivot transfers to/from w/c, also reports he was able to push w/c (using w/c for UE suport) to ambulate up and down basketball court while incarcerated            Hand Dominance   Dominant Hand: Right    Extremity/Trunk Assessment   Upper Extremity Assessment Upper Extremity Assessment: RUE  deficits/detail;LUE deficits/detail RUE Deficits / Details: Pt demonstrates shoulder flexion AROM grossly 0-90*; elbow flexion/extension WFL RUE: Unable to fully assess due to pain LUE Deficits / Details: Pt demonstrates shoulder flexion AROM grossly 0-110*; elbow flexion/extension WFL; resting tremors noted  LUE: Unable to fully assess due to pain    Lower Extremity Assessment Lower Extremity Assessment: RLE deficits/detail;LLE deficits/detail RLE Deficits / Details: grossly weak at 3+ tp 4-.  stiff and sore. RLE Coordination: decreased fine motor LLE Deficits / Details: grossly 3+ to 4-, stiff and sore LLE Coordination: decreased fine motor    Cervical / Trunk Assessment Cervical / Trunk Assessment: Kyphotic  Communication   Communication: No difficulties  Cognition Arousal/Alertness: Awake/alert Behavior During Therapy: WFL for tasks assessed/performed Overall Cognitive Status: Within Functional Limits for tasks assessed                                        General Comments      Exercises     Assessment/Plan    PT Assessment Patient needs continued PT services  PT Problem List Decreased strength;Decreased activity tolerance;Decreased range of motion;Decreased balance;Decreased mobility;Pain;Decreased knowledge of use of DME       PT Treatment Interventions Gait training;DME instruction;Functional mobility training;Therapeutic activities;Therapeutic exercise;Patient/family education;Balance training    PT Goals (Current goals can be found in the Care Plan section)  Acute Rehab PT Goals Patient Stated Goal: increase independence, go home PT Goal Formulation: With patient Time For Goal Achievement: 01/05/17 Potential to Achieve Goals: Fair    Frequency Min 3X/week   Barriers to discharge        Co-evaluation               AM-PAC PT "6 Clicks" Daily Activity  Outcome Measure Difficulty turning over in bed (including adjusting bedclothes,  sheets and blankets)?: Unable Difficulty moving from lying on back to sitting on the side of the bed? : Unable Difficulty sitting down on and standing up from a chair with arms (e.g., wheelchair, bedside commode, etc,.)?: Unable Help needed moving to and from a bed to chair (including a wheelchair)?: A Lot Help needed walking in hospital room?: A Lot Help needed climbing 3-5 steps with a railing? : A Lot 6 Click Score: 9    End of Session   Activity Tolerance: Patient tolerated treatment well;Patient limited by pain Patient left: in bed;with call bell/phone within reach Nurse Communication: Mobility status PT Visit Diagnosis: Unsteadiness on feet (R26.81);Other abnormalities of gait and mobility (R26.89);Muscle weakness (generalized) (M62.81);Pain Pain - Right/Left:  (both) Pain - part of body: Leg;Shoulder    Time: 3762-8315 PT Time Calculation (min) (ACUTE ONLY): 36 min   Charges:   PT Evaluation $PT Eval Moderate Complexity: 1 Mod     PT G Codes:   PT G-Codes **NOT FOR INPATIENT CLASS** Functional Assessment Tool Used: AM-PAC 6 Clicks Basic Mobility;Clinical judgement Functional Limitation: Mobility: Walking and moving around Mobility: Walking and Moving Around Current Status (V7616): At least 20 percent but  less than 40 percent impaired, limited or restricted Mobility: Walking and Moving Around Goal Status [phone]): At least 1 percent but less than 20 percent impaired, limited or restricted    12/22/2016  Donnella Sham, PT [phone]  (pager)  Tessie Fass Elecia Serafin 12/22/2016, 5:37 PM

## 2016-12-22 NOTE — Progress Notes (Signed)
*  PRELIMINARY RESULTS* Vascular Ultrasound Carotid Duplex (Doppler) has been completed.  Preliminary findings: The right internal carotid artery demonstrates a 1-39% stenosis, the proximal left internal carotid artery demonstrates a 60-79% stenosis. Bilateral vertebral arteries appear patent and antegrade.  Aiven Kampe C Kao Conry 12/22/2016, 1:20 PM

## 2016-12-22 NOTE — Progress Notes (Signed)
Dr. Quincy Simmonds made aware of bp 102/40 okay to leave unit for testing.

## 2016-12-23 ENCOUNTER — Inpatient Hospital Stay (HOSPITAL_COMMUNITY): Payer: Medicare Other

## 2016-12-23 DIAGNOSIS — N390 Urinary tract infection, site not specified: Secondary | ICD-10-CM | POA: Diagnosis not present

## 2016-12-23 DIAGNOSIS — Z993 Dependence on wheelchair: Secondary | ICD-10-CM | POA: Diagnosis not present

## 2016-12-23 DIAGNOSIS — I10 Essential (primary) hypertension: Secondary | ICD-10-CM | POA: Diagnosis present

## 2016-12-23 DIAGNOSIS — E1169 Type 2 diabetes mellitus with other specified complication: Secondary | ICD-10-CM | POA: Diagnosis not present

## 2016-12-23 DIAGNOSIS — Z8673 Personal history of transient ischemic attack (TIA), and cerebral infarction without residual deficits: Secondary | ICD-10-CM | POA: Diagnosis not present

## 2016-12-23 DIAGNOSIS — I959 Hypotension, unspecified: Secondary | ICD-10-CM | POA: Diagnosis present

## 2016-12-23 DIAGNOSIS — E785 Hyperlipidemia, unspecified: Secondary | ICD-10-CM | POA: Diagnosis not present

## 2016-12-23 DIAGNOSIS — Z87442 Personal history of urinary calculi: Secondary | ICD-10-CM | POA: Diagnosis not present

## 2016-12-23 DIAGNOSIS — N179 Acute kidney failure, unspecified: Secondary | ICD-10-CM | POA: Diagnosis present

## 2016-12-23 DIAGNOSIS — Z96641 Presence of right artificial hip joint: Secondary | ICD-10-CM | POA: Diagnosis present

## 2016-12-23 DIAGNOSIS — E1122 Type 2 diabetes mellitus with diabetic chronic kidney disease: Secondary | ICD-10-CM | POA: Diagnosis present

## 2016-12-23 DIAGNOSIS — N183 Chronic kidney disease, stage 3 (moderate): Secondary | ICD-10-CM | POA: Diagnosis present

## 2016-12-23 DIAGNOSIS — B9620 Unspecified Escherichia coli [E. coli] as the cause of diseases classified elsewhere: Secondary | ICD-10-CM | POA: Diagnosis present

## 2016-12-23 DIAGNOSIS — R531 Weakness: Secondary | ICD-10-CM | POA: Diagnosis not present

## 2016-12-23 DIAGNOSIS — I493 Ventricular premature depolarization: Secondary | ICD-10-CM | POA: Diagnosis present

## 2016-12-23 DIAGNOSIS — Z1624 Resistance to multiple antibiotics: Secondary | ICD-10-CM | POA: Diagnosis present

## 2016-12-23 DIAGNOSIS — R569 Unspecified convulsions: Secondary | ICD-10-CM | POA: Diagnosis present

## 2016-12-23 DIAGNOSIS — Z79899 Other long term (current) drug therapy: Secondary | ICD-10-CM | POA: Diagnosis not present

## 2016-12-23 DIAGNOSIS — Z23 Encounter for immunization: Secondary | ICD-10-CM | POA: Diagnosis present

## 2016-12-23 DIAGNOSIS — Z9889 Other specified postprocedural states: Secondary | ICD-10-CM | POA: Diagnosis not present

## 2016-12-23 DIAGNOSIS — G40909 Epilepsy, unspecified, not intractable, without status epilepticus: Secondary | ICD-10-CM | POA: Diagnosis present

## 2016-12-23 DIAGNOSIS — I6522 Occlusion and stenosis of left carotid artery: Secondary | ICD-10-CM | POA: Diagnosis present

## 2016-12-23 DIAGNOSIS — M1990 Unspecified osteoarthritis, unspecified site: Secondary | ICD-10-CM | POA: Diagnosis present

## 2016-12-23 DIAGNOSIS — N3941 Urge incontinence: Secondary | ICD-10-CM | POA: Diagnosis present

## 2016-12-23 DIAGNOSIS — I129 Hypertensive chronic kidney disease with stage 1 through stage 4 chronic kidney disease, or unspecified chronic kidney disease: Secondary | ICD-10-CM | POA: Diagnosis present

## 2016-12-23 DIAGNOSIS — M62421 Contracture of muscle, right upper arm: Secondary | ICD-10-CM | POA: Diagnosis present

## 2016-12-23 DIAGNOSIS — Z7984 Long term (current) use of oral hypoglycemic drugs: Secondary | ICD-10-CM | POA: Diagnosis not present

## 2016-12-23 LAB — GLUCOSE, CAPILLARY
GLUCOSE-CAPILLARY: 122 mg/dL — AB (ref 65–99)
Glucose-Capillary: 102 mg/dL — ABNORMAL HIGH (ref 65–99)
Glucose-Capillary: 102 mg/dL — ABNORMAL HIGH (ref 65–99)
Glucose-Capillary: 184 mg/dL — ABNORMAL HIGH (ref 65–99)

## 2016-12-23 LAB — BASIC METABOLIC PANEL
ANION GAP: 9 (ref 5–15)
BUN: 31 mg/dL — ABNORMAL HIGH (ref 6–20)
CALCIUM: 8.3 mg/dL — AB (ref 8.9–10.3)
CHLORIDE: 108 mmol/L (ref 101–111)
CO2: 24 mmol/L (ref 22–32)
Creatinine, Ser: 1.53 mg/dL — ABNORMAL HIGH (ref 0.61–1.24)
GFR calc Af Amer: 48 mL/min — ABNORMAL LOW (ref >60)
GFR calc non Af Amer: 42 mL/min — ABNORMAL LOW (ref >60)
GLUCOSE: 118 mg/dL — AB (ref 65–99)
POTASSIUM: 3.8 mmol/L (ref 3.5–5.1)
Sodium: 141 mmol/L (ref 135–145)

## 2016-12-23 LAB — CBC
HEMATOCRIT: 31.6 % — AB (ref 39.0–52.0)
HEMOGLOBIN: 10.3 g/dL — AB (ref 13.0–17.0)
MCH: 30.7 pg (ref 26.0–34.0)
MCHC: 32.6 g/dL (ref 30.0–36.0)
MCV: 94 fL (ref 78.0–100.0)
Platelets: 151 10*3/uL (ref 150–400)
RBC: 3.36 MIL/uL — ABNORMAL LOW (ref 4.22–5.81)
RDW: 14.8 % (ref 11.5–15.5)
WBC: 6.5 10*3/uL (ref 4.0–10.5)

## 2016-12-23 LAB — URINE CULTURE

## 2016-12-23 LAB — MAGNESIUM: Magnesium: 1.7 mg/dL (ref 1.7–2.4)

## 2016-12-23 MED ORDER — POTASSIUM CHLORIDE CRYS ER 20 MEQ PO TBCR
40.0000 meq | EXTENDED_RELEASE_TABLET | Freq: Once | ORAL | Status: AC
  Administered 2016-12-23: 40 meq via ORAL
  Filled 2016-12-23: qty 2

## 2016-12-23 NOTE — Progress Notes (Signed)
CCMD reporting patient having more frequent PVC's. Provider notified.

## 2016-12-23 NOTE — Progress Notes (Signed)
MD notified of increased PVC's on telemetry. Tele shows trigeminy and sometimes bigeminy. No new orders at this time.

## 2016-12-23 NOTE — Evaluation (Signed)
Speech Language Pathology Evaluation Patient Details Name: [name] [name] MRN: [id number] DOB: [birth year] Today's Date: 12/23/2016 Time: 0950-1020 SLP Time Calculation (min) (ACUTE ONLY): 30 min  Problem List:  Patient Active Problem List   Diagnosis Date Noted  . .0... 12/21/2016  . Weakness 12/21/2016  . Contracture of muscle of right upper arm 12/21/2016  . UTI (urinary tract infection) 12/21/2016  . Seizures ([place])   . Left knee pain 09/06/2015  . Hyperlipidemia associated with type 2 diabetes mellitus ([place]) 09/06/2015  . Tremor 09/06/2015  . Urinary frequency 09/06/2015  . Acute urinary retention 08/12/2015  . Hyperkalemia 08/10/2015  . Acute renal failure ([place]) 08/10/2015  . Acute bilateral obstructive uropathy 08/10/2015  . HTN (hypertension) 08/10/2015  . Diabetes mellitus, type 2 ([place]) 08/10/2015   Past Medical History:  Past Medical History:  Diagnosis Date  . Arthritis   . Diabetes mellitus without complication ([place])    type 2  . Prostate disease   . Renal disorder    kidney stones  . Seizures ([place])    Past Surgical History:  Past Surgical History:  Procedure Laterality Date  . CYSTOSCOPY W/ URETERAL STENT PLACEMENT Bilateral 08/10/2015   Procedure: CYSTOSCOPY WITH BILATERAL RETROGRADE PYELOGRAM/BILATERAL URETERAL STENT PLACEMENT;  Surgeon: Kathie Rhodes, MD;  Location: [facility];  Service: Urology;  Laterality: Bilateral;  . CYSTOSCOPY WITH RETROGRADE PYELOGRAM, URETEROSCOPY AND STENT PLACEMENT Bilateral 09/02/2015   Procedure: CYSTOSCOPY BILATERAL STENT REMOVAL BILATERAL RETROGRADE PYELOGRAM, BILATERAL URETEROSCOPY AND BILATERAL STENT PLACEMENT;  Surgeon: Kathie Rhodes, MD;  Location: [facility];  Service: Urology;  Laterality: Bilateral;  . HOLMIUM LASER APPLICATION Bilateral 6/56/8127   Procedure: BILATERAL HOLMIUM LASER  LITHO ;  Surgeon: Kathie Rhodes, MD;  Location: [facility];  Service: Urology;  Laterality: Bilateral;  . JOINT REPLACEMENT Right 2007   hip, Dr. Alvan Dame   . TONSILLECTOMY Bilateral    70 years ago   HPI:  82 year old male admitted 12/21/16 wtih weakness for several months. Pt has been in jail for a year, and has had right side weakness since June. PMH significant for DM, Sz. MRI negative for acute abnormalities. Parkinson's is suspected.   Assessment / Plan / Recommendation Clinical Impression  The Montreal Cognitive Assessment (MoCA) was administered. Pt scored 25/30 (n=26+/30), indicating minimal cognitive impairment, however, this may be at or close to pt baseline. Points were lost for thought organization, delayed recall and visuoperception (pt did not have glasses, which may have adversely affected performance). No acute needs for skilled ST intervention are recommended at this time, however, pt was encouraged to notify his PCP if issues arise once he has been discharged from hospital and returns to his normal routine. RN informed of results. ST signing off at this time. Please reconsult if needs arise.    SLP Assessment  SLP Recommendation/Assessment: All further Speech Lanaguage Pathology  needs can be addressed in the next venue of care SLP Visit Diagnosis: Cognitive communication deficit (R41.841)    Follow Up Recommendations  Home health SLP (if needs arise)          SLP Evaluation Cognition  Overall Cognitive Status: Within Functional Limits for tasks assessed Arousal/Alertness: Awake/alert Orientation Level: Oriented X4 Attention: Focused;Sustained;Selective Focused Attention: Appears intact Sustained Attention: Appears intact Selective Attention: Appears intact Memory: Impaired Memory Impairment: Retrieval deficit;Decreased recall of new information Awareness: Appears intact Problem Solving: Appears intact (simple verbal) Executive Function: Reasoning Reasoning: Appears intact (simple verbal) Safety/Judgment: Appears intact       Comprehension  Auditory  Comprehension Overall Auditory Comprehension: Appears within  functional limits for tasks assessed    Expression Expression Primary Mode of Expression: Verbal Written Expression Dominant Hand: Right   Oral / Motor  Oral Motor/Sensory Function Overall Oral Motor/Sensory Function: Within functional limits Motor Speech Overall Motor Speech: Appears within functional limits for tasks assessed   GO                   Kalana Yust B. Quentin Ore [facility], CCC-SLP Speech Language Pathologist [phone]  Shonna Chock 12/23/2016, 11:11 AM

## 2016-12-23 NOTE — Progress Notes (Signed)
Occupational Therapy Treatment Patient Details Name: [name] [name] MRN: [id number] DOB: 05/19/1934 Today's Date: 12/23/2016    History of present illness  82 y.o. male with medical history significant of DM, seizure disorder who just released from jail on 12/18/16 after being there for a year, comes in for weakness; neurology consulted and feel Pt's weakness may be due to Parkinson disease versus seizures. MRI Brain negative for acute stroke; MRI C-spine has multilvel DJD but no spinal cord signal change    OT comments  Pt progressing towards goals, continues to require MaxA for bed mobility, Pt able to stand at [facility] and takes small side steps along EOB with overall Min-ModA this session, anticipate will require +2 assist for mobility progression. Pt completed grooming ADLs seated EOB with minguard for static sitting balance, continues to require Max-total assist for LB and toileting ADLs. Feel SNF remains appropriate recommendation after discharge; will continue to follow acutely to progress Pt towards established OT goals.   Follow Up Recommendations  SNF;Supervision/Assistance - 24 hour    Equipment Recommendations  Other (comment) (defer to next venue )          Precautions / Restrictions Precautions Precautions: Fall Restrictions Weight Bearing Restrictions: No       Mobility Bed Mobility Overal bed mobility: Needs Assistance Bed Mobility: Rolling;Sidelying to Sit;Sit to Sidelying Rolling: Max assist Sidelying to sit: Max assist     Sit to sidelying: Max assist General bed mobility comments: assist to bring LEs over EOB and heavy assist to bring trunk into upright position, requires assist for LE management when returnting to sidelying/supine  Transfers Overall transfer level: Needs assistance Equipment used: Rolling walker (2 wheeled) Transfers: Sit to/from Stand Sit to Stand: Mod assist         General transfer comment: Assist to rise and steady at RW, Pt took  small shuffling steps along EOB prior to return to sitting; anticipate Pt will continue to require +2 assist for mobility away from EOB     Balance Overall balance assessment: Needs assistance Sitting-balance support: Feet supported Sitting balance-Leahy Scale: Good Sitting balance - Comments: static sitting EOB    Standing balance support: Bilateral upper extremity supported Standing balance-Leahy Scale: Poor Standing balance comment: reliant on UE support                            ADL either performed or assessed with clinical judgement   ADL Overall ADL's : Needs assistance/impaired     Grooming: Set up;Sitting;Min guard;Wash/dry face Grooming Details (indicate cue type and reason): minGuard while sitting EOB             Lower Body Dressing: Total assistance;Bed level;Sit to/from stand;+2 for safety/equipment Lower Body Dressing Details (indicate cue type and reason): total assist for management of socks at bed level      Toileting- Clothing Manipulation and Hygiene: Total assistance;Bed level;+2 for physical assistance;+2 for safety/equipment Toileting - Clothing Manipulation Details (indicate cue type and reason): Pt declining using BSC, requesting to use bed pan, requires MaxA for bed mobility and total assist for pericare after BM      Functional mobility during ADLs: Maximal assistance;+2 for safety/equipment;Rolling walker;Moderate assistance General ADL Comments: Pt requires heavy MaxA for bed mobility, able to maintain static sitting EOB for grooming ADLs with minguard and setup assist; ModA to stand at RW with MinA to take small shuffling steps along EOB prior to return to sitting, will require +  2 assist for further mobility away from EOB                       Cognition Arousal/Alertness: Awake/alert Behavior During Therapy: WFL for tasks assessed/performed Overall Cognitive Status: Impaired/Different from baseline Area of Impairment: Memory                      Memory: Decreased short-term memory         General Comments: Pt verbalizes he recalls therapist from previous session, however during session proceeds to tell therapist of recent events which had been discussed in previous session the day before                          Pertinent Vitals/ Pain       Pain Assessment: Faces Pain Score: 3  Faces Pain Scale: Hurts a little bit Pain Location: buttocks Pain Descriptors / Indicators: Sore Pain Intervention(s): Monitored during session;Limited activity within patient's tolerance;Repositioned  Home Living     Available Help at Discharge: Friend(s) Type of Home:  (has been in prison for the last year)                              Lives With: Significant other                  Frequency  Min 2X/week        Progress Toward Goals  OT Goals(current goals can now be found in the care plan section)  Progress towards OT goals: Progressing toward goals  Acute Rehab OT Goals Patient Stated Goal: increase independence, go home OT Goal Formulation: With patient Time For Goal Achievement: 01/05/17 Potential to Achieve Goals: Good  Plan Discharge plan remains appropriate                     AM-PAC PT "6 Clicks" Daily Activity     Outcome Measure   Help from another person eating meals?: A Little Help from another person taking care of personal grooming?: A Little Help from another person toileting, which includes using toliet, bedpan, or urinal?: Total Help from another person bathing (including washing, rinsing, drying)?: A Lot Help from another person to put on and taking off regular upper body clothing?: A Lot Help from another person to put on and taking off regular lower body clothing?: Total 6 Click Score: 12    End of Session Equipment Utilized During Treatment: Rolling walker  OT Visit Diagnosis: Unsteadiness on feet (R26.81);Other abnormalities of gait and  mobility (R26.89);Muscle weakness (generalized) (M62.81)   Activity Tolerance Patient tolerated treatment well   Patient Left in bed;with call bell/phone within reach;with bed alarm set   Nurse Communication Mobility status        Time: 5009-3818 OT Time Calculation (min): 38 min  Charges: OT General Charges $OT Visit: 1 Visit OT Treatments $Self Care/Home Management : 23-37 mins  Lou Cal, OT Pager [phone] 12/23/2016    Raymondo Band 12/23/2016, 1:39 PM

## 2016-12-23 NOTE — Progress Notes (Signed)
[facility] Progress Note  Patient: [name] [name] FWY:[id number]   PCP: Gildardo Cranker, DO DOB: 17-Apr-1934   DOA: 12/21/2016   DOS: 12/23/2016   Date of Service: the patient was seen and examined on 12/23/2016  Subjective: Feeling better, no nausea no vomiting. Still has tremors. No abdominal pain. Unchanged weakness.  Brief hospital course: Pt. with PMH of type II DM, seizure; admitted on 12/21/2016, presented with complaint of weakness, was found to have UTI with Parkinson's disease. Patient reportedly wheelchair-bound at his baseline, had stroke and seizures. Recently released from jail. Currently further plan is continue IV fluids.  Assessment and Plan: 1. UTI due to Escherichia coli. Generalized weakness. Cultures come back positive for urine for multidrug-resistant organism sensitive to ceftriaxone, Primaxin and Macrobid. Clinically patient responding to IV ceftriaxone, will continue the same for now. Patient chronically incontinent at his baseline and also uses intermittent catheterization. Monitor for now. For outpatient urology follow-up.  2. History of seizures. History of CVA. Worsening of chronic generalized weakness. Parkinson's disease. Neurology was consulted, EEG negative. MRI brain no acute stroke, multilevel cervical degeneration on MRI C-spine. No acute abnormality Stroke workup was completed with echocardiogram, shows preserved EF and diastolic dysfunction as well as carotid Doppler which showed 60-79% stenosis in the left ICA Neurology currently signed off. Outpatient vascular surgery follow-up recommended. Continue Keppra for seizures. Outpatient orthopedic referral for degenerative disc disease of his spine. Also outpatient neurology referral. No driving until cleared by neurology as an outpatient.  3. Essential hypertension. Frequent PVCs. Hypotension. Blood pressure relatively running on the softer side, we will be giving increase IV fluid. Stopping  blood pressure medication. Has frequent PVCs on telemetry, asymptomatic. Monitor.  4. CKD III versus acute kidney injury. Chronic incontinence. Patient reports chronic incontinence, will get ultrasound renal as he apparently has acute kidney injury present on day of admission. Urine output 3.2 L in the last 24 hours. Monitor With UTI, would prefer patient having outpatient urology follow-up. Patient was also on lisinopril hydrochlorothiazide at home which is currently on hold. Also on metformin at home currently code. Flomax is on hold due to hypotension.  5. Type 2 diabetes mellitus. Abdomen A1c was 8.0 and 6 2017. It is 5.7 on this admission, sugars are also well controlled. Monitor. Currently sliding scale sensitive.  Diet: Cardiac modified diet DVT Prophylaxis: subcutaneous Heparin  Advance goals of care discussion: full code  Family Communication: no family was present at bedside, at the time of interview.   Disposition:  Discharge to SNF.  Consultants: neurology Procedures: Echocardiogram, EEG  Antibiotics: Anti-infectives    Start     Dose/Rate Route Frequency Ordered Stop   12/22/16 2030  cefTRIAXone (ROCEPHIN) 1 g in dextrose 5 % 50 mL IVPB     1 g 100 mL/hr over 30 Minutes Intravenous Every 24 hours 12/21/16 2129     12/21/16 2015  cefTRIAXone (ROCEPHIN) 1 g in dextrose 5 % 50 mL IVPB     1 g 100 mL/hr over 30 Minutes Intravenous  Once 12/21/16 2002 12/21/16 2102       Objective: Physical Exam: Vitals:   12/23/16 0515 12/23/16 0521 12/23/16 1038 12/23/16 1340  BP: (!) 96/32 (!) 96/54 118/60 (!) 154/73  Pulse: (!) 56  60 65  Resp: 18  18 20   Temp: (!) 100.4 F (38 C)  98.2 F (36.8 C) 98.7 F (37.1 C)  TempSrc: Oral  Oral Oral  SpO2: 95%  94% 93%  Weight:  Height:        Intake/Output Summary (Last 24 hours) at 12/23/16 1543 Last data filed at 12/23/16 1342  Gross per 24 hour  Intake           696.25 ml  Output             3065 ml  Net          -2368.75 ml   Filed Weights   12/21/16 2158  Weight: 96 kg (211 lb 11.2 oz)   General: Alert, Awake and Oriented to Time, Place and Person. Appear in mild distress, affect appropriate Eyes: PERRL, Conjunctiva normal ENT: Oral Mucosa clear moist. Neck: no JVD, no Abnormal Mass Or lumps Cardiovascular: S1 and S2 Present, no Murmur, Peripheral Pulses Present Respiratory: normal respiratory effort, Bilateral Air entry equal and Decreased, no use of accessory muscle, Clear to Auscultation, no Crackles, no wheezes Abdomen: Bowel Sound present, Soft and no tenderness, no hernia Skin: no redness, no Rash, no induration Extremities: no Pedal edema, no calf tenderness Neurologic: Grossly no focal neuro deficit. Bilaterally Equal motor strength  Data Reviewed: CBC:  Recent Labs Lab 12/21/16 1730 12/22/16 0830 12/23/16 0401  WBC 8.2 5.9 6.5  HGB 11.6* 10.8* 10.3*  HCT 34.8* 33.4* 31.6*  MCV 94.1 95.2 94.0  PLT 156 142* 182   Basic Metabolic Panel:  Recent Labs Lab 12/21/16 1730 12/22/16 0830 12/23/16 0401  NA 138 141 141  K 4.3 3.6 3.8  CL 102 106 108  CO2 25 26 24   GLUCOSE 152* 110* 118*  BUN 40* 35* 31*  CREATININE 1.65* 1.61* 1.53*  CALCIUM 9.0 8.7* 8.3*  MG  --   --  1.7    Liver Function Tests:  Recent Labs Lab 12/21/16 1730  AST 33  ALT 20  ALKPHOS 85  BILITOT 1.0  PROT 6.1*  ALBUMIN 3.1*   No results for input(s): LIPASE, AMYLASE in the last 168 hours. No results for input(s): AMMONIA in the last 168 hours. Coagulation Profile: No results for input(s): INR, PROTIME in the last 168 hours. Cardiac Enzymes: No results for input(s): CKTOTAL, CKMB, CKMBINDEX, TROPONINI in the last 168 hours. BNP (last 3 results) No results for input(s): PROBNP in the last 8760 hours. CBG:  Recent Labs Lab 12/22/16 1201 12/22/16 1716 12/22/16 2137 12/23/16 0802 12/23/16 1154  GLUCAP 147* 172* 178* 102* 102*   Studies: No results found.  Scheduled Meds: .  aspirin  300 mg Rectal Daily   Or  . aspirin  325 mg Oral Daily  . insulin aspart  0-9 Units Subcutaneous TID WC  . levETIRAcetam  500 mg Oral BID   Continuous Infusions: . sodium chloride 125 mL/hr at 12/23/16 1255  . cefTRIAXone (ROCEPHIN)  IV Stopped (12/22/16 2105)   PRN Meds: acetaminophen **OR** acetaminophen (TYLENOL) oral liquid 160 mg/5 mL **OR** acetaminophen  Time spent: 17minutes  Author: Berle Mull, MD [facility] Pager: [phone] 12/23/2016 3:43 PM  If 7PM-7AM, please contact night-coverage at [email or web address], password [facility]

## 2016-12-24 LAB — GLUCOSE, CAPILLARY
GLUCOSE-CAPILLARY: 91 mg/dL (ref 65–99)
GLUCOSE-CAPILLARY: 99 mg/dL (ref 65–99)
Glucose-Capillary: 177 mg/dL — ABNORMAL HIGH (ref 65–99)
Glucose-Capillary: 166 mg/dL — ABNORMAL HIGH (ref 65–99)

## 2016-12-24 LAB — CBC
HEMATOCRIT: 33.2 % — AB (ref 39.0–52.0)
HEMOGLOBIN: 10.8 g/dL — AB (ref 13.0–17.0)
MCH: 30.6 pg (ref 26.0–34.0)
MCHC: 32.5 g/dL (ref 30.0–36.0)
MCV: 94.1 fL (ref 78.0–100.0)
Platelets: 148 10*3/uL — ABNORMAL LOW (ref 150–400)
RBC: 3.53 MIL/uL — AB (ref 4.22–5.81)
RDW: 14.9 % (ref 11.5–15.5)
WBC: 6 10*3/uL (ref 4.0–10.5)

## 2016-12-24 LAB — BASIC METABOLIC PANEL
ANION GAP: 7 (ref 5–15)
BUN: 23 mg/dL — ABNORMAL HIGH (ref 6–20)
CALCIUM: 8.3 mg/dL — AB (ref 8.9–10.3)
CHLORIDE: 109 mmol/L (ref 101–111)
CO2: 23 mmol/L (ref 22–32)
Creatinine, Ser: 1.35 mg/dL — ABNORMAL HIGH (ref 0.61–1.24)
GFR calc non Af Amer: 48 mL/min — ABNORMAL LOW (ref >60)
GFR, EST AFRICAN AMERICAN: 56 mL/min — AB (ref >60)
Glucose, Bld: 94 mg/dL (ref 65–99)
Potassium: 3.9 mmol/L (ref 3.5–5.1)
Sodium: 139 mmol/L (ref 135–145)

## 2016-12-24 LAB — MAGNESIUM: Magnesium: 1.6 mg/dL — ABNORMAL LOW (ref 1.7–2.4)

## 2016-12-24 MED ORDER — MAGNESIUM SULFATE 2 GM/50ML IV SOLN
2.0000 g | Freq: Once | INTRAVENOUS | Status: AC
  Administered 2016-12-24: 2 g via INTRAVENOUS
  Filled 2016-12-24: qty 50

## 2016-12-24 MED ORDER — CEFPODOXIME PROXETIL 200 MG PO TABS
200.0000 mg | ORAL_TABLET | Freq: Two times a day (BID) | ORAL | Status: DC
  Administered 2016-12-24 – 2016-12-26 (×5): 200 mg via ORAL
  Filled 2016-12-24 (×6): qty 1

## 2016-12-24 MED ORDER — SIMVASTATIN 20 MG PO TABS
20.0000 mg | ORAL_TABLET | Freq: Every day | ORAL | Status: DC
  Administered 2016-12-24 – 2016-12-25 (×2): 20 mg via ORAL
  Filled 2016-12-24 (×2): qty 1

## 2016-12-24 MED ORDER — TAMSULOSIN HCL 0.4 MG PO CAPS
0.4000 mg | ORAL_CAPSULE | Freq: Every day | ORAL | Status: DC
  Administered 2016-12-24 – 2016-12-26 (×3): 0.4 mg via ORAL
  Filled 2016-12-24 (×3): qty 1

## 2016-12-24 NOTE — Progress Notes (Signed)
[facility] Progress Note  Patient: [name] [name]:[id number]   PCP: Gildardo Cranker, DO DOB: 31-Mar-1934   DOA: 12/21/2016   DOS: 12/24/2016   Date of Service: the patient was seen and examined on 12/24/2016  Subjective: Feeling better, no nausea no vomiting. Weakness is also getting better.  Brief hospital course: Pt. with PMH of type II DM, seizure; admitted on 12/21/2016, presented with complaint of weakness, was found to have UTI with Parkinson's disease. Patient reportedly wheelchair-bound at his baseline, had stroke and seizures. Recently released from jail. Currently further plan is continue IV fluids.  Assessment and Plan: 1. UTI due to Escherichia coli. Generalized weakness. Cultures come back positive for urine for multidrug-resistant organism sensitive to ceftriaxone, Primaxin and Macrobid. Clinically patient responding to IV ceftriaxone, changed to Vantin. Patient chronically incontinent at his baseline and also uses intermittent catheterization. Monitor for now. For outpatient urology follow-up.  2. History of seizures. History of CVA. Worsening of chronic generalized weakness. Parkinson's disease. Neurology was consulted, EEG negative. MRI brain no acute stroke, multilevel cervical degeneration on MRI C-spine. No acute abnormality Stroke workup was completed with echocardiogram, shows preserved EF and diastolic dysfunction as well as carotid Doppler which showed 60-79% stenosis in the left ICA Neurology currently signed off. Outpatient vascular surgery follow-up recommended. Continue Keppra for seizures. Outpatient orthopedic referral for degenerative disc disease of his spine. Also outpatient neurology referral. No driving until cleared by neurology as an outpatient.  3. Essential hypertension. Frequent PVCs. Hypotension. Blood pressure relatively running on the softer side, we will be giving increase IV fluid. Stopping blood pressure medication. Has  frequent PVCs on telemetry, asymptomatic. Monitor.  4. CKD III versus acute kidney injury. Chronic incontinence. Patient reports chronic incontinence, will get ultrasound renal as he apparently has acute kidney injury present on day of admission. Urine output 3.2 L in the last 24 hours. Monitor With UTI, would prefer patient having outpatient urology follow-up. Patient was also on lisinopril hydrochlorothiazide at home which is currently on hold. Resume Flomax. Ultrasound renal shows no evidence of hydronephrosis, chronic nephrolithiasis without any obstruction.  5. Type 2 diabetes mellitus. Abdomen A1c was 8.0 and 6 2017. It is 5.7 on this admission, sugars are also well controlled. Monitor. Currently sliding scale sensitive.  Diet: Cardiac modified diet DVT Prophylaxis: subcutaneous Heparin  Advance goals of care discussion: full code  Family Communication: no family was present at bedside, at the time of interview.   Disposition:  Discharge to SNF tomorrow.  Consultants: neurology Procedures: Echocardiogram, EEG  Antibiotics: Anti-infectives    Start     Dose/Rate Route Frequency Ordered Stop   12/24/16 1300  cefpodoxime (VANTIN) tablet 200 mg     200 mg Oral Every 12 hours 12/24/16 1213     12/22/16 2030  cefTRIAXone (ROCEPHIN) 1 g in dextrose 5 % 50 mL IVPB  Status:  Discontinued     1 g 100 mL/hr over 30 Minutes Intravenous Every 24 hours 12/21/16 2129 12/24/16 1211   12/21/16 2015  cefTRIAXone (ROCEPHIN) 1 g in dextrose 5 % 50 mL IVPB     1 g 100 mL/hr over 30 Minutes Intravenous  Once 12/21/16 2002 12/21/16 2102       Objective: Physical Exam: Vitals:   12/24/16 0136 12/24/16 0550 12/24/16 1011 12/24/16 1335  BP: (!) 121/50 (!) 138/50 (!) 151/53 (!) 155/72  Pulse: 85 64 65 (!) 43  Resp: 19 19 20 20   Temp: 98.6 F (37 C) 99.1 F (37.3 C)  98.2 F (36.8 C) 98.5 F (36.9 C)  TempSrc: Oral Oral Oral Oral  SpO2: 92% 95% 95% 94%  Weight:      Height:         Intake/Output Summary (Last 24 hours) at 12/24/16 1828 Last data filed at 12/24/16 1701  Gross per 24 hour  Intake              720 ml  Output             2600 ml  Net            -1880 ml   Filed Weights   12/21/16 2158  Weight: 96 kg (211 lb 11.2 oz)   General: Alert, Awake and Oriented to Time, Place and Person. Appear in mild distress, affect appropriate Eyes: PERRL, Conjunctiva normal ENT: Oral Mucosa clear moist. Neck: no JVD, no Abnormal Mass Or lumps Cardiovascular: S1 and S2 Present, no Murmur, Peripheral Pulses Present Respiratory: normal respiratory effort, Bilateral Air entry equal and Decreased, no use of accessory muscle, Clear to Auscultation, no Crackles, no wheezes Abdomen: Bowel Sound present, Soft and no tenderness, no hernia Skin: no redness, no Rash, no induration Extremities: no Pedal edema, no calf tenderness Neurologic: Grossly no focal neuro deficit. Bilaterally Equal motor strength  Data Reviewed: CBC:  Recent Labs Lab 12/21/16 1730 12/22/16 0830 12/23/16 0401 12/24/16 0705  WBC 8.2 5.9 6.5 6.0  HGB 11.6* 10.8* 10.3* 10.8*  HCT 34.8* 33.4* 31.6* 33.2*  MCV 94.1 95.2 94.0 94.1  PLT 156 142* 151 374*   Basic Metabolic Panel:  Recent Labs Lab 12/21/16 1730 12/22/16 0830 12/23/16 0401 12/24/16 0705  NA 138 141 141 139  K 4.3 3.6 3.8 3.9  CL 102 106 108 109  CO2 25 26 24 23   GLUCOSE 152* 110* 118* 94  BUN 40* 35* 31* 23*  CREATININE 1.65* 1.61* 1.53* 1.35*  CALCIUM 9.0 8.7* 8.3* 8.3*  MG  --   --  1.7 1.6*    Liver Function Tests:  Recent Labs Lab 12/21/16 1730  AST 33  ALT 20  ALKPHOS 85  BILITOT 1.0  PROT 6.1*  ALBUMIN 3.1*   No results for input(s): LIPASE, AMYLASE in the last 168 hours. No results for input(s): AMMONIA in the last 168 hours. Coagulation Profile: No results for input(s): INR, PROTIME in the last 168 hours. Cardiac Enzymes: No results for input(s): CKTOTAL, CKMB, CKMBINDEX, TROPONINI in the last 168  hours. BNP (last 3 results) No results for input(s): PROBNP in the last 8760 hours. CBG:  Recent Labs Lab 12/23/16 1645 12/23/16 2117 12/24/16 0746 12/24/16 1214 12/24/16 1656  GLUCAP 122* 184* 91 99 177*   Studies: No results found.  Scheduled Meds: . aspirin  300 mg Rectal Daily   Or  . aspirin  325 mg Oral Daily  . cefpodoxime  200 mg Oral Q12H  . insulin aspart  0-9 Units Subcutaneous TID WC  . levETIRAcetam  500 mg Oral BID  . simvastatin  20 mg Oral q1800  . tamsulosin  0.4 mg Oral Daily   Continuous Infusions:  PRN Meds: acetaminophen **OR** acetaminophen (TYLENOL) oral liquid 160 mg/5 mL **OR** acetaminophen  Time spent: 9minutes  Author: Berle Mull, MD [facility] Pager: [phone] 12/24/2016 6:28 PM  If 7PM-7AM, please contact night-coverage at [email or web address], password [facility]

## 2016-12-24 NOTE — Progress Notes (Signed)
Physical Therapy Treatment Patient Details Name: [name] [name] MRN: [id number] DOB: 01/29/1935 Today's Date: 12/24/2016    History of Present Illness  82 y.o. male with medical history significant of DM, seizure disorder who just released from jail on 12/18/16 after being there for a year, comes in for weakness; neurology consulted and feel Pt's weakness may be due to Parkinson disease versus seizures. MRI Brain negative for acute stroke; MRI C-spine has multilvel DJD but no spinal cord signal change     PT Comments    Pt is making progress toward his goals. Pt currently modAx2 for bringing trunk to upright with bed mobility and minAx2 for powerup and taking 6 sidesteps to recliner. Lunch arrived during treatment session and pt anxious to eat so additional therex suspended until next session. Pt requires skilled PT to progress mobility and improve strength and endurance to safely navigate their discharge environment.    Follow Up Recommendations  SNF     Equipment Recommendations  Other (comment) (TBA)    Recommendations for Other Services       Precautions / Restrictions Precautions Precautions: Fall Restrictions Weight Bearing Restrictions: No    Mobility  Bed Mobility Overal bed mobility: Needs Assistance Bed Mobility: Rolling;Supine to Sit     Supine to sit: Mod assist;+2 for physical assistance     General bed mobility comments: assist to bring LEs over EOB and  modAx2 to bring trunk into upright position; Pt able to scoot self along EOB using UEs prior to return to supine, requires assist for LE management when returnting to sidelying/supine  Transfers Overall transfer level: Needs assistance Equipment used: Rolling walker (2 wheeled) Transfers: Sit to/from Stand Sit to Stand: Min assist;+2 physical assistance;From elevated surface         General transfer comment: minA for power up into standing and steadying  Ambulation/Gait Ambulation/Gait assistance: Min  assist;+2 safety/equipment Ambulation Distance (Feet): 2 Feet Assistive device: Rolling walker (2 wheeled) Gait Pattern/deviations: Step-to pattern;Decreased step length - right;Decreased step length - left;Shuffle Gait velocity: slowed Gait velocity interpretation: Below normal speed for age/gender General Gait Details: minAx2 for steading as pt took 6 sidesteps to pivot in front of recliner,       Balance Overall balance assessment: Needs assistance Sitting-balance support: Feet supported Sitting balance-Leahy Scale: Good Sitting balance - Comments: static sitting EOB    Standing balance support: Bilateral upper extremity supported Standing balance-Leahy Scale: Poor Standing balance comment: reliant on UE support                             Cognition Arousal/Alertness: Awake/alert Behavior During Therapy: WFL for tasks assessed/performed Overall Cognitive Status: Within Functional Limits for tasks assessed                                           General Comments General comments (skin integrity, edema, etc.): VSS throughout session      Pertinent Vitals/Pain Pain Assessment: Faces Faces Pain Scale: Hurts a little bit Pain Location: knee, bil shoulders (both chronic injuries)  Pain Descriptors / Indicators: Sore Pain Intervention(s): Monitored during session;Limited activity within patient's tolerance           PT Goals (current goals can now be found in the care plan section) Acute Rehab PT Goals Patient Stated Goal: increase independence, go home PT Goal  Formulation: With patient Time For Goal Achievement: 01/05/17 Potential to Achieve Goals: Fair Progress towards PT goals: Progressing toward goals    Frequency    Min 3X/week      PT Plan Current plan remains appropriate       AM-PAC PT "6 Clicks" Daily Activity  Outcome Measure  Difficulty turning over in bed (including adjusting bedclothes, sheets and blankets)?:  Unable Difficulty moving from lying on back to sitting on the side of the bed? : Unable Difficulty sitting down on and standing up from a chair with arms (e.g., wheelchair, bedside commode, etc,.)?: Unable Help needed moving to and from a bed to chair (including a wheelchair)?: A Lot Help needed walking in hospital room?: A Lot Help needed climbing 3-5 steps with a railing? : A Lot 6 Click Score: 9    End of Session Equipment Utilized During Treatment: Gait belt Activity Tolerance: Patient tolerated treatment well;Patient limited by pain Patient left: in bed;in chair;with chair alarm set;with call bell/phone within reach Nurse Communication: Mobility status PT Visit Diagnosis: Unsteadiness on feet (R26.81);Other abnormalities of gait and mobility (R26.89);Muscle weakness (generalized) (M62.81);Pain Pain - Right/Left:  (both) Pain - part of body: Leg;Shoulder     Time: 3893-7342 PT Time Calculation (min) (ACUTE ONLY): 17 min  Charges:  $Therapeutic Activity: 8-22 mins                    G Codes:  Functional Assessment Tool Used: AM-PAC 6 Clicks Basic Mobility;Clinical judgement Functional Limitation: Mobility: Walking and moving around Mobility: Walking and Moving Around Current Status (A7681): At least 20 percent but less than 40 percent impaired, limited or restricted Mobility: Walking and Moving Around Goal Status [phone]): At least 1 percent but less than 20 percent impaired, limited or restricted    Benjamine Mola B. Migdalia Dk PT, DPT Acute Rehabilitation  [phone] Pager [phone]     [place] 12/24/2016, 3:56 PM

## 2016-12-24 NOTE — Consult Note (Signed)
           [facility] CM Primary Care Navigator  12/24/2016  BRANDY KABAT Mar 18, 1934 [id number]     Seen patient at the bedside to identify possible discharge needs. Patient reports having weakness and difficulty to walk related to Parkinson's disease thathadled to this admission. Patient mentioned that he was recently released from jail for a year of incarceration.  Patient endorses Gildardo Cranker, DO with [facility] as hisprimary care provider.   Patient shared using [facility] on [address] and [facility] on [address] obtain medications without difficulty.   Patient states managinghisown medications at home straight out of the containers.   Patient reports that stepson Marden Noble) provides transportationto hisdoctors'appointments.  Patient states that he lives at home with his common-law wife (who is blind) and has 2 sons living with them who can provide assistance when needed.  Anticipated discharge plan is skilled nursing facility (SNF) for rehabilitation per therapy recommendation.  Patient voiced understanding to call primary care provider's officewhen hereturns backhome,for a post discharge follow-up within a week or sooner if needs arise.Patient letter (with PCP's contact number) was provided as areminder.   Discussed with patient regarding [facility] CM services available for health management at home and he expressed understanding to seekreferral from primary care provider to [facility] care management ifdeemed necessary and appropriatefor services in the [place] he returns back home.  [facility] care management information was provided for future needs that may arise.  For questions, please contact:  Dannielle Huh, BSN, RN- [facility] Primary Care Navigator  Telephone: [phone] [place]

## 2016-12-24 NOTE — Care Management Note (Addendum)
Case Management Note  Patient Details  Name: RADEN BYINGTON MRN: [id number] Date of Birth: [birth year]  Subjective/Objective:        Admitted on 12/21/2016, presented with complaint of weakness, was found to have UTI with Parkinson's disease, Hx of  DM, seizure.  Pt resides with girlfriend [name] who is blind. Pt states [name] has 2 sons that works during the day however is supportive if needed after work to assist them. PTA ( x 1 week) pt was recently released from jail. States independent with ADL's, no DME usage.  Miguel Rota ([place])     [phone]       PCP: Gildardo Cranker  Action/Plan: Recommendations from PT/OT: SNF placement. CSW is aware. Pt can be difficult to place 2/2 jail offense per CSW. CM will continue to follow for disposition needs.     Expected Discharge Date:                  Expected Discharge Plan:  Skilled Nursing Facility  In-House Referral:  Clinical Social Work  Discharge planning Services  CM Consult  Post Acute Care Choice:   patient Choice offered to:     DME Arranged:    DME Agency:     HH Arranged:    [place], referral made pending MD's orders.  Status of Service:  In process, will continue to follow  If discussed at Long Length of Stay Meetings, dates discussed:    Additional Comments:  Sharin Mons, RN 12/24/2016, 1:30 PM

## 2016-12-25 LAB — CBC
HEMATOCRIT: 34.3 % — AB (ref 39.0–52.0)
Hemoglobin: 11.5 g/dL — ABNORMAL LOW (ref 13.0–17.0)
MCH: 31.1 pg (ref 26.0–34.0)
MCHC: 33.5 g/dL (ref 30.0–36.0)
MCV: 92.7 fL (ref 78.0–100.0)
Platelets: 172 10*3/uL (ref 150–400)
RBC: 3.7 MIL/uL — AB (ref 4.22–5.81)
RDW: 14.5 % (ref 11.5–15.5)
WBC: 5.8 10*3/uL (ref 4.0–10.5)

## 2016-12-25 LAB — GLUCOSE, CAPILLARY
GLUCOSE-CAPILLARY: 104 mg/dL — AB (ref 65–99)
GLUCOSE-CAPILLARY: 227 mg/dL — AB (ref 65–99)
Glucose-Capillary: 115 mg/dL — ABNORMAL HIGH (ref 65–99)
Glucose-Capillary: 113 mg/dL — ABNORMAL HIGH (ref 65–99)

## 2016-12-25 LAB — BASIC METABOLIC PANEL
ANION GAP: 9 (ref 5–15)
BUN: 22 mg/dL — ABNORMAL HIGH (ref 6–20)
CO2: 21 mmol/L — AB (ref 22–32)
Calcium: 8.5 mg/dL — ABNORMAL LOW (ref 8.9–10.3)
Chloride: 108 mmol/L (ref 101–111)
Creatinine, Ser: 1.31 mg/dL — ABNORMAL HIGH (ref 0.61–1.24)
GFR calc Af Amer: 58 mL/min — ABNORMAL LOW (ref >60)
GFR calc non Af Amer: 50 mL/min — ABNORMAL LOW (ref >60)
Glucose, Bld: 111 mg/dL — ABNORMAL HIGH (ref 65–99)
POTASSIUM: 3.8 mmol/L (ref 3.5–5.1)
Sodium: 138 mmol/L (ref 135–145)

## 2016-12-25 MED ORDER — CEFPODOXIME PROXETIL 200 MG PO TABS: 200.0000 mg | ORAL_TABLET | Freq: Two times a day (BID) | ORAL | 0 refills | Status: AC

## 2016-12-25 NOTE — Discharge Summary (Deleted)
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 This follow-up with PCP in one week. 2. Patient is to establish care with neurology for follow-up on Parkinson's disease as well as seizure history, urology for follow-up on urinary incontinence and UTI, neurosurgery for follow-up on C-spine stenosis  Follow-up Information    Gildardo Cranker, DO. Schedule an appointment as soon as possible for a visit in 1 week(s).   Specialty:  Internal Medicine Contact information: [place] [phone]        [facility]. Schedule an appointment as soon as possible for a visit in 1 month(s).   Contact information: [address]     [place] [phone] [place], [facility]. Schedule an appointment as soon as possible for a visit in 1 month(s).   Contact information: [place] [address] [phone]        Pa, Maria Antonia. Schedule an appointment as soon as possible for a visit in 1 month(s).   Specialty:  Neurosurgery Contact information: [address] [place] [address] [phone]          Diet recommendation: Cardiac modified diet  Activity: The patient is advised to gradually reintroduce usual activities.  Discharge Condition: good  Code Status: Full code  History of present illness: As  per the H and P dictated on admission, " [name] [name] is a 82 y.o. male with medical history significant of DM, seizure disorder who just released from jail Friday (today is Monday) after being there for a year comes in for weakness.  Pt reports he has been wheelchair bound for months there and was told he had a stroke.  He has gotten minimal therapy.  Was not hospitalized per his report and officially diagnosed with a cva.  Denies fevers.  He is chronically incontinent of urine.  He wears a depends.  Pt is staying with a friend of his who is blind.  He comes in due to his weakness for months and ct showing likely new infarct.  Pt reports he has had more difficulty with using his right hand, arm and his right fingers are contracted.  Pt referred for admission for possible stroke"  Hospital Course:  Summary of his active problems in the hospital is as following. 1. UTI due to Escherichia coli. Generalized weakness. Cultures come back positive for urine for multidrug-resistant organism sensitive to ceftriaxone, Primaxin and Macrobid. Clinically patient responding to IV ceftriaxone, changed to Vantin. Patient chronically incontinent at his baseline and also wants to use intermittent catheterization. For outpatient urology follow-up.  2. History of seizures. History of CVA. Worsening of chronic generalized weakness. Parkinson's disease. Neurology was consulted, EEG negative. MRI brain no acute stroke, multilevel cervical degeneration on MRI C-spine. No acute abnormality Stroke workup was completed with echocardiogram, shows preserved EF and diastolic  dysfunction as well as carotid Doppler which showed 60-79% stenosis in the left ICA Neurology currently signed off. Outpatient vascular surgery follow-up recommended. Continue Keppra for seizures. Outpatient neurosurgery and referral for degenerative disc disease of his spine. Also outpatient neurology referral. No driving until cleared by neurology  as an outpatient.  3. Essential hypertension. Frequent PVCs. Hypotension. Blood pressure improved with IV hydration Stopping blood pressure medication. Has frequent PVCs on telemetry, asymptomatic. Monitor.  4. CKD III versus acute kidney injury. Chronic incontinence. Patient reports chronic incontinence, will get ultrasound renal as he apparently has acute kidney injury present on day of admission. Urine output 3.2 L in the last 24 hours. Monitor With UTI, would prefer patient having outpatient urology follow-up. Patient was also on lisinopril hydrochlorothiazide at home which is currently on hold. Resume Flomax. Ultrasound renal shows no evidence of hydronephrosis, chronic nephrolithiasis without any obstruction.  5. Type 2 diabetes mellitus. Abdomen A1c was 8.0 and 6 2017. It is 5.7 on this admission, sugars are also well controlled. Monitor. Currently sliding scale sensitive.   All other chronic medical condition were stable during the hospitalization.  Patient was seen by physical therapy, who recommended SNF, social worker wasn't able to arrange SNF, home health was was arranged by Education officer, museum and case Freight forwarder. On the day of the discharge the patient's vitals were stable, and no other acute medical condition were reported by patient. the patient was felt safe to be discharge at home with home health.  Procedures and Results:  Echocardiogram    Consultations:  Neurology   DISCHARGE MEDICATION: Current Discharge Medication List    START taking these medications   Details  cefpodoxime (VANTIN) 200 MG tablet Take 1 tablet (200 mg total) by mouth 2 (two) times daily. Qty: 2 tablet, Refills: 0      CONTINUE these medications which have NOT CHANGED   Details  levETIRAcetam (KEPPRA) 500 MG tablet Take 1 tablet (500 mg total) by mouth 2 (two) times daily. Qty: 60 tablet, Refills: 0    tamsulosin (FLOMAX) 0.4 MG CAPS capsule Take 1 capsule (0.4 mg total) by mouth  daily after supper. Qty: 30 capsule, Refills: 0    metFORMIN (GLUCOPHAGE) 1000 MG tablet Take 1,000 mg by mouth 2 (two) times daily with a meal.    simvastatin (ZOCOR) 20 MG tablet Take 20 mg by mouth daily.     sitaGLIPtin (JANUVIA) 50 MG tablet Take 1 tablet (50 mg total) by mouth daily. Qty: 90 tablet, Refills: 3      STOP taking these medications     lisinopril-hydrochlorothiazide (PRINZIDE,ZESTORETIC) 20-12.5 MG tablet      loperamide (IMODIUM A-D) 2 MG tablet      HYDROcodone-acetaminophen (NORCO) 10-325 MG tablet      Tdap (BOOSTRIX) 5-2.5-18.5 LF-MCG/0.5 injection      Zoster Vaccine Live, PF, (ZOSTAVAX) 67341 UNT/0.65ML injection        No Known Allergies Discharge Instructions    Ambulatory referral to Neurology    Complete by:  As directed    An appointment is requested in approximately: 4 weeks   Ambulatory referral to Neurosurgery    Complete by:  As directed    Ambulatory referral to Urology    Complete by:  As directed    Diet - low sodium heart healthy    Complete by:  As directed    Discharge instructions    Complete by:  As directed    It is important that you read following instructions as  well as go over your medication list with RN to help you understand your care after this hospitalization.  Discharge Instructions: Please follow-up with PCP in one week  Please request your primary care physician to go over all Hospital Tests and Procedure/Radiological results at the follow up,  Please get all Hospital records sent to your PCP by signing hospital release before you go home.   Do not drive, operating heavy machinery, perform activities at heights, swimming or participation in water activities or provide baby sitting services; until you have been seen by Primary Care Physician or a Neurologist and advised to do so again. Do not take more than prescribed Pain, Sleep and Anxiety Medications. You were cared for by a hospitalist during your hospital  stay. If you have any questions about your discharge medications or the care you received while you were in the hospital after you are discharged, you can call the unit and ask to speak with the hospitalist on call if the hospitalist that took care of you is not available.  Once you are discharged, your primary care physician will handle any further medical issues. Please note that NO REFILLS for any discharge medications will be authorized once you are discharged, as it is imperative that you return to your primary care physician (or establish a relationship with a primary care physician if you do not have one) for your aftercare needs so that they can reassess your need for medications and monitor your lab values. You Must read complete instructions/literature along with all the possible adverse reactions/side effects for all the Medicines you take and that have been prescribed to you. Take any new Medicines after you have completely understood and accept all the possible adverse reactions/side effects. Wear Seat belts while driving. If you have smoked or chewed Tobacco in the last 2 yrs please stop smoking and/or stop any Recreational drug use.   Driving Restrictions    Complete by:  As directed    DO NOT [place]   Increase activity slowly    Complete by:  As directed    Other Restrictions    Complete by:  As directed    Per [facility] statutes, patients with seizures are not allowed to drive until they have been seizure-free for six months.   Use caution when using heavy equipment or power tools. Avoid working on ladders or at heights. Take showers instead of baths. Ensure the water temperature is not too high on the home water heater. Do not go swimming alone. Do not lock yourself in a room alone (i.e. bathroom). When caring for infants or small children, sit down when holding, feeding, or changing them to minimize risk of injury to the child in the event you  have a seizure. Maintain good sleep hygiene. Avoid alcohol     Discharge Exam: Filed Weights   12/21/16 2158  Weight: 96 kg (211 lb 11.2 oz)   Vitals:   12/25/16 0509 12/25/16 1300  BP: (!) 148/60 135/83  Pulse: 66 85  Resp: 20 20  Temp: 98.6 F (37 C) 98.3 F (36.8 C)  SpO2: 92% 94%   General: Appear in mild distress, no Rash; Oral Mucosa moist. Cardiovascular: S1 and S2 Present, no Murmur, no JVD Respiratory: Bilateral Air entry present and Clear to Auscultation, no Crackles, no wheezes Abdomen: Bowel Sound present, Soft and no tenderness Extremities: no Pedal edema, no calf tenderness Neurology: Grossly no focal neuro deficit., tremors presents  The results  of significant diagnostics from this hospitalization (including imaging, microbiology, ancillary and laboratory) are listed below for reference.    Significant Diagnostic Studies: Ct Head Wo Contrast  Result Date: 12/21/2016 CLINICAL DATA:  Sudden extremity weakness, and type II diabetes mellitus EXAM: CT HEAD WITHOUT CONTRAST TECHNIQUE: Contiguous axial images were obtained from the base of the skull through the vertex without intravenous contrast. Sagittal and coronal MPR images reconstructed from axial data set. COMPARISON:  None. FINDINGS: Brain: Normal ventricular morphology. No midline shift or mass effect. Small old lacunar infarct at LEFT basal ganglia. Small vessel chronic ischemic changes of deep cerebral white matter. No intracranial hemorrhage, mass lesion, evidence of acute infarction, or extra-axial fluid collection. Vascular: Atherosclerotic calcification of internal carotid arteries bilaterally at skullbase Skull: Demineralized but intact Sinuses/Orbits: Small mucosal retention cyst versus polyp at RIGHT sphenoid sinus. Remaining paranasal sinuses and mastoid air cells clear. Other: N/A IMPRESSION: Atrophy with small vessel chronic ischemic changes of deep cerebral white matter. Small old lacunar infarct at LEFT  basal ganglia. No acute intracranial abnormalities. Electronically Signed   By: Lavonia Dana M.D.   On: 12/21/2016 19:13   Mr Brain Wo Contrast  Result Date: 12/22/2016 CLINICAL DATA:  Progressive right upper extremity weakness. Bilateral hand tremor. EXAM: MRI HEAD WITHOUT CONTRAST MRI CERVICAL SPINE WITHOUT CONTRAST TECHNIQUE: Multiplanar, multiecho pulse sequences of the brain and surrounding structures, and cervical spine, to include the craniocervical junction and cervicothoracic junction, were obtained without intravenous contrast. COMPARISON:  Head CT 12/21/2016 and MRI 08/08/2016. FINDINGS: MRI HEAD FINDINGS Brain: There is no evidence of acute infarct, intracranial hemorrhage, mass, midline shift, or extra-axial fluid collection. Generalized cerebral atrophy is within normal limits for age. Chronic lacunar infarcts are again noted in the left basal ganglia and left cerebellum. A subcentimeter chronic infarct in the subcortical white matter of the high posterior left frontal lobe is new from the prior MRI. T2 hyperintensities elsewhere in the cerebral white matter bilaterally are similar to the prior MRI allowing for differences in technique and are nonspecific but compatible with mild chronic small vessel ischemic disease. Vascular: Major intracranial vascular flow voids are preserved. Skull and upper cervical spine: Unremarkable bone marrow signal. Sinuses/Orbits: Unremarkable orbits. Paranasal sinuses and mastoid air cells are clear. Other: Unchanged 2 cm right frontal scalp lipoma. MRI CERVICAL SPINE FINDINGS Alignment: Minimal anterolisthesis of C7 on T1. Vertebrae: No fracture suspicious osseous lesion. Moderate marrow edema in the C5 and C6 vertebral bodies is favored to be degenerative, without abnormal disc signal suggestive of infectious discitis. Type 2 degenerative endplate changes are present at C4-5. Cord: Normal signal and morphology. Posterior Fossa, vertebral arteries, paraspinal tissues:  Unremarkable. Disc levels: C2-3:  Mild facet arthrosis.  No stenosis. C3-4: Mild disc bulging and right uncovertebral spurring result in mild right neural foraminal stenosis without spinal stenosis. C4-5: Moderate disc space narrowing. Broad-based posterior disc osteophyte complex asymmetric to the right and infolding of the ligamentum flavum result in mild spinal stenosis and moderate to severe right and mild to moderate left neural foraminal stenosis. C5-6: Severe disc space narrowing. Broad-based posterior disc osteophyte complex and asymmetric right uncovertebral spurring result in mild spinal stenosis and moderate to severe right and mild-to-moderate left neural foraminal stenosis. C6-7: Moderate to severe disc space narrowing. Broad-based posterior disc osteophyte complex results in mild spinal stenosis without significant neural foraminal stenosis. C7-T1:  Mild facet arthrosis without stenosis. IMPRESSION: 1. No acute intracranial abnormality. 2. Chronic small vessel ischemic disease as above. A chronic left  frontal subcortical lacunar infarct is new from 07/2016. 3. Multilevel cervical disc degeneration, greatest at C5-6 where there is degenerative endplate edema, mild spinal stenosis, and moderate to severe right neural foraminal stenosis. Electronically Signed   By: Logan Bores M.D.   On: 12/22/2016 07:44   Mr Cervical Spine Wo Contrast  Result Date: 12/22/2016 CLINICAL DATA:  Progressive right upper extremity weakness. Bilateral hand tremor. EXAM: MRI HEAD WITHOUT CONTRAST MRI CERVICAL SPINE WITHOUT CONTRAST TECHNIQUE: Multiplanar, multiecho pulse sequences of the brain and surrounding structures, and cervical spine, to include the craniocervical junction and cervicothoracic junction, were obtained without intravenous contrast. COMPARISON:  Head CT 12/21/2016 and MRI 08/08/2016. FINDINGS: MRI HEAD FINDINGS Brain: There is no evidence of acute infarct, intracranial hemorrhage, mass, midline shift, or  extra-axial fluid collection. Generalized cerebral atrophy is within normal limits for age. Chronic lacunar infarcts are again noted in the left basal ganglia and left cerebellum. A subcentimeter chronic infarct in the subcortical white matter of the high posterior left frontal lobe is new from the prior MRI. T2 hyperintensities elsewhere in the cerebral white matter bilaterally are similar to the prior MRI allowing for differences in technique and are nonspecific but compatible with mild chronic small vessel ischemic disease. Vascular: Major intracranial vascular flow voids are preserved. Skull and upper cervical spine: Unremarkable bone marrow signal. Sinuses/Orbits: Unremarkable orbits. Paranasal sinuses and mastoid air cells are clear. Other: Unchanged 2 cm right frontal scalp lipoma. MRI CERVICAL SPINE FINDINGS Alignment: Minimal anterolisthesis of C7 on T1. Vertebrae: No fracture suspicious osseous lesion. Moderate marrow edema in the C5 and C6 vertebral bodies is favored to be degenerative, without abnormal disc signal suggestive of infectious discitis. Type 2 degenerative endplate changes are present at C4-5. Cord: Normal signal and morphology. Posterior Fossa, vertebral arteries, paraspinal tissues: Unremarkable. Disc levels: C2-3:  Mild facet arthrosis.  No stenosis. C3-4: Mild disc bulging and right uncovertebral spurring result in mild right neural foraminal stenosis without spinal stenosis. C4-5: Moderate disc space narrowing. Broad-based posterior disc osteophyte complex asymmetric to the right and infolding of the ligamentum flavum result in mild spinal stenosis and moderate to severe right and mild to moderate left neural foraminal stenosis. C5-6: Severe disc space narrowing. Broad-based posterior disc osteophyte complex and asymmetric right uncovertebral spurring result in mild spinal stenosis and moderate to severe right and mild-to-moderate left neural foraminal stenosis. C6-7: Moderate to severe  disc space narrowing. Broad-based posterior disc osteophyte complex results in mild spinal stenosis without significant neural foraminal stenosis. C7-T1:  Mild facet arthrosis without stenosis. IMPRESSION: 1. No acute intracranial abnormality. 2. Chronic small vessel ischemic disease as above. A chronic left frontal subcortical lacunar infarct is new from 07/2016. 3. Multilevel cervical disc degeneration, greatest at C5-6 where there is degenerative endplate edema, mild spinal stenosis, and moderate to severe right neural foraminal stenosis. Electronically Signed   By: Logan Bores M.D.   On: 12/22/2016 07:44   US Renal  Result Date: 12/23/2016 CLINICAL DATA:  Acute kidney injury.  History of renal calculi. EXAM: RENAL / URINARY TRACT ULTRASOUND COMPLETE COMPARISON:  CT abdomen pelvis - 08/10/2015 FINDINGS: Right Kidney: Normal cortical thickness, echogenicity and size, measuring 12.9 cm in length. No focal renal lesions. Note is made of a approximately 1.2 cm echogenic shadowing stone within the interpolar aspect of the right kidney. No urinary obstruction. Left Kidney: While the left kidney is normal size measuring approximately 10.2 cm in length, there is diffuse left cortical thinning and increased cortical echogenicity, similar  to prior abdominal CT performed 07/2015. There are multiple clustered echogenic shadowing stones involving the mid and inferior pole of the left kidney, also similar to prior abdominal CT. Note is made of an approximate 1.9 x 1.7 x 1.6 cm left-sided renal cyst versus dilated calyx. No urinary obstruction. Bladder: Decompressed with a Foley catheter. IMPRESSION: 1. No evidence of urinary obstruction. 2. Re- demonstrated asymmetric left renal atrophy, similar to abdominal CT performed 07/2015. 3. Re- demonstrated bilateral nonobstructing nephrolithiasis, left greater than right, similar to the 07/2015 abdominal CT. Electronically Signed   By: Sandi Mariscal M.D.   On: 12/23/2016 19:40    Dg Chest Portable 1 View  Result Date: 12/21/2016 CLINICAL DATA:  Generalized weakness.  Diarrhea. EXAM: PORTABLE CHEST 1 VIEW COMPARISON:  08/08/2016 FINDINGS: Hazy densities in left hilum and the lateral left chest appear to be chronic and could be related to scarring. Again noted are low lung volumes. No new airspace disease or pulmonary edema. Heart size is normal. Atherosclerotic calcifications at the aortic arch. Trachea is midline. IMPRESSION: No active disease. Electronically Signed   By: Markus Daft M.D.   On: 12/21/2016 18:30    Microbiology: Recent Results (from the past 240 hour(s))  Urine culture     Status: Abnormal   Collection Time: 12/21/16  5:35 PM  Result Value Ref Range Status   Specimen Description URINE, CATHETERIZED  Final   Special Requests NONE  Final   Culture >=100,000 COLONIES/mL ESCHERICHIA COLI (A)  Final   Report Status 12/23/2016 FINAL  Final   Organism ID, Bacteria ESCHERICHIA COLI (A)  Final      Susceptibility   Escherichia coli - MIC*    AMPICILLIN >=32 RESISTANT Resistant     CEFAZOLIN >=64 RESISTANT Resistant     CEFTRIAXONE <=1 SENSITIVE Sensitive     CIPROFLOXACIN >=4 RESISTANT Resistant     GENTAMICIN >=16 RESISTANT Resistant     IMIPENEM <=0.25 SENSITIVE Sensitive     NITROFURANTOIN <=16 SENSITIVE Sensitive     TRIMETH/SULFA >=320 RESISTANT Resistant     AMPICILLIN/SULBACTAM >=32 RESISTANT Resistant     PIP/TAZO >=128 RESISTANT Resistant     Extended ESBL NEGATIVE Sensitive     * >=100,000 COLONIES/mL ESCHERICHIA COLI  MRSA PCR Screening     Status: None   Collection Time: 12/22/16  8:50 AM  Result Value Ref Range Status   MRSA by PCR NEGATIVE NEGATIVE Final    Comment:        The GeneXpert MRSA Assay (FDA approved for NASAL specimens only), is one component of a comprehensive MRSA colonization surveillance program. It is not intended to diagnose MRSA infection nor to guide or monitor treatment for MRSA infections.       Labs: CBC:  Recent Labs Lab 12/21/16 1730 12/22/16 0830 12/23/16 0401 12/24/16 0705 12/25/16 0438  WBC 8.2 5.9 6.5 6.0 5.8  HGB 11.6* 10.8* 10.3* 10.8* 11.5*  HCT 34.8* 33.4* 31.6* 33.2* 34.3*  MCV 94.1 95.2 94.0 94.1 92.7  PLT 156 142* 151 148* 470   Basic Metabolic Panel:  Recent Labs Lab 12/21/16 1730 12/22/16 0830 12/23/16 0401 12/24/16 0705 12/25/16 0438  NA 138 141 141 139 138  K 4.3 3.6 3.8 3.9 3.8  CL 102 106 108 109 108  CO2 25 26 24 23  21*  GLUCOSE 152* 110* 118* 94 111*  BUN 40* 35* 31* 23* 22*  CREATININE 1.65* 1.61* 1.53* 1.35* 1.31*  CALCIUM 9.0 8.7* 8.3* 8.3* 8.5*  MG  --   --  1.7 1.6*  --    Liver Function Tests:  Recent Labs Lab 12/21/16 1730  AST 33  ALT 20  ALKPHOS 85  BILITOT 1.0  PROT 6.1*  ALBUMIN 3.1*   No results for input(s): LIPASE, AMYLASE in the last 168 hours. No results for input(s): AMMONIA in the last 168 hours. Cardiac Enzymes: No results for input(s): CKTOTAL, CKMB, CKMBINDEX, TROPONINI in the last 168 hours. BNP (last 3 results)  Recent Labs  12/21/16 1730  BNP 64.1   CBG:  Recent Labs Lab 12/24/16 1214 12/24/16 1656 12/24/16 2053 12/25/16 0739 12/25/16 1151  GLUCAP 99 177* 166* 115* 113*   Time spent: 35 minutes  Signed:  PATEL, PRANAV  [facility]  12/25/2016  , 1:28 PM

## 2016-12-25 NOTE — Progress Notes (Signed)
CSW received SNF referral. Per MD, patient ready for discharge. Unfortunately, due to patient's legal history, we are unable to place the patient in SNF. RNCM aware and providing home health.   CSW signing off.  Percell Locus Carolene Gitto LCSWA [phone]

## 2016-12-25 NOTE — Progress Notes (Signed)
[facility] Progress Note  Patient: [name] [name] TKZ:[id number]   PCP: Gildardo Cranker, DO DOB: 08-20-1934   DOA: 12/21/2016   DOS: 12/25/2016   Date of Service: the patient was seen and examined on 12/25/2016  Subjective: Feeling better, no nausea no vomiting. treamors still present.  Brief hospital course: Pt. with PMH of type II DM, seizure; admitted on 12/21/2016, presented with complaint of weakness, was found to have UTI with Parkinson's disease. Patient reportedly wheelchair-bound at his baseline, had stroke and seizures. Recently released from jail. Currently further plan is continue IV fluids.  Assessment and Plan: 1. UTI due to Escherichia coli. Generalized weakness. Cultures come back positive for urine for multidrug-resistant organism sensitive to ceftriaxone, Primaxin and Macrobid. Clinically patient responding to IV ceftriaxone, changed to Vantin. Patient chronically incontinent at his baseline and also uses intermittent catheterization. Monitor for now. For outpatient urology follow-up.  2. History of seizures. History of CVA. Worsening of chronic generalized weakness. Parkinson's disease. Neurology was consulted, EEG negative. MRI brain no acute stroke, multilevel cervical degeneration on MRI C-spine. No acute abnormality Stroke workup was completed with echocardiogram, shows preserved EF and diastolic dysfunction as well as carotid Doppler which showed 60-79% stenosis in the left ICA Neurology currently signed off. Outpatient vascular surgery follow-up recommended. Continue Keppra for seizures. Outpatient orthopedic referral for degenerative disc disease of his spine. Also outpatient neurology referral. No driving until cleared by neurology as an outpatient.  3. Essential hypertension. Frequent PVCs. Hypotension. Stopping blood pressure medication. Has frequent PVCs on telemetry, asymptomatic. Monitor.  4. CKD III versus acute kidney injury. Chronic  incontinence. Patient reports chronic incontinence, will get ultrasound renal as he apparently has acute kidney injury present on day of admission. Urine output 3.2 L in the last 24 hours. Monitor With UTI, would prefer patient having outpatient urology follow-up. Patient was also on lisinopril hydrochlorothiazide at home which is currently on hold. Resume Flomax. Ultrasound renal shows no evidence of hydronephrosis, chronic nephrolithiasis without any obstruction.  5. Type 2 diabetes mellitus. Abdomen A1c was 8.0 and 6 2017. It is 5.7 on this admission, sugars are also well controlled. Monitor. Currently sliding scale sensitive.  Diet: Cardiac modified diet DVT Prophylaxis: subcutaneous Heparin  Advance goals of care discussion: full code  Family Communication: no family was present at bedside, at the time of interview.   Disposition:  Discharge to home health tomorrow.  Consultants: neurology Procedures: Echocardiogram, EEG  Antibiotics: Anti-infectives    Start     Dose/Rate Route Frequency Ordered Stop   12/25/16 0000  cefpodoxime (VANTIN) 200 MG tablet     200 mg Oral 2 times daily 12/25/16 0752 12/26/16 2359   12/24/16 1300  cefpodoxime (VANTIN) tablet 200 mg     200 mg Oral Every 12 hours 12/24/16 1213     12/22/16 2030  cefTRIAXone (ROCEPHIN) 1 g in dextrose 5 % 50 mL IVPB  Status:  Discontinued     1 g 100 mL/hr over 30 Minutes Intravenous Every 24 hours 12/21/16 2129 12/24/16 1211   12/21/16 2015  cefTRIAXone (ROCEPHIN) 1 g in dextrose 5 % 50 mL IVPB     1 g 100 mL/hr over 30 Minutes Intravenous  Once 12/21/16 2002 12/21/16 2102       Objective: Physical Exam: Vitals:   12/24/16 2054 12/25/16 0509 12/25/16 1300 12/25/16 2131  BP: (!) 157/74 (!) 148/60 135/83 (!) 156/91  Pulse: 67 66 85 95  Resp: 20 20 20 18   Temp: 98.3 F (  36.8 C) 98.6 F (37 C) 98.3 F (36.8 C) 98.1 F (36.7 C)  TempSrc: Oral Oral Oral Oral  SpO2: 98% 92% 94% 93%  Weight:        Height:        Intake/Output Summary (Last 24 hours) at 12/25/16 2231 Last data filed at 12/25/16 1532  Gross per 24 hour  Intake              360 ml  Output             2600 ml  Net            -2240 ml   Filed Weights   12/21/16 2158  Weight: 96 kg (211 lb 11.2 oz)   General: Alert, Awake and Oriented to Time, Place and Person. Appear in mild distress, affect appropriate Eyes: PERRL, Conjunctiva normal ENT: Oral Mucosa clear moist. Neck: no JVD, no Abnormal Mass Or lumps Cardiovascular: S1 and S2 Present, no Murmur, Peripheral Pulses Present Respiratory: normal respiratory effort, Bilateral Air entry equal and Decreased, no use of accessory muscle, Clear to Auscultation, no Crackles, no wheezes Abdomen: Bowel Sound present, Soft and no tenderness, no hernia Skin: no redness, no Rash, no induration Extremities: no Pedal edema, no calf tenderness Neurologic: Grossly no focal neuro deficit. Bilaterally Equal motor strength  Data Reviewed: CBC:  Recent Labs Lab 12/21/16 1730 12/22/16 0830 12/23/16 0401 12/24/16 0705 12/25/16 0438  WBC 8.2 5.9 6.5 6.0 5.8  HGB 11.6* 10.8* 10.3* 10.8* 11.5*  HCT 34.8* 33.4* 31.6* 33.2* 34.3*  MCV 94.1 95.2 94.0 94.1 92.7  PLT 156 142* 151 148* 409   Basic Metabolic Panel:  Recent Labs Lab 12/21/16 1730 12/22/16 0830 12/23/16 0401 12/24/16 0705 12/25/16 0438  NA 138 141 141 139 138  K 4.3 3.6 3.8 3.9 3.8  CL 102 106 108 109 108  CO2 25 26 24 23  21*  GLUCOSE 152* 110* 118* 94 111*  BUN 40* 35* 31* 23* 22*  CREATININE 1.65* 1.61* 1.53* 1.35* 1.31*  CALCIUM 9.0 8.7* 8.3* 8.3* 8.5*  MG  --   --  1.7 1.6*  --     Liver Function Tests:  Recent Labs Lab 12/21/16 1730  AST 33  ALT 20  ALKPHOS 85  BILITOT 1.0  PROT 6.1*  ALBUMIN 3.1*   No results for input(s): LIPASE, AMYLASE in the last 168 hours. No results for input(s): AMMONIA in the last 168 hours. Coagulation Profile: No results for input(s): INR, PROTIME in the  last 168 hours. Cardiac Enzymes: No results for input(s): CKTOTAL, CKMB, CKMBINDEX, TROPONINI in the last 168 hours. BNP (last 3 results) No results for input(s): PROBNP in the last 8760 hours. CBG:  Recent Labs Lab 12/24/16 1656 12/24/16 2053 12/25/16 0739 12/25/16 1151 12/25/16 1710  GLUCAP 177* 166* 115* 113* 104*   Studies: No results found.  Scheduled Meds: . aspirin  300 mg Rectal Daily   Or  . aspirin  325 mg Oral Daily  . cefpodoxime  200 mg Oral Q12H  . insulin aspart  0-9 Units Subcutaneous TID WC  . levETIRAcetam  500 mg Oral BID  . simvastatin  20 mg Oral q1800  . tamsulosin  0.4 mg Oral Daily   Continuous Infusions:  PRN Meds: acetaminophen **OR** acetaminophen (TYLENOL) oral liquid 160 mg/5 mL **OR** acetaminophen  Time spent: 25 minutes  Author: Berle Mull, MD [facility] Pager: [phone] 12/25/2016 10:31 PM  If 7PM-7AM, please contact night-coverage at [email or web address], password [facility]

## 2016-12-26 LAB — CBC
HCT: 37.7 % — ABNORMAL LOW (ref 39.0–52.0)
HEMOGLOBIN: 12.3 g/dL — AB (ref 13.0–17.0)
MCH: 30.3 pg (ref 26.0–34.0)
MCHC: 32.6 g/dL (ref 30.0–36.0)
MCV: 92.9 fL (ref 78.0–100.0)
Platelets: 184 10*3/uL (ref 150–400)
RBC: 4.06 MIL/uL — AB (ref 4.22–5.81)
RDW: 14.6 % (ref 11.5–15.5)
WBC: 6.8 10*3/uL (ref 4.0–10.5)

## 2016-12-26 LAB — BASIC METABOLIC PANEL
ANION GAP: 8 (ref 5–15)
BUN: 23 mg/dL — ABNORMAL HIGH (ref 6–20)
CHLORIDE: 108 mmol/L (ref 101–111)
CO2: 22 mmol/L (ref 22–32)
Calcium: 8.5 mg/dL — ABNORMAL LOW (ref 8.9–10.3)
Creatinine, Ser: 1.25 mg/dL — ABNORMAL HIGH (ref 0.61–1.24)
GFR, EST NON AFRICAN AMERICAN: 53 mL/min — AB (ref >60)
Glucose, Bld: 113 mg/dL — ABNORMAL HIGH (ref 65–99)
POTASSIUM: 3.9 mmol/L (ref 3.5–5.1)
SODIUM: 138 mmol/L (ref 135–145)

## 2016-12-26 LAB — GLUCOSE, CAPILLARY
GLUCOSE-CAPILLARY: 130 mg/dL — AB (ref 65–99)
GLUCOSE-CAPILLARY: 148 mg/dL — AB (ref 65–99)
Glucose-Capillary: 110 mg/dL — ABNORMAL HIGH (ref 65–99)

## 2016-12-26 NOTE — Progress Notes (Addendum)
[name] [name] to be D/C'd Home per MD order.  Discussed with the patient and all questions fully answered.  VSS, IV catheter discontinued intact. Site without signs and symptoms of complications. Dressing and pressure applied.  An After Visit Summary was printed and given to the patient. Patient received prescription.  D/c education completed with patient/family including follow up instructions, medication list, d/c activities limitations if indicated, with other d/c instructions as indicated by MD - patient able to verbalize understanding, all questions fully answered.   Patient instructed to return to ED, call 911, or call MD for any changes in condition.   Patient D/C'd home via [facility].  [name] 12/26/2016 5:37 PM

## 2016-12-26 NOTE — Progress Notes (Addendum)
Pt was unable to get a rollator from [place]. CM called and message left.

## 2016-12-26 NOTE — Progress Notes (Addendum)
Medical Necessity Form and Facesheet printed to 5W RN station. Spoke w RN Ellard Artis, he is aware to call [facility] at [phone] when patient is ready to DC.  Patient states he wants the same kind of WC that the hospital has, explained to patient that he cannot scoot with his feet on this kind of WC bc it locks unless handle is squeezed. Patient would like a rollator. [facility] will not transport DME. Spoke w patient, he states that a family member will get a rollator at [employer] when they get off work at 3:00 and bring it to the home.

## 2016-12-26 NOTE — Progress Notes (Signed)
Pt wife was able to get a rollator from [facility]. [facility] called and pt getting ready for D/C.

## 2016-12-28 ENCOUNTER — Telehealth: Payer: Self-pay

## 2016-12-28 DIAGNOSIS — Z748 Other problems related to care provider dependency: Secondary | ICD-10-CM

## 2016-12-28 NOTE — Telephone Encounter (Signed)
Transition Care Management Follow-up Telephone Call   Date discharged? 12/26/2016   How have you been since you were released from the hospital? Weak, unable to do anything at home.    Do you understand why you were in the hospital? yes   Do you understand the discharge instructions? yes  Where were you discharged to? Home  Items Reviewed:  Medications reviewed: yes  Allergies reviewed: yes  Dietary changes reviewed: yes Referrals reviewed: yes  Functional Questionnaire:   Activities of Daily Living (ADLs):   He states they are independent in the following:  Dressing, feeding.  States they require assistance with the following: ambulation with walker, bathing, transportation, continence.    Any transportation issues/concerns?: Yes, will place referral to [facility] care team.    Any patient concerns? Patient feels he needs rehab.    Confirmed importance and date/time of follow-up visits scheduled yes  Provider Appointment booked with Sherrie Mustache on 12/30/2016 at 2:15pm   Confirmed with patient if condition begins to worsen call PCP or go to the ER.  Patient was given the office number and encouraged to call back with question or concerns.  : yes

## 2016-12-30 ENCOUNTER — Encounter: Payer: Self-pay | Admitting: Nurse Practitioner

## 2016-12-30 ENCOUNTER — Telehealth: Payer: Self-pay

## 2016-12-30 ENCOUNTER — Other Ambulatory Visit: Payer: Self-pay

## 2016-12-30 MED ORDER — METFORMIN HCL 1000 MG PO TABS: 1000.0000 mg | ORAL_TABLET | Freq: Two times a day (BID) | ORAL | 0 refills | Status: DC

## 2016-12-30 MED ORDER — SITAGLIPTIN PHOSPHATE 50 MG PO TABS: 50.0000 mg | ORAL_TABLET | Freq: Every day | ORAL | 0 refills | Status: DC

## 2016-12-30 NOTE — Telephone Encounter (Signed)
Okay to refill until pending appt. If he has not taken in several days may want to take 1 tablet daily for a few days and then increase to 2 tablets to avoid side effects

## 2016-12-30 NOTE — Telephone Encounter (Signed)
A refill of metformin and Januvia was sent to pharmacy electronically for a limited supply to last until upcoming appt.   Metformin #15 Januvia #10  Patient notified and agreed.

## 2016-12-30 NOTE — Telephone Encounter (Signed)
Patient was scheduled for a TOC visit today at 2:15 pm, patient called to confirm that [facility] is providing EMS transportation to appointment today.  I informed patient that we do not set up this service for patients. Patient is responsible for setting up EMS/Ambulance transport, patient informed he will need to discuss cost with the EMS staff. Patient stated whomever scheduled appointment told him we would set this up.  I reviewed chart and [name] Aas, LPN scheduled appointment and placed a [facility] referral to have transportation services set up. I informed patient this dose not refer to EMS transport. Patient aware he will need to have someone assist him with getting out of the car for [facility] staff is not trained to lift and transport patients. [facility] will assist with setting up transportation and I was unsure if there is a company that will also assist with lifting and transporting patient ( I informed Dee[place]Dee, [facility] Care Guide of patients specific need)  Appointment was rescheduled for [facility]3 referral was not complete. Patient asked if [name] [name] will prescribe metformin until pending appointment, patient out of medication x several days.  Please advise (see last A1c checked while hospitalized)

## 2016-12-30 NOTE — Telephone Encounter (Signed)
Medications refilled for limited amounts. Patient notified and agreed.

## 2016-12-31 ENCOUNTER — Telehealth: Payer: Self-pay | Admitting: Internal Medicine

## 2016-12-31 DIAGNOSIS — G20 Parkinson's disease: Secondary | ICD-10-CM | POA: Diagnosis not present

## 2016-12-31 DIAGNOSIS — I69351 Hemiplegia and hemiparesis following cerebral infarction affecting right dominant side: Secondary | ICD-10-CM | POA: Diagnosis not present

## 2016-12-31 DIAGNOSIS — I129 Hypertensive chronic kidney disease with stage 1 through stage 4 chronic kidney disease, or unspecified chronic kidney disease: Secondary | ICD-10-CM | POA: Diagnosis not present

## 2016-12-31 DIAGNOSIS — G40909 Epilepsy, unspecified, not intractable, without status epilepticus: Secondary | ICD-10-CM | POA: Diagnosis not present

## 2016-12-31 NOTE — Telephone Encounter (Signed)
I followed up with the patient.  He verified that someone came from [facility] today, and he was also able to pick up his metformin.  He stated that the Januvia was too expensive because his [employer] hadn't kicked in yet.  I verified that he does have [facility] Medicare and not traditional Medicare, so I explained that transportation may be available through [facility].  He will check with them and call me back.  I also explained that he could apply for [facility] transportation but the application process wouldn't be complete before his appointment next week. VDM (DD)

## 2017-01-01 ENCOUNTER — Telehealth: Payer: Self-pay | Admitting: *Deleted

## 2017-01-01 NOTE — Telephone Encounter (Signed)
Received fax from [place] regarding FL2 from. Stated they completed patient's [facility] 12/31/16 and he is appropriate for SNF, stated he is barely ambulatory and unable to care for his ADLs, caregiver is blind and overwhelmed.   FL2 formed filled out by [employer], [place] [facility] [phone] Fax: [phone]. Needs Dr. Vale Haven signature. Placed in Dr. Vale Haven folder for review.

## 2017-01-06 ENCOUNTER — Encounter: Payer: Self-pay | Admitting: Nurse Practitioner

## 2017-01-07 NOTE — Discharge Summary (Signed)
[facility] Discharge Summary   Patient: [name] [name] ELF:[id number]   PCP: Gildardo Cranker, DO DOB: 1934/12/01   Date of admission: 12/21/2016   Date of discharge: 12/26/2016     Discharge Diagnoses:  Principal Problem:   Weakness Active Problems:   HTN (hypertension)   Diabetes mellitus, type 2 ([place])   Hyperlipidemia associated with type 2 diabetes mellitus ([place])   Contracture of muscle of right upper arm   UTI (urinary tract infection)   Admitted From: home Disposition:  Home with home health  Recommendations for Outpatient Follow-up:  1. Please follow-up with PCP in one week. 2. Patient is to establish care with neurology for follow-up on Parkinson's disease as well as seizure history, urology for follow-up on urinary incontinence and UTI, neurosurgery for follow-up on C-spine stenosis  Follow-up Information    Gildardo Cranker, DO. Schedule an appointment as soon as possible for a visit in 1 week(s).   Specialty:  Internal Medicine Contact information: [place] [address] [phone]        [facility]. Schedule an appointment as soon as possible for a visit in 1 month(s).   Contact information: [address]     [place] [address] [place]. Schedule an appointment as soon as possible for a visit in 1 month(s).   Contact information: [place] [address] [phone]        [facility] [place]. Schedule an appointment as soon as possible for a visit in 1 month(s).   Specialty:  Neurosurgery Contact information: [address] [place] [address] [phone]        [facility] Follow up.   Why:  they will contact you in the next 1-2 days to set up your first [facility] appointment Contact information: [place]  [phone]        [facility] - Dme Follow up.   Why:  ROLLATOR TO BE DELIVERED TO ROOM PRIOR TO DC Contact information: [address] [address] [phone]          Diet recommendation: Cardiac modified diet  Activity: The patient is advised to gradually reintroduce usual activities.  Discharge Condition: good  Code Status: Full code  History of present illness: As per the H and P dictated on admission, " [name] [name] is a 82 y.o. male with medical history significant of DM, seizure disorder who just released from jail Friday (today is Monday) after being there for a year comes in for weakness.  Pt reports he has been wheelchair bound for months there and was told he had a stroke.  He has gotten minimal therapy.  Was not hospitalized per his report and officially diagnosed with a cva.  Denies fevers.  He is chronically incontinent of urine.  He wears a depends.  Pt is staying with a friend of his who is blind.  He comes in due to his weakness for months and ct showing likely new infarct.  Pt reports he has had more difficulty with using his right hand, arm and his right fingers are contracted.  Pt referred for admission for possible stroke"  Hospital Course:  Summary of his active problems in the hospital is as following. 1. UTI due to Escherichia coli. Generalized weakness. Cultures come back positive for  urine for multidrug-resistant organism sensitive to ceftriaxone, Primaxin and Macrobid. Clinically patient responding to IV ceftriaxone, changed to Vantin. Patient chronically incontinent at his baseline and also wants to use intermittent catheterization. For outpatient urology follow-up.  2. History of seizures. History of CVA. Worsening of chronic generalized weakness. Parkinson's disease. Neurology was consulted, EEG negative. MRI brain no acute stroke, multilevel cervical degeneration on MRI C-spine. No acute abnormality Stroke  workup was completed with echocardiogram, shows preserved EF and diastolic dysfunction as well as carotid Doppler which showed 60-79% stenosis in the left ICA Neurology currently signed off. Outpatient vascular surgery follow-up recommended. Continue Keppra for seizures. Outpatient neurosurgery and referral for degenerative disc disease of his spine. Also outpatient neurology referral. No driving until cleared by neurology as an outpatient.  3. Essential hypertension. Frequent PVCs. Hypotension. Blood pressure improved with IV hydration Stopping blood pressure medication. Has frequent PVCs on telemetry, asymptomatic. Monitor.  4. CKD III versus acute kidney injury. Chronic incontinence. Patient reports chronic incontinence, will get ultrasound renal as he apparently has acute kidney injury present on day of admission. Urine output 3.2 L in the last 24 hours. Monitor With UTI, would prefer patient having outpatient urology follow-up. Patient was also on lisinopril hydrochlorothiazide at home which is currently on hold. Resume Flomax. Ultrasound renal shows no evidence of hydronephrosis, chronic nephrolithiasis without any obstruction.  5. Type 2 diabetes mellitus. Abdomen A1c was 8.0 and 6 2017. It is 5.7 on this admission, sugars are also well controlled. Monitor. Currently sliding scale sensitive.   All other chronic medical condition were stable during the hospitalization.  Patient was seen by physical therapy, who recommended SNF, social worker wasn't able to arrange SNF, home health was was arranged by Education officer, museum and case Freight forwarder. On the day of the discharge the patient's vitals were stable, and no other acute medical condition were reported by patient. the patient was felt safe to be discharge at home with home health.  Procedures and Results:  Echocardiogram    Consultations:  Neurology   DISCHARGE MEDICATION: Discharge Medication List as of 12/26/2016  4:40 PM     START taking these medications   Details  cefpodoxime (VANTIN) 200 MG tablet Take 1 tablet (200 mg total) by mouth 2 (two) times daily., Starting Fri 12/25/2016, Until Sat 12/26/2016, Normal      CONTINUE these medications which have NOT CHANGED   Details  levETIRAcetam (KEPPRA) 500 MG tablet Take 1 tablet (500 mg total) by mouth 2 (two) times daily., Starting Mon 08/10/2016, Print    simvastatin (ZOCOR) 20 MG tablet Take 20 mg by mouth daily. , Historical Med    tamsulosin (FLOMAX) 0.4 MG CAPS capsule Take 1 capsule (0.4 mg total) by mouth daily after supper., Starting Mon 09/02/2015, Print    metFORMIN (GLUCOPHAGE) 1000 MG tablet Take 1,000 mg by mouth 2 (two) times daily with a meal., Historical Med    sitaGLIPtin (JANUVIA) 50 MG tablet Take 1 tablet (50 mg total) by mouth daily., Starting Mon 09/30/2015, Normal      STOP taking these medications     HYDROcodone-acetaminophen (NORCO) 10-325 MG tablet      lisinopril-hydrochlorothiazide (PRINZIDE,ZESTORETIC) 20-12.5 MG tablet      loperamide (IMODIUM A-D) 2 MG tablet      Tdap (BOOSTRIX) 5-2.5-18.5 LF-MCG/0.5 injection      Zoster Vaccine Live, PF, (ZOSTAVAX) 76195 UNT/0.65ML injection        No Known Allergies Discharge Instructions    Ambulatory referral to Neurology  Complete by:  As directed    An appointment is requested in approximately: 4 weeks   Ambulatory referral to Neurosurgery    Complete by:  As directed    Ambulatory referral to Urology    Complete by:  As directed    Diet - low sodium heart healthy    Complete by:  As directed    Discharge instructions    Complete by:  As directed    It is important that you read following instructions as well as go over your medication list with RN to help you understand your care after this hospitalization.  Discharge Instructions: Please follow-up with PCP in one week  Please request your primary care physician to go over all Hospital Tests and  Procedure/Radiological results at the follow up,  Please get all Hospital records sent to your PCP by signing hospital release before you go home.   Do not drive, operating heavy machinery, perform activities at heights, swimming or participation in water activities or provide baby sitting services; until you have been seen by Primary Care Physician or a Neurologist and advised to do so again. Do not take more than prescribed Pain, Sleep and Anxiety Medications. You were cared for by a hospitalist during your hospital stay. If you have any questions about your discharge medications or the care you received while you were in the hospital after you are discharged, you can call the unit and ask to speak with the hospitalist on call if the hospitalist that took care of you is not available.  Once you are discharged, your primary care physician will handle any further medical issues. Please note that NO REFILLS for any discharge medications will be authorized once you are discharged, as it is imperative that you return to your primary care physician (or establish a relationship with a primary care physician if you do not have one) for your aftercare needs so that they can reassess your need for medications and monitor your lab values. You Must read complete instructions/literature along with all the possible adverse reactions/side effects for all the Medicines you take and that have been prescribed to you. Take any new Medicines after you have completely understood and accept all the possible adverse reactions/side effects. Wear Seat belts while driving. If you have smoked or chewed Tobacco in the last 2 yrs please stop smoking and/or stop any Recreational drug use.   Driving Restrictions    Complete by:  As directed    DO NOT [place]   Increase activity slowly    Complete by:  As directed    Other Restrictions    Complete by:  As directed    Per [facility] statutes,  patients with seizures are not allowed to drive until they have been seizure-free for six months.   Use caution when using heavy equipment or power tools. Avoid working on ladders or at heights. Take showers instead of baths. Ensure the water temperature is not too high on the home water heater. Do not go swimming alone. Do not lock yourself in a room alone (i.e. bathroom). When caring for infants or small children, sit down when holding, feeding, or changing them to minimize risk of injury to the child in the event you have a seizure. Maintain good sleep hygiene. Avoid alcohol     Discharge Exam: Filed Weights   12/21/16 2158  Weight: 96 kg (211 lb 11.2 oz)   Vitals:   12/26/16 0526 12/26/16 1350  BP: (!) 159/71 (!) 150/95  Pulse: 70 77  Resp: 18   Temp: 98.7 F (37.1 C) 97.8 F (36.6 C)  SpO2: 94% 97%   General: Appear in mild distress, no Rash; Oral Mucosa moist. Cardiovascular: S1 and S2 Present, no Murmur, no JVD Respiratory: Bilateral Air entry present and Clear to Auscultation, no Crackles, no wheezes Abdomen: Bowel Sound present, Soft and no tenderness Extremities: no Pedal edema, no calf tenderness Neurology: Grossly no focal neuro deficit., tremors presents  The results of significant diagnostics from this hospitalization (including imaging, microbiology, ancillary and laboratory) are listed below for reference.    Significant Diagnostic Studies: Ct Head Wo Contrast  Result Date: 12/21/2016 CLINICAL DATA:  Sudden extremity weakness, and type II diabetes mellitus EXAM: CT HEAD WITHOUT CONTRAST TECHNIQUE: Contiguous axial images were obtained from the base of the skull through the vertex without intravenous contrast. Sagittal and coronal MPR images reconstructed from axial data set. COMPARISON:  None. FINDINGS: Brain: Normal ventricular morphology. No midline shift or mass effect. Small old lacunar infarct at LEFT basal ganglia. Small vessel chronic ischemic changes of deep  cerebral white matter. No intracranial hemorrhage, mass lesion, evidence of acute infarction, or extra-axial fluid collection. Vascular: Atherosclerotic calcification of internal carotid arteries bilaterally at skullbase Skull: Demineralized but intact Sinuses/Orbits: Small mucosal retention cyst versus polyp at RIGHT sphenoid sinus. Remaining paranasal sinuses and mastoid air cells clear. Other: N/A IMPRESSION: Atrophy with small vessel chronic ischemic changes of deep cerebral white matter. Small old lacunar infarct at LEFT basal ganglia. No acute intracranial abnormalities. Electronically Signed   By: Lavonia Dana M.D.   On: 12/21/2016 19:13   Mr Brain Wo Contrast  Result Date: 12/22/2016 CLINICAL DATA:  Progressive right upper extremity weakness. Bilateral hand tremor. EXAM: MRI HEAD WITHOUT CONTRAST MRI CERVICAL SPINE WITHOUT CONTRAST TECHNIQUE: Multiplanar, multiecho pulse sequences of the brain and surrounding structures, and cervical spine, to include the craniocervical junction and cervicothoracic junction, were obtained without intravenous contrast. COMPARISON:  Head CT 12/21/2016 and MRI 08/08/2016. FINDINGS: MRI HEAD FINDINGS Brain: There is no evidence of acute infarct, intracranial hemorrhage, mass, midline shift, or extra-axial fluid collection. Generalized cerebral atrophy is within normal limits for age. Chronic lacunar infarcts are again noted in the left basal ganglia and left cerebellum. A subcentimeter chronic infarct in the subcortical white matter of the high posterior left frontal lobe is new from the prior MRI. T2 hyperintensities elsewhere in the cerebral white matter bilaterally are similar to the prior MRI allowing for differences in technique and are nonspecific but compatible with mild chronic small vessel ischemic disease. Vascular: Major intracranial vascular flow voids are preserved. Skull and upper cervical spine: Unremarkable bone marrow signal. Sinuses/Orbits: Unremarkable  orbits. Paranasal sinuses and mastoid air cells are clear. Other: Unchanged 2 cm right frontal scalp lipoma. MRI CERVICAL SPINE FINDINGS Alignment: Minimal anterolisthesis of C7 on T1. Vertebrae: No fracture suspicious osseous lesion. Moderate marrow edema in the C5 and C6 vertebral bodies is favored to be degenerative, without abnormal disc signal suggestive of infectious discitis. Type 2 degenerative endplate changes are present at C4-5. Cord: Normal signal and morphology. Posterior Fossa, vertebral arteries, paraspinal tissues: Unremarkable. Disc levels: C2-3:  Mild facet arthrosis.  No stenosis. C3-4: Mild disc bulging and right uncovertebral spurring result in mild right neural foraminal stenosis without spinal stenosis. C4-5: Moderate disc space narrowing. Broad-based posterior disc osteophyte complex asymmetric to the right and infolding of the ligamentum flavum result in mild spinal stenosis and moderate to  severe right and mild to moderate left neural foraminal stenosis. C5-6: Severe disc space narrowing. Broad-based posterior disc osteophyte complex and asymmetric right uncovertebral spurring result in mild spinal stenosis and moderate to severe right and mild-to-moderate left neural foraminal stenosis. C6-7: Moderate to severe disc space narrowing. Broad-based posterior disc osteophyte complex results in mild spinal stenosis without significant neural foraminal stenosis. C7-T1:  Mild facet arthrosis without stenosis. IMPRESSION: 1. No acute intracranial abnormality. 2. Chronic small vessel ischemic disease as above. A chronic left frontal subcortical lacunar infarct is new from 07/2016. 3. Multilevel cervical disc degeneration, greatest at C5-6 where there is degenerative endplate edema, mild spinal stenosis, and moderate to severe right neural foraminal stenosis. Electronically Signed   By: Logan Bores M.D.   On: 12/22/2016 07:44   Mr Cervical Spine Wo Contrast  Result Date: 12/22/2016 CLINICAL  DATA:  Progressive right upper extremity weakness. Bilateral hand tremor. EXAM: MRI HEAD WITHOUT CONTRAST MRI CERVICAL SPINE WITHOUT CONTRAST TECHNIQUE: Multiplanar, multiecho pulse sequences of the brain and surrounding structures, and cervical spine, to include the craniocervical junction and cervicothoracic junction, were obtained without intravenous contrast. COMPARISON:  Head CT 12/21/2016 and MRI 08/08/2016. FINDINGS: MRI HEAD FINDINGS Brain: There is no evidence of acute infarct, intracranial hemorrhage, mass, midline shift, or extra-axial fluid collection. Generalized cerebral atrophy is within normal limits for age. Chronic lacunar infarcts are again noted in the left basal ganglia and left cerebellum. A subcentimeter chronic infarct in the subcortical white matter of the high posterior left frontal lobe is new from the prior MRI. T2 hyperintensities elsewhere in the cerebral white matter bilaterally are similar to the prior MRI allowing for differences in technique and are nonspecific but compatible with mild chronic small vessel ischemic disease. Vascular: Major intracranial vascular flow voids are preserved. Skull and upper cervical spine: Unremarkable bone marrow signal. Sinuses/Orbits: Unremarkable orbits. Paranasal sinuses and mastoid air cells are clear. Other: Unchanged 2 cm right frontal scalp lipoma. MRI CERVICAL SPINE FINDINGS Alignment: Minimal anterolisthesis of C7 on T1. Vertebrae: No fracture suspicious osseous lesion. Moderate marrow edema in the C5 and C6 vertebral bodies is favored to be degenerative, without abnormal disc signal suggestive of infectious discitis. Type 2 degenerative endplate changes are present at C4-5. Cord: Normal signal and morphology. Posterior Fossa, vertebral arteries, paraspinal tissues: Unremarkable. Disc levels: C2-3:  Mild facet arthrosis.  No stenosis. C3-4: Mild disc bulging and right uncovertebral spurring result in mild right neural foraminal stenosis  without spinal stenosis. C4-5: Moderate disc space narrowing. Broad-based posterior disc osteophyte complex asymmetric to the right and infolding of the ligamentum flavum result in mild spinal stenosis and moderate to severe right and mild to moderate left neural foraminal stenosis. C5-6: Severe disc space narrowing. Broad-based posterior disc osteophyte complex and asymmetric right uncovertebral spurring result in mild spinal stenosis and moderate to severe right and mild-to-moderate left neural foraminal stenosis. C6-7: Moderate to severe disc space narrowing. Broad-based posterior disc osteophyte complex results in mild spinal stenosis without significant neural foraminal stenosis. C7-T1:  Mild facet arthrosis without stenosis. IMPRESSION: 1. No acute intracranial abnormality. 2. Chronic small vessel ischemic disease as above. A chronic left frontal subcortical lacunar infarct is new from 07/2016. 3. Multilevel cervical disc degeneration, greatest at C5-6 where there is degenerative endplate edema, mild spinal stenosis, and moderate to severe right neural foraminal stenosis. Electronically Signed   By: Logan Bores M.D.   On: 12/22/2016 07:44   US Renal  Result Date: 12/23/2016 CLINICAL DATA:  Acute  kidney injury.  History of renal calculi. EXAM: RENAL / URINARY TRACT ULTRASOUND COMPLETE COMPARISON:  CT abdomen pelvis - 08/10/2015 FINDINGS: Right Kidney: Normal cortical thickness, echogenicity and size, measuring 12.9 cm in length. No focal renal lesions. Note is made of a approximately 1.2 cm echogenic shadowing stone within the interpolar aspect of the right kidney. No urinary obstruction. Left Kidney: While the left kidney is normal size measuring approximately 10.2 cm in length, there is diffuse left cortical thinning and increased cortical echogenicity, similar to prior abdominal CT performed 07/2015. There are multiple clustered echogenic shadowing stones involving the mid and inferior pole of the left  kidney, also similar to prior abdominal CT. Note is made of an approximate 1.9 x 1.7 x 1.6 cm left-sided renal cyst versus dilated calyx. No urinary obstruction. Bladder: Decompressed with a Foley catheter. IMPRESSION: 1. No evidence of urinary obstruction. 2. Re- demonstrated asymmetric left renal atrophy, similar to abdominal CT performed 07/2015. 3. Re- demonstrated bilateral nonobstructing nephrolithiasis, left greater than right, similar to the 07/2015 abdominal CT. Electronically Signed   By: Sandi Mariscal M.D.   On: 12/23/2016 19:40   Dg Chest Portable 1 View  Result Date: 12/21/2016 CLINICAL DATA:  Generalized weakness.  Diarrhea. EXAM: PORTABLE CHEST 1 VIEW COMPARISON:  08/08/2016 FINDINGS: Hazy densities in left hilum and the lateral left chest appear to be chronic and could be related to scarring. Again noted are low lung volumes. No new airspace disease or pulmonary edema. Heart size is normal. Atherosclerotic calcifications at the aortic arch. Trachea is midline. IMPRESSION: No active disease. Electronically Signed   By: Markus Daft M.D.   On: 12/21/2016 18:30    Microbiology: No results found for this or any previous visit (from the past 240 hour(s)).   Labs: CBC: No results for input(s): WBC, NEUTROABS, HGB, HCT, MCV, PLT in the last 168 hours. Basic Metabolic Panel: No results for input(s): NA, K, CL, CO2, GLUCOSE, BUN, CREATININE, CALCIUM, MG, PHOS in the last 168 hours. Liver Function Tests: No results for input(s): AST, ALT, ALKPHOS, BILITOT, PROT, ALBUMIN in the last 168 hours. No results for input(s): LIPASE, AMYLASE in the last 168 hours. No results for input(s): AMMONIA in the last 168 hours. Cardiac Enzymes: No results for input(s): CKTOTAL, CKMB, CKMBINDEX, TROPONINI in the last 168 hours. BNP (last 3 results)  Recent Labs  12/21/16 1730  BNP 64.1   CBG: No results for input(s): GLUCAP in the last 168 hours. Time spent: 35 minutes  Signed:  Siani Utke,  Yanique Mulvihill  [facility] 12/26/2016   , 11:34 AM

## 2017-01-13 ENCOUNTER — Telehealth: Payer: Self-pay

## 2017-01-13 NOTE — Telephone Encounter (Signed)
Curt Bears with [facility] called regarding orders that were faxed to office for patient.   Orders were signed by Dr. Eulas Post on 01/12/17 and faxed to number listed on fax. I called Curt Bears back to let her know that orders were faxed and she asked that I fax the orders to [phone]. Orders were faxed to new number.   Forms were placed in my mailbox until we confirm that fax was received.

## 2017-01-16 DIAGNOSIS — N400 Enlarged prostate without lower urinary tract symptoms: Secondary | ICD-10-CM | POA: Diagnosis not present

## 2017-01-16 DIAGNOSIS — M1990 Unspecified osteoarthritis, unspecified site: Secondary | ICD-10-CM | POA: Diagnosis not present

## 2017-01-16 DIAGNOSIS — Z8673 Personal history of transient ischemic attack (TIA), and cerebral infarction without residual deficits: Secondary | ICD-10-CM | POA: Diagnosis not present

## 2017-01-16 DIAGNOSIS — E119 Type 2 diabetes mellitus without complications: Secondary | ICD-10-CM | POA: Diagnosis not present

## 2017-01-16 DIAGNOSIS — G40909 Epilepsy, unspecified, not intractable, without status epilepticus: Secondary | ICD-10-CM | POA: Diagnosis not present

## 2017-01-16 DIAGNOSIS — E118 Type 2 diabetes mellitus with unspecified complications: Secondary | ICD-10-CM | POA: Diagnosis not present

## 2017-01-16 DIAGNOSIS — Z0001 Encounter for general adult medical examination with abnormal findings: Secondary | ICD-10-CM | POA: Diagnosis not present

## 2017-02-03 DIAGNOSIS — N183 Chronic kidney disease, stage 3 (moderate): Secondary | ICD-10-CM | POA: Diagnosis not present

## 2017-02-11 DIAGNOSIS — I129 Hypertensive chronic kidney disease with stage 1 through stage 4 chronic kidney disease, or unspecified chronic kidney disease: Secondary | ICD-10-CM | POA: Diagnosis not present

## 2017-02-17 DIAGNOSIS — E119 Type 2 diabetes mellitus without complications: Secondary | ICD-10-CM | POA: Diagnosis not present

## 2017-02-17 DIAGNOSIS — G40909 Epilepsy, unspecified, not intractable, without status epilepticus: Secondary | ICD-10-CM | POA: Diagnosis not present

## 2017-02-17 DIAGNOSIS — M1990 Unspecified osteoarthritis, unspecified site: Secondary | ICD-10-CM | POA: Diagnosis not present

## 2017-02-17 DIAGNOSIS — Z8673 Personal history of transient ischemic attack (TIA), and cerebral infarction without residual deficits: Secondary | ICD-10-CM | POA: Diagnosis not present

## 2017-02-17 DIAGNOSIS — E782 Mixed hyperlipidemia: Secondary | ICD-10-CM | POA: Diagnosis not present

## 2017-03-02 DIAGNOSIS — I69351 Hemiplegia and hemiparesis following cerebral infarction affecting right dominant side: Secondary | ICD-10-CM | POA: Diagnosis not present

## 2017-03-02 DIAGNOSIS — I129 Hypertensive chronic kidney disease with stage 1 through stage 4 chronic kidney disease, or unspecified chronic kidney disease: Secondary | ICD-10-CM | POA: Diagnosis not present

## 2017-03-02 DIAGNOSIS — G20 Parkinson's disease: Secondary | ICD-10-CM | POA: Diagnosis not present

## 2017-03-18 DIAGNOSIS — G40909 Epilepsy, unspecified, not intractable, without status epilepticus: Secondary | ICD-10-CM | POA: Diagnosis not present

## 2017-03-18 DIAGNOSIS — N400 Enlarged prostate without lower urinary tract symptoms: Secondary | ICD-10-CM | POA: Diagnosis not present

## 2017-03-18 DIAGNOSIS — E119 Type 2 diabetes mellitus without complications: Secondary | ICD-10-CM | POA: Diagnosis not present

## 2017-03-18 DIAGNOSIS — M1990 Unspecified osteoarthritis, unspecified site: Secondary | ICD-10-CM | POA: Diagnosis not present

## 2017-03-24 DIAGNOSIS — E1122 Type 2 diabetes mellitus with diabetic chronic kidney disease: Secondary | ICD-10-CM | POA: Diagnosis not present

## 2017-03-30 DIAGNOSIS — I739 Peripheral vascular disease, unspecified: Secondary | ICD-10-CM | POA: Diagnosis not present

## 2017-04-14 DIAGNOSIS — I129 Hypertensive chronic kidney disease with stage 1 through stage 4 chronic kidney disease, or unspecified chronic kidney disease: Secondary | ICD-10-CM | POA: Diagnosis not present

## 2017-04-15 DIAGNOSIS — E119 Type 2 diabetes mellitus without complications: Secondary | ICD-10-CM | POA: Diagnosis not present

## 2017-04-15 DIAGNOSIS — Z8673 Personal history of transient ischemic attack (TIA), and cerebral infarction without residual deficits: Secondary | ICD-10-CM | POA: Diagnosis not present

## 2017-04-15 DIAGNOSIS — N400 Enlarged prostate without lower urinary tract symptoms: Secondary | ICD-10-CM | POA: Diagnosis not present

## 2017-04-15 DIAGNOSIS — G40909 Epilepsy, unspecified, not intractable, without status epilepticus: Secondary | ICD-10-CM | POA: Diagnosis not present

## 2017-05-05 ENCOUNTER — Other Ambulatory Visit: Payer: Self-pay

## 2017-05-05 ENCOUNTER — Encounter: Payer: Self-pay | Admitting: Vascular Surgery

## 2017-05-05 ENCOUNTER — Ambulatory Visit: Payer: Medicare HMO | Admitting: Vascular Surgery

## 2017-05-05 DIAGNOSIS — I872 Venous insufficiency (chronic) (peripheral): Secondary | ICD-10-CM | POA: Diagnosis not present

## 2017-05-05 DIAGNOSIS — R6889 Other general symptoms and signs: Secondary | ICD-10-CM | POA: Diagnosis not present

## 2017-05-05 NOTE — Progress Notes (Signed)
Requested by:  Clovia Cuff, MD [place], [place] [id number]  Reason for consultation: change in foot color    History of Present Illness   [name] [name] is a 83 y.o. (04/27/1934) male who presents with chief complaint: blue hued feet.  Patient notes, color change in legs during a recent home nursing visit.  The home health nurse recommended further work-up and also elevating his feet.  The patient notes elevating his feet essential resolved the color change.  The patient's denies intermittent claudication, rest pain or any chronic venous insufficiency symptoms.  The patient has had no history of DVT, known history of varicose vein, no history of venous stasis ulcers, no history of  Lymphedema and no history of skin changes in lower legs.  There is no family history of venous disorders.  The patient has not used compression stockings in the past.  Atherosclerotic risk factors include: HLD, DM,  Past Medical History:  Diagnosis Date  . Arthritis   . Diabetes mellitus without complication ([place])    type 2  . Prostate disease   . Renal disorder    kidney stones  . Seizures ([place])   HLD OA BPG CVA  Past Surgical History:  Procedure Laterality Date  . CYSTOSCOPY W/ URETERAL STENT PLACEMENT Bilateral 08/10/2015   Procedure: CYSTOSCOPY WITH BILATERAL RETROGRADE PYELOGRAM/BILATERAL URETERAL STENT PLACEMENT;  Surgeon: Kathie Rhodes, MD;  Location: [facility];  Service: Urology;  Laterality: Bilateral;  . CYSTOSCOPY WITH RETROGRADE PYELOGRAM, URETEROSCOPY AND STENT PLACEMENT Bilateral 09/02/2015   Procedure: CYSTOSCOPY BILATERAL STENT REMOVAL BILATERAL RETROGRADE PYELOGRAM, BILATERAL URETEROSCOPY AND BILATERAL STENT PLACEMENT;  Surgeon: Kathie Rhodes, MD;  Location: [facility];  Service: Urology;  Laterality: Bilateral;  . HOLMIUM LASER APPLICATION Bilateral 5/00/9381   Procedure: BILATERAL HOLMIUM LASER  LITHO ;  Surgeon: Kathie Rhodes, MD;  Location: [facility];  Service: Urology;   Laterality: Bilateral;  . JOINT REPLACEMENT Right 2007   hip, Dr. Alvan Dame  . TONSILLECTOMY Bilateral    70 years ago    Social History   Socioeconomic History  . Marital status: Single    Spouse name: Not on file  . Number of children: Not on file  . Years of education: Not on file  . Highest education level: Not on file  Social Needs  . Financial resource strain: Not on file  . Food insecurity - worry: Not on file  . Food insecurity - inability: Not on file  . Transportation needs - medical: Not on file  . Transportation needs - non-medical: Not on file  Occupational History  . Not on file  Tobacco Use  . Smoking status: Never Smoker  . Smokeless tobacco: Never Used  Substance and Sexual Activity  . Alcohol use: No  . Drug use: No  . Sexual activity: Not on file  Other Topics Concern  . Not on file  Social History Narrative   Social History     Marital Status: Single              Spouse Name:                        Years of Education:                 Number of children:  0              Occupational History     None on file      Social  History Main Topics     Smoking Status: Never Smoker                       Smokeless Status: Not on file                        Alcohol Use: No               Drug Use: Not on file      Sexual Activity: Not on file            Other Topics            Concern     None on file      Social History Narrative   Patient lives in a 1 story house, 4 people presently live in the home. He does have 1 dog and cat has pets.            Family History  Problem Relation Age of Onset  . Early death Neg Hx   . Kidney disease Neg Hx     Current Outpatient Medications  Medication Sig Dispense Refill  . levETIRAcetam (KEPPRA) 500 MG tablet Take 1 tablet (500 mg total) by mouth 2 (two) times daily. 60 tablet 0  . metFORMIN (GLUCOPHAGE) 1000 MG tablet Take 1 tablet (1,000 mg total) by mouth 2 (two) times daily with a meal. 15 tablet 0  .  simvastatin (ZOCOR) 20 MG tablet Take 20 mg by mouth daily.     . sitaGLIPtin (JANUVIA) 50 MG tablet Take 1 tablet (50 mg total) by mouth daily. 10 tablet 0  . tamsulosin (FLOMAX) 0.4 MG CAPS capsule Take 1 capsule (0.4 mg total) by mouth daily after supper. (Patient taking differently: Take 0.4 mg by mouth daily. ) 30 capsule 0   No current facility-administered medications for this visit.     No Known Allergies  REVIEW OF SYSTEMS (negative unless checked):   Cardiac:  []  Chest pain or chest pressure? []  Shortness of breath upon activity? []  Shortness of breath when lying flat? []  Irregular heart rhythm?  Vascular:  []  Pain in calf, thigh, or hip brought on by walking? []  Pain in feet at night that wakes you up from your sleep? []  Blood clot in your veins? []  Leg swelling?  Pulmonary:  []  Oxygen at home? []  Productive cough? []  Wheezing?  Neurologic:  [x]  Sudden weakness in arms or legs? []  Sudden numbness in arms or legs? []  Sudden onset of difficult speaking or slurred speech? []  Temporary loss of vision in one eye? []  Problems with dizziness?  Gastrointestinal:  []  Blood in stool? []  Vomited blood?  Genitourinary:  []  Burning when urinating? []  Blood in urine?  Psychiatric:  []  Major depression  Hematologic:  []  Bleeding problems? []  Problems with blood clotting?  Dermatologic:  []  Rashes or ulcers?  Constitutional:  []  Fever or chills?  Ear/Nose/Throat:  []  Change in hearing? []  Nose bleeds? []  Sore throat?  Musculoskeletal:  []  Back pain? []  Joint pain? []  Muscle pain?   Physical Examination     Vitals:   05/05/17 1105  BP: (!) 169/84  Pulse: 70  Resp: 20  Temp: 97.6 F (36.4 C)  TempSrc: Oral  SpO2: 99%  Weight: 228 lb (103.4 kg)  Height: 5\' 5"  (1.651 m)   Body mass index is 37.94 kg/m.  General Alert, O x 3, WD, NAD, walker present  Head [place]/AT,  Ear/Nose/ Throat Hearing grossly intact, nares without erythema or  drainage, oropharynx without Erythema or Exudate, Mallampati score: 3,   Eyes PERRLA, EOMI,    Neck Supple, mid-line trachea,    Pulmonary Sym exp, good B air movt, CTA B  Cardiac RRR, Nl S1, S2, no Murmurs, No rubs, No S3,S4  Vascular Vessel Right Left  Radial Palpable Palpable  Brachial Palpable Palpable  Carotid Palpable, No Bruit Palpable, No Bruit  Aorta Not palpable N/A  Femoral Palpable Palpable  Popliteal Not palpable Not palpable  PT Faintly palpable Faintly palpable  DP Faintly palpable Faintly palpable    Gastro- intestinal soft, non-distended, non-tender to palpation, No guarding or rebound, no HSM, no masses, no CVAT B, No palpable prominent aortic pulse,    Musculo- skeletal M/S 5/5 throughout  , Extremities without ischemic changes  , Pitting edema present: R>L 2-3+, Varicosities present: B mod-large, No Lipodermatosclerosis present,  Neurologic Cranial nerves 2-12 intact , Pain and light touch intact in extremities , Motor exam as listed above  Psychiatric Judgement intact, Mood & affect appropriate for pt's clinical situation  Dermatologic See M/S exam for extremity exam, No rashes otherwise noted  Lymphatic  Palpable lymph nodes: None    Non-invasive Vascular Imaging   Outside BLE arterial (03/31/17)  Bi-tri in both legs  No hemodynamically significant stenoses in either leg   Outside Studies/Documentation   10 pages of outside documents were reviewed including: outside PCP chart and outside BLE arterial duplex.   Medical Decision Making   UVALDO RYBACKI is a 83 y.o. male who presents with: BLE chronic venous insufficiency (C3),    Patient does not have significant arterial disease in the legs based on the outside arterial duplex.  There is no way to determine the degree of the disease based on velocities, so the outside study is flawed in that aspect.  His foot cyanosis is due to his CVI, as evident by resolution with position change.  Based on the  patient's history and examination, I recommend: compressive therapy.  I discussed with the patient the use of her 20-30 mm thigh high compression stockings.  As the patient is relatively asx from his CVI, I doubt further evaluation of his venous anatomy is needed.  Thank you for allowing [place] to participate in this patient's care.   Adele Barthel, MD, FACS [facility] Office: [phone] Pager: [phone]  05/05/2017, 11:22 AM

## 2017-05-21 DIAGNOSIS — G40909 Epilepsy, unspecified, not intractable, without status epilepticus: Secondary | ICD-10-CM | POA: Diagnosis not present

## 2017-05-21 DIAGNOSIS — N400 Enlarged prostate without lower urinary tract symptoms: Secondary | ICD-10-CM | POA: Diagnosis not present

## 2017-05-21 DIAGNOSIS — Z8673 Personal history of transient ischemic attack (TIA), and cerebral infarction without residual deficits: Secondary | ICD-10-CM | POA: Diagnosis not present

## 2017-05-21 DIAGNOSIS — E119 Type 2 diabetes mellitus without complications: Secondary | ICD-10-CM | POA: Diagnosis not present

## 2017-07-15 DIAGNOSIS — Z8673 Personal history of transient ischemic attack (TIA), and cerebral infarction without residual deficits: Secondary | ICD-10-CM | POA: Diagnosis not present

## 2017-07-15 DIAGNOSIS — N400 Enlarged prostate without lower urinary tract symptoms: Secondary | ICD-10-CM | POA: Diagnosis not present

## 2017-07-15 DIAGNOSIS — E119 Type 2 diabetes mellitus without complications: Secondary | ICD-10-CM | POA: Diagnosis not present

## 2017-07-15 DIAGNOSIS — G40909 Epilepsy, unspecified, not intractable, without status epilepticus: Secondary | ICD-10-CM | POA: Diagnosis not present

## 2017-09-21 DIAGNOSIS — N400 Enlarged prostate without lower urinary tract symptoms: Secondary | ICD-10-CM | POA: Diagnosis not present

## 2017-09-21 DIAGNOSIS — E782 Mixed hyperlipidemia: Secondary | ICD-10-CM | POA: Diagnosis not present

## 2017-09-21 DIAGNOSIS — G40909 Epilepsy, unspecified, not intractable, without status epilepticus: Secondary | ICD-10-CM | POA: Diagnosis not present

## 2017-09-21 DIAGNOSIS — E119 Type 2 diabetes mellitus without complications: Secondary | ICD-10-CM | POA: Diagnosis not present

## 2017-09-30 DIAGNOSIS — E119 Type 2 diabetes mellitus without complications: Secondary | ICD-10-CM | POA: Diagnosis not present

## 2017-09-30 DIAGNOSIS — N400 Enlarged prostate without lower urinary tract symptoms: Secondary | ICD-10-CM | POA: Diagnosis not present

## 2017-09-30 DIAGNOSIS — M1990 Unspecified osteoarthritis, unspecified site: Secondary | ICD-10-CM | POA: Diagnosis not present

## 2017-09-30 DIAGNOSIS — G40909 Epilepsy, unspecified, not intractable, without status epilepticus: Secondary | ICD-10-CM | POA: Diagnosis not present

## 2017-11-25 DIAGNOSIS — M1990 Unspecified osteoarthritis, unspecified site: Secondary | ICD-10-CM | POA: Diagnosis not present

## 2017-11-25 DIAGNOSIS — E119 Type 2 diabetes mellitus without complications: Secondary | ICD-10-CM | POA: Diagnosis not present

## 2017-11-25 DIAGNOSIS — G40909 Epilepsy, unspecified, not intractable, without status epilepticus: Secondary | ICD-10-CM | POA: Diagnosis not present

## 2017-11-25 DIAGNOSIS — N400 Enlarged prostate without lower urinary tract symptoms: Secondary | ICD-10-CM | POA: Diagnosis not present

## 2017-11-25 DIAGNOSIS — E782 Mixed hyperlipidemia: Secondary | ICD-10-CM | POA: Diagnosis not present

## 2017-12-18 ENCOUNTER — Inpatient Hospital Stay (HOSPITAL_COMMUNITY)
Admission: EM | Admit: 2017-12-18 | Discharge: 2017-12-30 | DRG: 038 | Disposition: A | Discharge status: Rehabilitation Facility | Payer: Medicare HMO | Attending: Internal Medicine | Admitting: Internal Medicine

## 2017-12-18 ENCOUNTER — Observation Stay (HOSPITAL_COMMUNITY): Payer: Medicare HMO

## 2017-12-18 ENCOUNTER — Observation Stay (HOSPITAL_BASED_OUTPATIENT_CLINIC_OR_DEPARTMENT_OTHER): Payer: Medicare HMO

## 2017-12-18 ENCOUNTER — Encounter (HOSPITAL_COMMUNITY): Payer: Self-pay

## 2017-12-18 ENCOUNTER — Emergency Department (HOSPITAL_COMMUNITY): Payer: Medicare HMO

## 2017-12-18 ENCOUNTER — Other Ambulatory Visit: Payer: Self-pay

## 2017-12-18 DIAGNOSIS — Z23 Encounter for immunization: Secondary | ICD-10-CM | POA: Diagnosis present

## 2017-12-18 DIAGNOSIS — I5189 Other ill-defined heart diseases: Secondary | ICD-10-CM

## 2017-12-18 DIAGNOSIS — N189 Chronic kidney disease, unspecified: Secondary | ICD-10-CM | POA: Diagnosis not present

## 2017-12-18 DIAGNOSIS — R29701 NIHSS score 1: Secondary | ICD-10-CM | POA: Diagnosis present

## 2017-12-18 DIAGNOSIS — R51 Headache: Secondary | ICD-10-CM | POA: Diagnosis not present

## 2017-12-18 DIAGNOSIS — R531 Weakness: Secondary | ICD-10-CM | POA: Diagnosis not present

## 2017-12-18 DIAGNOSIS — G20 Parkinson's disease: Secondary | ICD-10-CM | POA: Diagnosis present

## 2017-12-18 DIAGNOSIS — R402362 Coma scale, best motor response, obeys commands, at arrival to emergency department: Secondary | ICD-10-CM | POA: Diagnosis present

## 2017-12-18 DIAGNOSIS — I63412 Cerebral infarction due to embolism of left middle cerebral artery: Secondary | ICD-10-CM | POA: Diagnosis not present

## 2017-12-18 DIAGNOSIS — I340 Nonrheumatic mitral (valve) insufficiency: Secondary | ICD-10-CM | POA: Diagnosis not present

## 2017-12-18 DIAGNOSIS — M50322 Other cervical disc degeneration at C5-C6 level: Secondary | ICD-10-CM | POA: Diagnosis not present

## 2017-12-18 DIAGNOSIS — I639 Cerebral infarction, unspecified: Secondary | ICD-10-CM | POA: Diagnosis not present

## 2017-12-18 DIAGNOSIS — I4510 Unspecified right bundle-branch block: Secondary | ICD-10-CM | POA: Diagnosis present

## 2017-12-18 DIAGNOSIS — R569 Unspecified convulsions: Secondary | ICD-10-CM

## 2017-12-18 DIAGNOSIS — I129 Hypertensive chronic kidney disease with stage 1 through stage 4 chronic kidney disease, or unspecified chronic kidney disease: Secondary | ICD-10-CM | POA: Diagnosis present

## 2017-12-18 DIAGNOSIS — Z87440 Personal history of urinary (tract) infections: Secondary | ICD-10-CM

## 2017-12-18 DIAGNOSIS — I63132 Cerebral infarction due to embolism of left carotid artery: Principal | ICD-10-CM | POA: Diagnosis present

## 2017-12-18 DIAGNOSIS — Z96641 Presence of right artificial hip joint: Secondary | ICD-10-CM | POA: Diagnosis present

## 2017-12-18 DIAGNOSIS — N400 Enlarged prostate without lower urinary tract symptoms: Secondary | ICD-10-CM | POA: Diagnosis not present

## 2017-12-18 DIAGNOSIS — G40909 Epilepsy, unspecified, not intractable, without status epilepticus: Secondary | ICD-10-CM | POA: Diagnosis present

## 2017-12-18 DIAGNOSIS — I6389 Other cerebral infarction: Secondary | ICD-10-CM | POA: Diagnosis not present

## 2017-12-18 DIAGNOSIS — R0981 Nasal congestion: Secondary | ICD-10-CM | POA: Diagnosis not present

## 2017-12-18 DIAGNOSIS — R402252 Coma scale, best verbal response, oriented, at arrival to emergency department: Secondary | ICD-10-CM | POA: Diagnosis present

## 2017-12-18 DIAGNOSIS — Z7982 Long term (current) use of aspirin: Secondary | ICD-10-CM

## 2017-12-18 DIAGNOSIS — E669 Obesity, unspecified: Secondary | ICD-10-CM | POA: Diagnosis not present

## 2017-12-18 DIAGNOSIS — N179 Acute kidney failure, unspecified: Secondary | ICD-10-CM | POA: Diagnosis present

## 2017-12-18 DIAGNOSIS — E119 Type 2 diabetes mellitus without complications: Secondary | ICD-10-CM | POA: Diagnosis not present

## 2017-12-18 DIAGNOSIS — M1990 Unspecified osteoarthritis, unspecified site: Secondary | ICD-10-CM | POA: Diagnosis present

## 2017-12-18 DIAGNOSIS — E875 Hyperkalemia: Secondary | ICD-10-CM | POA: Diagnosis present

## 2017-12-18 DIAGNOSIS — G20A1 Parkinson's disease without dyskinesia, without mention of fluctuations: Secondary | ICD-10-CM

## 2017-12-18 DIAGNOSIS — E876 Hypokalemia: Secondary | ICD-10-CM

## 2017-12-18 DIAGNOSIS — N183 Chronic kidney disease, stage 3 (moderate): Secondary | ICD-10-CM | POA: Diagnosis not present

## 2017-12-18 DIAGNOSIS — N390 Urinary tract infection, site not specified: Secondary | ICD-10-CM | POA: Diagnosis present

## 2017-12-18 DIAGNOSIS — E1122 Type 2 diabetes mellitus with diabetic chronic kidney disease: Secondary | ICD-10-CM | POA: Diagnosis present

## 2017-12-18 DIAGNOSIS — R29810 Facial weakness: Secondary | ICD-10-CM | POA: Diagnosis present

## 2017-12-18 DIAGNOSIS — R7309 Other abnormal glucose: Secondary | ICD-10-CM | POA: Diagnosis not present

## 2017-12-18 DIAGNOSIS — I10 Essential (primary) hypertension: Secondary | ICD-10-CM | POA: Diagnosis present

## 2017-12-18 DIAGNOSIS — Z79899 Other long term (current) drug therapy: Secondary | ICD-10-CM

## 2017-12-18 DIAGNOSIS — Z8673 Personal history of transient ischemic attack (TIA), and cerebral infarction without residual deficits: Secondary | ICD-10-CM

## 2017-12-18 DIAGNOSIS — R402142 Coma scale, eyes open, spontaneous, at arrival to emergency department: Secondary | ICD-10-CM | POA: Diagnosis present

## 2017-12-18 DIAGNOSIS — I69351 Hemiplegia and hemiparesis following cerebral infarction affecting right dominant side: Secondary | ICD-10-CM | POA: Diagnosis not present

## 2017-12-18 DIAGNOSIS — R0989 Other specified symptoms and signs involving the circulatory and respiratory systems: Secondary | ICD-10-CM | POA: Diagnosis not present

## 2017-12-18 DIAGNOSIS — Z7984 Long term (current) use of oral hypoglycemic drugs: Secondary | ICD-10-CM | POA: Diagnosis not present

## 2017-12-18 DIAGNOSIS — D170 Benign lipomatous neoplasm of skin and subcutaneous tissue of head, face and neck: Secondary | ICD-10-CM | POA: Diagnosis not present

## 2017-12-18 DIAGNOSIS — N170 Acute kidney failure with tubular necrosis: Secondary | ICD-10-CM | POA: Diagnosis not present

## 2017-12-18 DIAGNOSIS — R0902 Hypoxemia: Secondary | ICD-10-CM | POA: Diagnosis not present

## 2017-12-18 DIAGNOSIS — E785 Hyperlipidemia, unspecified: Secondary | ICD-10-CM | POA: Diagnosis present

## 2017-12-18 DIAGNOSIS — I6522 Occlusion and stenosis of left carotid artery: Secondary | ICD-10-CM | POA: Diagnosis not present

## 2017-12-18 DIAGNOSIS — I63512 Cerebral infarction due to unspecified occlusion or stenosis of left middle cerebral artery: Secondary | ICD-10-CM | POA: Diagnosis not present

## 2017-12-18 DIAGNOSIS — E46 Unspecified protein-calorie malnutrition: Secondary | ICD-10-CM | POA: Diagnosis not present

## 2017-12-18 DIAGNOSIS — E1169 Type 2 diabetes mellitus with other specified complication: Secondary | ICD-10-CM | POA: Diagnosis not present

## 2017-12-18 DIAGNOSIS — I63413 Cerebral infarction due to embolism of bilateral middle cerebral arteries: Secondary | ICD-10-CM | POA: Diagnosis not present

## 2017-12-18 DIAGNOSIS — G8191 Hemiplegia, unspecified affecting right dominant side: Secondary | ICD-10-CM | POA: Diagnosis present

## 2017-12-18 DIAGNOSIS — Z6834 Body mass index (BMI) 34.0-34.9, adult: Secondary | ICD-10-CM | POA: Diagnosis not present

## 2017-12-18 DIAGNOSIS — E1165 Type 2 diabetes mellitus with hyperglycemia: Secondary | ICD-10-CM | POA: Diagnosis not present

## 2017-12-18 DIAGNOSIS — I6340 Cerebral infarction due to embolism of unspecified cerebral artery: Secondary | ICD-10-CM

## 2017-12-18 DIAGNOSIS — Z87442 Personal history of urinary calculi: Secondary | ICD-10-CM

## 2017-12-18 HISTORY — DX: Calculus of kidney: N20.0

## 2017-12-18 LAB — DIFFERENTIAL
Abs Immature Granulocytes: 0 10*3/uL (ref 0.0–0.1)
BASOS ABS: 0.1 10*3/uL (ref 0.0–0.1)
BASOS PCT: 1 %
EOS ABS: 0.3 10*3/uL (ref 0.0–0.7)
EOS PCT: 3 %
Immature Granulocytes: 0 %
Lymphocytes Relative: 16 %
Lymphs Abs: 1.2 10*3/uL (ref 0.7–4.0)
Monocytes Absolute: 0.5 10*3/uL (ref 0.1–1.0)
Monocytes Relative: 7 %
Neutro Abs: 5.7 10*3/uL (ref 1.7–7.7)
Neutrophils Relative %: 73 %

## 2017-12-18 LAB — RAPID URINE DRUG SCREEN, HOSP PERFORMED
AMPHETAMINES: NOT DETECTED
Barbiturates: NOT DETECTED
Benzodiazepines: NOT DETECTED
COCAINE: NOT DETECTED
OPIATES: NOT DETECTED
TETRAHYDROCANNABINOL: NOT DETECTED

## 2017-12-18 LAB — CBC
HEMATOCRIT: 50.5 % (ref 39.0–52.0)
HEMOGLOBIN: 16.7 g/dL (ref 13.0–17.0)
MCH: 32.2 pg (ref 26.0–34.0)
MCHC: 33.1 g/dL (ref 30.0–36.0)
MCV: 97.5 fL (ref 78.0–100.0)
Platelets: 163 10*3/uL (ref 150–400)
RBC: 5.18 MIL/uL (ref 4.22–5.81)
RDW: 12.7 % (ref 11.5–15.5)
WBC: 7.8 10*3/uL (ref 4.0–10.5)

## 2017-12-18 LAB — URINALYSIS, ROUTINE W REFLEX MICROSCOPIC
BILIRUBIN URINE: NEGATIVE
Glucose, UA: NEGATIVE mg/dL
KETONES UR: NEGATIVE mg/dL
Nitrite: POSITIVE — AB
Protein, ur: NEGATIVE mg/dL
Specific Gravity, Urine: 1.015 (ref 1.005–1.030)
pH: 5 (ref 5.0–8.0)

## 2017-12-18 LAB — COMPREHENSIVE METABOLIC PANEL
ALT: 14 U/L (ref 0–44)
ANION GAP: 11 (ref 5–15)
AST: 31 U/L (ref 15–41)
Albumin: 3.8 g/dL (ref 3.5–5.0)
Alkaline Phosphatase: 88 U/L (ref 38–126)
BUN: 32 mg/dL — ABNORMAL HIGH (ref 8–23)
CO2: 24 mmol/L (ref 22–32)
Calcium: 8.9 mg/dL (ref 8.9–10.3)
Chloride: 104 mmol/L (ref 98–111)
Creatinine, Ser: 1.72 mg/dL — ABNORMAL HIGH (ref 0.61–1.24)
GFR calc non Af Amer: 36 mL/min — ABNORMAL LOW (ref >60)
GFR, EST AFRICAN AMERICAN: 41 mL/min — AB (ref >60)
GLUCOSE: 233 mg/dL — AB (ref 70–99)
POTASSIUM: 5.4 mmol/L — AB (ref 3.5–5.1)
SODIUM: 139 mmol/L (ref 135–145)
Total Bilirubin: 1.5 mg/dL — ABNORMAL HIGH (ref 0.3–1.2)
Total Protein: 6.7 g/dL (ref 6.5–8.1)

## 2017-12-18 LAB — ETHANOL: Alcohol, Ethyl (B): <10 mg/dL (ref <10)

## 2017-12-18 LAB — I-STAT TROPONIN, ED: Troponin i, poc: 0.01 ng/mL (ref 0.00–0.08)

## 2017-12-18 LAB — GLUCOSE, CAPILLARY
Glucose-Capillary: 117 mg/dL — ABNORMAL HIGH (ref 70–99)
Glucose-Capillary: 133 mg/dL — ABNORMAL HIGH (ref 70–99)

## 2017-12-18 LAB — APTT: aPTT: 28 seconds (ref 24–36)

## 2017-12-18 LAB — PROTIME-INR
INR: 1.1
Prothrombin Time: 14.1 seconds (ref 11.4–15.2)

## 2017-12-18 MED ORDER — INSULIN ASPART 100 UNIT/ML ~~LOC~~ SOLN
0.0000 [IU] | Freq: Every day | SUBCUTANEOUS | Status: DC
  Administered 2017-12-19: 2 [IU] via SUBCUTANEOUS

## 2017-12-18 MED ORDER — SODIUM CHLORIDE 0.9 % IV SOLN
INTRAVENOUS | Status: DC
  Administered 2017-12-18 – 2017-12-19 (×2): via INTRAVENOUS

## 2017-12-18 MED ORDER — SENNOSIDES-DOCUSATE SODIUM 8.6-50 MG PO TABS: 1.0000 | ORAL_TABLET | Freq: Every evening | ORAL | Status: DC | PRN

## 2017-12-18 MED ORDER — SODIUM CHLORIDE 0.9 % IV BOLUS
1000.0000 mL | Freq: Once | INTRAVENOUS | Status: AC
  Administered 2017-12-18: 1000 mL via INTRAVENOUS

## 2017-12-18 MED ORDER — INFLUENZA VAC SPLIT HIGH-DOSE 0.5 ML IM SUSY
0.5000 mL | PREFILLED_SYRINGE | INTRAMUSCULAR | Status: DC
  Filled 2017-12-18: qty 0.5

## 2017-12-18 MED ORDER — TAMSULOSIN HCL 0.4 MG PO CAPS
0.4000 mg | ORAL_CAPSULE | Freq: Every day | ORAL | Status: DC
  Administered 2017-12-18 – 2017-12-29 (×12): 0.4 mg via ORAL
  Filled 2017-12-18 (×12): qty 1

## 2017-12-18 MED ORDER — ASPIRIN 81 MG PO CHEW
324.0000 mg | CHEWABLE_TABLET | Freq: Once | ORAL | Status: AC
  Administered 2017-12-18: 324 mg via ORAL
  Filled 2017-12-18: qty 4

## 2017-12-18 MED ORDER — INSULIN ASPART 100 UNIT/ML ~~LOC~~ SOLN
0.0000 [IU] | Freq: Three times a day (TID) | SUBCUTANEOUS | Status: DC
  Administered 2017-12-19: 1 [IU] via SUBCUTANEOUS
  Administered 2017-12-19 – 2017-12-20 (×2): 2 [IU] via SUBCUTANEOUS
  Administered 2017-12-20: 1 [IU] via SUBCUTANEOUS
  Administered 2017-12-20 – 2017-12-21 (×2): 3 [IU] via SUBCUTANEOUS
  Administered 2017-12-22: 2 [IU] via SUBCUTANEOUS
  Administered 2017-12-23 – 2017-12-25 (×6): 1 [IU] via SUBCUTANEOUS
  Administered 2017-12-25: 2 [IU] via SUBCUTANEOUS
  Administered 2017-12-26: 1 [IU] via SUBCUTANEOUS
  Administered 2017-12-26: 2 [IU] via SUBCUTANEOUS
  Administered 2017-12-27: 1 [IU] via SUBCUTANEOUS
  Administered 2017-12-27: 2 [IU] via SUBCUTANEOUS
  Administered 2017-12-27: 1 [IU] via SUBCUTANEOUS
  Administered 2017-12-28 (×2): 2 [IU] via SUBCUTANEOUS
  Administered 2017-12-28: 1 [IU] via SUBCUTANEOUS
  Administered 2017-12-29: 2 [IU] via SUBCUTANEOUS
  Administered 2017-12-29 (×2): 1 [IU] via SUBCUTANEOUS
  Administered 2017-12-30: 2 [IU] via SUBCUTANEOUS

## 2017-12-18 MED ORDER — STROKE: EARLY STAGES OF RECOVERY BOOK: Freq: Once | Status: DC

## 2017-12-18 MED ORDER — SODIUM CHLORIDE 0.9 % IV SOLN
1.0000 g | INTRAVENOUS | Status: AC
  Administered 2017-12-19 – 2017-12-23 (×4): 1 g via INTRAVENOUS
  Filled 2017-12-18 (×5): qty 10

## 2017-12-18 MED ORDER — HEPARIN SODIUM (PORCINE) 5000 UNIT/ML IJ SOLN
5000.0000 [IU] | Freq: Three times a day (TID) | INTRAMUSCULAR | Status: DC
  Administered 2017-12-18 – 2017-12-20 (×8): 5000 [IU] via SUBCUTANEOUS
  Filled 2017-12-18 (×7): qty 1

## 2017-12-18 MED ORDER — ACETAMINOPHEN 650 MG RE SUPP: 650.0000 mg | RECTAL | Status: DC | PRN

## 2017-12-18 MED ORDER — SIMVASTATIN 20 MG PO TABS
20.0000 mg | ORAL_TABLET | Freq: Every day | ORAL | Status: DC
  Administered 2017-12-19: 20 mg via ORAL
  Filled 2017-12-18: qty 1

## 2017-12-18 MED ORDER — SODIUM CHLORIDE 0.9 % IV SOLN
1.0000 g | Freq: Once | INTRAVENOUS | Status: AC
  Administered 2017-12-18: 1 g via INTRAVENOUS
  Filled 2017-12-18: qty 10

## 2017-12-18 MED ORDER — ASPIRIN EC 81 MG PO TBEC
81.0000 mg | DELAYED_RELEASE_TABLET | Freq: Every day | ORAL | Status: DC
  Administered 2017-12-19 – 2017-12-30 (×11): 81 mg via ORAL
  Filled 2017-12-18 (×11): qty 1

## 2017-12-18 MED ORDER — TAMSULOSIN HCL 0.4 MG PO CAPS: 0.4000 mg | ORAL_CAPSULE | ORAL | Status: DC

## 2017-12-18 MED ORDER — ACETAMINOPHEN 160 MG/5ML PO SOLN: 650.0000 mg | ORAL | Status: DC | PRN

## 2017-12-18 MED ORDER — FINASTERIDE 5 MG PO TABS
5.0000 mg | ORAL_TABLET | Freq: Every day | ORAL | Status: DC
  Administered 2017-12-19 – 2017-12-30 (×11): 5 mg via ORAL
  Filled 2017-12-18 (×11): qty 1

## 2017-12-18 MED ORDER — SODIUM POLYSTYRENE SULFONATE 15 GM/60ML PO SUSP
15.0000 g | Freq: Once | ORAL | Status: AC
  Administered 2017-12-18: 15 g via ORAL
  Filled 2017-12-18: qty 60

## 2017-12-18 MED ORDER — LEVETIRACETAM 500 MG PO TABS
500.0000 mg | ORAL_TABLET | Freq: Two times a day (BID) | ORAL | Status: DC
  Administered 2017-12-18 – 2017-12-30 (×23): 500 mg via ORAL
  Filled 2017-12-18 (×23): qty 1

## 2017-12-18 MED ORDER — ACETAMINOPHEN 325 MG PO TABS
650.0000 mg | ORAL_TABLET | ORAL | Status: DC | PRN
  Administered 2017-12-20: 650 mg via ORAL
  Filled 2017-12-18: qty 2

## 2017-12-18 NOTE — Consult Note (Signed)
REASON FOR CONSULT:    Greater than 80% symptomatic left carotid stenosis.  The consult is requested by Dr. Waldron Labs.  HPI:   [name] [name] is a pleasant 83 y.o. male, who was admitted this morning.  He developed the sudden onset of right-sided weakness involving his upper extremity and lower extremity this morning about 7 AM.  His work-up included a carotid duplex scan which showed a greater than 80% left carotid stenosis with no significant stenosis on the right.  The patient tells me that he awoke this morning and noted paresthesias in the right arm and also the right leg.  To a lesser degree he had some weakness in his right upper extremity and right lower extremity.  The symptoms have improved but not resolved completely.  He denies any associated expressive or receptive aphasia.  He has had no previous history of stroke or amaurosis fugax.  He states that he may have had a TIA a year ago but cannot remember the details.  Of note he is right-handed.  He was on aspirin and was on a statin prior to this event.  Past Medical History:  Diagnosis Date  . Arthritis   . Diabetes mellitus without complication ([place])    type 2  . Prostate disease   . Renal disorder    kidney stones  . Seizures ([place])     Family History  Problem Relation Age of Onset  . Early death Neg Hx   . Kidney disease Neg Hx     SOCIAL HISTORY: Social History   Socioeconomic History  . Marital status: Single    Spouse name: Not on file  . Number of children: Not on file  . Years of education: Not on file  . Highest education level: Not on file  Occupational History  . Not on file  Social Needs  . Financial resource strain: Not on file  . Food insecurity:    Worry: Not on file    Inability: Not on file  . Transportation needs:    Medical: Not on file    Non-medical: Not on file  Tobacco Use  . Smoking status: Never Smoker  . Smokeless tobacco: Never Used  Substance and Sexual Activity  . Alcohol  use: No  . Drug use: No  . Sexual activity: Not on file  Lifestyle  . Physical activity:    Days per week: Not on file    Minutes per session: Not on file  . Stress: Not on file  Relationships  . Social connections:    Talks on phone: Not on file    Gets together: Not on file    Attends religious service: Not on file    Active member of club or organization: Not on file    Attends meetings of clubs or organizations: Not on file    Relationship status: Not on file  . Intimate partner violence:    Fear of current or ex partner: Not on file    Emotionally abused: Not on file    Physically abused: Not on file    Forced sexual activity: Not on file  Other Topics Concern  . Not on file  Social History Narrative   Social History     Marital Status: Single              Spouse Name:                        Years  of Education:                 Number of children:  0              Occupational History     None on file      Social History Main Topics     Smoking Status: Never Smoker                       Smokeless Status: Not on file                        Alcohol Use: No               Drug Use: Not on file      Sexual Activity: Not on file            Other Topics            Concern     None on file      Social History Narrative   Patient lives in a 1 story house, 4 people presently live in the home. He does have 1 dog and cat has pets.             No Known Allergies  Current Facility-Administered Medications  Medication Dose Route Frequency Provider Last Rate Last Dose  .  stroke: mapping our early stages of recovery book   Does not apply Once Elgergawy, Silver Huguenin, MD      . 0.9 %  sodium chloride infusion   Intravenous Continuous Elgergawy, Silver Huguenin, MD 75 mL/hr at 12/18/17 1418    . acetaminophen (TYLENOL) tablet 650 mg  650 mg Oral Q4H PRN Elgergawy, Silver Huguenin, MD       Or  . acetaminophen (TYLENOL) solution 650 mg  650 mg Per Tube Q4H PRN Elgergawy, Silver Huguenin, MD       Or    . acetaminophen (TYLENOL) suppository 650 mg  650 mg Rectal Q4H PRN Elgergawy, Silver Huguenin, MD      . aspirin chewable tablet 324 mg  324 mg Oral Once Little, Wenda Overland, MD      . Derrill Memo ON 12/19/2017] aspirin EC tablet 81 mg  81 mg Oral Daily Elgergawy, Silver Huguenin, MD      . cefTRIAXone (ROCEPHIN) 1 g in sodium chloride 0.9 % 100 mL IVPB  1 g Intravenous Once Little, Wenda Overland, MD      . Derrill Memo ON 12/19/2017] cefTRIAXone (ROCEPHIN) 1 g in sodium chloride 0.9 % 100 mL IVPB  1 g Intravenous Q24H Elgergawy, Silver Huguenin, MD      . Derrill Memo ON 12/19/2017] finasteride (PROSCAR) tablet 5 mg  5 mg Oral Daily Elgergawy, Dawood S, MD      . heparin injection 5,000 Units  5,000 Units Subcutaneous Q8H Elgergawy, Dawood S, MD      . insulin aspart (novoLOG) injection 0-5 Units  0-5 Units Subcutaneous QHS Elgergawy, Dawood S, MD      . insulin aspart (novoLOG) injection 0-9 Units  0-9 Units Subcutaneous TID WC Elgergawy, Silver Huguenin, MD      . levETIRAcetam (KEPPRA) tablet 500 mg  500 mg Oral BID Elgergawy, Silver Huguenin, MD      . senna-docusate (Senokot-S) tablet 1 tablet  1 tablet Oral QHS PRN Elgergawy, Silver Huguenin, MD      . Derrill Memo ON 12/19/2017] simvastatin (ZOCOR) tablet 20 mg  20 mg Oral Daily Elgergawy,  Silver Huguenin, MD      . sodium chloride 0.9 % bolus 1,000 mL  1,000 mL Intravenous Once Little, Wenda Overland, MD      . sodium polystyrene (KAYEXALATE) 15 GM/60ML suspension 15 g  15 g Oral Once Elgergawy, Silver Huguenin, MD      . tamsulosin (FLOMAX) capsule 0.4 mg  0.4 mg Oral QPC supper Elgergawy, Silver Huguenin, MD        REVIEW OF SYSTEMS:  [X]  denotes positive finding, [ ]  denotes negative finding Cardiac  Comments:  Chest pain or chest pressure:    Shortness of breath upon exertion: x   Short of breath when lying flat:    Irregular heart rhythm:        Vascular    Pain in calf, thigh, or hip brought on by ambulation:    Pain in feet at night that wakes you up from your sleep:     Blood clot in your veins:    Leg  swelling:         Pulmonary    Oxygen at home:    Productive cough:     Wheezing:         Neurologic    Sudden weakness in arms or legs:  x   Sudden numbness in arms or legs:  x   Sudden onset of difficulty speaking or slurred speech:    Temporary loss of vision in one eye:     Problems with dizziness:         Gastrointestinal    Blood in stool:     Vomited blood:         Genitourinary    Burning when urinating:     Blood in urine:        Psychiatric    Major depression:         Hematologic    Bleeding problems:    Problems with blood clotting too easily:        Skin    Rashes or ulcers:        Constitutional    Fever or chills:     PHYSICAL EXAM:   Vitals:   12/18/17 1230 12/18/17 1245 12/18/17 1332 12/18/17 1345  BP: (!) 137/95 (!) 156/92  (!) 144/79  Pulse: 93 (!) 101  73  Resp: 17 19  16   Temp:    (!) 97.5 F (36.4 C)  TempSrc:    Oral  SpO2: 94% 98% 96% 96%  Weight:      Height:        GENERAL: The patient is a well-nourished male, in no acute distress. The vital signs are documented above. CARDIAC: There is a regular rate and rhythm.  VASCULAR: He has a left carotid bruit. He has palpable femoral pulses.  I cannot palpate pedal pulses however both feet appear adequately perfused. He has some mild bilateral lower extremity swelling and hyperpigmentation to suggest chronic venous insufficiency. PULMONARY: There is good air exchange bilaterally without wheezing or rales. ABDOMEN: Soft and non-tender with normal pitched bowel sounds.  I do not palpate an abdominal aortic aneurysm. MUSCULOSKELETAL: There are no major deformities or cyanosis. NEUROLOGIC: He has some mild weakness to grip in the right hand.  He has good strength in the left upper extremity.  He has good strength in both lower extremities. SKIN: There are no ulcers or rashes noted. PSYCHIATRIC: The patient has a normal affect.  DATA:    LABS: Creatinine is 1.72.  GFR is 36.  Hemoglobin is  16.7.  Platelets 163,000.  CT OF THE HEAD: I have reviewed the CT of the head that was done today.  Shows no acute abnormality.  CAROTID DUPLEX: I have independently interpreted his carotid duplex scan.  On the left side, there is a greater than 80% left carotid stenosis.  On the right side there is a less than 39% stenosis.  Both vertebral arteries are patent with antegrade flow.  2D ECHO: The results of his 2D echo are pending.  ASSESSMENT & PLAN:   SYMPTOMATIC LEFT CAROTID STENOSIS: This patient has a greater than 80% symptomatic left carotid stenosis.  I would recommend left carotid endarterectomy in order to lower his risk of future stroke.  I have discussed the indications for the procedure and the potential complications with the patient and his wife on the phone today.  I have explained that I want to be sure that neurology is in agreement with this plan.  If they are in agreement I could potentially proceed with a left carotid endarterectomy on Tuesday. He is on aspirin and is on a statin.  I have reviewed the indications for carotid endarterectomy, that is to lower the risk of future stroke. I have also reviewed the potential complications of surgery, including but not limited to: bleeding, stroke (perioperative risk 1-2%), MI, nerve injury of other unpredictable medical problems. All of the patients questions were answered and they are agreeable to proceed with surgery.   Deitra Mayo [facility] [phone]

## 2017-12-18 NOTE — H&P (Addendum)
TRH H&P   Patient Demographics:    [name] [name], is a 83 y.o. male  MRN: [id number]   DOB - April 12, 1934  Admit Date - 12/18/2017  Outpatient Primary MD for the patient is Clovia Cuff, MD  Referring MD/NP/PA: Dr Rex Kras  Patient coming from: Home  Chief Complaint  Patient presents with  . Weakness      HPI:    [name] [name]  is a 83 y.o. male, past medical history of recurrent UTIs, seizures, CVA, Parkinson disease, hypertension, CKD stage III, Beatties mellitus, presents with complaints of right-sided weakness him a he slept 11 PM yesterday night at usual state of health, he woke up this morning around 7 AM when he is to have right-sided weakness, arm and leg, as well he did report some tingling and numbness, he does report some headache as well, relates with cane at baseline, carry diagnosis of Parkinson disease and CVA in the past as well,. -ED blood pressure 153/90, his CBG was 233, CT head was with no acute CVA, creatinine higher than baseline at 1.7, I was called to admit for CVA evaluation    Review of systems:    In addition to the HPI above, No Fever-chills, No Headache, No changes with Vision or hearing, No problems swallowing food or Liquids, No Chest pain, Cough or Shortness of Breath, No Abdominal pain, No Nausea or Vommitting, Bowel movements are regular, No Blood in stool or Urine, No dysuria, No new skin rashes or bruises, No new joints pains-aches,  Reports right-sided weakness with tingling and numbness, reports currently significantly improved No recent weight gain or loss, No polyuria, polydypsia or polyphagia, No significant Mental Stressors.  A full 10 point Review of Systems was done, except as stated above, all other Review of Systems were negative.   With Past History of the following :    Past Medical History:  Diagnosis Date  . Arthritis     . Diabetes mellitus without complication ([place])    type 2  . Prostate disease   . Renal disorder    kidney stones  . Seizures ([place])       Past Surgical History:  Procedure Laterality Date  . CYSTOSCOPY W/ URETERAL STENT PLACEMENT Bilateral 08/10/2015   Procedure: CYSTOSCOPY WITH BILATERAL RETROGRADE PYELOGRAM/BILATERAL URETERAL STENT PLACEMENT;  Surgeon: Kathie Rhodes, MD;  Location: [facility];  Service: Urology;  Laterality: Bilateral;  . CYSTOSCOPY WITH RETROGRADE PYELOGRAM, URETEROSCOPY AND STENT PLACEMENT Bilateral 09/02/2015   Procedure: CYSTOSCOPY BILATERAL STENT REMOVAL BILATERAL RETROGRADE PYELOGRAM, BILATERAL URETEROSCOPY AND BILATERAL STENT PLACEMENT;  Surgeon: Kathie Rhodes, MD;  Location: [facility];  Service: Urology;  Laterality: Bilateral;  . HOLMIUM LASER APPLICATION Bilateral 07/10/621   Procedure: BILATERAL HOLMIUM LASER  LITHO ;  Surgeon: Kathie Rhodes, MD;  Location: [facility];  Service: Urology;  Laterality: Bilateral;  . JOINT REPLACEMENT Right 2007   hip, Dr. Alvan Dame  .  TONSILLECTOMY Bilateral    70 years ago      Social History:     Social History   Tobacco Use  . Smoking status: Never Smoker  . Smokeless tobacco: Never Used  Substance Use Topics  . Alcohol use: No     Lives -at home  Mobility -independent     Family History :     Family History  Problem Relation Age of Onset  . Early death Neg Hx   . Kidney disease Neg Hx       Home Medications:   Prior to Admission medications   Medication Sig Start Date End Date Taking? Authorizing Provider  aspirin 81 MG tablet Take 81 mg by mouth daily.    Yes [provider]  finasteride (PROSCAR) 5 MG tablet Take 5 mg by mouth daily. 11/24/17  Yes [provider]  ibuprofen (ADVIL,MOTRIN) 200 MG tablet Take 200 mg by mouth every 6 (six) hours as needed for headache or mild pain.   Yes [provider]  levETIRAcetam (KEPPRA) 500 MG tablet Take 1 tablet (500 mg total) by mouth 2 (two)  times daily. 08/10/16  Yes Drenda Freeze, MD  metFORMIN (GLUCOPHAGE) 1000 MG tablet Take 1 tablet (1,000 mg total) by mouth 2 (two) times daily with a meal. 12/30/16  Yes Lauree Chandler, NP  simvastatin (ZOCOR) 20 MG tablet Take 20 mg by mouth daily.    Yes [provider]  tamsulosin (FLOMAX) 0.4 MG CAPS capsule Take 1 capsule (0.4 mg total) by mouth daily after supper. Patient taking differently: Take 0.4 mg by mouth daily.  09/02/15  Yes Kathie Rhodes, MD  sitaGLIPtin (JANUVIA) 50 MG tablet Take 1 tablet (50 mg total) by mouth daily. Patient not taking: Reported on 12/18/2017 12/30/16   Lauree Chandler, NP     Allergies:    No Known Allergies   Physical Exam:   Vitals  Blood pressure (!) 156/92, pulse (!) 101, temperature 98.7 F (37.1 C), temperature source Oral, resp. rate 19, height 5\' 10"  (1.778 m), weight 102.1 kg, SpO2 98 %.    1. General well-developed male laying in bed in no apparent distress  2. Normal affect and insight, Not Suicidal or Homicidal, Awake Alert, Oriented X 3.  3. No F.N deficits, ALL C.Nerves Intact, Strength 5/5 all 4 extremities, Sensation intact all 4 extremities, Plantars down going.  4. Ears and Eyes appear Normal, Conjunctivae clear, PERRLA. Moist Oral Mucosa.  5. Supple Neck, No JVD, No cervical lymphadenopathy appriciated, No Carotid Bruits.  6. Symmetrical Chest wall movement, Good air movement bilaterally, CTAB.  7. RRR, No Gallops, Rubs or Murmurs, No Parasternal Heave.  8. Positive Bowel Sounds, Abdomen Soft, No tenderness, No organomegaly appriciated,No rebound -guarding or rigidity.  9.  No Cyanosis, Normal Skin Turgor, No Skin Rash or Bruise.  10. Good muscle tone,  joints appear normal , no effusions, Normal ROM.  having a ankle bracelet  11. No Palpable Lymph Nodes in Neck or Axillae     Data Review:    CBC Recent Labs  Lab 12/18/17 1026  WBC 7.8  HGB 16.7  HCT 50.5  PLT 163  MCV 97.5  MCH 32.2    MCHC 33.1  RDW 12.7  LYMPHSABS 1.2  MONOABS 0.5  EOSABS 0.3  BASOSABS 0.1   ------------------------------------------------------------------------------------------------------------------  Chemistries  Recent Labs  Lab 12/18/17 1026  NA 139  K 5.4*  CL 104  CO2 24  GLUCOSE 233*  BUN 32*  CREATININE 1.72*  CALCIUM 8.9  AST 31  ALT 14  ALKPHOS 88  BILITOT 1.5*   ------------------------------------------------------------------------------------------------------------------ estimated creatinine clearance is 41 mL/min (A) (by C-G formula based on SCr of 1.72 mg/dL (H)). ------------------------------------------------------------------------------------------------------------------ No results for input(s): TSH, T4TOTAL, T3FREE, THYROIDAB in the last 72 hours.  Invalid input(s): FREET3  Coagulation profile Recent Labs  Lab 12/18/17 1026  INR 1.10   ------------------------------------------------------------------------------------------------------------------- No results for input(s): DDIMER in the last 72 hours. -------------------------------------------------------------------------------------------------------------------  Cardiac Enzymes No results for input(s): CKMB, TROPONINI, MYOGLOBIN in the last 168 hours.  Invalid input(s): CK ------------------------------------------------------------------------------------------------------------------    Component Value Date/Time   BNP 64.1 12/21/2016 1730     ---------------------------------------------------------------------------------------------------------------  Urinalysis    Component Value Date/Time   COLORURINE YELLOW 12/18/2017 1211   APPEARANCEUR CLOUDY (A) 12/18/2017 1211   LABSPEC 1.015 12/18/2017 1211   PHURINE 5.0 12/18/2017 1211   GLUCOSEU NEGATIVE 12/18/2017 1211   HGBUR MODERATE (A) 12/18/2017 1211   BILIRUBINUR NEGATIVE 12/18/2017 1211   KETONESUR NEGATIVE 12/18/2017 1211    PROTEINUR NEGATIVE 12/18/2017 1211   UROBILINOGEN 0.2 03/14/2009 1129   NITRITE POSITIVE (A) 12/18/2017 1211   LEUKOCYTESUR LARGE (A) 12/18/2017 1211    ----------------------------------------------------------------------------------------------------------------   Imaging Results:    Ct Head Wo Contrast  Result Date: 12/18/2017 CLINICAL DATA:  Right-sided weakness and headache since waking up this morning. EXAM: CT HEAD WITHOUT CONTRAST TECHNIQUE: Contiguous axial images were obtained from the base of the skull through the vertex without intravenous contrast. COMPARISON:  Brain MR dated 12/22/2016 and head CT dated 12/21/2016. FINDINGS: Brain: Stable mildly enlarged ventricles and mildly to moderately enlarged subarachnoid spaces. Minimal patchy white matter low density in both cerebral hemispheres without significant change. Stable old left caudate lacunar infarct. No intracranial hemorrhage, mass lesion or CT evidence of acute infarction. Vascular: No hyperdense vessel or unexpected calcification. Skull: Normal. Negative for fracture or focal lesion. Sinuses/Orbits: No acute finding. Other: Again demonstrated is a right frontal scalp lipoma measuring 0.2 x 0.5 cm on image number 66 series 4. IMPRESSION: 1. No acute abnormality. 2. Stable mild atrophy, minimal chronic small vessel white matter ischemic changes in both cerebral hemispheres and old left caudate lacunar infarct. 3. Stable right frontal scalp lipoma. Electronically Signed   By: Claudie Revering M.D.   On: 12/18/2017 11:49    My personal review of EKG: Rhythm NSR, Rate  97 /min, QTc 498, recent had right bundle branch block and left saccular anterior block number no Acute ST changes   Assessment & Plan:    Principal Problem:   Right sided weakness Active Problems:   Hyperkalemia   Acute renal failure (HCC)   HTN (hypertension)   Diabetes mellitus, type 2 (HCC)   Hyperlipidemia associated with type 2 diabetes mellitus (HCC)    Seizures (HCC)   UTI (urinary tract infection)   Right-sided weakness -Patient woke up at 7 AM with right-sided weakness, reports upper and lower, currently appears almost subsided, this is most likely related to TIA versus CVA, he will be admitted for further work-up. -Allergy consulted by ED -We will obtain hemoglobin A1c, lipid panel, already received full dose aspirin in ED, continue with home dose statin, allow for permissive hypertension(not on antihypertensive meds at home at baseline), will monitor on telemetry, obtain 2D echo, will obtain MRI brain, MRA head(unclear yet if his left ankle bracelet compatible with MRI), will obtain a Doppler as well, patient already known to have 60% stenosis in the left carotid artery. Addendum: Carotid  Doppler came back significant for left ICA critical stenosis 80 to 99% stenosis, have discussed with vascular surgery Dr. Doren Custard who will evaluate the patient.  Diabetes mellitus -Hold oral meds and continue with insulin sliding scale during hospital stay  AKI on CKD stage III -Baseline creatinine 1.3-1.5, 1.7 today, continue with gentle hydration, and hold nephrotoxic medication  Hyperkalemia -Will give 1 dose of Kayexalate  Hyperlipidemia -Check lipid panel, continue with home dose statin  Seizure -Continue with home meds  UTI -Continue with Rocephin, follow on urine culture   DVT Prophylaxis Heparin - SCDs  AM Labs Ordered, also please review Full Orders  Family Communication: Admission, patients condition and plan of care including tests being ordered have been discussed with the patient who indicate understanding and agree with the plan and Code Status.  Code Status Full  Likely DC to  Home  Condition GUARDED    Consults called: Neuro, vascular  Admission status: observation  Time spent in minutes : 65 MINUTES   Phillips Climes M.D on 12/18/2017 at 1:27 PM  Between 7am to 7pm - Pager - [phone]. After 7pm go to  [email or web address] - password [facility]  [facility] - Office  [phone]

## 2017-12-18 NOTE — Progress Notes (Signed)
  Echocardiogram 2D Echocardiogram has been performed.  Darlina Sicilian M 12/18/2017, 3:18 PM

## 2017-12-18 NOTE — ED Notes (Signed)
Neurology provider in room at this time.

## 2017-12-18 NOTE — Progress Notes (Signed)
Spoke with RN Ester, she is aware that patient can not be scanned with Ankle Bracelet on. Bracelet will need to be removed prior to patient having MRI done and a police officer will need to be present. Typically the officer will come down with patient and remove monitor before patient goes into scanner and officer will wait with patient to put monitor back on once patient is done with exam before leaving department.

## 2017-12-18 NOTE — ED Provider Notes (Signed)
[facility] EMERGENCY DEPARTMENT Provider Note   CSN: [id number] Arrival date & time: 12/18/17  6387     History   Chief Complaint Chief Complaint  Patient presents with  . Weakness    HPI [name] [name] is a 83 y.o. male.  83yo M w/ PMH including T2DM, seizure d/o, kidney stones, HLD who presents with right-sided weakness.  The patient went to bed at 11 PM last night in his usual state of health.  This morning upon waking around 7 AM, he noticed right sided weakness and numbness involving his right arm and right leg.  He had an episode of weakness previously that was thought to be TIA but states he did not have any residual deficits from this episode.  He has been in his usual state of health with no fevers, vomiting, or recent illness.  He is compliant with his medications. No vision changes, CP, SOB, or abd pain. No alcohol or drug use. No recent head injury.  The history is provided by the patient.  Weakness     Past Medical History:  Diagnosis Date  . Arthritis   . Diabetes mellitus without complication ([place])    type 2  . Prostate disease   . Renal disorder    kidney stones  . Seizures [facility])     Patient Active Problem List   Diagnosis Date Noted  . Chronic venous insufficiency 05/05/2017  . .0... 12/21/2016  . Weakness 12/21/2016  . Contracture of muscle of right upper arm 12/21/2016  . UTI (urinary tract infection) 12/21/2016  . Seizures ([place])   . Left knee pain 09/06/2015  . Hyperlipidemia associated with type 2 diabetes mellitus ([place]) 09/06/2015  . Tremor 09/06/2015  . Urinary frequency 09/06/2015  . Acute urinary retention 08/12/2015  . Hyperkalemia 08/10/2015  . Acute renal failure ([place]) 08/10/2015  . Acute bilateral obstructive uropathy 08/10/2015  . HTN (hypertension) 08/10/2015  . Diabetes mellitus, type 2 ([place]) 08/10/2015    Past Surgical History:  Procedure Laterality Date  . CYSTOSCOPY W/ URETERAL STENT PLACEMENT Bilateral  08/10/2015   Procedure: CYSTOSCOPY WITH BILATERAL RETROGRADE PYELOGRAM/BILATERAL URETERAL STENT PLACEMENT;  Surgeon: Kathie Rhodes, MD;  Location: [facility];  Service: Urology;  Laterality: Bilateral;  . CYSTOSCOPY WITH RETROGRADE PYELOGRAM, URETEROSCOPY AND STENT PLACEMENT Bilateral 09/02/2015   Procedure: CYSTOSCOPY BILATERAL STENT REMOVAL BILATERAL RETROGRADE PYELOGRAM, BILATERAL URETEROSCOPY AND BILATERAL STENT PLACEMENT;  Surgeon: Kathie Rhodes, MD;  Location: [facility];  Service: Urology;  Laterality: Bilateral;  . HOLMIUM LASER APPLICATION Bilateral 5/64/3329   Procedure: BILATERAL HOLMIUM LASER  LITHO ;  Surgeon: Kathie Rhodes, MD;  Location: [facility];  Service: Urology;  Laterality: Bilateral;  . JOINT REPLACEMENT Right 2007   hip, Dr. Alvan Dame  . TONSILLECTOMY Bilateral    70 years ago        Home Medications    Prior to Admission medications   Medication Sig Start Date End Date Taking? Authorizing Provider  levETIRAcetam (KEPPRA) 500 MG tablet Take 1 tablet (500 mg total) by mouth 2 (two) times daily. 08/10/16   Drenda Freeze, MD  metFORMIN (GLUCOPHAGE) 1000 MG tablet Take 1 tablet (1,000 mg total) by mouth 2 (two) times daily with a meal. 12/30/16   Lauree Chandler, NP  simvastatin (ZOCOR) 20 MG tablet Take 20 mg by mouth daily.     [provider]  sitaGLIPtin (JANUVIA) 50 MG tablet Take 1 tablet (50 mg total) by mouth daily. 12/30/16   Lauree Chandler, NP  tamsulosin (FLOMAX) 0.4 MG CAPS capsule Take 1 capsule (0.4 mg total) by mouth daily after supper. Patient taking differently: Take 0.4 mg by mouth daily.  09/02/15   Kathie Rhodes, MD    Family History Family History  Problem Relation Age of Onset  . Early death Neg Hx   . Kidney disease Neg Hx     Social History Social History   Tobacco Use  . Smoking status: Never Smoker  . Smokeless tobacco: Never Used  Substance Use Topics  . Alcohol use: No  . Drug use: No     Allergies   Patient has no known  allergies.   Review of Systems Review of Systems  Neurological: Positive for weakness.   All other systems reviewed and are negative except that which was mentioned in HPI   Physical Exam Updated Vital Signs BP (!) 153/90 (BP Location: Right Arm)   Pulse 95   Temp 98.7 F (37.1 C) (Oral)   Resp 20   Ht 5\' 10"  (1.778 m)   Wt 102.1 kg   SpO2 96%   BMI 32.28 kg/m   Physical Exam  Constitutional: He is oriented to person, place, and time. He appears well-developed and well-nourished. No distress.  Awake, alert  HENT:  Head: Normocephalic and atraumatic.  Eyes: Pupils are equal, round, and reactive to light. Conjunctivae and EOM are normal.  Neck: Neck supple.  Cardiovascular: Normal rate, regular rhythm and normal heart sounds.  No murmur heard. Pulmonary/Chest: Effort normal and breath sounds normal. No respiratory distress.  Abdominal: Soft. Bowel sounds are normal. He exhibits no distension. There is no tenderness.  Musculoskeletal: He exhibits no edema.  Ankle monitor L ankle  Neurological: He is alert and oriented to person, place, and time. He has normal reflexes. No cranial nerve deficit.  Fluent speech, dysmetria R finger to nose testing, + right pronator drift, no clonus 5/5 strength and normal sensation LUE, RLE 4/5 strength RUE 2/5 strength RLE Diminished sensation RUE/RLE  Skin: Skin is warm and dry.  Psychiatric: He has a normal mood and affect. Judgment and thought content normal.  Nursing note and vitals reviewed.    ED Treatments / Results  Labs (all labs ordered are listed, but only abnormal results are displayed) Labs Reviewed  COMPREHENSIVE METABOLIC PANEL - Abnormal; Notable for the following components:      Result Value   Potassium 5.4 (*)    Glucose, Bld 233 (*)    BUN 32 (*)    Creatinine, Ser 1.72 (*)    Total Bilirubin 1.5 (*)    GFR calc non Af Amer 36 (*)    GFR calc Af Amer 41 (*)    All other components within normal limits    URINALYSIS, ROUTINE W REFLEX MICROSCOPIC - Abnormal; Notable for the following components:   APPearance CLOUDY (*)    Hgb urine dipstick MODERATE (*)    Nitrite POSITIVE (*)    Leukocytes, UA LARGE (*)    WBC, UA >50 (*)    Bacteria, UA MANY (*)    All other components within normal limits  ETHANOL  PROTIME-INR  APTT  CBC  DIFFERENTIAL  RAPID URINE DRUG SCREEN, HOSP PERFORMED  I-STAT TROPONIN, ED    EKG EKG Interpretation  Date/Time:  Saturday December 18 2017 10:01:04 EDT Ventricular Rate:  97 PR Interval:    QRS Duration: 176 QT Interval:  392 QTC Calculation: 498 R Axis:   -110 Text Interpretation:  Sinus rhythm RBBB and LAFB  similar to previous Confirmed by Theotis Burrow [phone]) on 12/18/2017 10:18:15 AM   Radiology Ct Head Wo Contrast  Result Date: 12/18/2017 CLINICAL DATA:  Right-sided weakness and headache since waking up this morning. EXAM: CT HEAD WITHOUT CONTRAST TECHNIQUE: Contiguous axial images were obtained from the base of the skull through the vertex without intravenous contrast. COMPARISON:  Brain MR dated 12/22/2016 and head CT dated 12/21/2016. FINDINGS: Brain: Stable mildly enlarged ventricles and mildly to moderately enlarged subarachnoid spaces. Minimal patchy white matter low density in both cerebral hemispheres without significant change. Stable old left caudate lacunar infarct. No intracranial hemorrhage, mass lesion or CT evidence of acute infarction. Vascular: No hyperdense vessel or unexpected calcification. Skull: Normal. Negative for fracture or focal lesion. Sinuses/Orbits: No acute finding. Other: Again demonstrated is a right frontal scalp lipoma measuring 0.2 x 0.5 cm on image number 66 series 4. IMPRESSION: 1. No acute abnormality. 2. Stable mild atrophy, minimal chronic small vessel white matter ischemic changes in both cerebral hemispheres and old left caudate lacunar infarct. 3. Stable right frontal scalp lipoma. Electronically Signed   By:  Claudie Revering M.D.   On: 12/18/2017 11:49    Procedures Procedures (including critical care time)  Medications Ordered in ED Medications - No data to display   Initial Impression / Assessment and Plan / ED Course  I have reviewed the triage vital signs and the nursing notes.  Pertinent labs & imaging results that were available during my care of the patient were reviewed by me and considered in my medical decision making (see chart for details).     Alert, GCS 15, stable VS. R sided weakness but LVO negative. Outside of window for tPA as LSN is 11 hours ago.  Lab work notable for AKI with creatinine 1.7, potassium 5.4, UA consistent with infection.  I have ordered urine culture, ceftriaxone, and IV fluid bolus.  Head CT shows evidence of old infarct but no hemorrhage or acute changes.  I do suspect ischemic stroke today.  I have contacted Dr. Leonel Ramsay he will see the patient in consultation.  Discussed admission with hospitalist, Dr. Waldron Labs, and pt admitted for further care.  Final Clinical Impressions(s) / ED Diagnoses   Final diagnoses:  None    ED Discharge Orders    None       Coda Filler, Wenda Overland, MD 12/18/17 1317

## 2017-12-18 NOTE — ED Triage Notes (Signed)
Upon waking at 0700 today patient noticed right-sided weakness in right hand and right leg with numbness on right side as well. Last seen normal at 2300 10/4.

## 2017-12-18 NOTE — Plan of Care (Signed)
  Problem: Education: Goal: Knowledge of General Education information will improve Description Including pain rating scale, medication(s)/side effects and non-pharmacologic comfort measures Outcome: Progressing   Problem: Health Behavior/Discharge Planning: Goal: Ability to manage health-related needs will improve Outcome: Progressing   Problem: Clinical Measurements: Goal: Ability to maintain clinical measurements within normal limits will improve Outcome: Progressing Goal: Will remain free from infection Outcome: Progressing   Problem: Activity: Goal: Risk for activity intolerance will decrease Outcome: Progressing   Problem: Nutrition: Goal: Adequate nutrition will be maintained Outcome: Progressing   Problem: Elimination: Goal: Will not experience complications related to bowel motility Outcome: Progressing Goal: Will not experience complications related to urinary retention Outcome: Progressing   Problem: Safety: Goal: Ability to remain free from injury will improve Outcome: Progressing   Problem: Skin Integrity: Goal: Risk for impaired skin integrity will decrease Outcome: Progressing   Problem: Education: Goal: Knowledge of disease or condition will improve Outcome: Progressing Goal: Knowledge of secondary prevention will improve Outcome: Progressing Goal: Knowledge of patient specific risk factors addressed and post discharge goals established will improve Outcome: Progressing   Problem: Health Behavior/Discharge Planning: Goal: Ability to manage health-related needs will improve Outcome: Progressing   Problem: Self-Care: Goal: Ability to participate in self-care as condition permits will improve Outcome: Progressing Goal: Ability to communicate needs accurately will improve Outcome: Progressing   Problem: Nutrition: Goal: Risk of aspiration will decrease Outcome: Progressing

## 2017-12-18 NOTE — Progress Notes (Addendum)
Preliminary notes--Bilateral carotid duplex exam completed. Right ICA 1-39% stenosis, Left ICA critical stenosis which is 80-99% stenosis. Bilateral vertebral arteries patent with antegrade flow.  Result notified RN Jackelyn Poling.  Paged Dr. Scot Dock.  [name] [name] (RDMS RVT) 12/18/17 3:01 PM

## 2017-12-18 NOTE — Consult Note (Addendum)
NEURO HOSPITALIST  CONSULT   Requesting Physician: Dr. Rex Kras    Chief Complaint: right side weakness and numbness  History obtained from:  Patient    HPI:                                                                                                                                         LINFORD QUINTELA is an 83 y.o. male  With PMH of seizure, DM2 who presented to [facility] ED with right sided weakness.   patient states that he went to bed at 11pm last night in his usual state of health. Woke up in the middle of the night to urinate in his jug about 0200 and he was normal. this morning about 0700 when he woke up he  noticed that his right side felt weak and the right arm and leg had some numbness. The numbness ha improved but still persist. Patient endorses having head a light HA that resolved with 1 dose of Advil. No prior stroke history, but states he has had weakness before and it was thought to be a TIA about a year ago. Patient walks with a walker or cane at baseline. Denies any vision changes, CP, SOB, ETOH, drug abuse, falls. Of note: patient reports that his left arm resting tremor is most likely parkinson's. He has been told that he has parkinson's symptoms. Denies missing any doses of ASA 81 mg or keppra.   Ed course: BP:153/90, BG: 233, Bun 32, creatinine: 1.72 CT head: no acute abnormality, old left lacunar infarct.   Date last known well: Date: 12/18/2017 Time last known well: Time: 02:00 tPA Given: No: outside of window  Modified Rankin: Rankin Score=2 NIHSS:1, right facial droop   Past Medical History:  Diagnosis Date  . Arthritis   . Diabetes mellitus without complication ([place])    type 2  . Prostate disease   . Renal disorder    kidney stones  . Seizures ([place])     Past Surgical History:  Procedure Laterality Date  . CYSTOSCOPY W/ URETERAL STENT PLACEMENT Bilateral 08/10/2015   Procedure: CYSTOSCOPY WITH BILATERAL  RETROGRADE PYELOGRAM/BILATERAL URETERAL STENT PLACEMENT;  Surgeon: Kathie Rhodes, MD;  Location: [facility];  Service: Urology;  Laterality: Bilateral;  . CYSTOSCOPY WITH RETROGRADE PYELOGRAM, URETEROSCOPY AND STENT PLACEMENT Bilateral 09/02/2015   Procedure: CYSTOSCOPY BILATERAL STENT REMOVAL BILATERAL RETROGRADE PYELOGRAM, BILATERAL URETEROSCOPY AND BILATERAL STENT PLACEMENT;  Surgeon: Kathie Rhodes, MD;  Location: [facility];  Service: Urology;  Laterality: Bilateral;  . HOLMIUM LASER APPLICATION Bilateral 7/32/2025   Procedure: BILATERAL HOLMIUM LASER  LITHO ;  Surgeon: Kathie Rhodes, MD;  Location: [facility];  Service: Urology;  Laterality: Bilateral;  . JOINT REPLACEMENT Right 2007   hip, Dr. Alvan Dame  . TONSILLECTOMY Bilateral    70 years ago    Family History  Problem Relation Age of Onset  . Early death Neg Hx   . Kidney disease Neg Hx        Social History:  reports that he has never smoked. He has never used smokeless tobacco. He reports that he does not drink alcohol or use drugs.  Allergies: No Known Allergies  Medications:                                                                                                                           No current facility-administered medications for this encounter.    Current Outpatient Medications  Medication Sig Dispense Refill  . aspirin 81 MG tablet Take 81 mg by mouth daily.     . finasteride (PROSCAR) 5 MG tablet Take 5 mg by mouth daily.    Marland Kitchen ibuprofen (ADVIL,MOTRIN) 200 MG tablet Take 200 mg by mouth every 6 (six) hours as needed for headache or mild pain.    Marland Kitchen levETIRAcetam (KEPPRA) 500 MG tablet Take 1 tablet (500 mg total) by mouth 2 (two) times daily. 60 tablet 0  . metFORMIN (GLUCOPHAGE) 1000 MG tablet Take 1 tablet (1,000 mg total) by mouth 2 (two) times daily with a meal. 15 tablet 0  . simvastatin (ZOCOR) 20 MG tablet Take 20 mg by mouth daily.     . tamsulosin (FLOMAX) 0.4 MG CAPS capsule Take 1 capsule (0.4 mg total) by mouth  daily after supper. (Patient taking differently: Take 0.4 mg by mouth daily. ) 30 capsule 0  . sitaGLIPtin (JANUVIA) 50 MG tablet Take 1 tablet (50 mg total) by mouth daily. (Patient not taking: Reported on 12/18/2017) 10 tablet 0     ROS:                                                                                                                                       ROS was performed and is negative except as noted in HPI  General Examination:  Blood pressure 125/81, pulse 93, temperature 98.7 F (37.1 C), temperature source Oral, resp. rate (!) 21, height 5\' 10"  (1.778 m), weight 102.1 kg, SpO2 92 %.  HEENT-  Normocephalic, no lesions, without obvious abnormality.  Normal external eye and conjunctiva.  Cardiovascular- S1-S2 audible, pulses palpable throughout   Lungs-no rhonchi or wheezing noted, no excessive working breathing.  Saturations within normal limits on RA Abdomen-BS present x 4 quadrants Extremities- Warm, dry and intact Musculoskeletal- left foot with 2nd and 3rd toe fusion, left hand with resting tremor that stops with action or when touched. Skin-warm and dry, Bilateral feet with shiny red patches  Neurological Examination Mental Status: Alert, oriented, thought content appropriate. Naming intact, Speech fluent without evidence of aphasia.  Able to follow  commands without difficulty. Cranial Nerves: II:  Visual fields grossly normal,  III,IV, VI: ptosis not present, extra-ocular motions intact bilaterally, pupils equal, round, reactive to light and accommodation V,VII: smile asymmetric, slight right facial droop, facial light touch sensation normal bilaterally VIII: hearing normal bilaterally IX,X: uvula rises symmetrically XI: bilateral shoulder shrug XII: midline tongue extension Motor: Right : Upper extremity   5/5 Left:     Upper extremity   5/5 Lower extremity    5/5   Lower extremity   5/5 Tone and bulk:normal tone throughout; no atrophy noted Sensory: pinprick and  light touch intact throughout, bilaterally Deep Tendon Reflexes: 1+ and symmetric biceps and patella. No ankle jerk Plantars: Right: downgoing   Left: downgoing Gait: deferred   Lab Results: Basic Metabolic Panel: Recent Labs  Lab 12/18/17 1026  NA 139  K 5.4*  CL 104  CO2 24  GLUCOSE 233*  BUN 32*  CREATININE 1.72*  CALCIUM 8.9    CBC: Recent Labs  Lab 12/18/17 1026  WBC 7.8  NEUTROABS 5.7  HGB 16.7  HCT 50.5  MCV 97.5  PLT 163   Imaging: Ct Head Wo Contrast  Result Date: 12/18/2017 CLINICAL DATA:  Right-sided weakness and headache since waking up this morning. EXAM: CT HEAD WITHOUT CONTRAST TECHNIQUE: Contiguous axial images were obtained from the base of the skull through the vertex without intravenous contrast. COMPARISON:  Brain MR dated 12/22/2016 and head CT dated 12/21/2016. FINDINGS: Brain: Stable mildly enlarged ventricles and mildly to moderately enlarged subarachnoid spaces. Minimal patchy white matter low density in both cerebral hemispheres without significant change. Stable old left caudate lacunar infarct. No intracranial hemorrhage, mass lesion or CT evidence of acute infarction. Vascular: No hyperdense vessel or unexpected calcification. Skull: Normal. Negative for fracture or focal lesion. Sinuses/Orbits: No acute finding. Other: Again demonstrated is a right frontal scalp lipoma measuring 0.2 x 0.5 cm on image number 66 series 4. IMPRESSION: 1. No acute abnormality. 2. Stable mild atrophy, minimal chronic small vessel white matter ischemic changes in both cerebral hemispheres and old left caudate lacunar infarct. 3. Stable right frontal scalp lipoma. Electronically Signed   By: Claudie Revering M.D.   On: 12/18/2017 11:49      Laurey Morale, MSN, NP-C [facility] [phone]   12/18/2017, 11:26 AM   Attending physician note to  follow with Assessment and plan . I have seen the patient and reviewed the above note.  He has significant ataxia in the right arm and leg.  I  Assessment: 83 y.o. male   With PMH of seizure, DM2 who presented to [facility] ED with right sided ataxia and numbness.  I suspect that he has had a small brainstem or cerebellar infarct.  Impression: Stroke Risk Factors - diabetes mellitus     Recommendations: --MRI Brain  --MRA of the head w/o and neck with contrast --Echocardiogram -- ASA 81 mg -- High intensity Statin if LDL > 70  -- HgbA1c, fasting lipid panel -- PT consult, OT consult, Speech consult --Telemetry monitoring --Frequent neuro checks --Stroke swallow screen  --please page stroke NP  Or  PA  Or MD from 8am -4 pm  as this patient from this time will be  followed by the stroke.   You can look them up on [email or web address]  Password [email or web address]  Roland Rack, MD [facility] [phone]  If 7pm- 7am, please page neurology on call as listed in [place].

## 2017-12-19 ENCOUNTER — Observation Stay (HOSPITAL_COMMUNITY): Payer: Medicare HMO

## 2017-12-19 DIAGNOSIS — N170 Acute kidney failure with tubular necrosis: Secondary | ICD-10-CM

## 2017-12-19 DIAGNOSIS — I6389 Other cerebral infarction: Secondary | ICD-10-CM | POA: Diagnosis not present

## 2017-12-19 LAB — LIPID PANEL
CHOL/HDL RATIO: 4.2 ratio
Cholesterol: 137 mg/dL (ref 0–200)
HDL: 33 mg/dL — AB (ref >40)
LDL CALC: 81 mg/dL (ref 0–99)
Triglycerides: 116 mg/dL (ref <150)
VLDL: 23 mg/dL (ref 0–40)

## 2017-12-19 LAB — HEMOGLOBIN A1C
Hgb A1c MFr Bld: 7.2 % — ABNORMAL HIGH (ref 4.8–5.6)
Mean Plasma Glucose: 159.94 mg/dL

## 2017-12-19 LAB — ECHOCARDIOGRAM COMPLETE
Height: 70 in
Weight: 3600 oz

## 2017-12-19 LAB — BASIC METABOLIC PANEL
Anion gap: 13 (ref 5–15)
BUN: 23 mg/dL (ref 8–23)
CHLORIDE: 104 mmol/L (ref 98–111)
CO2: 21 mmol/L — AB (ref 22–32)
CREATININE: 1.5 mg/dL — AB (ref 0.61–1.24)
Calcium: 9 mg/dL (ref 8.9–10.3)
GFR calc Af Amer: 49 mL/min — ABNORMAL LOW (ref >60)
GFR calc non Af Amer: 42 mL/min — ABNORMAL LOW (ref >60)
Glucose, Bld: 179 mg/dL — ABNORMAL HIGH (ref 70–99)
POTASSIUM: 3.4 mmol/L — AB (ref 3.5–5.1)
SODIUM: 138 mmol/L (ref 135–145)

## 2017-12-19 LAB — GLUCOSE, CAPILLARY
GLUCOSE-CAPILLARY: 112 mg/dL — AB (ref 70–99)
Glucose-Capillary: 140 mg/dL — ABNORMAL HIGH (ref 70–99)
Glucose-Capillary: 189 mg/dL — ABNORMAL HIGH (ref 70–99)
Glucose-Capillary: 203 mg/dL — ABNORMAL HIGH (ref 70–99)

## 2017-12-19 MED ORDER — SIMVASTATIN 40 MG PO TABS
40.0000 mg | ORAL_TABLET | Freq: Every day | ORAL | Status: DC
  Administered 2017-12-20 – 2017-12-30 (×10): 40 mg via ORAL
  Filled 2017-12-19 (×10): qty 1

## 2017-12-19 NOTE — Evaluation (Signed)
Physical Therapy Evaluation Patient Details Name: [name] [name] MRN: [id number] DOB: 1934-10-03 Today's Date: 12/19/2017   History of Present Illness  Pt is a 83 y.o. male, past medical history of recurrent UTIs, seizures, CVA, Parkinson disease, hypertension, CKD stage III, Beatties mellitus, presents with complaints of right-sided weakness, numbness and tingling, and headache. Carotid doppler positive for signifincat left ICA critical stensosis 80-99%, plan for L carotid endarterectomy Tuesday, CT negative, MRI scheduled.   Clinical Impression  Orders received for PT evaluation. Patient demonstrates deficits in functional mobility as indicated below. Will benefit from continued skilled PT to address deficits and maximize function. Will see as indicated and progress as tolerated.  Prior to admission, patient did require some assist with functional tasks due to prior CVA deficits, but was managing mobility with use of a SPC and was able to drive and mobilize in the community. At this time, patient requires hands on physical assist for bed level and ambulation activities. Given current deficits feel patient would benefit from comprehensive inpatient therapies to address deficits and maximize recovery of function. Recommend CIR consult.    Follow Up Recommendations CIR    Equipment Recommendations       Recommendations for Other Services Rehab consult     Precautions / Restrictions Precautions Precautions: Fall;Other (comment) Precaution Comments: L ankle monitor  Restrictions Weight Bearing Restrictions: No      Mobility  Bed Mobility Overal bed mobility: Needs Assistance Bed Mobility: Supine to Sit     Supine to sit: Mod assist;HOB elevated     General bed mobility comments: mod assist for trunk support into sitting at EOB, verbal cueing to reach and use rails   Transfers Overall transfer level: Needs assistance Equipment used: 1 person hand held assist Transfers: Sit  to/from Stand Sit to Stand: Min guard;+2 safety/equipment         General transfer comment: cueing for hand placement and safety, reliant on UE support  and increased time and effort to perform  Ambulation/Gait Ambulation/Gait assistance: Mod assist Gait Distance (Feet): 12 Feet Assistive device: 1 person hand held assist Gait Pattern/deviations: Decreased stride length;Shuffle;Trunk flexed;Wide base of support Gait velocity: decreased Gait velocity interpretation: <1.31 ft/sec, indicative of household ambulator General Gait Details: patient with difficulty establishing gait. Noted instability requiring moderate HHA to maintain upright and pacing. Patient reaching out for upright support of opposite extremity  Stairs            Wheelchair Mobility    Modified Rankin (Stroke Patients Only) Modified Rankin (Stroke Patients Only) Pre-Morbid Rankin Score: No significant disability Modified Rankin: Moderately severe disability     Balance Overall balance assessment: Needs assistance Sitting-balance support: Feet supported;No upper extremity supported Sitting balance-Leahy Scale: Poor(to fair with increased time) Sitting balance - Comments: static sitting, limited dynamic    Standing balance support: Bilateral upper extremity supported;During functional activity Standing balance-Leahy Scale: Poor Standing balance comment: relaint on B UE support                             Pertinent Vitals/Pain Pain Assessment: No/denies pain    Home Living Family/patient expects to be discharged to:: Private residence Living Arrangements: Spouse/significant other;Children Available Help at Discharge: Family Type of Home: House Home Access: Stairs to enter Entrance Stairs-Rails: Can reach both Entrance Stairs-Number of Steps: 7 Home Layout: One level Home Equipment: Environmental consultant - 2 wheels;Walker - 4 wheels;Cane - single point;Bedside commode;Shower seat;Grab bars -  tub/shower      Prior Function Level of Independence: Needs assistance   Gait / Transfers Assistance Needed: ambulated with cane  ADL's / Homemaking Assistance Needed: linda (wife) helps with the showering, cooks but patient reports self dresses(reports linda completes all bathing, pt holding onto grabbar)        Hand Dominance   Dominant Hand: Right    Extremity/Trunk Assessment   Upper Extremity Assessment Upper Extremity Assessment: RUE deficits/detail;LUE deficits/detail RUE Deficits / Details: Shoulder FF to 45, limited by hypertonicity in shoulder; brunstrom level V.  grossly 3+/5 MMT, able to use a gross stabilizer  RUE Sensation: decreased light touch;decreased proprioception RUE Coordination: decreased fine motor;decreased gross motor LUE Deficits / Details: shoulder FF to 90, noted resting tremor; grossly 4/5 MMT  LUE Sensation: WNL LUE Coordination: WNL    Lower Extremity Assessment Lower Extremity Assessment: RLE deficits/detail RLE Deficits / Details: modest assymetrical weakness, and liited functional strength during mobility RLE Coordination: decreased gross motor    Cervical / Trunk Assessment Cervical / Trunk Assessment: Kyphotic  Communication   Communication: No difficulties  Cognition Arousal/Alertness: Awake/alert Behavior During Therapy: WFL for tasks assessed/performed Overall Cognitive Status: Impaired/Different from baseline Area of Impairment: Attention;Memory;Following commands;Safety/judgement;Awareness;Problem solving                   Current Attention Level: Selective Memory: Decreased short-term memory Following Commands: Follows one step commands consistently;Follows one step commands with increased time Safety/Judgement: Decreased awareness of safety;Decreased awareness of deficits Awareness: Emergent Problem Solving: Slow processing;Decreased initiation;Difficulty sequencing;Requires verbal cues        General Comments       Exercises     Assessment/Plan    PT Assessment Patient needs continued PT services  PT Problem List Decreased strength;Decreased activity tolerance;Decreased balance;Decreased mobility;Decreased coordination;Decreased cognition;Decreased safety awareness       PT Treatment Interventions DME instruction;Functional mobility training;Therapeutic activities;Therapeutic exercise;Balance training;Stair training;Gait training;Neuromuscular re-education;Cognitive remediation;Patient/family education    PT Goals (Current goals can be found in the Care Plan section)  Acute Rehab PT Goals Patient Stated Goal: to get home  PT Goal Formulation: With patient Time For Goal Achievement: 01/02/18 Potential to Achieve Goals: Good    Frequency Min 4X/week   Barriers to discharge        Co-evaluation PT/OT/SLP Co-Evaluation/Treatment: Yes Reason for Co-Treatment: Complexity of the patient's impairments (multi-system involvement) PT goals addressed during session: Mobility/safety with mobility OT goals addressed during session: ADL's and self-care;Other (comment)(transfers )       AM-PAC PT "6 Clicks" Daily Activity  Outcome Measure Difficulty turning over in bed (including adjusting bedclothes, sheets and blankets)?: Unable Difficulty moving from lying on back to sitting on the side of the bed? : Unable Difficulty sitting down on and standing up from a chair with arms (e.g., wheelchair, bedside commode, etc,.)?: Unable Help needed moving to and from a bed to chair (including a wheelchair)?: A Little Help needed walking in hospital room?: A Lot Help needed climbing 3-5 steps with a railing? : A Lot 6 Click Score: 10    End of Session Equipment Utilized During Treatment: Gait belt Activity Tolerance: Patient tolerated treatment well Patient left: in chair;with call bell/phone within reach;with chair alarm set Nurse Communication: Mobility status PT Visit Diagnosis: Other symptoms and  signs involving the nervous system (R29.898);Difficulty in walking, not elsewhere classified (R26.2)    Time: 2706-2376 PT Time Calculation (min) (ACUTE ONLY): 27 min   Charges:   PT Evaluation $PT Eval Moderate  Complexity: [place], PT DPT  Board Certified Neurologic Specialist Acute Rehabilitation Services Pager [phone] Office [phone]   Duncan Dull 12/19/2017, 9:05 AM

## 2017-12-19 NOTE — Consult Note (Signed)
Reason for Consult: Right-sided weakness Referring Physician: Neurology  [name] [name] is an 83 y.o. male.  HPI: 83 year old male admitted with right-sided weakness.  Symptoms became apparent rather suddenly.  No history of trauma or injury.  Patient noted improvement of his symptoms since admission.  He still has some mild right-sided weakness..  Work-up is demonstrated evidence of significant carotid stenosis on the left side.  The patient has no radiating pain, numbness or weakness in any type of dermatomal distribution.  He has no significant left-sided symptoms.  He has no bowel or bladder dysfunction.  No history of neck pain. Past Medical History:  Diagnosis Date  . Arthritis   . Diabetes mellitus without complication ([place])    type 2  . Prostate disease   . Renal disorder    kidney stones  . Seizures ([place])     Past Surgical History:  Procedure Laterality Date  . CYSTOSCOPY W/ URETERAL STENT PLACEMENT Bilateral 08/10/2015   Procedure: CYSTOSCOPY WITH BILATERAL RETROGRADE PYELOGRAM/BILATERAL URETERAL STENT PLACEMENT;  Surgeon: Kathie Rhodes, MD;  Location: [facility];  Service: Urology;  Laterality: Bilateral;  . CYSTOSCOPY WITH RETROGRADE PYELOGRAM, URETEROSCOPY AND STENT PLACEMENT Bilateral 09/02/2015   Procedure: CYSTOSCOPY BILATERAL STENT REMOVAL BILATERAL RETROGRADE PYELOGRAM, BILATERAL URETEROSCOPY AND BILATERAL STENT PLACEMENT;  Surgeon: Kathie Rhodes, MD;  Location: [facility];  Service: Urology;  Laterality: Bilateral;  . HOLMIUM LASER APPLICATION Bilateral 5/95/6387   Procedure: BILATERAL HOLMIUM LASER  LITHO ;  Surgeon: Kathie Rhodes, MD;  Location: [facility];  Service: Urology;  Laterality: Bilateral;  . JOINT REPLACEMENT Right 2007   hip, Dr. Alvan Dame  . TONSILLECTOMY Bilateral    70 years ago    Family History  Problem Relation Age of Onset  . Early death Neg Hx   . Kidney disease Neg Hx     Social History:  reports that he has never smoked. He has never used smokeless tobacco. He  reports that he does not drink alcohol or use drugs.  Allergies: No Known Allergies  Medications: I have reviewed the patient's current medications.  Results for orders placed or performed during the hospital encounter of 12/18/17 (from the past 48 hour(s))  Ethanol     Status: None   Collection Time: 12/18/17 10:26 AM  Result Value Ref Range   Alcohol, Ethyl (B) <10 <10 mg/dL    Comment: (NOTE) Lowest detectable limit for serum alcohol is 10 mg/dL. For medical purposes only. Performed at [facility], [place] [address], [place], [place] [address]   Protime-INR     Status: None   Collection Time: 12/18/17 10:26 AM  Result Value Ref Range   Prothrombin Time 14.1 11.4 - 15.2 seconds   INR 1.10     Comment: Performed at [facility] [address] [place], [place] [address]  APTT     Status: None   Collection Time: 12/18/17 10:26 AM  Result Value Ref Range   aPTT 28 24 - 36 seconds    Comment: Performed at [facility] [address] [place] [address]  CBC     Status: None   Collection Time: 12/18/17 10:26 AM  Result Value Ref Range   WBC 7.8 4.0 - 10.5 K/uL   RBC 5.18 4.22 - 5.81 MIL/uL   Hemoglobin 16.7 13.0 - 17.0 g/dL   HCT 50.5 39.0 - 52.0 %   MCV 97.5 78.0 - 100.0 fL   MCH 32.2 26.0 - 34.0 pg   MCHC  33.1 30.0 - 36.0 g/dL   RDW 12.7 11.5 - 15.5 %   Platelets 163 150 - 400 K/uL    Comment: Performed at [facility], [place] [address], [place], [place] [address]  Differential     Status: None   Collection Time: 12/18/17 10:26 AM  Result Value Ref Range   Neutrophils Relative % 73 %   Neutro Abs 5.7 1.7 - 7.7 K/uL   Lymphocytes Relative 16 %   Lymphs Abs 1.2 0.7 - 4.0 K/uL   Monocytes Relative 7 %   Monocytes Absolute 0.5 0.1 - 1.0 K/uL   Eosinophils Relative 3 %   Eosinophils Absolute 0.3 0.0 - 0.7 K/uL   Basophils Relative 1 %   Basophils Absolute 0.1 0.0 - 0.1 K/uL   Immature Granulocytes 0 %   Abs Immature Granulocytes 0.0 0.0 -  0.1 K/uL    Comment: Performed at [facility] [address] [place], [place] [address]  Comprehensive metabolic panel     Status: Abnormal   Collection Time: 12/18/17 10:26 AM  Result Value Ref Range   Sodium 139 135 - 145 mmol/L   Potassium 5.4 (H) 3.5 - 5.1 mmol/L   Chloride 104 98 - 111 mmol/L   CO2 24 22 - 32 mmol/L   Glucose, Bld 233 (H) 70 - 99 mg/dL   BUN 32 (H) 8 - 23 mg/dL   Creatinine, Ser 1.72 (H) 0.61 - 1.24 mg/dL   Calcium 8.9 8.9 - 10.3 mg/dL   Total Protein 6.7 6.5 - 8.1 g/dL   Albumin 3.8 3.5 - 5.0 g/dL   AST 31 15 - 41 U/L   ALT 14 0 - 44 U/L   Alkaline Phosphatase 88 38 - 126 U/L   Total Bilirubin 1.5 (H) 0.3 - 1.2 mg/dL   GFR calc non Af Amer 36 (L) >60 mL/min   GFR calc Af Amer 41 (L) >60 mL/min    Comment: (NOTE) The eGFR has been calculated using the CKD EPI equation. This calculation has not been validated in all clinical situations. eGFR's persistently <60 mL/min signify possible Chronic Kidney Disease.    Anion gap 11 5 - 15    Comment: Performed at [facility] [address], [place], [place] [address]  I-stat troponin, ED     Status: None   Collection Time: 12/18/17 10:39 AM  Result Value Ref Range   Troponin i, poc 0.01 0.00 - 0.08 ng/mL   Comment 3            Comment: Due to the release kinetics of cTnI, a negative result within the first hours of the onset of symptoms does not rule out myocardial infarction with certainty. If myocardial infarction is still suspected, repeat the test at appropriate intervals.   Urine rapid drug screen (hosp performed)     Status: None   Collection Time: 12/18/17 12:11 PM  Result Value Ref Range   Opiates NONE DETECTED NONE DETECTED   Cocaine NONE DETECTED NONE DETECTED   Benzodiazepines NONE DETECTED NONE DETECTED   Amphetamines NONE DETECTED NONE DETECTED   Tetrahydrocannabinol NONE DETECTED NONE DETECTED   Barbiturates NONE DETECTED NONE DETECTED    Comment: (NOTE) DRUG SCREEN FOR  MEDICAL PURPOSES ONLY.  IF CONFIRMATION IS NEEDED FOR ANY PURPOSE, NOTIFY LAB WITHIN 5 DAYS. LOWEST DETECTABLE LIMITS FOR URINE DRUG SCREEN Drug Class  Cutoff (ng/mL) Amphetamine and metabolites    1000 Barbiturate and metabolites    200 Benzodiazepine                 220 Tricyclics and metabolites     300 Opiates and metabolites        300 Cocaine and metabolites        300 THC                            50 Performed at [facility], [place] [address], [place], [place] [address]   Urinalysis, Routine w reflex microscopic     Status: Abnormal   Collection Time: 12/18/17 12:11 PM  Result Value Ref Range   Color, Urine YELLOW YELLOW   APPearance CLOUDY (A) CLEAR   Specific Gravity, Urine 1.015 1.005 - 1.030   pH 5.0 5.0 - 8.0   Glucose, UA NEGATIVE NEGATIVE mg/dL   Hgb urine dipstick MODERATE (A) NEGATIVE   Bilirubin Urine NEGATIVE NEGATIVE   Ketones, ur NEGATIVE NEGATIVE mg/dL   Protein, ur NEGATIVE NEGATIVE mg/dL   Nitrite POSITIVE (A) NEGATIVE   Leukocytes, UA LARGE (A) NEGATIVE   RBC / HPF 11-20 0 - 5 RBC/hpf   WBC, UA >50 (H) 0 - 5 WBC/hpf   Bacteria, UA MANY (A) NONE SEEN   Squamous Epithelial / LPF 0-5 0 - 5   WBC Clumps PRESENT    Mucus PRESENT     Comment: Performed at [facility], [place] [address], [place], [place] [address]  Urine culture     Status: Abnormal (Preliminary result)   Collection Time: 12/18/17  1:25 PM  Result Value Ref Range   Specimen Description URINE, RANDOM    Special Requests NONE    Culture (A)     >=100,000 COLONIES/mL ESCHERICHIA COLI SUSCEPTIBILITIES TO FOLLOW Performed at [facility] [address], [place], [place] [address]    Report Status PENDING   Glucose, capillary     Status: Abnormal   Collection Time: 12/18/17  4:18 PM  Result Value Ref Range   Glucose-Capillary 117 (H) 70 - 99 mg/dL  Glucose, capillary     Status: Abnormal   Collection Time: 12/18/17  9:35 PM  Result Value Ref Range    Glucose-Capillary 133 (H) 70 - 99 mg/dL   Comment 1 Notify RN   Glucose, capillary     Status: Abnormal   Collection Time: 12/19/17  6:08 AM  Result Value Ref Range   Glucose-Capillary 140 (H) 70 - 99 mg/dL   Comment 1 Notify RN    Comment 2 Document in Chart   Hemoglobin A1c     Status: Abnormal   Collection Time: 12/19/17  7:40 AM  Result Value Ref Range   Hgb A1c MFr Bld 7.2 (H) 4.8 - 5.6 %    Comment: (NOTE) Pre diabetes:          5.7%-6.4% Diabetes:              >6.4% Glycemic control for   <7.0% adults with diabetes    Mean Plasma Glucose 159.94 mg/dL    Comment: Performed at [facility], [place] [address], [place], [place] [address]  Lipid panel     Status: Abnormal   Collection Time: 12/19/17  7:40 AM  Result Value Ref Range   Cholesterol 137 0 - 200 mg/dL   Triglycerides 116 <150 mg/dL   HDL  33 (L) >40 mg/dL   Total CHOL/HDL Ratio 4.2 RATIO   VLDL 23 0 - 40 mg/dL   LDL Cholesterol 81 0 - 99 mg/dL    Comment:        Total Cholesterol/HDL:CHD Risk Coronary Heart Disease Risk Table                     Men   Women  1/2 Average Risk   3.4   3.3  Average Risk       5.0   4.4  2 X Average Risk   9.6   7.1  3 X Average Risk  23.4   11.0        Use the calculated Patient Ratio above and the CHD Risk Table to determine the patient's CHD Risk.        ATP III CLASSIFICATION (LDL):  <100     mg/dL   Optimal  100-129  mg/dL   Near or Above                    Optimal  130-159  mg/dL   Borderline  160-189  mg/dL   High  >190     mg/dL   Very High Performed at [facility] [address], [place], [place] [address]   Glucose, capillary     Status: Abnormal   Collection Time: 12/19/17 11:04 AM  Result Value Ref Range   Glucose-Capillary 189 (H) 70 - 99 mg/dL    Ct Head Wo Contrast  Result Date: 12/18/2017 CLINICAL DATA:  Right-sided weakness and headache since waking up this morning. EXAM: CT HEAD WITHOUT CONTRAST TECHNIQUE: Contiguous axial images were  obtained from the base of the skull through the vertex without intravenous contrast. COMPARISON:  Brain MR dated 12/22/2016 and head CT dated 12/21/2016. FINDINGS: Brain: Stable mildly enlarged ventricles and mildly to moderately enlarged subarachnoid spaces. Minimal patchy white matter low density in both cerebral hemispheres without significant change. Stable old left caudate lacunar infarct. No intracranial hemorrhage, mass lesion or CT evidence of acute infarction. Vascular: No hyperdense vessel or unexpected calcification. Skull: Normal. Negative for fracture or focal lesion. Sinuses/Orbits: No acute finding. Other: Again demonstrated is a right frontal scalp lipoma measuring 0.2 x 0.5 cm on image number 66 series 4. IMPRESSION: 1. No acute abnormality. 2. Stable mild atrophy, minimal chronic small vessel white matter ischemic changes in both cerebral hemispheres and old left caudate lacunar infarct. 3. Stable right frontal scalp lipoma. Electronically Signed   By: Claudie Revering M.D.   On: 12/18/2017 11:49    Pertinent items noted in HPI and remainder of comprehensive ROS otherwise negative. Blood pressure (!) 145/81, pulse 86, temperature 98.9 F (37.2 C), temperature source Oral, resp. rate 18, height '5\' 10"'$  (1.778 m), weight 102.1 kg, SpO2 97 %. Patient is awake and alert.  He is oriented and appropriate.  His speech is fluent.  His judgment and insight appear intact.  Motor examination reveals a mild right-sided pronator drift.  The patient's motor strength is grossly intact to major motor group testing bilaterally.  Reflexes are mildly increased on the right normal on the left.  No evidence of other long track signs.  Assessment/Plan: Patient has significant left-sided carotid stenosis with right-sided weakness thought to be vascular in nature.  Work-up is also demonstrated evidence of moderate disc degeneration and disc disease at the C5-6 level with some early spinal cord compression and  significant neuroforaminal narrowing on  the right at C5-6.  At this point I do not think the patient is suffering symptoms of cervical myelopathy or radiculopathy of any significance.  I recommend proceeding with the planned carotid neurectomy.  I will follow along postoperatively.  [name] [name] 12/19/2017, 12:27 PM

## 2017-12-19 NOTE — Progress Notes (Signed)
STROKE TEAM PROGRESS NOTE   HISTORY OF PRESENT ILLNESS (per record) [name] [name] is an 83 y.o. male  With PMH of seizure and DM2 who presented to [facility] ED with right sided weakness.  Patient states that he went to bed at 11pm last night in his usual state of health. Woke up in the middle of the night to urinate in his jug about 0200 and he was normal. this morning about 0700 when he woke up he  noticed that his right side felt weak and the right arm and leg had some numbness. The numbness ha improved but still persist. Patient endorses having head a light HA that resolved with 1 dose of Advil. No prior stroke history, but states he has had weakness before and it was thought to be a TIA about a year ago. Patient walks with a walker or cane at baseline. Denies any vision changes, CP, SOB, ETOH, drug abuse, falls. Of note: patient reports that his left arm resting tremor is most likely parkinson's. He has been told that he has parkinson's symptoms. Denies missing any doses of ASA 81 mg or keppra.  Ed course: BP:153/90, BG: 233, Bun 32, creatinine: 1.72 CT head: no acute abnormality, old left lacunar infarct.   Date last known well: Date: 12/18/2017 Time last known well: Time: 02:00 tPA Given: No: outside of window  Modified Rankin: Rankin Score=2 NIHSS:1, right facial droop   SUBJECTIVE (INTERVAL HISTORY) Not in any acute distress, resting comfortably in a chair    OBJECTIVE Vitals:   12/19/17 0130 12/19/17 0333 12/19/17 0836 12/19/17 1229  BP: 139/77 137/75 (!) 145/81 118/77  Pulse: 68 65 86 75  Resp: 16 18 18 18   Temp: 98.3 F (36.8 C) 98.5 F (36.9 C) 98.9 F (37.2 C) 98.7 F (37.1 C)  TempSrc: Oral Oral Oral Oral  SpO2: 94% 96% 97% 93%  Weight:      Height:        CBC:  Recent Labs  Lab 12/18/17 1026  WBC 7.8  NEUTROABS 5.7  HGB 16.7  HCT 50.5  MCV 97.5  PLT 517    Basic Metabolic Panel:  Recent Labs  Lab 12/18/17 1026  NA 139  K 5.4*  CL 104  CO2 24   GLUCOSE 233*  BUN 32*  CREATININE 1.72*  CALCIUM 8.9    Lipid Panel:     Component Value Date/Time   CHOL 137 12/19/2017 0740   CHOL 113 09/06/2015 1003   TRIG 116 12/19/2017 0740   HDL 33 (L) 12/19/2017 0740   HDL 40 09/06/2015 1003   CHOLHDL 4.2 12/19/2017 0740   VLDL 23 12/19/2017 0740   LDLCALC 81 12/19/2017 0740   LDLCALC 40 09/06/2015 1003   HgbA1c:  Lab Results  Component Value Date   HGBA1C 7.2 (H) 12/19/2017   Urine Drug Screen:     Component Value Date/Time   LABOPIA NONE DETECTED 12/18/2017 1211   COCAINSCRNUR NONE DETECTED 12/18/2017 1211   LABBENZ NONE DETECTED 12/18/2017 1211   AMPHETMU NONE DETECTED 12/18/2017 1211   THCU NONE DETECTED 12/18/2017 1211   LABBARB NONE DETECTED 12/18/2017 1211    Alcohol Level     Component Value Date/Time   ETH <10 12/18/2017 1026    IMAGING   Ct Head Wo Contrast 12/18/2017 IMPRESSION:  1. No acute abnormality.  2. Stable mild atrophy, minimal chronic small vessel white matter ischemic changes in both cerebral hemispheres and old left caudate lacunar infarct.  3. Stable right  frontal scalp lipoma.    MRI / MRA Head - pending     Transthoracic Echocardiogram - pending 00/00/00    Bilateral Carotid Dopplers  12/18/2017 Preliminary notes--Bilateral carotid duplex exam completed. Right ICA 1-39% stenosis, Left ICA critical stenosis which is 80-99% stenosis. Bilateral vertebral arteries patent with antegrade flow.      PHYSICAL EXAM Blood pressure 118/77, pulse 75, temperature 98.7 F (37.1 C), temperature source Oral, resp. rate 18, height 5\' 10"  (1.778 m), weight 102.1 kg, SpO2 93 %.   HEENT-  Normocephalic, no lesions, without obvious abnormality.  Normal external eye and conjunctiva.  Cardiovascular- S1-S2 audible, pulses palpable throughout   Lungs-no rhonchi or wheezing noted, no excessive working breathing.  Saturations within normal limits on RA Abdomen-BS present x 4 quadrants Extremities-  Warm, dry and intact Musculoskeletal- left foot with 2nd and 3rd toe fusion, left hand with resting tremor that stops with action or when touched. Skin-warm and dry, Bilateral feet with shiny red patches  Neurological Examination Mental Status: Alert, oriented, thought content appropriate. Naming intact, Speech fluent without evidence of aphasia.  Able to follow  commands without difficulty. Cranial Nerves: II:  Visual fields grossly normal,  III,IV, VI: ptosis not present, extra-ocular motions intact bilaterally, pupils equal, round, reactive to light and accommodation V,VII: smile asymmetric, slight right facial droop, facial light touch sensation normal bilaterally VIII: hearing normal bilaterally IX,X: uvula rises symmetrically XI: bilateral shoulder shrug XII: midline tongue extension Motor: Right :  Upper extremity   5/5  Left:     Upper extremity   5/5 Lower extremity   5/5                          Lower extremity   5/5 Tone and bulk:normal tone throughout; no atrophy noted Sensory: pinprick and  light touch intact throughout, bilaterally Deep Tendon Reflexes: 1+ and symmetric biceps and patella. No ankle jerk Plantars: Right: downgoing                                Left: downgoing Gait: deferred         ASSESSMENT/PLAN [name] [name] is a 83 y.o. male with history of seizure, DM, tremor (Parkinson's?) and previous stroke presenting with right sided weakness / numbness / facial droop. He did not receive IV t-PA due to late presentation.  Suspected Stroke:  MRI / MRA completed  CT head - nothing acute.Old left caudate lacunar infarct.  MRI/MRA head - Multiple small acute infarcts in the left MCA territory and left occipital lobe. 2. Acute lacunar infarct in the right centrum semiovale. 3. Moderate chronic small vessel ischemic disease, progressed from 2018. 4. Patent circle of Willis without major branch occlusion. 5. Moderate left P2 and possible left A1  stenoses.  6. Fetal origin of both PCAs.  MRA head - completed  Carotid Doppler - Left ICA - 80-99% stenosis.  2D Echo - completed, WNL  LDL - 81  HgbA1c - 7.2  VTE prophylaxis - [place] Heparin  Diet  - Carb modified with thin liquids  aspirin 81 mg daily prior to admission, now on aspirin 81 mg daily  Patient counseled to be compliant with his antithrombotic medications  Ongoing aggressive stroke risk factor management  Therapy recommendations:  CIR recommended  Disposition:  Pending  Hypertension  Stable . Permissive hypertension (OK if < 220/120) but gradually normalize in  5-7 days . Long-term BP goal normotensive  Hyperlipidemia  Lipid lowering medication PTA:  Zocor 20 mg daily  LDL 81, goal < 70  Current lipid lowering medication: Increase Zocor to 40 mg daily.  Continue statin at discharge  Diabetes  HgbA1c 7.2, goal < 7.0  Uncontrolled  Other Stroke Risk Factors  Advanced age  Obesity, Body mass index is 32.28 kg/m., recommend weight loss, diet and exercise as appropriate   Hx stroke/TIA   Other Active Problems  Hyperkalemia - Kayexalate  UTI - IV Rocephin  BUN - 32 ; Creatinine 1.72   Plan  Vascular surgery consulted for left carotid stenosis - Dr Scot Dock -> CEA Tuesday  Dr Cristobal Goldmann spoke with Dr Annette Stable about C spine stenosis - Dr Annette Stable will follow. No intervention at this time.     Hospital day # 0  Agree with pursing CEA. Patient also has R sided stroke as well on MRI, multiple vascular territory strokes, concerning for embolic source, may consider TEE/Loop recorder.   To contact Stroke Continuity provider, please refer to [email or web address] After hours, contact General Neurology

## 2017-12-19 NOTE — Progress Notes (Signed)
Spoke with step son, reports that patient's wife has visual deficits and is not in "the best shape" to take care of patient, so patient would need to be at baseline level of function in order to return home, (independent with cane and that she only assists him with portions of bathing). Step son in agreement that patient is not safe to d/c home in current condition. Please see therapy evaluation for recommendations and current functional status. Alben Deeds, PT DPT  Board Certified Neurologic Specialist Acute Rehabilitation Services Pager [phone] Office [phone]

## 2017-12-19 NOTE — Progress Notes (Signed)
   VASCULAR SURGERY ASSESSMENT & PLAN:   SYMPTOMATIC LEFT CAROTID STENOSIS: This patient has a symptomatic greater than 80% left carotid stenosis.  I have recommended left carotid endarterectomy which is scheduled for Tuesday.  He is on aspirin and is on a statin.  I believe that he is scheduled to have an MRI today.  SUBJECTIVE:   No complaints overnight.  PHYSICAL EXAM:   Vitals:   12/18/17 2130 12/18/17 2330 12/19/17 0130 12/19/17 0333  BP: (!) 174/75 (!) 151/69 139/77 137/75  Pulse:  68 68 65  Resp: 18 18 16 18   Temp: 97.8 F (36.6 C) 97.8 F (36.6 C) 98.3 F (36.8 C) 98.5 F (36.9 C)  TempSrc: Oral Oral Oral Oral  SpO2: 96% 96% 94% 96%  Weight:      Height:       Mild weakness to grip in the right hand.  LABS:   Lab Results  Component Value Date   WBC 7.8 12/18/2017   HGB 16.7 12/18/2017   HCT 50.5 12/18/2017   MCV 97.5 12/18/2017   PLT 163 12/18/2017   Lab Results  Component Value Date   CREATININE 1.72 (H) 12/18/2017   Lab Results  Component Value Date   INR 1.10 12/18/2017   CBG (last 3)  Recent Labs    12/18/17 1618 12/18/17 2135  GLUCAP 117* 133*    PROBLEM LIST:    Principal Problem:   Right sided weakness Active Problems:   Hyperkalemia   Acute renal failure (HCC)   HTN (hypertension)   Diabetes mellitus, type 2 (HCC)   Hyperlipidemia associated with type 2 diabetes mellitus ([place])   Seizures ([place])   UTI (urinary tract infection)   CURRENT MEDS:   .  stroke: mapping our early stages of recovery book   Does not apply Once  . aspirin EC  81 mg Oral Daily  . finasteride  5 mg Oral Daily  . heparin  5,000 Units Subcutaneous Q8H  . Influenza vac split quadrivalent PF  0.5 mL Intramuscular Tomorrow-1000  . insulin aspart  0-5 Units Subcutaneous QHS  . insulin aspart  0-9 Units Subcutaneous TID WC  . levETIRAcetam  500 mg Oral BID  . simvastatin  20 mg Oral Daily  . tamsulosin  0.4 mg Oral QPC supper    Deitra Mayo Beeper:  [phone] Office: [phone] 12/19/2017

## 2017-12-19 NOTE — Progress Notes (Signed)
PROGRESS NOTE  [name] [name]:[id number] DOB: 1934-07-09 DOA: 12/18/2017 PCP: Clovia Cuff, MD   LOS: 0 days   Brief Narrative / Interim history: 83 year old male with history of prior CVA, Parkinson's disease, recurrent UTIs, seizures, hypertension, chronic kidney disease stage III, diabetes mellitus who was admitted to the hospital on 10/5 with right-sided weakness when he woke up in the morning.  He also was weak in the right leg and also reported some tingling and numbness.  Neurology was consulted on admission  Subjective: Doing well this morning, denies any headaches, no chest pain, no shortness of breath, no abdominal pain, nausea or vomiting.  Subjectively feels like the weakness is gotten better  Assessment & Plan: Principal Problem:   Right sided weakness Active Problems:   Hyperkalemia   Acute renal failure (HCC)   HTN (hypertension)   Diabetes mellitus, type 2 (HCC)   Hyperlipidemia associated with type 2 diabetes mellitus (HCC)   Seizures (HCC)   UTI (urinary tract infection)   Right-sided weakness/CVA -Neurology following, appreciate input, will complete work-up -MRI of the brain pending, he has a left ankle bracelet which will need to be temporarily removed during the MRI, nursing staff is working on arranging that -2D echo pending -Hemoglobin A1c 7.2 which is acceptable for his age group, lipid panel shows an LDL of 104, he is on a statin  Left ICA critical stenosis -This was seen on the carotid Doppler, vascular surgery consulted, tentative endarterectomy on Tuesday, continue antiplatelets  Diabetes mellitus -CBGs well-controlled, continue sliding scale  Hyperlipidemia -On statin  History of seizure disorder -Continue home Keppra  Presumed UTI -For now placed on Rocephin, follow cultures, now showing 100,000 E. Coli, sensitivities pending  Acute kidney injury chronic kidney disease stage III -1.7 on admission increased from baseline 1.3-1.5, repeat  BMP pending   Scheduled Meds: .  stroke: mapping our early stages of recovery book   Does not apply Once  . aspirin EC  81 mg Oral Daily  . finasteride  5 mg Oral Daily  . heparin  5,000 Units Subcutaneous Q8H  . Influenza vac split quadrivalent PF  0.5 mL Intramuscular Tomorrow-1000  . insulin aspart  0-5 Units Subcutaneous QHS  . insulin aspart  0-9 Units Subcutaneous TID WC  . levETIRAcetam  500 mg Oral BID  . simvastatin  20 mg Oral Daily  . tamsulosin  0.4 mg Oral QPC supper   Continuous Infusions: . sodium chloride 75 mL/hr at 12/19/17 0606  . cefTRIAXone (ROCEPHIN)  IV 1 g (12/19/17 1141)   PRN Meds:.acetaminophen **OR** acetaminophen (TYLENOL) oral liquid 160 mg/5 mL **OR** acetaminophen, senna-docusate  DVT prophylaxis: Heparin Code Status: Full code Family Communication: No family present at bedside Disposition Plan: To be determined  Consultants:   Neurology  Procedures:   2D echo: Pending  Antimicrobials:  Ceftriaxone 10/5 >>  Objective: Vitals:   12/18/17 2330 12/19/17 0130 12/19/17 0333 12/19/17 0836  BP: (!) 151/69 139/77 137/75 (!) 145/81  Pulse: 68 68 65 86  Resp: 18 16 18 18   Temp: 97.8 F (36.6 C) 98.3 F (36.8 C) 98.5 F (36.9 C) 98.9 F (37.2 C)  TempSrc: Oral Oral Oral Oral  SpO2: 96% 94% 96% 97%  Weight:      Height:        Intake/Output Summary (Last 24 hours) at 12/19/2017 1206 Last data filed at 12/19/2017 1141 Gross per 24 hour  Intake 2862.61 ml  Output 1400 ml  Net 1462.61 ml   Filed  Weights   12/18/17 1000  Weight: 102.1 kg    Examination:  Constitutional: NAD Eyes: PERRL, lids and conjunctivae normal ENMT: Mucous membranes are moist.  Neck: normal, supple Respiratory: clear to auscultation bilaterally, no wheezing, no crackles.  Cardiovascular: Regular rate and rhythm, no murmurs / rubs / gallops. No LE edema.  Abdomen: no tenderness. Bowel sounds positive.  No rebound, no guarding Musculoskeletal: no clubbing /  cyanosis.  Skin: no rashes seen Neurologic: Resting tremor noted, CN 2-12 grossly intact. Strength 5/5 in all 4.  Psychiatric: Normal judgment and insight. Alert and oriented x 3. Normal mood.    Data Reviewed: I have independently reviewed following labs and imaging studies   CBC: Recent Labs  Lab 12/18/17 1026  WBC 7.8  NEUTROABS 5.7  HGB 16.7  HCT 50.5  MCV 97.5  PLT 299   Basic Metabolic Panel: Recent Labs  Lab 12/18/17 1026  NA 139  K 5.4*  CL 104  CO2 24  GLUCOSE 233*  BUN 32*  CREATININE 1.72*  CALCIUM 8.9   GFR: Estimated Creatinine Clearance: 41 mL/min (A) (by C-G formula based on SCr of 1.72 mg/dL (H)). Liver Function Tests: Recent Labs  Lab 12/18/17 1026  AST 31  ALT 14  ALKPHOS 88  BILITOT 1.5*  PROT 6.7  ALBUMIN 3.8   No results for input(s): LIPASE, AMYLASE in the last 168 hours. No results for input(s): AMMONIA in the last 168 hours. Coagulation Profile: Recent Labs  Lab 12/18/17 1026  INR 1.10   Cardiac Enzymes: No results for input(s): CKTOTAL, CKMB, CKMBINDEX, TROPONINI in the last 168 hours. BNP (last 3 results) No results for input(s): PROBNP in the last 8760 hours. HbA1C: Recent Labs    12/19/17 0740  HGBA1C 7.2*   CBG: Recent Labs  Lab 12/18/17 1618 12/18/17 2135 12/19/17 0608 12/19/17 1104  GLUCAP 117* 133* 140* 189*   Lipid Profile: Recent Labs    12/19/17 0740  CHOL 137  HDL 33*  LDLCALC 81  TRIG 116  CHOLHDL 4.2   Thyroid Function Tests: No results for input(s): TSH, T4TOTAL, FREET4, T3FREE, THYROIDAB in the last 72 hours. Anemia Panel: No results for input(s): VITAMINB12, FOLATE, FERRITIN, TIBC, IRON, RETICCTPCT in the last 72 hours. Urine analysis:    Component Value Date/Time   COLORURINE YELLOW 12/18/2017 1211   APPEARANCEUR CLOUDY (A) 12/18/2017 1211   LABSPEC 1.015 12/18/2017 1211   PHURINE 5.0 12/18/2017 1211   GLUCOSEU NEGATIVE 12/18/2017 1211   HGBUR MODERATE (A) 12/18/2017 1211    BILIRUBINUR NEGATIVE 12/18/2017 1211   KETONESUR NEGATIVE 12/18/2017 1211   PROTEINUR NEGATIVE 12/18/2017 1211   UROBILINOGEN 0.2 03/14/2009 1129   NITRITE POSITIVE (A) 12/18/2017 1211   LEUKOCYTESUR LARGE (A) 12/18/2017 1211   Sepsis Labs: Invalid input(s): PROCALCITONIN, LACTICIDVEN  Recent Results (from the past 240 hour(s))  Urine culture     Status: Abnormal (Preliminary result)   Collection Time: 12/18/17  1:25 PM  Result Value Ref Range Status   Specimen Description URINE, RANDOM  Final   Special Requests NONE  Final   Culture (A)  Final    >=100,000 COLONIES/mL ESCHERICHIA COLI SUSCEPTIBILITIES TO FOLLOW Performed at [facility], [address], [place], [place] [address]    Report Status PENDING  Incomplete      Radiology Studies: Ct Head Wo Contrast  Result Date: 12/18/2017 CLINICAL DATA:  Right-sided weakness and headache since waking up this morning. EXAM: CT HEAD WITHOUT CONTRAST TECHNIQUE: Contiguous axial images were  obtained from the base of the skull through the vertex without intravenous contrast. COMPARISON:  Brain MR dated 12/22/2016 and head CT dated 12/21/2016. FINDINGS: Brain: Stable mildly enlarged ventricles and mildly to moderately enlarged subarachnoid spaces. Minimal patchy white matter low density in both cerebral hemispheres without significant change. Stable old left caudate lacunar infarct. No intracranial hemorrhage, mass lesion or CT evidence of acute infarction. Vascular: No hyperdense vessel or unexpected calcification. Skull: Normal. Negative for fracture or focal lesion. Sinuses/Orbits: No acute finding. Other: Again demonstrated is a right frontal scalp lipoma measuring 0.2 x 0.5 cm on image number 66 series 4. IMPRESSION: 1. No acute abnormality. 2. Stable mild atrophy, minimal chronic small vessel white matter ischemic changes in both cerebral hemispheres and old left caudate lacunar infarct. 3. Stable right frontal scalp lipoma.  Electronically Signed   By: Claudie Revering M.D.   On: 12/18/2017 11:49     Marzetta Board, MD, PhD [facility] Pager [phone]  If 7PM-7AM, please contact night-coverage [email or web address] Password [email or web address] 12/19/2017, 12:06 PM

## 2017-12-19 NOTE — Evaluation (Signed)
Occupational Therapy Evaluation Patient Details Name: [name] [name] MRN: [id number] DOB: [birth year] Today's Date: 12/19/2017    History of Present Illness Pt is a 83 y.o. male, past medical history of recurrent UTIs, seizures, CVA, Parkinson disease, hypertension, CKD stage III, Beatties mellitus, presents with complaints of right-sided weakness, numbness and tingling, and headache. Carotid doppler positive for signifincat left ICA critical stensosis 80-99%, plan for L carotid endarterectomy Tuesday, CT negative, MRI scheduled.    Clinical Impression   PTA patient reports using cane for mobility, completing dressing independently with spouse assisting with bathing (while he holds to grab bars standing in shower).  He was admitted for above, and limited by impaired coordination, impaired cognition (decreased initiation, sequencing, slow processing and safety), dominant R UE hypertonicity, impaired balance, and decreased activity tolerance.  Requires min assist for grooming, UB ADLs with mod assist, LB ADLs with max to total assist, and transfers with min guard +2 for safety.  Patient will benefit from continued OT services while admitted, and believe he will best benefit from intensive CIR level rehab in order to optimize independence before returning home with spouses support. Will continue to follow.     Follow Up Recommendations  CIR    Equipment Recommendations  Other (comment)(TBD at next level of care)    Recommendations for Other Services Rehab consult     Precautions / Restrictions Precautions Precautions: Fall;Other (comment) Precaution Comments: L ankle monitor  Restrictions Weight Bearing Restrictions: No      Mobility Bed Mobility Overal bed mobility: Needs Assistance Bed Mobility: Supine to Sit     Supine to sit: Mod assist;HOB elevated     General bed mobility comments: mod assist for trunk support into sitting at EOB, verbal cueing to reach and use rails    Transfers Overall transfer level: Needs assistance Equipment used: 1 person hand held assist Transfers: Sit to/from Stand Sit to Stand: Min guard;+2 safety/equipment         General transfer comment: cueing for hand placement and safety, reliant on UE support     Balance Overall balance assessment: Needs assistance Sitting-balance support: Feet supported;No upper extremity supported Sitting balance-Leahy Scale: Fair Sitting balance - Comments: static sitting, limited dynamic    Standing balance support: Bilateral upper extremity supported;During functional activity Standing balance-Leahy Scale: Poor Standing balance comment: relaint on B UE support                           ADL either performed or assessed with clinical judgement   ADL Overall ADL's : Needs assistance/impaired     Grooming: Minimal assistance;Standing;Sitting   Upper Body Bathing: Moderate assistance;Sitting   Lower Body Bathing: Maximal assistance;Sit to/from stand   Upper Body Dressing : Moderate assistance;Sitting   Lower Body Dressing: Total assistance;Sit to/from stand   Toilet Transfer: Min guard;+2 for safety/equipment;Ambulation(simulated to recliner, hand held assist)   Toileting- Clothing Manipulation and Hygiene: Minimal assistance;Sit to/from stand         General ADL Comments: Pt with slow processing, initation and sequencing. Limited by decreased coordination and strength of R dominant UE.      Vision Baseline Vision/History: Wears glasses Wears Glasses: Reading only Patient Visual Report: Other (comment)(denies changes, but glasses not available ) Vision Assessment?: Yes Eye Alignment: Within Functional Limits Ocular Range of Motion: Within Functional Limits Alignment/Gaze Preference: Within Defined Limits Tracking/Visual Pursuits: Impaired - to be further tested in functional context(difficutly coordinate and sustain tracking towards L  and R ) Saccades: Impaired -  to be further tested in functional context Additional Comments: further assessment recommended     Perception     Praxis      Pertinent Vitals/Pain Pain Assessment: No/denies pain     Hand Dominance Right   Extremity/Trunk Assessment Upper Extremity Assessment Upper Extremity Assessment: RUE deficits/detail;LUE deficits/detail RUE Deficits / Details: Shoulder FF to 45, limited by hypertonicity in shoulder; brunstrom level V.  grossly 3+/5 MMT, able to use a gross stabilizer  RUE Sensation: decreased light touch;decreased proprioception RUE Coordination: decreased fine motor;decreased gross motor LUE Deficits / Details: shoulder FF to 90, noted resting tremor; grossly 4/5 MMT  LUE Sensation: WNL LUE Coordination: WNL   Lower Extremity Assessment Lower Extremity Assessment: Defer to PT evaluation   Cervical / Trunk Assessment Cervical / Trunk Assessment: Kyphotic   Communication Communication Communication: No difficulties   Cognition Arousal/Alertness: Awake/alert Behavior During Therapy: WFL for tasks assessed/performed Overall Cognitive Status: Impaired/Different from baseline Area of Impairment: Attention;Memory;Following commands;Safety/judgement;Awareness;Problem solving                   Current Attention Level: Selective Memory: Decreased short-term memory Following Commands: Follows one step commands consistently;Follows one step commands with increased time Safety/Judgement: Decreased awareness of safety;Decreased awareness of deficits Awareness: Emergent Problem Solving: Slow processing;Decreased initiation;Difficulty sequencing;Requires verbal cues     General Comments       Exercises     Shoulder Instructions      Home Living Family/patient expects to be discharged to:: Private residence Living Arrangements: Spouse/significant other;Children Available Help at Discharge: Family Type of Home: House Home Access: Stairs to enter Engineer, site of Steps: 7 Entrance Stairs-Rails: Can reach both Home Layout: One level     Bathroom Shower/Tub: Tub/shower unit;Curtain   Biochemist, clinical: Standard     Home Equipment: Environmental consultant - 2 wheels;Walker - 4 wheels;Cane - single point;Bedside commode;Shower seat;Grab bars - tub/shower          Prior Functioning/Environment Level of Independence: Needs assistance  Gait / Transfers Assistance Needed: ambulated with cane ADL's / Homemaking Assistance Needed: linda (wife) helps with the showering, cooks but patient reports self dresses(reports linda completes all bathing, pt holding onto grabbar)            OT Problem List: Decreased strength;Decreased range of motion;Decreased activity tolerance;Impaired balance (sitting and/or standing);Decreased cognition;Decreased coordination;Impaired vision/perception;Decreased safety awareness;Decreased knowledge of use of DME or AE;Decreased knowledge of precautions;Impaired UE functional use;Impaired sensation;Impaired tone      OT Treatment/Interventions: Self-care/ADL training;Neuromuscular education;Therapeutic exercise;Energy conservation;DME and/or AE instruction;Therapeutic activities;Cognitive remediation/compensation;Visual/perceptual remediation/compensation;Patient/family education;Balance training    OT Goals(Current goals can be found in the care plan section) Acute Rehab OT Goals Patient Stated Goal: to get home  OT Goal Formulation: With patient Time For Goal Achievement: 01/02/18 Potential to Achieve Goals: Good  OT Frequency: Min 2X/week   Barriers to D/C:            Co-evaluation PT/OT/SLP Co-Evaluation/Treatment: Yes Reason for Co-Treatment: Complexity of the patient's impairments (multi-system involvement);For patient/therapist safety;To address functional/ADL transfers   OT goals addressed during session: ADL's and self-care;Other (comment)(transfers )      AM-PAC PT "6 Clicks" Daily Activity      Outcome Measure Help from another person eating meals?: None Help from another person taking care of personal grooming?: A Little Help from another person toileting, which includes using toliet, bedpan, or urinal?: Total Help from another person bathing (including washing, rinsing, drying)?: A Lot  Help from another person to put on and taking off regular upper body clothing?: A Lot Help from another person to put on and taking off regular lower body clothing?: A Lot 6 Click Score: 14   End of Session Equipment Utilized During Treatment: Gait belt Nurse Communication: Mobility status  Activity Tolerance: Patient tolerated treatment well Patient left: in chair;with call bell/phone within reach;with chair alarm set  OT Visit Diagnosis: Unsteadiness on feet (R26.81);Other abnormalities of gait and mobility (R26.89);Muscle weakness (generalized) (M62.81);Other symptoms and signs involving the nervous system (R29.898);Other symptoms and signs involving cognitive function;Hemiplegia and hemiparesis Hemiplegia - Right/Left: Right Hemiplegia - dominant/non-dominant: Dominant Hemiplegia - caused by: Other cerebrovascular disease                Time: 4715-9539 OT Time Calculation (min): 27 min Charges:  OT General Charges $OT Visit: 1 Visit OT Evaluation $OT Eval Moderate Complexity: [place], [place] Acute Rehabilitation Services Pager [phone] Office [phone]   Delight Stare 12/19/2017, 8:32 AM

## 2017-12-19 NOTE — Progress Notes (Signed)
Pt "ankle bracelet" has been removed by someone from the [employer] office. RN notified MRI and spoke to Tim (MRI specialist) regarding pt' MRI today.  Ave Filter, RN

## 2017-12-19 NOTE — Progress Notes (Signed)
RN spoke to wife regarding pt's "house arrest" ankle bracelet and she said it is from the [employer] of [employer]. Per [facility] department, they did not put the bracelet on him. At this time, MRI unable to be complete d/t this ankle bracelet. MD aware.   Ave Filter, RN

## 2017-12-20 DIAGNOSIS — Z8673 Personal history of transient ischemic attack (TIA), and cerebral infarction without residual deficits: Secondary | ICD-10-CM

## 2017-12-20 DIAGNOSIS — I639 Cerebral infarction, unspecified: Secondary | ICD-10-CM | POA: Diagnosis present

## 2017-12-20 DIAGNOSIS — N390 Urinary tract infection, site not specified: Secondary | ICD-10-CM | POA: Diagnosis present

## 2017-12-20 DIAGNOSIS — Z79899 Other long term (current) drug therapy: Secondary | ICD-10-CM | POA: Diagnosis not present

## 2017-12-20 DIAGNOSIS — R0981 Nasal congestion: Secondary | ICD-10-CM | POA: Diagnosis not present

## 2017-12-20 DIAGNOSIS — Z23 Encounter for immunization: Secondary | ICD-10-CM | POA: Diagnosis present

## 2017-12-20 DIAGNOSIS — M1990 Unspecified osteoarthritis, unspecified site: Secondary | ICD-10-CM | POA: Diagnosis present

## 2017-12-20 DIAGNOSIS — N400 Enlarged prostate without lower urinary tract symptoms: Secondary | ICD-10-CM | POA: Diagnosis present

## 2017-12-20 DIAGNOSIS — E785 Hyperlipidemia, unspecified: Secondary | ICD-10-CM | POA: Diagnosis present

## 2017-12-20 DIAGNOSIS — G20 Parkinson's disease: Secondary | ICD-10-CM | POA: Diagnosis present

## 2017-12-20 DIAGNOSIS — E1165 Type 2 diabetes mellitus with hyperglycemia: Secondary | ICD-10-CM | POA: Diagnosis not present

## 2017-12-20 DIAGNOSIS — G40909 Epilepsy, unspecified, not intractable, without status epilepticus: Secondary | ICD-10-CM | POA: Diagnosis present

## 2017-12-20 DIAGNOSIS — E1169 Type 2 diabetes mellitus with other specified complication: Secondary | ICD-10-CM | POA: Diagnosis not present

## 2017-12-20 DIAGNOSIS — N170 Acute kidney failure with tubular necrosis: Secondary | ICD-10-CM | POA: Diagnosis not present

## 2017-12-20 DIAGNOSIS — I63132 Cerebral infarction due to embolism of left carotid artery: Secondary | ICD-10-CM | POA: Diagnosis present

## 2017-12-20 DIAGNOSIS — R0989 Other specified symptoms and signs involving the circulatory and respiratory systems: Secondary | ICD-10-CM | POA: Diagnosis not present

## 2017-12-20 DIAGNOSIS — I4510 Unspecified right bundle-branch block: Secondary | ICD-10-CM | POA: Diagnosis present

## 2017-12-20 DIAGNOSIS — I10 Essential (primary) hypertension: Secondary | ICD-10-CM

## 2017-12-20 DIAGNOSIS — E46 Unspecified protein-calorie malnutrition: Secondary | ICD-10-CM | POA: Diagnosis not present

## 2017-12-20 DIAGNOSIS — R531 Weakness: Secondary | ICD-10-CM

## 2017-12-20 DIAGNOSIS — N189 Chronic kidney disease, unspecified: Secondary | ICD-10-CM | POA: Diagnosis not present

## 2017-12-20 DIAGNOSIS — G20A1 Parkinson's disease without dyskinesia, without mention of fluctuations: Secondary | ICD-10-CM

## 2017-12-20 DIAGNOSIS — E669 Obesity, unspecified: Secondary | ICD-10-CM | POA: Diagnosis not present

## 2017-12-20 DIAGNOSIS — N179 Acute kidney failure, unspecified: Secondary | ICD-10-CM | POA: Diagnosis present

## 2017-12-20 DIAGNOSIS — I69351 Hemiplegia and hemiparesis following cerebral infarction affecting right dominant side: Secondary | ICD-10-CM | POA: Diagnosis not present

## 2017-12-20 DIAGNOSIS — Z7984 Long term (current) use of oral hypoglycemic drugs: Secondary | ICD-10-CM | POA: Diagnosis not present

## 2017-12-20 DIAGNOSIS — R29701 NIHSS score 1: Secondary | ICD-10-CM | POA: Diagnosis present

## 2017-12-20 DIAGNOSIS — I5189 Other ill-defined heart diseases: Secondary | ICD-10-CM

## 2017-12-20 DIAGNOSIS — Z87442 Personal history of urinary calculi: Secondary | ICD-10-CM | POA: Diagnosis not present

## 2017-12-20 DIAGNOSIS — I63413 Cerebral infarction due to embolism of bilateral middle cerebral arteries: Secondary | ICD-10-CM | POA: Diagnosis not present

## 2017-12-20 DIAGNOSIS — G8191 Hemiplegia, unspecified affecting right dominant side: Secondary | ICD-10-CM | POA: Diagnosis present

## 2017-12-20 DIAGNOSIS — R569 Unspecified convulsions: Secondary | ICD-10-CM | POA: Diagnosis not present

## 2017-12-20 DIAGNOSIS — R402252 Coma scale, best verbal response, oriented, at arrival to emergency department: Secondary | ICD-10-CM | POA: Diagnosis present

## 2017-12-20 DIAGNOSIS — Z87440 Personal history of urinary (tract) infections: Secondary | ICD-10-CM | POA: Diagnosis not present

## 2017-12-20 DIAGNOSIS — I63512 Cerebral infarction due to unspecified occlusion or stenosis of left middle cerebral artery: Secondary | ICD-10-CM | POA: Diagnosis not present

## 2017-12-20 DIAGNOSIS — R7309 Other abnormal glucose: Secondary | ICD-10-CM | POA: Diagnosis not present

## 2017-12-20 DIAGNOSIS — E875 Hyperkalemia: Secondary | ICD-10-CM | POA: Diagnosis present

## 2017-12-20 DIAGNOSIS — I129 Hypertensive chronic kidney disease with stage 1 through stage 4 chronic kidney disease, or unspecified chronic kidney disease: Secondary | ICD-10-CM | POA: Diagnosis present

## 2017-12-20 DIAGNOSIS — Z7982 Long term (current) use of aspirin: Secondary | ICD-10-CM | POA: Diagnosis not present

## 2017-12-20 DIAGNOSIS — I63412 Cerebral infarction due to embolism of left middle cerebral artery: Secondary | ICD-10-CM

## 2017-12-20 DIAGNOSIS — E876 Hypokalemia: Secondary | ICD-10-CM

## 2017-12-20 DIAGNOSIS — I6522 Occlusion and stenosis of left carotid artery: Secondary | ICD-10-CM | POA: Diagnosis not present

## 2017-12-20 DIAGNOSIS — N183 Chronic kidney disease, stage 3 (moderate): Secondary | ICD-10-CM | POA: Diagnosis present

## 2017-12-20 DIAGNOSIS — R402142 Coma scale, eyes open, spontaneous, at arrival to emergency department: Secondary | ICD-10-CM | POA: Diagnosis present

## 2017-12-20 DIAGNOSIS — R402362 Coma scale, best motor response, obeys commands, at arrival to emergency department: Secondary | ICD-10-CM | POA: Diagnosis present

## 2017-12-20 DIAGNOSIS — I6340 Cerebral infarction due to embolism of unspecified cerebral artery: Secondary | ICD-10-CM

## 2017-12-20 DIAGNOSIS — E1122 Type 2 diabetes mellitus with diabetic chronic kidney disease: Secondary | ICD-10-CM | POA: Diagnosis present

## 2017-12-20 DIAGNOSIS — Z96641 Presence of right artificial hip joint: Secondary | ICD-10-CM | POA: Diagnosis present

## 2017-12-20 LAB — URINE CULTURE: Culture: >=100000 — AB

## 2017-12-20 LAB — GLUCOSE, CAPILLARY
Glucose-Capillary: 127 mg/dL — ABNORMAL HIGH (ref 70–99)
Glucose-Capillary: 213 mg/dL — ABNORMAL HIGH (ref 70–99)
Glucose-Capillary: 158 mg/dL — ABNORMAL HIGH (ref 70–99)
Glucose-Capillary: 146 mg/dL — ABNORMAL HIGH (ref 70–99)

## 2017-12-20 LAB — SURGICAL PCR SCREEN
MRSA, PCR: NEGATIVE
Staphylococcus aureus: NEGATIVE

## 2017-12-20 MED ORDER — CEFAZOLIN SODIUM-DEXTROSE 2-4 GM/100ML-% IV SOLN: 2.0000 g | INTRAVENOUS | Status: DC

## 2017-12-20 NOTE — Consult Note (Signed)
Physical Medicine and Rehabilitation Consult Reason for Consult: Right side weakness Referring Physician: [facility]   HPI: [name] [name] is a 83 y.o. right-handed male with history of recurrent UTI, CVA, Parkinson's disease, CKD stage III, diabetes mellitus and seizure disorder maintained on Keppra.  Per chart review and patient, patient lives with spouse.  Ambulated with a cane.  One level home with 7 steps to entry.  Wife does with some simple ADLs.  Children in the area check as needed.  Presented 12/18/2017 with right-sided weakness and headache.  Cranial CT scan reviewed, unremarkable for acute intracranial process. Per report, old left caudate lacunar infarction.  Patient did not receive TPA.  MRI/MRA shows multiple small acute infarcts in the left MCA territory and left occipital lobe.  Acute lacunar infarction in the right centrum semi-ovale.  No major branch occlusion.  Echocardiogram with ejection fraction of 91% grade 1 diastolic dysfunction.  Carotid Dopplers with left ICA stenosis 80 to 99%.  Vascular surgery Dr. Deitra Mayo consulted in regards to ICA stenosis planning for carotid enterectomy.  Currently maintained on aspirin for CVA prophylaxis.  Subcutaneous heparin for DVT prophylaxis.  Urine culture 12/18/2017 greater than 100,000 E. coli maintained on Rocephin.  Therapy evaluations completed with recommendations of physical medicine rehab consult.   Review of Systems  Constitutional: Negative for chills and fever.  HENT: Negative for hearing loss.   Eyes: Negative for blurred vision and double vision.  Respiratory: Negative for shortness of breath.   Cardiovascular: Negative for chest pain, palpitations and leg swelling.  Gastrointestinal: Positive for constipation. Negative for nausea.  Genitourinary: Positive for urgency. Negative for dysuria and hematuria.  Musculoskeletal: Positive for myalgias.  Skin: Negative for rash.  Neurological: Positive for sensory change,  focal weakness, seizures and headaches.  All other systems reviewed and are negative.  Past Medical History:  Diagnosis Date  . Arthritis   . Diabetes mellitus without complication ([place])    type 2  . Prostate disease   . Renal disorder    kidney stones  . Seizures ([place])    Past Surgical History:  Procedure Laterality Date  . CYSTOSCOPY W/ URETERAL STENT PLACEMENT Bilateral 08/10/2015   Procedure: CYSTOSCOPY WITH BILATERAL RETROGRADE PYELOGRAM/BILATERAL URETERAL STENT PLACEMENT;  Surgeon: Kathie Rhodes, MD;  Location: [facility];  Service: Urology;  Laterality: Bilateral;  . CYSTOSCOPY WITH RETROGRADE PYELOGRAM, URETEROSCOPY AND STENT PLACEMENT Bilateral 09/02/2015   Procedure: CYSTOSCOPY BILATERAL STENT REMOVAL BILATERAL RETROGRADE PYELOGRAM, BILATERAL URETEROSCOPY AND BILATERAL STENT PLACEMENT;  Surgeon: Kathie Rhodes, MD;  Location: [facility];  Service: Urology;  Laterality: Bilateral;  . HOLMIUM LASER APPLICATION Bilateral 4/78/2956   Procedure: BILATERAL HOLMIUM LASER  LITHO ;  Surgeon: Kathie Rhodes, MD;  Location: [facility];  Service: Urology;  Laterality: Bilateral;  . JOINT REPLACEMENT Right 2007   hip, Dr. Alvan Dame  . TONSILLECTOMY Bilateral    70 years ago   Family History  Problem Relation Age of Onset  . Early death Neg Hx   . Kidney disease Neg Hx    Social History:  reports that he has never smoked. He has never used smokeless tobacco. He reports that he does not drink alcohol or use drugs. Allergies: No Known Allergies Medications Prior to Admission  Medication Sig Dispense Refill  . aspirin 81 MG tablet Take 81 mg by mouth daily.     . finasteride (PROSCAR) 5 MG tablet Take 5 mg by mouth daily.    Marland Kitchen ibuprofen (ADVIL,MOTRIN) 200 MG tablet  Take 200 mg by mouth every 6 (six) hours as needed for headache or mild pain.    Marland Kitchen levETIRAcetam (KEPPRA) 500 MG tablet Take 1 tablet (500 mg total) by mouth 2 (two) times daily. 60 tablet 0  . metFORMIN (GLUCOPHAGE) 1000 MG tablet Take 1 tablet  (1,000 mg total) by mouth 2 (two) times daily with a meal. 15 tablet 0  . simvastatin (ZOCOR) 20 MG tablet Take 20 mg by mouth daily.     . tamsulosin (FLOMAX) 0.4 MG CAPS capsule Take 1 capsule (0.4 mg total) by mouth daily after supper. (Patient taking differently: Take 0.4 mg by mouth daily. ) 30 capsule 0  . sitaGLIPtin (JANUVIA) 50 MG tablet Take 1 tablet (50 mg total) by mouth daily. (Patient not taking: Reported on 12/18/2017) 10 tablet 0    Home: Home Living Family/patient expects to be discharged to:: Private residence Living Arrangements: Spouse/significant other, Children Available Help at Discharge: Family Type of Home: House Home Access: Stairs to enter Technical brewer of Steps: 7 Entrance Stairs-Rails: Can reach both Home Layout: One level Bathroom Shower/Tub: Tub/shower unit, Architectural technologist: Standard Home Equipment: Environmental consultant - 2 wheels, Environmental consultant - 4 wheels, [place] - single point, Bedside commode, Shower seat, Grab bars - tub/shower  Functional History: Prior Function Level of Independence: Needs assistance Gait / Transfers Assistance Needed: ambulated with cane ADL's / Homemaking Assistance Needed: linda (wife) helps with the showering, cooks but patient reports self dresses(reports linda completes all bathing, pt holding onto grabbar) Functional Status:  Mobility: Bed Mobility Overal bed mobility: Needs Assistance Bed Mobility: Supine to Sit Supine to sit: Mod assist, HOB elevated General bed mobility comments: mod assist for trunk support into sitting at EOB, verbal cueing to reach and use rails  Transfers Overall transfer level: Needs assistance Equipment used: 1 person hand held assist Transfers: Sit to/from Stand Sit to Stand: Min guard, +2 safety/equipment General transfer comment: cueing for hand placement and safety, reliant on UE support  and increased time and effort to perform Ambulation/Gait Ambulation/Gait assistance: Mod assist Gait  Distance (Feet): 12 Feet Assistive device: 1 person hand held assist Gait Pattern/deviations: Decreased stride length, Shuffle, Trunk flexed, Wide base of support General Gait Details: patient with difficulty establishing gait. Noted instability requiring moderate HHA to maintain upright and pacing. Patient reaching out for upright support of opposite extremity Gait velocity: decreased Gait velocity interpretation: <1.31 ft/sec, indicative of household ambulator    ADL: ADL Overall ADL's : Needs assistance/impaired Grooming: Minimal assistance, Standing, Sitting Upper Body Bathing: Moderate assistance, Sitting Lower Body Bathing: Maximal assistance, Sit to/from stand Upper Body Dressing : Moderate assistance, Sitting Lower Body Dressing: Total assistance, Sit to/from stand Toilet Transfer: Min guard, +2 for safety/equipment, Ambulation(simulated to recliner, hand held assist) Toileting- Clothing Manipulation and Hygiene: Minimal assistance, Sit to/from stand General ADL Comments: Pt with slow processing, initation and sequencing. Limited by decreased coordination and strength of R dominant UE.   Cognition: Cognition Overall Cognitive Status: Impaired/Different from baseline Orientation Level: Oriented X4 Cognition Arousal/Alertness: Awake/alert Behavior During Therapy: WFL for tasks assessed/performed Overall Cognitive Status: Impaired/Different from baseline Area of Impairment: Attention, Memory, Following commands, Safety/judgement, Awareness, Problem solving Current Attention Level: Selective Memory: Decreased short-term memory Following Commands: Follows one step commands consistently, Follows one step commands with increased time Safety/Judgement: Decreased awareness of safety, Decreased awareness of deficits Awareness: Emergent Problem Solving: Slow processing, Decreased initiation, Difficulty sequencing, Requires verbal cues  Blood pressure (!) 129/103, pulse 68,  temperature 98.3 F (  36.8 C), temperature source Oral, resp. rate 18, height 5\' 10"  (1.778 m), weight 102.1 kg, SpO2 93 %. Physical Exam  Vitals reviewed. Constitutional: He is oriented to person, place, and time. He appears well-developed.  Obese  HENT:  Head: Normocephalic and atraumatic.  Eyes: EOM are normal. Right eye exhibits no discharge. Left eye exhibits no discharge.  Neck: Normal range of motion. Neck supple. No thyromegaly present.  Cardiovascular: Normal rate, regular rhythm and normal heart sounds.  Respiratory: Effort normal and breath sounds normal. No respiratory distress.  GI: Soft. Bowel sounds are normal. He exhibits no distension.  Musculoskeletal:  No edema or tenderness in extremities  Neurological: He is alert and oriented to person, place, and time.  Follows basic commands Multiple right upper extremity: 3+/5 proximal to distal with apraxia Left upper extremity: 4/5 proximal distal Right lobe: 3+/5 proximal distal Left lower extremity: 3+-4-/5 proximal distal Sensation diminished to light touch RUE/RLE LUE resting tremor  Skin: Skin is warm and dry.  Psychiatric: His speech is slurred. He is slowed.    Results for orders placed or performed during the hospital encounter of 12/18/17 (from the past 24 hour(s))  Hemoglobin A1c     Status: Abnormal   Collection Time: 12/19/17  7:40 AM  Result Value Ref Range   Hgb A1c MFr Bld 7.2 (H) 4.8 - 5.6 %   Mean Plasma Glucose 159.94 mg/dL  Lipid panel     Status: Abnormal   Collection Time: 12/19/17  7:40 AM  Result Value Ref Range   Cholesterol 137 0 - 200 mg/dL   Triglycerides 116 <150 mg/dL   HDL 33 (L) >40 mg/dL   Total CHOL/HDL Ratio 4.2 RATIO   VLDL 23 0 - 40 mg/dL   LDL Cholesterol 81 0 - 99 mg/dL  Glucose, capillary     Status: Abnormal   Collection Time: 12/19/17 11:04 AM  Result Value Ref Range   Glucose-Capillary 189 (H) 70 - 99 mg/dL  Basic metabolic panel     Status: Abnormal   Collection Time:  12/19/17 12:33 PM  Result Value Ref Range   Sodium 138 135 - 145 mmol/L   Potassium 3.4 (L) 3.5 - 5.1 mmol/L   Chloride 104 98 - 111 mmol/L   CO2 21 (L) 22 - 32 mmol/L   Glucose, Bld 179 (H) 70 - 99 mg/dL   BUN 23 8 - 23 mg/dL   Creatinine, Ser 1.50 (H) 0.61 - 1.24 mg/dL   Calcium 9.0 8.9 - 10.3 mg/dL   GFR calc non Af Amer 42 (L) >60 mL/min   GFR calc Af Amer 49 (L) >60 mL/min   Anion gap 13 5 - 15  Glucose, capillary     Status: Abnormal   Collection Time: 12/19/17  4:51 PM  Result Value Ref Range   Glucose-Capillary 112 (H) 70 - 99 mg/dL  Glucose, capillary     Status: Abnormal   Collection Time: 12/19/17  9:35 PM  Result Value Ref Range   Glucose-Capillary 203 (H) 70 - 99 mg/dL   Ct Head Wo Contrast  Result Date: 12/18/2017 CLINICAL DATA:  Right-sided weakness and headache since waking up this morning. EXAM: CT HEAD WITHOUT CONTRAST TECHNIQUE: Contiguous axial images were obtained from the base of the skull through the vertex without intravenous contrast. COMPARISON:  Brain MR dated 12/22/2016 and head CT dated 12/21/2016. FINDINGS: Brain: Stable mildly enlarged ventricles and mildly to moderately enlarged subarachnoid spaces. Minimal patchy white matter low density in both cerebral  hemispheres without significant change. Stable old left caudate lacunar infarct. No intracranial hemorrhage, mass lesion or CT evidence of acute infarction. Vascular: No hyperdense vessel or unexpected calcification. Skull: Normal. Negative for fracture or focal lesion. Sinuses/Orbits: No acute finding. Other: Again demonstrated is a right frontal scalp lipoma measuring 0.2 x 0.5 cm on image number 66 series 4. IMPRESSION: 1. No acute abnormality. 2. Stable mild atrophy, minimal chronic small vessel white matter ischemic changes in both cerebral hemispheres and old left caudate lacunar infarct. 3. Stable right frontal scalp lipoma. Electronically Signed   By: Claudie Revering M.D.   On: 12/18/2017 11:49   Mr  Brain Wo Contrast  Result Date: 12/19/2017 CLINICAL DATA:  Right-sided weakness. EXAM: MRI HEAD WITHOUT CONTRAST MRA HEAD WITHOUT CONTRAST TECHNIQUE: Multiplanar, multiecho pulse sequences of the brain and surrounding structures were obtained without intravenous contrast. Angiographic images of the head were obtained using MRA technique without contrast. COMPARISON:  Head CT 12/18/2017 and MRI 12/22/2016 FINDINGS: MRI HEAD FINDINGS Brain: There are multiple small, acute, predominantly cortically based infarcts in the left cerebral hemisphere involving the parietal lobe, posterior frontal lobe, and occipital lobe. A 4 mm acute infarct is also noted in the right centrum semiovale. There is a small amount of associated petechial hemorrhage in the left parietal lobe. Elsewhere, patchy T2 hyperintensities in the cerebral white matter bilaterally have progressed from the prior MRI and are nonspecific but compatible with moderate chronic small vessel ischemic disease. The ventricles and sulci are within normal limits for age. Small chronic infarcts in the left cerebellum and left basal ganglia are unchanged from the prior MRI. No mass, midline shift, or extra-axial fluid collection is identified. Vascular: Major intracranial vascular flow voids are preserved. Skull and upper cervical spine: Unremarkable bone marrow signal. Sinuses/Orbits: Unremarkable orbits. Trace right mastoid fluid. Clear paranasal sinuses. Other: Unchanged 25 x 5 mm right frontal scalp lipoma. MRA HEAD FINDINGS The study is mildly motion degraded. The visualized distal vertebral arteries are patent to the basilar with the left being dominant. PICA origins were not imaged. Normal SCA origins are visualized bilaterally. The basilar artery is patent without evidence of significant stenosis. There are fetal origins of both PCAs. There is a moderate mid left P2 stenosis. The internal carotid arteries are widely patent from skull base to carotid termini.  ACAs and MCAs are patent without evidence of proximal branch occlusion. The left A1 segment appears diffusely hypoplastic with a possible superimposed moderate focal stenosis distally. There is left M1 segment irregularity without significant stenosis. No aneurysm is identified. IMPRESSION: 1. Multiple small acute infarcts in the left MCA territory and left occipital lobe. 2. Acute lacunar infarct in the right centrum semiovale. 3. Moderate chronic small vessel ischemic disease, progressed from 2018. 4. Patent circle of Willis without major branch occlusion. 5. Moderate left P2 and possible left A1 stenoses. 6. Fetal origin of both PCAs. Electronically Signed   By: Logan Bores M.D.   On: 12/19/2017 16:02   Mr Jodene Nam Head Wo Contrast  Result Date: 12/19/2017 CLINICAL DATA:  Right-sided weakness. EXAM: MRI HEAD WITHOUT CONTRAST MRA HEAD WITHOUT CONTRAST TECHNIQUE: Multiplanar, multiecho pulse sequences of the brain and surrounding structures were obtained without intravenous contrast. Angiographic images of the head were obtained using MRA technique without contrast. COMPARISON:  Head CT 12/18/2017 and MRI 12/22/2016 FINDINGS: MRI HEAD FINDINGS Brain: There are multiple small, acute, predominantly cortically based infarcts in the left cerebral hemisphere involving the parietal lobe, posterior frontal lobe, and occipital  lobe. A 4 mm acute infarct is also noted in the right centrum semiovale. There is a small amount of associated petechial hemorrhage in the left parietal lobe. Elsewhere, patchy T2 hyperintensities in the cerebral white matter bilaterally have progressed from the prior MRI and are nonspecific but compatible with moderate chronic small vessel ischemic disease. The ventricles and sulci are within normal limits for age. Small chronic infarcts in the left cerebellum and left basal ganglia are unchanged from the prior MRI. No mass, midline shift, or extra-axial fluid collection is identified. Vascular:  Major intracranial vascular flow voids are preserved. Skull and upper cervical spine: Unremarkable bone marrow signal. Sinuses/Orbits: Unremarkable orbits. Trace right mastoid fluid. Clear paranasal sinuses. Other: Unchanged 25 x 5 mm right frontal scalp lipoma. MRA HEAD FINDINGS The study is mildly motion degraded. The visualized distal vertebral arteries are patent to the basilar with the left being dominant. PICA origins were not imaged. Normal SCA origins are visualized bilaterally. The basilar artery is patent without evidence of significant stenosis. There are fetal origins of both PCAs. There is a moderate mid left P2 stenosis. The internal carotid arteries are widely patent from skull base to carotid termini. ACAs and MCAs are patent without evidence of proximal branch occlusion. The left A1 segment appears diffusely hypoplastic with a possible superimposed moderate focal stenosis distally. There is left M1 segment irregularity without significant stenosis. No aneurysm is identified. IMPRESSION: 1. Multiple small acute infarcts in the left MCA territory and left occipital lobe. 2. Acute lacunar infarct in the right centrum semiovale. 3. Moderate chronic small vessel ischemic disease, progressed from 2018. 4. Patent circle of Willis without major branch occlusion. 5. Moderate left P2 and possible left A1 stenoses. 6. Fetal origin of both PCAs. Electronically Signed   By: Logan Bores M.D.   On: 12/19/2017 16:02    Assessment/Plan: Diagnosis: Multifocal infarcts Labs and images (see above) independently reviewed.  Records reviewed and summated above. Stroke: Continue secondary stroke prophylaxis and Risk Factor Modification listed below:   Antiplatelet therapy:   Blood Pressure Management:  Continue current medication with prn's with permisive HTN per primary team Statin Agent:   Diabetes management:   Right sided hemiparesis Motor recovery: Fluoxetine   1. Does the need for close, 24 hr/day  medical supervision in concert with the patient's rehab needs make it unreasonable for this patient to be served in a less intensive setting? Yes 2. Co-Morbidities requiring supervision/potential complications: recurrent UTI (cont abx), CVA, Parkinson's disease, CKD stage III (avoid nephrotoxic meds), DM (Monitor in accordance with exercise and adjust meds as necessary), seizure (cont meds), diastolic dysfunction (monitor for signs/symptoms of fluid overload), HTN (monitor and provide prns in accordance with increased physical exertion and pain), hypokalemia (continue to monitor and replete as necessary) 3. Due to safety, skin/wound care, disease management and patient education, does the patient require 24 hr/day rehab nursing? Yes 4. Does the patient require coordinated care of a physician, rehab nurse, PT (1-2 hrs/day, 5 days/week) and OT (1-2 hrs/day, 5 days/week) to address physical and functional deficits in the context of the above medical diagnosis(es)? Yes Addressing deficits in the following areas: balance, endurance, locomotion, strength, transferring, bathing, dressing, toileting and psychosocial support 5. Can the patient actively participate in an intensive therapy program of at least 3 hrs of therapy per day at least 5 days per week? Yes 6. The potential for patient to make measurable gains while on inpatient rehab is excellent 7. Anticipated functional outcomes upon discharge  from inpatient rehab are supervision and min assist  with PT, supervision and min assist with OT, n/a with SLP. 8. Estimated rehab length of stay to reach the above functional goals is: 14-17 days. 9. Anticipated D/C setting: SNF 10. Anticipated post D/C treatments: SNF 11. Overall Rehab/Functional Prognosis: good  RECOMMENDATIONS: This patient's condition is appropriate for continued rehabilitative care in the following setting: Patient will unlikely return to baseline level of functioning after short CIR stay,  which is what is required to return home.  Recommend SNF at this time. Patient has agreed to participate in recommended program. Potentially Note that insurance prior authorization may be required for reimbursement for recommended care.  Comment: Rehab Admissions Coordinator to follow up.   I have personally performed a face to face diagnostic evaluation, including, but not limited to relevant history and physical exam findings, of this patient and developed relevant assessment and plan.  Additionally, I have reviewed and concur with the physician assistant's documentation above.   Delice Lesch, MD, ABPMR Lavon Paganini Angiulli, PA-C 12/20/2017

## 2017-12-20 NOTE — Progress Notes (Addendum)
STROKE TEAM PROGRESS NOTE   SUBJECTIVE (INTERVAL HISTORY) Patient napping in the bed with sz pads on. Anxious for planned CEA tomorrow. Otherwise, neurologically stable. No complaints. Anticipate d/c following CEA.   OBJECTIVE Vitals:   12/19/17 1605 12/19/17 2003 12/20/17 0431 12/20/17 0733  BP: (!) 155/86 (!) 156/77 (!) 129/103 (!) 162/101  Pulse: 66 62 68 93  Resp: 18 16 18 18   Temp: 98.5 F (36.9 C) 97.7 F (36.5 C) 98.3 F (36.8 C) 98.8 F (37.1 C)  TempSrc: Oral Oral Oral Oral  SpO2: 95% 92% 93% 98%  Weight:      Height:        CBC:  Recent Labs  Lab 12/18/17 1026  WBC 7.8  NEUTROABS 5.7  HGB 16.7  HCT 50.5  MCV 97.5  PLT 818    Basic Metabolic Panel:  Recent Labs  Lab 12/18/17 1026 12/19/17 1233  NA 139 138  K 5.4* 3.4*  CL 104 104  CO2 24 21*  GLUCOSE 233* 179*  BUN 32* 23  CREATININE 1.72* 1.50*  CALCIUM 8.9 9.0    Lipid Panel:     Component Value Date/Time   CHOL 137 12/19/2017 0740   CHOL 113 09/06/2015 1003   TRIG 116 12/19/2017 0740   HDL 33 (L) 12/19/2017 0740   HDL 40 09/06/2015 1003   CHOLHDL 4.2 12/19/2017 0740   VLDL 23 12/19/2017 0740   LDLCALC 81 12/19/2017 0740   LDLCALC 40 09/06/2015 1003   HgbA1c:  Lab Results  Component Value Date   HGBA1C 7.2 (H) 12/19/2017   Urine Drug Screen:     Component Value Date/Time   LABOPIA NONE DETECTED 12/18/2017 1211   COCAINSCRNUR NONE DETECTED 12/18/2017 1211   LABBENZ NONE DETECTED 12/18/2017 1211   AMPHETMU NONE DETECTED 12/18/2017 1211   THCU NONE DETECTED 12/18/2017 1211   LABBARB NONE DETECTED 12/18/2017 1211    Alcohol Level     Component Value Date/Time   ETH <10 12/18/2017 1026    IMAGING Ct Head Wo Contrast 12/18/2017 1. No acute abnormality.  2. Stable mild atrophy, minimal chronic small vessel white matter ischemic changes in both cerebral hemispheres and old left caudate lacunar infarct.  3. Stable right frontal scalp lipoma.   Mr Brain 57 Contrast [name]  Head Wo Contrast 12/19/2017 1. Multiple small acute infarcts in the left MCA territory and left occipital lobe. 2. Acute lacunar infarct in the right centrum semiovale. 3. Moderate chronic small vessel ischemic disease, progressed from 2018. 4. Patent circle of Willis without major branch occlusion. 5. Moderate left P2 and possible left A1 stenoses. 6. Fetal origin of both PCAs.   Bilateral Carotid Dopplers  12/18/2017 Preliminary notes--Bilateral carotid duplex exam completed. Right ICA 1-39% stenosis, Left ICA critical stenosis which is 80-99% stenosis. Bilateral vertebral arteries patent with antegrade flow.   Transthoracic Echocardiogram - Left ventricle: The cavity size was normal. There was moderate concentric hypertrophy. Systolic function was normal. The estimated ejection fraction was in the range of 50% to 55%. There is akinesis of the inferoseptal myocardium. Doppler parameters are consistent with abnormal left ventricular relaxation (grade 1 diastolic dysfunction). Doppler parameters are consistent with high ventricular filling pressure. - Aortic valve: Trileaflet; mildly thickened, mildly calcified leaflets. There was trivial regurgitation. - Mitral valve: There was mild regurgitation. - Left atrium: The atrium was mildly dilated. - Right ventricle: The cavity size was mildly dilated. Wall thickness was normal. - Tricuspid valve: There was trivial regurgitation. - Pulmonary rteries: Systolic  pressure was mildly increased. PA peak pressure: 39 mm Hg (S). Impressions:  No cardiac source of emboli was indentified.   PHYSICAL EXAM HEENT-  Normocephalic, no lesions, without obvious abnormality.   Cardiovascular- S1-S2 audible, pulses palpable throughout   Lungs-no rhonchi or wheezing noted, no excessive working breathing.  Saturations within normal limits on RA Extremities- Warm, dry and intact Musculoskeletal- left foot with 2nd and 3rd toe fusion, left hand with resting tremor that  stops with action or when touched. Skin-warm and dry, Bilateral feet with shiny red patches  Neurological Examination Mental Status: Alert, oriented, thought content appropriate. Naming intact, Speech fluent without evidence of aphasia.  Able to follow  commands without difficulty. Cranial Nerves: II:  Visual fields grossly normal,  III,IV, VI: ptosis not present, extra-ocular motions intact bilaterally, pupils equal, round, reactive to light and accommodation V,VII: smile asymmetric, slight right facial droop, facial light touch sensation normal bilaterally VIII: hearing normal bilaterally IX,X: uvula rises symmetrically XI: bilateral shoulder shrug XII: midline tongue extension Motor: Right :  Upper extremity   4+/5 proximal, distal 2/5   Left:      Upper extremity   5/5  Lower extremity   5/5                              Lower extremity   5/5 Tone and bulk:normal tone throughout; no atrophy noted. Decreased FMM R side Sensory: pinprick and  light touch intact throughout, bilaterally Plantars: Right: downgoing                                Left: downgoing Gait: deferred   ASSESSMENT/PLAN Mr. [name] [name] is a 83 y.o. male with history of seizure, DM, tremor (Parkinson's?) and previous stroke presenting with right sided weakness / numbness / facial droop. He did not receive IV t-PA due to late presentation.  Stroke:  Multiple small L MCA infarcts in setting of L ICA stenosis; also solo R MCA infarct. Cannot rule out cardioembolic source  CT head - nothing acute.Old left caudate lacunar infarct.   MRI/MRA head - Multiple small acute infarcts in the left MCA territory and left occipital lobe.  Acute lacunar infarct in the right centrum semiovale. Moderate chronic small vessel ischemic disease, progressed from 2018. Moderate left P2 and possible left A1 stenoses.  Carotid Doppler - Left ICA - 80-99% stenosis.  2D Echo - completed, WNL  LDL - 81  HgbA1c - 7.2  VTE prophylaxis  - [place] Heparin  aspirin 81 mg daily prior to admission, now on aspirin 81 mg daily. Recommend addition of plavix 75 mg daily to aspirin at time of discharge x 3 weeks then plavix alone  Recommend OP tele monitoring x 30 days to rule out atrial fibrillation as source of R MCA stroke  Therapy recommendations:  SNF  Disposition:  Pending  L ICA stenosis  For L CEA 10/8 by [facility] (Dr. Scot Dock)  moderate disc degeneration and disc disease at the C5-6 level with some early spinal cord compression and significant neuroforaminal narrowing on the right at C5-6  Hypertension  Stable . Permissive hypertension (OK if < 220/120) but gradually normalize in 5-7 days . Long-term BP goal normotensive  Hyperlipidemia  Lipid lowering medication PTA:  Zocor 20 mg daily  LDL 81, goal < 70  Current lipid lowering medication: Zocor to 40 mg daily.  Continue statin at discharge  Diabetes  HgbA1c 7.2, goal < 7.0  Uncontrolled  Other Stroke Risk Factors  Advanced age  Obesity, Body mass index is 32.28 kg/m., recommend weight loss, diet and exercise as appropriate   Hx stroke/TIA  Other Active Problems  Hx seizure d/o  Hyperkalemia - Kayexalate  UTI - abx 3/5 today  AKI, stable. BUN - 32 ; Creatinine 1.72  C spine stenosis - Dr Annette Stable consulted. No intervention at this time.  Hospital day # 0  ATTENDING NOTE: I reviewed above note and agree with the assessment and plan. Pt was seen and examined.   83 year old male with history of seizure, diabetes, TIA admitted for right-sided weakness, numbness.  CT head showed old left caudate head lacunar infarct.  Carotid Doppler showed left ICA 80 to 99% stenosis.  MRI showed left MCA scattered infarcts in the right punctate isolated MCA/ACA watershed territory infarct.  MRA left P2 and left A1 stenosis.  EF 50 to 55%.  LDL 81 and A1c 7.2.  Vascular surgery consulted and plan for left CEA tomorrow.  On exam, AAO x3, mild right facial droop, right  upper extremity 4/5 proximal and distal 3/5 with significant dexterity difficulty.  Right lower extremity proximal 4/5 and distal 5/5.  We agree with left CEA, however, due to right MCA/ACA territory pump infarct, also recommend 30-day cardio event monitoring as outpatient to rule out A. fib.  Recommend aspirin 81 and Plavix 75 DAPT for 3 weeks and then aspirin alone.  Continue Keppra and Zocor as home medication.  Neurology will sign off. Please call with questions. Pt will follow up with stroke clinic NP at [facility] in about 4 weeks. Thanks for the consult.   Rosalin Hawking, MD PhD Stroke Neurology 12/20/2017 6:05 PM     To contact Stroke Continuity provider, please refer to [email or web address] After hours, contact General Neurology

## 2017-12-20 NOTE — Evaluation (Signed)
Speech Language Pathology Evaluation Patient Details Name: [name] [name] MRN: [id number] DOB: 04-13-1934 Today's Date: 12/20/2017 Time: 8315-1761 SLP Time Calculation (min) (ACUTE ONLY): 15 min  Problem List:  Patient Active Problem List   Diagnosis Date Noted  . Cerebral embolism with cerebral infarction 12/20/2017  . CVA (cerebral vascular accident) ([place]) 12/20/2017  . Acute ischemic stroke ([place])   . History of CVA (cerebrovascular accident)   . Parkinson disease ([place])   . Diastolic dysfunction   . Hypokalemia   . Right sided weakness 12/18/2017  . Chronic venous insufficiency 05/05/2017  . .0... 12/21/2016  . Weakness 12/21/2016  . Contracture of muscle of right upper arm 12/21/2016  . UTI (urinary tract infection) 12/21/2016  . Seizures ([place])   . Left knee pain 09/06/2015  . Hyperlipidemia associated with type 2 diabetes mellitus ([place]) 09/06/2015  . Tremor 09/06/2015  . Urinary frequency 09/06/2015  . Acute urinary retention 08/12/2015  . Hyperkalemia 08/10/2015  . Acute renal failure ([place]) 08/10/2015  . Acute bilateral obstructive uropathy 08/10/2015  . HTN (hypertension) 08/10/2015  . Diabetes mellitus, type 2 ([place]) 08/10/2015   Past Medical History:  Past Medical History:  Diagnosis Date  . Arthritis   . Diabetes mellitus without complication ([place])    type 2  . Prostate disease   . Renal disorder    kidney stones  . Seizures ([place])    Past Surgical History:  Past Surgical History:  Procedure Laterality Date  . CYSTOSCOPY W/ URETERAL STENT PLACEMENT Bilateral 08/10/2015   Procedure: CYSTOSCOPY WITH BILATERAL RETROGRADE PYELOGRAM/BILATERAL URETERAL STENT PLACEMENT;  Surgeon: Kathie Rhodes, MD;  Location: [facility];  Service: Urology;  Laterality: Bilateral;  . CYSTOSCOPY WITH RETROGRADE PYELOGRAM, URETEROSCOPY AND STENT PLACEMENT Bilateral 09/02/2015   Procedure: CYSTOSCOPY BILATERAL STENT REMOVAL BILATERAL RETROGRADE PYELOGRAM, BILATERAL URETEROSCOPY AND BILATERAL  STENT PLACEMENT;  Surgeon: Kathie Rhodes, MD;  Location: [facility];  Service: Urology;  Laterality: Bilateral;  . HOLMIUM LASER APPLICATION Bilateral 08/20/3708   Procedure: BILATERAL HOLMIUM LASER  LITHO ;  Surgeon: Kathie Rhodes, MD;  Location: [facility];  Service: Urology;  Laterality: Bilateral;  . JOINT REPLACEMENT Right 2007   hip, Dr. Alvan Dame  . TONSILLECTOMY Bilateral    70 years ago   HPI:  83 y.o. right-handed male with history of recurrent UTI, CVA, Parkinson's disease, CKD stage III, diabetes mellitus and seizure disorder maintained on Keppra. Presented 10/5 with right hemiparesis and HA; MRI shows  multiple small acute infarcts in the left MCA territory and left occipital lobe.  Acute lacunar infarction in the right centrum semi-ovale. Carotid dopplers revealed left ICA stenosis 80-99%; pt scheduled for L CEA 10/8.    Assessment / Plan / Recommendation Clinical Impression  Pt presents with clear, fluent speech; follows three-step commands and demonstrates functional comprehension after listening to paragraph and answering questions.  Reads aloud without deficit. Repetition; naming intact.  Pt demonstrates mild difficulty with storage/recall of new information; impaired insight. Baseline unknown -  No acute care SLP needs are identified - pt may benefit from further assessment in next level of care.     SLP Assessment  SLP Recommendation/Assessment: All further Speech Lanaguage Pathology  needs can be addressed in the next venue of care SLP Visit Diagnosis: Cognitive communication deficit (R41.841)    Follow Up Recommendations       Frequency and Duration           SLP Evaluation Cognition  Overall Cognitive Status: Difficult to assess Arousal/Alertness: Awake/alert Orientation Level: Oriented  X4 Attention: Sustained Sustained Attention: Appears intact Memory: Impaired Memory Impairment: Retrieval deficit;Decreased short term memory Decreased Short Term Memory: Verbal  basic Awareness: Impaired Awareness Impairment: Intellectual impairment       Comprehension  Auditory Comprehension Overall Auditory Comprehension: Appears within functional limits for tasks assessed Yes/No Questions: Within Functional Limits Commands: Within Functional Limits Conversation: Simple Reading Comprehension Reading Status: Within funtional limits    Expression Expression Primary Mode of Expression: Verbal Verbal Expression Overall Verbal Expression: Appears within functional limits for tasks assessed Level of Generative/Spontaneous Verbalization: Conversation Repetition: No impairment Naming: No impairment Pragmatics: No impairment   Oral / Motor  Oral Motor/Sensory Function Overall Oral Motor/Sensory Function: Within functional limits Motor Speech Overall Motor Speech: Appears within functional limits for tasks assessed   GO                    [name] [name] 12/20/2017, 1:09 PM [name] [name] L. Tivis Ringer, [place] Office number [phone] Pager [phone]

## 2017-12-20 NOTE — Progress Notes (Signed)
Inpatient Rehabilitation-Admissions Coordinator   Please see consult note by PM&R MD for details. At this time, we are recommending SNF as pt will most likely need a longer term rehab stay in order to return home safely.  AC will sign off.   Please call if questions.   Jhonnie Garner, OTR/L  Rehab Admissions Coordinator  [phone] 12/20/2017 1:45 PM

## 2017-12-20 NOTE — Progress Notes (Signed)
PROGRESS NOTE  [name] [name] HKV:[id number] DOB: [birth year] DOA: 12/18/2017 PCP: Clovia Cuff, MD   LOS: 0 days   Brief Narrative / Interim history: 83 year old male with history of prior CVA, Parkinson's disease, recurrent UTIs, seizures, hypertension, chronic kidney disease stage III, diabetes mellitus who was admitted to the hospital on 10/5 with right-sided weakness when he woke up in the morning.  He also was weak in the right leg and also reported some tingling and numbness.  Neurology was consulted on admission  Subjective: -no complaints this morning, no chest pain, palpitation, no shortness of breath. No abdominal pain, nausea or vomiting.   Assessment & Plan: Principal Problem:   Right sided weakness Active Problems:   Hyperkalemia   Acute renal failure (HCC)   HTN (hypertension)   Diabetes mellitus, type 2 (HCC)   Hyperlipidemia associated with type 2 diabetes mellitus (HCC)   Seizures (HCC)   UTI (urinary tract infection)   Cerebral embolism with cerebral infarction   CVA (cerebral vascular accident) ([place])   Right-sided weakness/acute CVA -Neurology following, appreciate input, will complete work-up -MRI of the brain with multiple small infarcts in left MCA and left occipital lobe territories  -2D echo with normal EF, full results below -Hemoglobin A1c 7.2 which is acceptable for his age group, lipid panel shows an LDL of 69, he is on a statin  Left ICA critical stenosis -This was seen on the carotid Doppler, vascular surgery consulted, tentative endarterectomy tomorrow  Diabetes mellitus -CBGs under control, SSI  Hyperlipidemia -On statin  History of seizure disorder -Continue home Keppra  Presumed UTI -cultures with E coli sst to ceftriaxone only, continue for total of 5 days, today day 3/5  Acute kidney injury chronic kidney disease stage III -1.7 on admission increased from baseline 1.3-1.5, Cr stable    Scheduled Meds: .  stroke: mapping our  early stages of recovery book   Does not apply Once  . aspirin EC  81 mg Oral Daily  . finasteride  5 mg Oral Daily  . heparin  5,000 Units Subcutaneous Q8H  . Influenza vac split quadrivalent PF  0.5 mL Intramuscular Tomorrow-1000  . insulin aspart  0-5 Units Subcutaneous QHS  . insulin aspart  0-9 Units Subcutaneous TID WC  . levETIRAcetam  500 mg Oral BID  . simvastatin  40 mg Oral Daily  . tamsulosin  0.4 mg Oral QPC supper   Continuous Infusions: . sodium chloride 75 mL/hr at 12/19/17 0606  . [START ON 12/21/2017]  ceFAZolin (ANCEF) IV    . cefTRIAXone (ROCEPHIN)  IV 1 g (12/19/17 1141)   PRN Meds:.acetaminophen **OR** acetaminophen (TYLENOL) oral liquid 160 mg/5 mL **OR** acetaminophen, senna-docusate  DVT prophylaxis: Heparin Code Status: Full code Family Communication: No family present at bedside Disposition Plan: To be determined  Consultants:   Neurology  Procedures:   2D echo:  Study Conclusions  - Left ventricle: The cavity size was normal. There was moderate   concentric hypertrophy. Systolic function was normal. The   estimated ejection fraction was in the range of 50% to 55%. There   is akinesis of the inferoseptal myocardium. Doppler parameters   are consistent with abnormal left ventricular relaxation (grade 1   diastolic dysfunction). Doppler parameters are consistent with   high ventricular filling pressure. - Aortic valve: Trileaflet; mildly thickened, mildly calcified   leaflets. There was trivial regurgitation. - Mitral valve: There was mild regurgitation. - Left atrium: The atrium was mildly dilated. - Right ventricle: The cavity  size was mildly dilated. Wall   thickness was normal. - Tricuspid valve: There was trivial regurgitation. - Pulmonary arteries: Systolic pressure was mildly increased. PA   peak pressure: 39 mm Hg (S).  Antimicrobials:  Ceftriaxone 10/5 >>  Objective: Vitals:   12/19/17 1605 12/19/17 2003 12/20/17 0431 12/20/17  0733  BP: (!) 155/86 (!) 156/77 (!) 129/103 (!) 162/101  Pulse: 66 62 68 93  Resp: 18 16 18 18   Temp: 98.5 F (36.9 C) 97.7 F (36.5 C) 98.3 F (36.8 C) 98.8 F (37.1 C)  TempSrc: Oral Oral Oral Oral  SpO2: 95% 92% 93% 98%  Weight:      Height:        Intake/Output Summary (Last 24 hours) at 12/20/2017 1015 Last data filed at 12/20/2017 0800 Gross per 24 hour  Intake 1700.92 ml  Output 1475 ml  Net 225.92 ml   Filed Weights   12/18/17 1000  Weight: 102.1 kg    Examination:  Constitutional: NAD Eyes: no scleral icterus  ENMT: mmm Neck: normal, supple Respiratory: cta biL, no wheezing, no crackles  Cardiovascular: RRR without murmurs, no edema, good pulses  Abdomen: soft, nt, nd, bs+ Skin: no rashes seen Neurologic: unchanged resting tremor, equal strength  Psychiatric: AxOx3, appropriate    Data Reviewed: I have independently reviewed following labs and imaging studies   CBC: Recent Labs  Lab 12/18/17 1026  WBC 7.8  NEUTROABS 5.7  HGB 16.7  HCT 50.5  MCV 97.5  PLT 676   Basic Metabolic Panel: Recent Labs  Lab 12/18/17 1026 12/19/17 1233  NA 139 138  K 5.4* 3.4*  CL 104 104  CO2 24 21*  GLUCOSE 233* 179*  BUN 32* 23  CREATININE 1.72* 1.50*  CALCIUM 8.9 9.0   GFR: Estimated Creatinine Clearance: 47 mL/min (A) (by C-G formula based on SCr of 1.5 mg/dL (H)). Liver Function Tests: Recent Labs  Lab 12/18/17 1026  AST 31  ALT 14  ALKPHOS 88  BILITOT 1.5*  PROT 6.7  ALBUMIN 3.8   No results for input(s): LIPASE, AMYLASE in the last 168 hours. No results for input(s): AMMONIA in the last 168 hours. Coagulation Profile: Recent Labs  Lab 12/18/17 1026  INR 1.10   Cardiac Enzymes: No results for input(s): CKTOTAL, CKMB, CKMBINDEX, TROPONINI in the last 168 hours. BNP (last 3 results) No results for input(s): PROBNP in the last 8760 hours. HbA1C: Recent Labs    12/19/17 0740  HGBA1C 7.2*   CBG: Recent Labs  Lab 12/19/17 0608  12/19/17 1104 12/19/17 1651 12/19/17 2135 12/20/17 0619  GLUCAP 140* 189* 112* 203* 127*   Lipid Profile: Recent Labs    12/19/17 0740  CHOL 137  HDL 33*  LDLCALC 81  TRIG 116  CHOLHDL 4.2   Thyroid Function Tests: No results for input(s): TSH, T4TOTAL, FREET4, T3FREE, THYROIDAB in the last 72 hours. Anemia Panel: No results for input(s): VITAMINB12, FOLATE, FERRITIN, TIBC, IRON, RETICCTPCT in the last 72 hours. Urine analysis:    Component Value Date/Time   COLORURINE YELLOW 12/18/2017 1211   APPEARANCEUR CLOUDY (A) 12/18/2017 1211   LABSPEC 1.015 12/18/2017 1211   PHURINE 5.0 12/18/2017 1211   GLUCOSEU NEGATIVE 12/18/2017 1211   HGBUR MODERATE (A) 12/18/2017 1211   BILIRUBINUR NEGATIVE 12/18/2017 1211   KETONESUR NEGATIVE 12/18/2017 1211   PROTEINUR NEGATIVE 12/18/2017 1211   UROBILINOGEN 0.2 03/14/2009 1129   NITRITE POSITIVE (A) 12/18/2017 1211   LEUKOCYTESUR LARGE (A) 12/18/2017 1211   Sepsis Labs: Invalid  input(s): PROCALCITONIN, LACTICIDVEN  Recent Results (from the past 240 hour(s))  Urine culture     Status: Abnormal   Collection Time: 12/18/17  1:25 PM  Result Value Ref Range Status   Specimen Description URINE, RANDOM  Final   Special Requests   Final    NONE Performed at [facility], [address], [place], [place] [address]    Culture >=100,000 COLONIES/mL ESCHERICHIA COLI (A)  Final   Report Status 12/20/2017 FINAL  Final   Organism ID, Bacteria ESCHERICHIA COLI (A)  Final      Susceptibility   Escherichia coli - MIC*    AMPICILLIN >=32 RESISTANT Resistant     CEFAZOLIN >=64 RESISTANT Resistant     CEFTRIAXONE <=1 SENSITIVE Sensitive     CIPROFLOXACIN >=4 RESISTANT Resistant     GENTAMICIN >=16 RESISTANT Resistant     IMIPENEM <=0.25 SENSITIVE Sensitive     NITROFURANTOIN 64 INTERMEDIATE Intermediate     TRIMETH/SULFA >=320 RESISTANT Resistant     AMPICILLIN/SULBACTAM >=32 RESISTANT Resistant     PIP/TAZO 64 INTERMEDIATE  Intermediate     Extended ESBL NEGATIVE Sensitive     * >=100,000 COLONIES/mL ESCHERICHIA COLI      Radiology Studies: Ct Head Wo Contrast  Result Date: 12/18/2017 CLINICAL DATA:  Right-sided weakness and headache since waking up this morning. EXAM: CT HEAD WITHOUT CONTRAST TECHNIQUE: Contiguous axial images were obtained from the base of the skull through the vertex without intravenous contrast. COMPARISON:  Brain MR dated 12/22/2016 and head CT dated 12/21/2016. FINDINGS: Brain: Stable mildly enlarged ventricles and mildly to moderately enlarged subarachnoid spaces. Minimal patchy white matter low density in both cerebral hemispheres without significant change. Stable old left caudate lacunar infarct. No intracranial hemorrhage, mass lesion or CT evidence of acute infarction. Vascular: No hyperdense vessel or unexpected calcification. Skull: Normal. Negative for fracture or focal lesion. Sinuses/Orbits: No acute finding. Other: Again demonstrated is a right frontal scalp lipoma measuring 0.2 x 0.5 cm on image number 66 series 4. IMPRESSION: 1. No acute abnormality. 2. Stable mild atrophy, minimal chronic small vessel white matter ischemic changes in both cerebral hemispheres and old left caudate lacunar infarct. 3. Stable right frontal scalp lipoma. Electronically Signed   By: Claudie Revering M.D.   On: 12/18/2017 11:49   Mr Brain Wo Contrast  Result Date: 12/19/2017 CLINICAL DATA:  Right-sided weakness. EXAM: MRI HEAD WITHOUT CONTRAST MRA HEAD WITHOUT CONTRAST TECHNIQUE: Multiplanar, multiecho pulse sequences of the brain and surrounding structures were obtained without intravenous contrast. Angiographic images of the head were obtained using MRA technique without contrast. COMPARISON:  Head CT 12/18/2017 and MRI 12/22/2016 FINDINGS: MRI HEAD FINDINGS Brain: There are multiple small, acute, predominantly cortically based infarcts in the left cerebral hemisphere involving the parietal lobe, posterior  frontal lobe, and occipital lobe. A 4 mm acute infarct is also noted in the right centrum semiovale. There is a small amount of associated petechial hemorrhage in the left parietal lobe. Elsewhere, patchy T2 hyperintensities in the cerebral white matter bilaterally have progressed from the prior MRI and are nonspecific but compatible with moderate chronic small vessel ischemic disease. The ventricles and sulci are within normal limits for age. Small chronic infarcts in the left cerebellum and left basal ganglia are unchanged from the prior MRI. No mass, midline shift, or extra-axial fluid collection is identified. Vascular: Major intracranial vascular flow voids are preserved. Skull and upper cervical spine: Unremarkable bone marrow signal. Sinuses/Orbits: Unremarkable orbits. Trace right mastoid fluid.  Clear paranasal sinuses. Other: Unchanged 25 x 5 mm right frontal scalp lipoma. MRA HEAD FINDINGS The study is mildly motion degraded. The visualized distal vertebral arteries are patent to the basilar with the left being dominant. PICA origins were not imaged. Normal SCA origins are visualized bilaterally. The basilar artery is patent without evidence of significant stenosis. There are fetal origins of both PCAs. There is a moderate mid left P2 stenosis. The internal carotid arteries are widely patent from skull base to carotid termini. ACAs and MCAs are patent without evidence of proximal branch occlusion. The left A1 segment appears diffusely hypoplastic with a possible superimposed moderate focal stenosis distally. There is left M1 segment irregularity without significant stenosis. No aneurysm is identified. IMPRESSION: 1. Multiple small acute infarcts in the left MCA territory and left occipital lobe. 2. Acute lacunar infarct in the right centrum semiovale. 3. Moderate chronic small vessel ischemic disease, progressed from 2018. 4. Patent circle of Willis without major branch occlusion. 5. Moderate left P2 and  possible left A1 stenoses. 6. Fetal origin of both PCAs. Electronically Signed   By: Logan Bores M.D.   On: 12/19/2017 16:02   Mr Jodene Nam Head Wo Contrast  Result Date: 12/19/2017 CLINICAL DATA:  Right-sided weakness. EXAM: MRI HEAD WITHOUT CONTRAST MRA HEAD WITHOUT CONTRAST TECHNIQUE: Multiplanar, multiecho pulse sequences of the brain and surrounding structures were obtained without intravenous contrast. Angiographic images of the head were obtained using MRA technique without contrast. COMPARISON:  Head CT 12/18/2017 and MRI 12/22/2016 FINDINGS: MRI HEAD FINDINGS Brain: There are multiple small, acute, predominantly cortically based infarcts in the left cerebral hemisphere involving the parietal lobe, posterior frontal lobe, and occipital lobe. A 4 mm acute infarct is also noted in the right centrum semiovale. There is a small amount of associated petechial hemorrhage in the left parietal lobe. Elsewhere, patchy T2 hyperintensities in the cerebral white matter bilaterally have progressed from the prior MRI and are nonspecific but compatible with moderate chronic small vessel ischemic disease. The ventricles and sulci are within normal limits for age. Small chronic infarcts in the left cerebellum and left basal ganglia are unchanged from the prior MRI. No mass, midline shift, or extra-axial fluid collection is identified. Vascular: Major intracranial vascular flow voids are preserved. Skull and upper cervical spine: Unremarkable bone marrow signal. Sinuses/Orbits: Unremarkable orbits. Trace right mastoid fluid. Clear paranasal sinuses. Other: Unchanged 25 x 5 mm right frontal scalp lipoma. MRA HEAD FINDINGS The study is mildly motion degraded. The visualized distal vertebral arteries are patent to the basilar with the left being dominant. PICA origins were not imaged. Normal SCA origins are visualized bilaterally. The basilar artery is patent without evidence of significant stenosis. There are fetal origins of  both PCAs. There is a moderate mid left P2 stenosis. The internal carotid arteries are widely patent from skull base to carotid termini. ACAs and MCAs are patent without evidence of proximal branch occlusion. The left A1 segment appears diffusely hypoplastic with a possible superimposed moderate focal stenosis distally. There is left M1 segment irregularity without significant stenosis. No aneurysm is identified. IMPRESSION: 1. Multiple small acute infarcts in the left MCA territory and left occipital lobe. 2. Acute lacunar infarct in the right centrum semiovale. 3. Moderate chronic small vessel ischemic disease, progressed from 2018. 4. Patent circle of Willis without major branch occlusion. 5. Moderate left P2 and possible left A1 stenoses. 6. Fetal origin of both PCAs. Electronically Signed   By: Logan Bores M.D.   On:  12/19/2017 16:02     Marzetta Board, MD, PhD [facility] Pager [phone]  If 7PM-7AM, please contact night-coverage [email or web address] Password [email or web address] 12/20/2017, 10:15 AM

## 2017-12-20 NOTE — Progress Notes (Signed)
Occupational Therapy Treatment Patient Details Name: [name] [name] MRN: [id number] DOB: Aug 29, 1934 Today's Date: 12/20/2017    History of present illness Pt is a 83 y.o. male, past medical history of recurrent UTIs, seizures, CVA, Parkinson disease, hypertension, CKD stage III, Beatties mellitus, presents with complaints of right-sided weakness, numbness and tingling, and headache. Carotid doppler positive for signifincat left ICA critical stensosis 80-99%, plan for L carotid endarterectomy Tuesday, CT negative, MRI scheduled.    OT comments  This 83 yo male seen in partial conjunction with PT which was good due to pt not moving as good as he was yesterday when up on his feet. +2 Mod A today (+2 min guard A yesterday), Mod A to ambulate yesterday (+2 Mod A to ambulate today with heavy lean to right). Pt will continue to benefit from acute OT with follow up OT at SNF to work back towards his PLOF of Mod I with toileting and dressing with some A for bathing from wife. Pt very motivated to do all he can.  Follow Up Recommendations  CIR    Equipment Recommendations  Other (comment)(TBD at next level)       Precautions / Restrictions Precautions Precautions: Fall Precaution Comments: L ankle monitor  Restrictions Weight Bearing Restrictions: No       Mobility Bed Mobility Overal bed mobility: Needs Assistance Bed Mobility: Supine to Sit     Supine to sit: Mod assist;HOB elevated     General bed mobility comments: Pt able to use Bil LEs and LUE on rail to shift himself around to get legs off of bed; but then could not come up with trunk. Very stiff.   Transfers Overall transfer level: Needs assistance Equipment used: Rolling walker (2 wheeled);None Transfers: Sit to/from Stand           General transfer comment: +2 Mod A for safety sit>stand from bed (with A to hold RUE on RW and increased time), 2nd time at sink with A to place and hold RUE on counter top with increased time  sit>stand    Balance Overall balance assessment: Needs assistance Sitting-balance support: Feet supported;Single extremity supported   Sitting balance - Comments: pt was not able to keep RUE on bed to support himself, needed VCs to hold footboard of bed with LUE to keep himself in midline (tendency to lean right)   Standing balance support: Bilateral upper extremity supported;During functional activity Standing balance-Leahy Scale: Poor Standing balance comment: relaint on B UE support, stood at sink and worked on propping through [place] and stepping in place alternately with Bil LEs (pt tended to narrow his base of support when he would pick up his RLE and place back down)                           ADL either performed or assessed with clinical judgement   ADL Overall ADL's : Needs assistance/impaired     Grooming: Oral care;Set up;Supervision/safety;Sitting Grooming Details (indicate cue type and reason): at sink, when I intially handed toothpaste to pt he reached with it towards his mouth and started squeezing it as if to squeeze toothpaste directly into his mouth. When asked if he normally does this he said no, so he placed toothpaste on toothbrush he was holding in his right hand. He brought toothbrush to mouth and started brushing teeth by holding toothbrush still and moving his head around--VCs to try and move toothbrush with hand not move  his head so that he works his RUE more.             Lower Body Dressing: Total assistance Lower Body Dressing Details (indicate cue type and reason): for socks with +2 Mod A sit>stand (for safety) from bed (increased time), forward stooped posture Toilet Transfer: Maximal assistance;+2 for physical assistance Toilet Transfer Details (indicate cue type and reason): ambulate 5 feet to sit in recliner behind him; heavy lean to right with tendency for stooped posture                 Vision Baseline Vision/History: Wears  glasses Wears Glasses: Reading only            Cognition Arousal/Alertness: Awake/alert Behavior During Therapy: WFL for tasks assessed/performed Overall Cognitive Status: Impaired/Different from baseline Area of Impairment: Safety/judgement;Problem solving;Following commands;Attention;Awareness                   Current Attention Level: Selective   Following Commands: Follows one step commands with increased time Safety/Judgement: Decreased awareness of safety;Decreased awareness of deficits Awareness: Emergent Problem Solving: Slow processing;Decreased initiation;Difficulty sequencing;Requires verbal cues                     Pertinent Vitals/ Pain       Pain Assessment: No/denies pain  Home Living Family/patient expects to be discharged to:: Private residence Living Arrangements: Spouse/significant other;Children Available Help at Discharge: Family Type of Home: House Home Access: Stairs to enter Technical brewer of Steps: 7 Entrance Stairs-Rails: Can reach both Home Layout: One level                      Lives With: Spouse    Prior Functioning/Environment              Frequency  Min 2X/week        Progress Toward Goals  OT Goals(current goals can now be found in the care plan section)  Progress towards OT goals: Not progressing toward goals - comment     Plan Discharge plan needs to be updated    Co-evaluation    PT/OT/SLP Co-Evaluation/Treatment: Yes(partial) Reason for Co-Treatment: For patient/therapist safety   OT goals addressed during session: ADL's and self-care;Strengthening/ROM      AM-PAC PT "6 Clicks" Daily Activity     Outcome Measure   Help from another person eating meals?: A Little Help from another person taking care of personal grooming?: A Lot Help from another person toileting, which includes using toliet, bedpan, or urinal?: Total Help from another person bathing (including washing, rinsing,  drying)?: A Lot Help from another person to put on and taking off regular upper body clothing?: A Lot Help from another person to put on and taking off regular lower body clothing?: Total 6 Click Score: 11    End of Session Equipment Utilized During Treatment: Gait belt;Rolling walker  OT Visit Diagnosis: Unsteadiness on feet (R26.81);Other abnormalities of gait and mobility (R26.89);Muscle weakness (generalized) (M62.81);Other symptoms and signs involving cognitive function;Hemiplegia and hemiparesis Hemiplegia - Right/Left: Right Hemiplegia - dominant/non-dominant: Dominant Hemiplegia - caused by: Other cerebrovascular disease   Activity Tolerance Patient limited by fatigue(tendency to have increaed stooped posture the longer on his feet with correcting only with VCs)   Patient Left in chair;with call bell/phone within reach;with chair alarm set   Nurse Communication (NT: back with Stedy)        Time: 7425-9563 OT Time Calculation (min): 30 min  Charges: OT  General Charges $OT Visit: 1 Visit OT Treatments $Self Care/Home Management : 8-22 mins  [place], OTR/L Acute [employer] Pager [phone] Office [phone]

## 2017-12-20 NOTE — Progress Notes (Signed)
   VASCULAR SURGERY ASSESSMENT & PLAN:   GREATER THAN 80% SYMPTOMATIC LEFT CAROTID STENOSIS: Patient is scheduled for left carotid endarterectomy tomorrow.  I have previously discussed the indications for the procedure and the potential complications and he is agreeable to proceed.  He does have some persistent right upper extremity weakness and also unsteady gait.  He has been evaluated by Dr. Annette Stable and does have some evidence of cervical disc disease.  We will obviously be careful and positioning his neck.  SUBJECTIVE:   No complaints this morning.  PHYSICAL EXAM:   Vitals:   12/19/17 1229 12/19/17 1605 12/19/17 2003 12/20/17 0431  BP: 118/77 (!) 155/86 (!) 156/77 (!) 129/103  Pulse: 75 66 62 68  Resp: 18 18 16 18   Temp: 98.7 F (37.1 C) 98.5 F (36.9 C) 97.7 F (36.5 C) 98.3 F (36.8 C)  TempSrc: Oral Oral Oral Oral  SpO2: 93% 95% 92% 93%  Weight:      Height:       NEURO: Mild weakness to grip in the right upper extremity.  LABS:   Lab Results  Component Value Date   WBC 7.8 12/18/2017   HGB 16.7 12/18/2017   HCT 50.5 12/18/2017   MCV 97.5 12/18/2017   PLT 163 12/18/2017   Lab Results  Component Value Date   CREATININE 1.50 (H) 12/19/2017   Lab Results  Component Value Date   INR 1.10 12/18/2017   CBG (last 3)  Recent Labs    12/19/17 1651 12/19/17 2135 12/20/17 0619  GLUCAP 112* 203* 127*    PROBLEM LIST:    Principal Problem:   Right sided weakness Active Problems:   Hyperkalemia   Acute renal failure (HCC)   HTN (hypertension)   Diabetes mellitus, type 2 (HCC)   Hyperlipidemia associated with type 2 diabetes mellitus (HCC)   Seizures ([place])   UTI (urinary tract infection)   Cerebral embolism with cerebral infarction   CURRENT MEDS:   .  stroke: mapping our early stages of recovery book   Does not apply Once  . aspirin EC  81 mg Oral Daily  . finasteride  5 mg Oral Daily  . heparin  5,000 Units Subcutaneous Q8H  . Influenza vac split  quadrivalent PF  0.5 mL Intramuscular Tomorrow-1000  . insulin aspart  0-5 Units Subcutaneous QHS  . insulin aspart  0-9 Units Subcutaneous TID WC  . levETIRAcetam  500 mg Oral BID  . simvastatin  40 mg Oral Daily  . tamsulosin  0.4 mg Oral QPC supper    Deitra Mayo Beeper: [phone] Office: [phone] 12/20/2017

## 2017-12-20 NOTE — Progress Notes (Signed)
Physical Therapy Treatment Patient Details Name: [name] [name] MRN: [id number] DOB: 1934/05/29 Today's Date: 12/20/2017    History of Present Illness Pt is a 83 y.o. male, past medical history of recurrent UTIs, seizures, CVA, Parkinson disease, hypertension, CKD stage III, Beatties mellitus, presents with complaints of right-sided weakness, numbness and tingling, and headache. Carotid doppler positive for signifincat left ICA critical stensosis 80-99%, plan for L carotid endarterectomy Tuesday, CT negative, MRI scheduled.     PT Comments    Patient with further decline in physical abilities today. More notable weakness and inability to mobilize safely without 2 person moderate to maximal assist level. Patient with some noted deficits in processing and safety awareness this session. At this time, will continue therapies to progress functional status as tolerated. CIR denial therefore will need SNF.   Follow Up Recommendations  SNF(denied from CIR)     Equipment Recommendations  (TBD)    Recommendations for Other Services Rehab consult     Precautions / Restrictions Precautions Precautions: Fall Precaution Comments: L ankle monitor  Restrictions Weight Bearing Restrictions: No    Mobility  Bed Mobility Overal bed mobility: Needs Assistance Bed Mobility: Supine to Sit     Supine to sit: Mod assist;HOB elevated     General bed mobility comments: Pt able to use Bil LEs and LUE on rail to shift himself around to get legs off of bed; but then could not come up with trunk. Very stiff.   Transfers Overall transfer level: Needs assistance Equipment used: Rolling walker (2 wheeled);None Transfers: Sit to/from Stand           General transfer comment: +2 Mod A for safety sit>stand from bed (with A to hold RUE on RW and increased time), 2nd time at sink with A to place and hold RUE on counter top with increased time sit>stand  Ambulation/Gait Ambulation/Gait assistance: Max  assist;+2 physical assistance Gait Distance (Feet): 3 Feet Assistive device: 2 person hand held assist(attempted with RW, unsuccessful today) Gait Pattern/deviations: Decreased stride length;Shuffle;Trunk flexed;Wide base of support Gait velocity: decreased   General Gait Details: patient more limited in overall functional abiity today   Stairs             Wheelchair Mobility    Modified Rankin (Stroke Patients Only) Modified Rankin (Stroke Patients Only) Pre-Morbid Rankin Score: No significant disability Modified Rankin: Moderately severe disability     Balance Overall balance assessment: Needs assistance Sitting-balance support: Feet supported;Single extremity supported   Sitting balance - Comments: pt was not able to keep RUE on bed to support himself, needed VCs to hold footboard of bed with LUE to keep himself in midline (tendency to lean right)   Standing balance support: Bilateral upper extremity supported;During functional activity Standing balance-Leahy Scale: Poor Standing balance comment: relaint on B UE support, stood at sink and worked on propping through [name] [name] and stepping in place alternately with Bil LEs (pt tended to narrow his base of support when he would pick up his RLE and place back down)                            Cognition Arousal/Alertness: Awake/alert Behavior During Therapy: WFL for tasks assessed/performed Overall Cognitive Status: Impaired/Different from baseline Area of Impairment: Safety/judgement;Problem solving;Following commands;Attention;Awareness                   Current Attention Level: Selective   Following Commands: Follows one step commands  with increased time Safety/Judgement: Decreased awareness of safety;Decreased awareness of deficits Awareness: Emergent Problem Solving: Slow processing;Decreased initiation;Difficulty sequencing;Requires verbal cues        Exercises      General Comments         Pertinent Vitals/Pain Pain Assessment: No/denies pain    Home Living Family/patient expects to be discharged to:: Private residence Living Arrangements: Spouse/significant other;Children Available Help at Discharge: Family Type of Home: House Home Access: Stairs to enter Entrance Stairs-Rails: Can reach both Home Layout: One level        Prior Function            PT Goals (current goals can now be found in the care plan section) Acute Rehab PT Goals PT Goal Formulation: With patient Time For Goal Achievement: 01/02/18 Potential to Achieve Goals: Good Progress towards PT goals: Not progressing toward goals - comment(patient much more limited overall toady)    Frequency    Min 3X/week      PT Plan Current plan remains appropriate    Co-evaluation   Reason for Co-Treatment: For patient/therapist safety   OT goals addressed during session: ADL's and self-care;Strengthening/ROM      AM-PAC PT "6 Clicks" Daily Activity  Outcome Measure  Difficulty turning over in bed (including adjusting bedclothes, sheets and blankets)?: Unable Difficulty moving from lying on back to sitting on the side of the bed? : Unable Difficulty sitting down on and standing up from a chair with arms (e.g., wheelchair, bedside commode, etc,.)?: Unable Help needed moving to and from a bed to chair (including a wheelchair)?: A Lot Help needed walking in hospital room?: A Lot Help needed climbing 3-5 steps with a railing? : Total 6 Click Score: 8    End of Session Equipment Utilized During Treatment: Gait belt Activity Tolerance: Patient tolerated treatment well Patient left: in chair;with call bell/phone within reach;with chair alarm set Nurse Communication: Mobility status PT Visit Diagnosis: Other symptoms and signs involving the nervous system (R29.898);Difficulty in walking, not elsewhere classified (R26.2)     Time: 7893-8101 PT Time Calculation (min) (ACUTE ONLY): 17  min  Charges:  $Therapeutic Activity: 8-22 mins                     Alben Deeds, PT DPT  Board Certified Neurologic Specialist Acute Rehabilitation Services Pager [phone] Office [phone]    Duncan Dull 12/20/2017, 3:03 PM

## 2017-12-20 NOTE — H&P (View-Only) (Signed)
   VASCULAR SURGERY ASSESSMENT & PLAN:   GREATER THAN 80% SYMPTOMATIC LEFT CAROTID STENOSIS: Patient is scheduled for left carotid endarterectomy tomorrow.  I have previously discussed the indications for the procedure and the potential complications and he is agreeable to proceed.  He does have some persistent right upper extremity weakness and also unsteady gait.  He has been evaluated by Dr. Annette Stable and does have some evidence of cervical disc disease.  We will obviously be careful and positioning his neck.  SUBJECTIVE:   No complaints this morning.  PHYSICAL EXAM:   Vitals:   12/19/17 1229 12/19/17 1605 12/19/17 2003 12/20/17 0431  BP: 118/77 (!) 155/86 (!) 156/77 (!) 129/103  Pulse: 75 66 62 68  Resp: 18 18 16 18   Temp: 98.7 F (37.1 C) 98.5 F (36.9 C) 97.7 F (36.5 C) 98.3 F (36.8 C)  TempSrc: Oral Oral Oral Oral  SpO2: 93% 95% 92% 93%  Weight:      Height:       NEURO: Mild weakness to grip in the right upper extremity.  LABS:   Lab Results  Component Value Date   WBC 7.8 12/18/2017   HGB 16.7 12/18/2017   HCT 50.5 12/18/2017   MCV 97.5 12/18/2017   PLT 163 12/18/2017   Lab Results  Component Value Date   CREATININE 1.50 (H) 12/19/2017   Lab Results  Component Value Date   INR 1.10 12/18/2017   CBG (last 3)  Recent Labs    12/19/17 1651 12/19/17 2135 12/20/17 0619  GLUCAP 112* 203* 127*    PROBLEM LIST:    Principal Problem:   Right sided weakness Active Problems:   Hyperkalemia   Acute renal failure (HCC)   HTN (hypertension)   Diabetes mellitus, type 2 (HCC)   Hyperlipidemia associated with type 2 diabetes mellitus (HCC)   Seizures ([place])   UTI (urinary tract infection)   Cerebral embolism with cerebral infarction   CURRENT MEDS:   .  stroke: mapping our early stages of recovery book   Does not apply Once  . aspirin EC  81 mg Oral Daily  . finasteride  5 mg Oral Daily  . heparin  5,000 Units Subcutaneous Q8H  . Influenza vac split  quadrivalent PF  0.5 mL Intramuscular Tomorrow-1000  . insulin aspart  0-5 Units Subcutaneous QHS  . insulin aspart  0-9 Units Subcutaneous TID WC  . levETIRAcetam  500 mg Oral BID  . simvastatin  40 mg Oral Daily  . tamsulosin  0.4 mg Oral QPC supper    Deitra Mayo Beeper: [phone] Office: [phone] 12/20/2017

## 2017-12-21 ENCOUNTER — Encounter (HOSPITAL_COMMUNITY)
Admission: EM | Disposition: A | Discharge status: Rehabilitation Facility | Payer: Self-pay | Source: Home / Self Care | Attending: Internal Medicine

## 2017-12-21 ENCOUNTER — Inpatient Hospital Stay (HOSPITAL_COMMUNITY): Payer: Medicare HMO | Admitting: Certified Registered"

## 2017-12-21 HISTORY — PX: PATCH ANGIOPLASTY: SHX6230

## 2017-12-21 HISTORY — PX: ENDARTERECTOMY: SHX5162

## 2017-12-21 LAB — GLUCOSE, CAPILLARY
GLUCOSE-CAPILLARY: 140 mg/dL — AB (ref 70–99)
GLUCOSE-CAPILLARY: 139 mg/dL — AB (ref 70–99)
GLUCOSE-CAPILLARY: 221 mg/dL — AB (ref 70–99)
Glucose-Capillary: 200 mg/dL — ABNORMAL HIGH (ref 70–99)
Glucose-Capillary: 164 mg/dL — ABNORMAL HIGH (ref 70–99)

## 2017-12-21 LAB — POCT ACTIVATED CLOTTING TIME: Activated Clotting Time: 318 seconds

## 2017-12-21 SURGERY — ENDARTERECTOMY, CAROTID
Anesthesia: General | Site: Neck | Laterality: Left

## 2017-12-21 MED ORDER — DOCUSATE SODIUM 100 MG PO CAPS
100.0000 mg | ORAL_CAPSULE | Freq: Every day | ORAL | Status: DC
  Administered 2017-12-22 – 2017-12-30 (×6): 100 mg via ORAL
  Filled 2017-12-21 (×9): qty 1

## 2017-12-21 MED ORDER — EPHEDRINE 5 MG/ML INJ
INTRAVENOUS | Status: AC
  Filled 2017-12-21: qty 10

## 2017-12-21 MED ORDER — ROCURONIUM BROMIDE 10 MG/ML (PF) SYRINGE
PREFILLED_SYRINGE | INTRAVENOUS | Status: DC | PRN
  Administered 2017-12-21: 5 mg via INTRAVENOUS
  Administered 2017-12-21: 50 mg via INTRAVENOUS
  Administered 2017-12-21: 10 mg via INTRAVENOUS

## 2017-12-21 MED ORDER — PHENYLEPHRINE 40 MCG/ML (10ML) SYRINGE FOR IV PUSH (FOR BLOOD PRESSURE SUPPORT)
PREFILLED_SYRINGE | INTRAVENOUS | Status: AC
  Filled 2017-12-21: qty 10

## 2017-12-21 MED ORDER — HEPARIN SODIUM (PORCINE) 1000 UNIT/ML IJ SOLN
INTRAMUSCULAR | Status: DC | PRN
  Administered 2017-12-21: 10000 [IU] via INTRAVENOUS

## 2017-12-21 MED ORDER — HEPARIN SODIUM (PORCINE) 1000 UNIT/ML IJ SOLN
INTRAMUSCULAR | Status: AC
  Filled 2017-12-21: qty 1

## 2017-12-21 MED ORDER — SODIUM CHLORIDE 0.9 % IV SOLN
INTRAVENOUS | Status: DC | PRN
  Administered 2017-12-21: .5 ug/kg/min via INTRAVENOUS

## 2017-12-21 MED ORDER — CLOPIDOGREL BISULFATE 75 MG PO TABS
75.0000 mg | ORAL_TABLET | Freq: Every day | ORAL | Status: DC
  Administered 2017-12-21 – 2017-12-30 (×10): 75 mg via ORAL
  Filled 2017-12-21 (×10): qty 1

## 2017-12-21 MED ORDER — ONDANSETRON HCL 4 MG/2ML IJ SOLN: 4.0000 mg | Freq: Four times a day (QID) | INTRAMUSCULAR | Status: DC | PRN

## 2017-12-21 MED ORDER — HEPARIN SODIUM (PORCINE) 5000 UNIT/ML IJ SOLN
5000.0000 [IU] | Freq: Three times a day (TID) | INTRAMUSCULAR | Status: DC
  Administered 2017-12-21 – 2017-12-30 (×26): 5000 [IU] via SUBCUTANEOUS
  Filled 2017-12-21 (×26): qty 1

## 2017-12-21 MED ORDER — DOPAMINE-DEXTROSE 3.2-5 MG/ML-% IV SOLN
5.0000 ug/kg/min | INTRAVENOUS | Status: DC
  Administered 2017-12-21: 5 ug/kg/min via INTRAVENOUS

## 2017-12-21 MED ORDER — CEFAZOLIN SODIUM 1 G IJ SOLR
INTRAMUSCULAR | Status: AC
  Filled 2017-12-21: qty 20

## 2017-12-21 MED ORDER — PROTAMINE SULFATE 10 MG/ML IV SOLN
INTRAVENOUS | Status: AC
  Filled 2017-12-21: qty 5

## 2017-12-21 MED ORDER — EPHEDRINE SULFATE 50 MG/ML IJ SOLN: INTRAMUSCULAR | Status: DC | PRN

## 2017-12-21 MED ORDER — ESMOLOL HCL 100 MG/10ML IV SOLN
INTRAVENOUS | Status: AC
  Filled 2017-12-21: qty 10

## 2017-12-21 MED ORDER — PHENOL 1.4 % MT LIQD: 1.0000 | OROMUCOSAL | Status: DC | PRN

## 2017-12-21 MED ORDER — 0.9 % SODIUM CHLORIDE (POUR BTL) OPTIME
TOPICAL | Status: DC | PRN
  Administered 2017-12-21: 2000 mL

## 2017-12-21 MED ORDER — DEXTRAN 40 IN SALINE 10-0.9 % IV SOLN
INTRAVENOUS | Status: AC | PRN
  Administered 2017-12-21: 500 mL

## 2017-12-21 MED ORDER — ONDANSETRON HCL 4 MG/2ML IJ SOLN
INTRAMUSCULAR | Status: AC
  Filled 2017-12-21: qty 2

## 2017-12-21 MED ORDER — SODIUM CHLORIDE 0.9 % IV SOLN
INTRAVENOUS | Status: DC | PRN
  Administered 2017-12-21: 100 ug/min via INTRAVENOUS

## 2017-12-21 MED ORDER — CEFAZOLIN SODIUM-DEXTROSE 2-3 GM-%(50ML) IV SOLR
INTRAVENOUS | Status: DC | PRN
  Administered 2017-12-21: 2 g via INTRAVENOUS

## 2017-12-21 MED ORDER — LACTATED RINGERS IV SOLN
INTRAVENOUS | Status: DC | PRN
  Administered 2017-12-21: 07:00:00 via INTRAVENOUS

## 2017-12-21 MED ORDER — OXYCODONE HCL 5 MG/5ML PO SOLN: 5.0000 mg | Freq: Once | ORAL | Status: DC | PRN

## 2017-12-21 MED ORDER — DEXTRAN 40 IN D5W 10 % IV SOLN
INTRAVENOUS | Status: AC
  Filled 2017-12-21: qty 500

## 2017-12-21 MED ORDER — LACTATED RINGERS IV SOLN
INTRAVENOUS | Status: DC | PRN
  Administered 2017-12-21 (×2): via INTRAVENOUS

## 2017-12-21 MED ORDER — PHENYLEPHRINE 40 MCG/ML (10ML) SYRINGE FOR IV PUSH (FOR BLOOD PRESSURE SUPPORT)
PREFILLED_SYRINGE | INTRAVENOUS | Status: DC | PRN
  Administered 2017-12-21 (×2): 120 ug via INTRAVENOUS
  Administered 2017-12-21: 80 ug via INTRAVENOUS
  Administered 2017-12-21 (×2): 120 ug via INTRAVENOUS
  Administered 2017-12-21: 80 ug via INTRAVENOUS
  Administered 2017-12-21: 120 ug via INTRAVENOUS

## 2017-12-21 MED ORDER — MORPHINE SULFATE (PF) 2 MG/ML IV SOLN: 2.0000 mg | INTRAVENOUS | Status: DC | PRN

## 2017-12-21 MED ORDER — SODIUM CHLORIDE 0.9 % IV SOLN
INTRAVENOUS | Status: DC | PRN
  Administered 2017-12-21: 500 mL

## 2017-12-21 MED ORDER — OXYCODONE HCL 5 MG PO TABS: 5.0000 mg | ORAL_TABLET | Freq: Once | ORAL | Status: DC | PRN

## 2017-12-21 MED ORDER — ROCURONIUM BROMIDE 50 MG/5ML IV SOSY
PREFILLED_SYRINGE | INTRAVENOUS | Status: AC
  Filled 2017-12-21: qty 5

## 2017-12-21 MED ORDER — PROPOFOL 10 MG/ML IV BOLUS
INTRAVENOUS | Status: DC | PRN
  Administered 2017-12-21: 70 mg via INTRAVENOUS

## 2017-12-21 MED ORDER — LIDOCAINE-EPINEPHRINE (PF) 1 %-1:200000 IJ SOLN
INTRAMUSCULAR | Status: AC
  Filled 2017-12-21: qty 30

## 2017-12-21 MED ORDER — FENTANYL CITRATE (PF) 100 MCG/2ML IJ SOLN: 25.0000 ug | INTRAMUSCULAR | Status: DC | PRN

## 2017-12-21 MED ORDER — DOPAMINE-DEXTROSE 3.2-5 MG/ML-% IV SOLN
INTRAVENOUS | Status: AC
  Filled 2017-12-21: qty 250

## 2017-12-21 MED ORDER — HYDRALAZINE HCL 20 MG/ML IJ SOLN: 5.0000 mg | INTRAMUSCULAR | Status: DC | PRN

## 2017-12-21 MED ORDER — SUGAMMADEX SODIUM 500 MG/5ML IV SOLN
INTRAVENOUS | Status: AC
  Filled 2017-12-21: qty 5

## 2017-12-21 MED ORDER — PROTAMINE SULFATE 10 MG/ML IV SOLN
INTRAVENOUS | Status: DC | PRN
  Administered 2017-12-21: 40 mg via INTRAVENOUS

## 2017-12-21 MED ORDER — LABETALOL HCL 5 MG/ML IV SOLN: 10.0000 mg | INTRAVENOUS | Status: DC | PRN

## 2017-12-21 MED ORDER — METOPROLOL TARTRATE 5 MG/5ML IV SOLN: 2.0000 mg | INTRAVENOUS | Status: DC | PRN

## 2017-12-21 MED ORDER — GUAIFENESIN-DM 100-10 MG/5ML PO SYRP: 15.0000 mL | ORAL_SOLUTION | ORAL | Status: DC | PRN

## 2017-12-21 MED ORDER — LIDOCAINE 2% (20 MG/ML) 5 ML SYRINGE
INTRAMUSCULAR | Status: AC
  Filled 2017-12-21: qty 5

## 2017-12-21 MED ORDER — PANTOPRAZOLE SODIUM 40 MG PO TBEC
40.0000 mg | DELAYED_RELEASE_TABLET | Freq: Every day | ORAL | Status: DC
  Administered 2017-12-21 – 2017-12-30 (×10): 40 mg via ORAL
  Filled 2017-12-21 (×10): qty 1

## 2017-12-21 MED ORDER — FENTANYL CITRATE (PF) 250 MCG/5ML IJ SOLN
INTRAMUSCULAR | Status: DC | PRN
  Administered 2017-12-21 (×2): 25 ug via INTRAVENOUS

## 2017-12-21 MED ORDER — ONDANSETRON HCL 4 MG/2ML IJ SOLN
INTRAMUSCULAR | Status: DC | PRN
  Administered 2017-12-21: 4 mg via INTRAVENOUS

## 2017-12-21 MED ORDER — OXYCODONE-ACETAMINOPHEN 5-325 MG PO TABS: 1.0000 | ORAL_TABLET | ORAL | Status: DC | PRN

## 2017-12-21 MED ORDER — EPHEDRINE SULFATE-NACL 50-0.9 MG/10ML-% IV SOSY
PREFILLED_SYRINGE | INTRAVENOUS | Status: DC | PRN
  Administered 2017-12-21 (×13): 5 mg via INTRAVENOUS

## 2017-12-21 MED ORDER — DEXAMETHASONE SODIUM PHOSPHATE 10 MG/ML IJ SOLN
INTRAMUSCULAR | Status: DC | PRN
  Administered 2017-12-21: 4 mg via INTRAVENOUS

## 2017-12-21 MED ORDER — DEXAMETHASONE SODIUM PHOSPHATE 10 MG/ML IJ SOLN
INTRAMUSCULAR | Status: AC
  Filled 2017-12-21: qty 1

## 2017-12-21 MED ORDER — LIDOCAINE-EPINEPHRINE (PF) 1 %-1:200000 IJ SOLN
INTRAMUSCULAR | Status: DC | PRN
  Administered 2017-12-21: 10 mL

## 2017-12-21 MED ORDER — LIDOCAINE HCL (PF) 1 % IJ SOLN
INTRAMUSCULAR | Status: AC
  Filled 2017-12-21: qty 5

## 2017-12-21 MED ORDER — LIDOCAINE 2% (20 MG/ML) 5 ML SYRINGE
INTRAMUSCULAR | Status: DC | PRN
  Administered 2017-12-21: 60 mg via INTRAVENOUS

## 2017-12-21 MED ORDER — SUGAMMADEX SODIUM 200 MG/2ML IV SOLN
INTRAVENOUS | Status: DC | PRN
  Administered 2017-12-21: 400 mg via INTRAVENOUS

## 2017-12-21 MED ORDER — SODIUM CHLORIDE 0.9 % IV SOLN
INTRAVENOUS | Status: AC
  Filled 2017-12-21: qty 1.2

## 2017-12-21 MED ORDER — SODIUM CHLORIDE 0.9 % IV SOLN: INTRAVENOUS | Status: DC

## 2017-12-21 MED ORDER — SODIUM CHLORIDE 0.9 % IV SOLN
0.0500 ug/kg/min | INTRAVENOUS | Status: DC
  Filled 2017-12-21: qty 5000

## 2017-12-21 MED ORDER — FENTANYL CITRATE (PF) 250 MCG/5ML IJ SOLN
INTRAMUSCULAR | Status: AC
  Filled 2017-12-21: qty 5

## 2017-12-21 MED ORDER — PROPOFOL 10 MG/ML IV BOLUS
INTRAVENOUS | Status: AC
  Filled 2017-12-21: qty 20

## 2017-12-21 MED ORDER — SODIUM CHLORIDE 0.9 % IV SOLN: 500.0000 mL | Freq: Once | INTRAVENOUS | Status: DC | PRN

## 2017-12-21 SURGICAL SUPPLY — 51 items
ADH SKN CLS APL DERMABOND .7 (GAUZE/BANDAGES/DRESSINGS) ×1
BAG DECANTER FOR FLEXI CONT (MISCELLANEOUS) ×3 IMPLANT
CANISTER SUCT 3000ML PPV (MISCELLANEOUS) ×3 IMPLANT
CANNULA VESSEL 3MM 2 BLNT TIP (CANNULA) ×9 IMPLANT
CATH ROBINSON RED A/P 18FR (CATHETERS) ×3 IMPLANT
CLIP VESOCCLUDE MED 24/CT (CLIP) ×3 IMPLANT
CLIP VESOCCLUDE SM WIDE 24/CT (CLIP) ×3 IMPLANT
COVER WAND RF STERILE (DRAPES) ×3 IMPLANT
CRADLE DONUT ADULT HEAD (MISCELLANEOUS) ×3 IMPLANT
DERMABOND ADVANCED (GAUZE/BANDAGES/DRESSINGS) ×2
DERMABOND ADVANCED .7 DNX12 (GAUZE/BANDAGES/DRESSINGS) ×1 IMPLANT
DRAIN CHANNEL 15F RND FF W/TCR (WOUND CARE) IMPLANT
ELECT REM PT RETURN 9FT ADLT (ELECTROSURGICAL) ×3
ELECTRODE REM PT RTRN 9FT ADLT (ELECTROSURGICAL) ×1 IMPLANT
EVACUATOR SILICONE 100CC (DRAIN) IMPLANT
GLOVE BIO SURGEON STRL SZ 6 (GLOVE) ×6 IMPLANT
GLOVE BIO SURGEON STRL SZ7.5 (GLOVE) ×5 IMPLANT
GLOVE BIOGEL PI IND STRL 6.5 (GLOVE) IMPLANT
GLOVE BIOGEL PI IND STRL 7.0 (GLOVE) IMPLANT
GLOVE BIOGEL PI IND STRL 8 (GLOVE) ×1 IMPLANT
GLOVE BIOGEL PI INDICATOR 6.5 (GLOVE) ×4
GLOVE BIOGEL PI INDICATOR 7.0 (GLOVE) ×4
GLOVE BIOGEL PI INDICATOR 8 (GLOVE) ×2
GOWN STRL REUS W/ TWL LRG LVL3 (GOWN DISPOSABLE) ×3 IMPLANT
GOWN STRL REUS W/ TWL XL LVL3 (GOWN DISPOSABLE) IMPLANT
GOWN STRL REUS W/TWL LRG LVL3 (GOWN DISPOSABLE) ×12
GOWN STRL REUS W/TWL XL LVL3 (GOWN DISPOSABLE) ×3
KIT BASIN OR (CUSTOM PROCEDURE TRAY) ×3 IMPLANT
KIT SHUNT ARGYLE CAROTID ART 6 (VASCULAR PRODUCTS) IMPLANT
KIT TURNOVER KIT B (KITS) ×3 IMPLANT
NDL HYPO 25GX1X1/2 BEV (NEEDLE) ×1 IMPLANT
NEEDLE HYPO 25GX1X1/2 BEV (NEEDLE) ×3 IMPLANT
NS IRRIG 1000ML POUR BTL (IV SOLUTION) ×6 IMPLANT
PACK CAROTID (CUSTOM PROCEDURE TRAY) ×3 IMPLANT
PAD ARMBOARD 7.5X6 YLW CONV (MISCELLANEOUS) ×6 IMPLANT
PATCH VASC XENOSURE 1CMX6CM (Vascular Products) ×3 IMPLANT
PATCH VASC XENOSURE 1X6 (Vascular Products) IMPLANT
SHUNT CAROTID BYPASS 10 (VASCULAR PRODUCTS) ×2 IMPLANT
SHUNT CAROTID BYPASS 12 (VASCULAR PRODUCTS) ×2 IMPLANT
SPONGE SURGIFOAM ABS GEL 100 (HEMOSTASIS) IMPLANT
SUT PROLENE 6 0 BV (SUTURE) ×10 IMPLANT
SUT SILK 2 0 PERMA HAND 18 BK (SUTURE) IMPLANT
SUT SILK 3 0 (SUTURE) ×3
SUT SILK 3-0 18XBRD TIE 12 (SUTURE) IMPLANT
SUT VIC AB 3-0 SH 27 (SUTURE) ×3
SUT VIC AB 3-0 SH 27X BRD (SUTURE) ×1 IMPLANT
SUT VICRYL 4-0 PS2 18IN ABS (SUTURE) ×3 IMPLANT
SYR 20CC LL (SYRINGE) ×3 IMPLANT
SYR CONTROL 10ML LL (SYRINGE) ×3 IMPLANT
TOWEL GREEN STERILE (TOWEL DISPOSABLE) ×3 IMPLANT
WATER STERILE IRR 1000ML POUR (IV SOLUTION) ×3 IMPLANT

## 2017-12-21 NOTE — Transfer of Care (Signed)
Immediate Anesthesia Transfer of Care Note  Patient: [name] [name]  Procedure(s) Performed: Left ENDARTERECTOMY CAROTID (Left Neck) PATCH ANGIOPLASTY (Left Neck)  Patient Location: PACU  Anesthesia Type:General  Level of Consciousness: drowsy, patient cooperative and responds to stimulation  Airway & Oxygen Therapy: Patient Spontanous Breathing and Patient connected to face mask oxygen  Post-op Assessment: Report given to RN, Post -op Vital signs reviewed and unstable, Anesthesiologist notified, Patient moving all extremities and Patient moving all extremities X 4  Post vital signs: Reviewed and unstable - Hodierne MD informed of patient needing phenylephrine infusion, Dickson MD at bedside and aware of ABP trends.  Last Vitals:  Vitals Value Taken Time  BP 107/57 12/21/2017 10:14 AM  Temp    Pulse 83 12/21/2017 10:17 AM  Resp 13 12/21/2017 10:17 AM  SpO2 90 % 12/21/2017 10:17 AM  Vitals shown include unvalidated device data.  Last Pain:  Vitals:   12/21/17 0340  TempSrc: Oral  PainSc:       Patients Stated Pain Goal: 0 (12/87/86 7672)  Complications: No apparent anesthesia complications

## 2017-12-21 NOTE — Interval H&P Note (Signed)
History and Physical Interval Note:  12/21/2017 7:20 AM  [name] [name]  has presented today for surgery, with the diagnosis of carotid stenosis  The various methods of treatment have been discussed with the patient and family. After consideration of risks, benefits and other options for treatment, the patient has consented to  Procedure(s): ENDARTERECTOMY CAROTID (Left) as a surgical intervention .  The patient's history has been reviewed, patient examined, no change in status, stable for surgery.  I have reviewed the patient's chart and labs.  Questions were answered to the patient's satisfaction.     Deitra Mayo

## 2017-12-21 NOTE — Anesthesia Preprocedure Evaluation (Addendum)
Anesthesia Evaluation  Patient identified by MRN, date of birth, ID band Patient awake    Reviewed: Allergy & Precautions, H&P , NPO status , Patient's Chart, lab work & pertinent test results  Airway Mallampati: II   Neck ROM: full    Dental   Pulmonary neg pulmonary ROS,    breath sounds clear to auscultation       Cardiovascular hypertension, + Peripheral Vascular Disease   Rhythm:regular Rate:Normal     Neuro/Psych Seizures -,   Neuromuscular disease CVA    GI/Hepatic   Endo/Other  diabetes, Type 2obese  Renal/GU Renal InsufficiencyRenal diseasestones     Musculoskeletal  (+) Arthritis ,   Abdominal   Peds  Hematology   Anesthesia Other Findings   Reproductive/Obstetrics                             Anesthesia Physical Anesthesia Plan  ASA: III  Anesthesia Plan: General   Post-op Pain Management:    Induction: Intravenous  PONV Risk Score and Plan: 2 and Ondansetron, Dexamethasone and Treatment may vary due to age or medical condition  Airway Management Planned: Oral ETT  Additional Equipment: Arterial line  Intra-op Plan:   Post-operative Plan: Extubation in OR  Informed Consent: I have reviewed the patients History and Physical, chart, labs and discussed the procedure including the risks, benefits and alternatives for the proposed anesthesia with the patient or authorized representative who has indicated his/her understanding and acceptance.     Plan Discussed with: CRNA, Anesthesiologist and Surgeon  Anesthesia Plan Comments:         Anesthesia Quick Evaluation

## 2017-12-21 NOTE — NC FL2 (Signed)
[place] MEDICAID FL2 LEVEL OF CARE SCREENING TOOL     IDENTIFICATION  Patient Name: [name] [name] Birthdate: June 28, 1934 Sex: male Admission Date (Current Location): 12/18/2017  [facility] and [place] Number:  Herbalist and Address:  The . [facility], [place] [address], [place], [place] [address]      Provider Number: [id number]  Attending Physician Name and Address:  Caren Griffins, MD  Relative Name and Phone Number:  Miguel Rota, partner, [phone]    Current Level of Care: Hospital Recommended Level of Care: [place] Prior Approval Number:    Date Approved/Denied:   PASRR Number: [id number]  Discharge Plan: SNF    Current Diagnoses: Patient Active Problem List   Diagnosis Date Noted  . Cerebral embolism with cerebral infarction 12/20/2017  . CVA (cerebral vascular accident) ([place]) 12/20/2017  . Acute ischemic stroke ([place])   . History of CVA (cerebrovascular accident)   . Parkinson disease ([place])   . Diastolic dysfunction   . Hypokalemia   . Right sided weakness 12/18/2017  . Chronic venous insufficiency 05/05/2017  . .0... 12/21/2016  . Weakness 12/21/2016  . Contracture of muscle of right upper arm 12/21/2016  . UTI (urinary tract infection) 12/21/2016  . Seizures ([place])   . Left knee pain 09/06/2015  . Hyperlipidemia associated with type 2 diabetes mellitus ([place]) 09/06/2015  . Tremor 09/06/2015  . Urinary frequency 09/06/2015  . Acute urinary retention 08/12/2015  . Hyperkalemia 08/10/2015  . Acute renal failure ([place]) 08/10/2015  . Acute bilateral obstructive uropathy 08/10/2015  . HTN (hypertension) 08/10/2015  . Diabetes mellitus, type 2 ([place]) 08/10/2015    Orientation RESPIRATION BLADDER Height & Weight     Self, Time, Situation, Place  O2(nasal canula 3L) Continent Weight: 225 lb (102.1 kg) Height:  5\' 10"  (177.8 cm)  BEHAVIORAL SYMPTOMS/MOOD NEUROLOGICAL BOWEL NUTRITION STATUS      Continent  Diet(see discharge summary)  AMBULATORY STATUS COMMUNICATION OF NEEDS Skin   Extensive Assist Verbally Surgical wounds, Other (Comment)(surgical incision on left neck with liquid skin adhesive; rash on groin with barrier cream)                       Personal Care Assistance Level of Assistance  Bathing, Feeding, Dressing Bathing Assistance: Maximum assistance Feeding assistance: Independent Dressing Assistance: Maximum assistance     Functional Limitations Info  Sight, Hearing, Speech Sight Info: Impaired Hearing Info: Adequate Speech Info: Adequate    SPECIAL CARE FACTORS FREQUENCY  OT (By licensed OT), PT (By licensed PT)     PT Frequency: 5x week OT Frequency: 5x week            Contractures Contractures Info: Not present    Additional Factors Info  Code Status, Allergies, Psychotropic, Insulin Sliding Scale Code Status Info: Full Code Allergies Info: No Known Allergies Psychotropic Info: levETIRAcetam (KEPPRA) tablet 500 mg 2x daily PO Insulin Sliding Scale Info: insulin aspart (novoLOG) injection 0-5 Units daily at bedtime; insulin aspart (novoLOG) injection 0-9 Units 3x daily with meals       Current Medications (12/21/2017):  This is the current hospital active medication list Current Facility-Administered Medications  Medication Dose Route Frequency Provider Last Rate Last Dose  .  stroke: mapping our early stages of recovery book   Does not apply Once Elgergawy, Dawood S, MD      . 0.9 %  sodium chloride infusion   Intravenous Continuous Elgergawy, Silver Huguenin, MD 75 mL/hr  at 12/19/17 0606    . acetaminophen (TYLENOL) tablet 650 mg  650 mg Oral Q4H PRN Elgergawy, Silver Huguenin, MD   650 mg at 12/20/17 2211   Or  . acetaminophen (TYLENOL) solution 650 mg  650 mg Per Tube Q4H PRN Elgergawy, Silver Huguenin, MD       Or  . acetaminophen (TYLENOL) suppository 650 mg  650 mg Rectal Q4H PRN Elgergawy, Silver Huguenin, MD      . aspirin EC tablet 81 mg  81 mg Oral Daily Elgergawy,  Silver Huguenin, MD   81 mg at 12/20/17 0912  . cefTRIAXone (ROCEPHIN) 1 g in sodium chloride 0.9 % 100 mL IVPB  1 g Intravenous Q24H Elgergawy, Silver Huguenin, MD 200 mL/hr at 12/20/17 1156 1 g at 12/20/17 1156  . finasteride (PROSCAR) tablet 5 mg  5 mg Oral Daily Elgergawy, Silver Huguenin, MD   5 mg at 12/20/17 0912  . heparin injection 5,000 Units  5,000 Units Subcutaneous Q8H Elgergawy, Silver Huguenin, MD   5,000 Units at 12/20/17 2208  . Influenza vac split quadrivalent PF (FLUZONE HIGH-DOSE) injection 0.5 mL  0.5 mL Intramuscular Tomorrow-1000 Elgergawy, Silver Huguenin, MD      . insulin aspart (novoLOG) injection 0-5 Units  0-5 Units Subcutaneous QHS Elgergawy, Silver Huguenin, MD   2 Units at 12/19/17 2139  . insulin aspart (novoLOG) injection 0-9 Units  0-9 Units Subcutaneous TID WC Elgergawy, Silver Huguenin, MD   2 Units at 12/20/17 1806  . levETIRAcetam (KEPPRA) tablet 500 mg  500 mg Oral BID Elgergawy, Silver Huguenin, MD   500 mg at 12/20/17 2209  . senna-docusate (Senokot-S) tablet 1 tablet  1 tablet Oral QHS PRN Elgergawy, Silver Huguenin, MD      . simvastatin (ZOCOR) tablet 40 mg  40 mg Oral Daily Rinehuls, David L, PA-C   40 mg at 12/20/17 0912  . tamsulosin (FLOMAX) capsule 0.4 mg  0.4 mg Oral QPC supper Elgergawy, Silver Huguenin, MD   0.4 mg at 12/20/17 1805     Discharge Medications: Please see discharge summary for a list of discharge medications.  Relevant Imaging Results:  Relevant Lab Results:   Additional Information [id number] [place], [place]

## 2017-12-21 NOTE — Anesthesia Procedure Notes (Signed)
Arterial Line Insertion Start/End10/10/2017 7:00 AM, 12/21/2017 7:20 AM Performed by: Jearld Pies, CRNA, CRNA  Patient location: Pre-op. Preanesthetic checklist: patient identified, IV checked, site marked, risks and benefits discussed, surgical consent, monitors and equipment checked, pre-op evaluation, timeout performed and anesthesia consent Lidocaine 1% used for infiltration Right, radial was placed Catheter size: 20 G Hand hygiene performed , maximum sterile barriers used  and Seldinger technique used Allen's test indicative of satisfactory collateral circulation Attempts: 1 Procedure performed without using ultrasound guided technique. Following insertion, dressing applied and Biopatch. Post procedure assessment: normal  Patient tolerated the procedure well with no immediate complications.

## 2017-12-21 NOTE — Progress Notes (Signed)
PROGRESS NOTE  [name] [name] ZDG:[id number] DOB: 27-Mar-1934 DOA: 12/18/2017 PCP: Clovia Cuff, MD   LOS: 1 day   Brief Narrative / Interim history: 83 year old male with history of prior CVA, Parkinson's disease, recurrent UTIs, seizures, hypertension, chronic kidney disease stage III, diabetes mellitus who was admitted to the hospital on 10/5 with right-sided weakness when he woke up in the morning.  He also was weak in the right leg and also reported some tingling and numbness.  Neurology was consulted on admission. Neurology was consulted. He was found to have left ICA critical stenosis and is s/p endarterectomy 10/8  Subjective: -seen post op, no chest pain, no shortness of breath, denies abdominal pain, nausea or vomiting.   Assessment & Plan: Principal Problem:   Right sided weakness Active Problems:   Hyperkalemia   Acute renal failure (HCC)   HTN (hypertension)   Diabetes mellitus, type 2 (HCC)   Hyperlipidemia associated with type 2 diabetes mellitus (HCC)   Seizures (HCC)   UTI (urinary tract infection)   Cerebral embolism with cerebral infarction   CVA (cerebral vascular accident) ([place])   Acute ischemic stroke ([place])   History of CVA (cerebrovascular accident)   Parkinson disease ([place])   Diastolic dysfunction   Hypokalemia   Right-sided weakness/acute CVA -Neurology following, appreciate input, will complete work-up -MRI of the brain with multiple small infarcts in left MCA and left occipital lobe territories  -2D echo with normal EF, full results below -Hemoglobin A1c 7.2 which is acceptable for his age group, lipid panel shows an LDL of 73, he is on a statin  Left ICA critical stenosis -This was seen on the carotid Doppler, vascular surgery consulted, s/p left carotid endarterectomy with bovine pericardial patch angioplasty   Diabetes mellitus -CBGs 140s, continue current regimen   Hyperlipidemia -On statin  History of seizure disorder -Continue home  Keppra  Presumed UTI -cultures with E coli sst to ceftriaxone only, continue for total of 5 days, today day 4/5  Acute kidney injury chronic kidney disease stage III -1.7 on admission increased from baseline 1.3-1.5, Cr stable 1.5   Scheduled Meds: . [MAR Hold]  stroke: mapping our early stages of recovery book   Does not apply Once  . [MAR Hold] aspirin EC  81 mg Oral Daily  . [MAR Hold] finasteride  5 mg Oral Daily  . [MAR Hold] heparin  5,000 Units Subcutaneous Q8H  . [MAR Hold] Influenza vac split quadrivalent PF  0.5 mL Intramuscular Tomorrow-1000  . [MAR Hold] insulin aspart  0-5 Units Subcutaneous QHS  . [MAR Hold] insulin aspart  0-9 Units Subcutaneous TID WC  . [MAR Hold] levETIRAcetam  500 mg Oral BID  . [MAR Hold] simvastatin  40 mg Oral Daily  . [MAR Hold] tamsulosin  0.4 mg Oral QPC supper   Continuous Infusions: . sodium chloride 75 mL/hr at 12/19/17 0606  .  ceFAZolin (ANCEF) IV    . [MAR Hold] cefTRIAXone (ROCEPHIN)  IV 1 g (12/20/17 1156)  . DOPamine Stopped (12/21/17 1335)  . remifentanil (ULTIVA) 5000 mcg in 250 mL normal saline (20 mcg/mL) for ICU use     PRN Meds:.[MAR Hold] acetaminophen **OR** [MAR Hold] acetaminophen (TYLENOL) oral liquid 160 mg/5 mL **OR** [MAR Hold] acetaminophen, fentaNYL (SUBLIMAZE) injection, ondansetron (ZOFRAN) IV, oxyCODONE **OR** oxyCODONE, [MAR Hold] senna-docusate  DVT prophylaxis: Heparin Code Status: Full code Family Communication: No family present at bedside Disposition Plan: To be determined  Consultants:   Neurology  Procedures:   2D echo:  Study Conclusions  - Left ventricle: The cavity size was normal. There was moderate   concentric hypertrophy. Systolic function was normal. The   estimated ejection fraction was in the range of 50% to 55%. There   is akinesis of the inferoseptal myocardium. Doppler parameters   are consistent with abnormal left ventricular relaxation (grade 1   diastolic dysfunction).  Doppler parameters are consistent with   high ventricular filling pressure. - Aortic valve: Trileaflet; mildly thickened, mildly calcified   leaflets. There was trivial regurgitation. - Mitral valve: There was mild regurgitation. - Left atrium: The atrium was mildly dilated. - Right ventricle: The cavity size was mildly dilated. Wall   thickness was normal. - Tricuspid valve: There was trivial regurgitation. - Pulmonary arteries: Systolic pressure was mildly increased. PA   peak pressure: 39 mm Hg (S).  Antimicrobials:  Ceftriaxone 10/5 >>  Objective: Vitals:   12/21/17 1300 12/21/17 1315 12/21/17 1330 12/21/17 1415  BP: (!) 130/56     Pulse: 82  (!) 55 82  Resp: 19 16 20 20   Temp:      TempSrc:      SpO2: 97%  98% 99%  Weight:      Height:        Intake/Output Summary (Last 24 hours) at 12/21/2017 1430 Last data filed at 12/21/2017 1015 Gross per 24 hour  Intake 3253.77 ml  Output 1700 ml  Net 1553.77 ml   Filed Weights   12/18/17 1000  Weight: 102.1 kg    Examination:  Constitutional: NAD Eyes: no scleral icterus Neck: post op changes, incision clean ENMT: nnn Respiratory: CTA biL, no wheezing, moves air well  Cardiovascular: RRR, no murmurs heard, no edema  Abdomen: soft, nt, nd, bs+ Skin: no rashes seen  Neurologic: non focal, resting tremor present  Psychiatric: AxOx3   Data Reviewed: I have independently reviewed following labs and imaging studies   CBC: Recent Labs  Lab 12/18/17 1026  WBC 7.8  NEUTROABS 5.7  HGB 16.7  HCT 50.5  MCV 97.5  PLT 366   Basic Metabolic Panel: Recent Labs  Lab 12/18/17 1026 12/19/17 1233  NA 139 138  K 5.4* 3.4*  CL 104 104  CO2 24 21*  GLUCOSE 233* 179*  BUN 32* 23  CREATININE 1.72* 1.50*  CALCIUM 8.9 9.0   GFR: Estimated Creatinine Clearance: 47 mL/min (A) (by C-G formula based on SCr of 1.5 mg/dL (H)). Liver Function Tests: Recent Labs  Lab 12/18/17 1026  AST 31  ALT 14  ALKPHOS 88  BILITOT  1.5*  PROT 6.7  ALBUMIN 3.8   No results for input(s): LIPASE, AMYLASE in the last 168 hours. No results for input(s): AMMONIA in the last 168 hours. Coagulation Profile: Recent Labs  Lab 12/18/17 1026  INR 1.10   Cardiac Enzymes: No results for input(s): CKTOTAL, CKMB, CKMBINDEX, TROPONINI in the last 168 hours. BNP (last 3 results) No results for input(s): PROBNP in the last 8760 hours. HbA1C: Recent Labs    12/19/17 0740  HGBA1C 7.2*   CBG: Recent Labs  Lab 12/20/17 1114 12/20/17 1641 12/20/17 2140 12/21/17 0608 12/21/17 1015  GLUCAP 213* 158* 146* 140* 139*   Lipid Profile: Recent Labs    12/19/17 0740  CHOL 137  HDL 33*  LDLCALC 81  TRIG 116  CHOLHDL 4.2   Thyroid Function Tests: No results for input(s): TSH, T4TOTAL, FREET4, T3FREE, THYROIDAB in the last 72 hours. Anemia Panel: No results for input(s): VITAMINB12, FOLATE, FERRITIN, TIBC, IRON,  RETICCTPCT in the last 72 hours. Urine analysis:    Component Value Date/Time   COLORURINE YELLOW 12/18/2017 1211   APPEARANCEUR CLOUDY (A) 12/18/2017 1211   LABSPEC 1.015 12/18/2017 1211   PHURINE 5.0 12/18/2017 1211   GLUCOSEU NEGATIVE 12/18/2017 1211   HGBUR MODERATE (A) 12/18/2017 1211   BILIRUBINUR NEGATIVE 12/18/2017 1211   KETONESUR NEGATIVE 12/18/2017 1211   PROTEINUR NEGATIVE 12/18/2017 1211   UROBILINOGEN 0.2 03/14/2009 1129   NITRITE POSITIVE (A) 12/18/2017 1211   LEUKOCYTESUR LARGE (A) 12/18/2017 1211   Sepsis Labs: Invalid input(s): PROCALCITONIN, LACTICIDVEN  Recent Results (from the past 240 hour(s))  Urine culture     Status: Abnormal   Collection Time: 12/18/17  1:25 PM  Result Value Ref Range Status   Specimen Description URINE, RANDOM  Final   Special Requests   Final    NONE Performed at [facility], [address], [place], [place] [address]    Culture >=100,000 COLONIES/mL ESCHERICHIA COLI (A)  Final   Report Status 12/20/2017 FINAL  Final   Organism ID, Bacteria  ESCHERICHIA COLI (A)  Final      Susceptibility   Escherichia coli - MIC*    AMPICILLIN >=32 RESISTANT Resistant     CEFAZOLIN >=64 RESISTANT Resistant     CEFTRIAXONE <=1 SENSITIVE Sensitive     CIPROFLOXACIN >=4 RESISTANT Resistant     GENTAMICIN >=16 RESISTANT Resistant     IMIPENEM <=0.25 SENSITIVE Sensitive     NITROFURANTOIN 64 INTERMEDIATE Intermediate     TRIMETH/SULFA >=320 RESISTANT Resistant     AMPICILLIN/SULBACTAM >=32 RESISTANT Resistant     PIP/TAZO 64 INTERMEDIATE Intermediate     Extended ESBL NEGATIVE Sensitive     * >=100,000 COLONIES/mL ESCHERICHIA COLI  Surgical pcr screen     Status: None   Collection Time: 12/20/17  9:16 AM  Result Value Ref Range Status   MRSA, PCR NEGATIVE NEGATIVE Final   Staphylococcus aureus NEGATIVE NEGATIVE Final    Comment: (NOTE) The Xpert SA Assay (FDA approved for NASAL specimens in patients 69 years of age and older), is one component of a comprehensive surveillance program. It is not intended to diagnose infection nor to guide or monitor treatment. Performed at [facility], [place] [address], [place], [place] [address]       Radiology Studies: Mr Brain Wo Contrast  Result Date: 12/19/2017 CLINICAL DATA:  Right-sided weakness. EXAM: MRI HEAD WITHOUT CONTRAST MRA HEAD WITHOUT CONTRAST TECHNIQUE: Multiplanar, multiecho pulse sequences of the brain and surrounding structures were obtained without intravenous contrast. Angiographic images of the head were obtained using MRA technique without contrast. COMPARISON:  Head CT 12/18/2017 and MRI 12/22/2016 FINDINGS: MRI HEAD FINDINGS Brain: There are multiple small, acute, predominantly cortically based infarcts in the left cerebral hemisphere involving the parietal lobe, posterior frontal lobe, and occipital lobe. A 4 mm acute infarct is also noted in the right centrum semiovale. There is a small amount of associated petechial hemorrhage in the left parietal lobe. Elsewhere, patchy T2  hyperintensities in the cerebral white matter bilaterally have progressed from the prior MRI and are nonspecific but compatible with moderate chronic small vessel ischemic disease. The ventricles and sulci are within normal limits for age. Small chronic infarcts in the left cerebellum and left basal ganglia are unchanged from the prior MRI. No mass, midline shift, or extra-axial fluid collection is identified. Vascular: Major intracranial vascular flow voids are preserved. Skull and upper cervical spine: Unremarkable bone marrow signal. Sinuses/Orbits: Unremarkable  orbits. Trace right mastoid fluid. Clear paranasal sinuses. Other: Unchanged 25 x 5 mm right frontal scalp lipoma. MRA HEAD FINDINGS The study is mildly motion degraded. The visualized distal vertebral arteries are patent to the basilar with the left being dominant. PICA origins were not imaged. Normal SCA origins are visualized bilaterally. The basilar artery is patent without evidence of significant stenosis. There are fetal origins of both PCAs. There is a moderate mid left P2 stenosis. The internal carotid arteries are widely patent from skull base to carotid termini. ACAs and MCAs are patent without evidence of proximal branch occlusion. The left A1 segment appears diffusely hypoplastic with a possible superimposed moderate focal stenosis distally. There is left M1 segment irregularity without significant stenosis. No aneurysm is identified. IMPRESSION: 1. Multiple small acute infarcts in the left MCA territory and left occipital lobe. 2. Acute lacunar infarct in the right centrum semiovale. 3. Moderate chronic small vessel ischemic disease, progressed from 2018. 4. Patent circle of Willis without major branch occlusion. 5. Moderate left P2 and possible left A1 stenoses. 6. Fetal origin of both PCAs. Electronically Signed   By: Logan Bores M.D.   On: 12/19/2017 16:02   Mr Jodene Nam Head Wo Contrast  Result Date: 12/19/2017 CLINICAL DATA:  Right-sided  weakness. EXAM: MRI HEAD WITHOUT CONTRAST MRA HEAD WITHOUT CONTRAST TECHNIQUE: Multiplanar, multiecho pulse sequences of the brain and surrounding structures were obtained without intravenous contrast. Angiographic images of the head were obtained using MRA technique without contrast. COMPARISON:  Head CT 12/18/2017 and MRI 12/22/2016 FINDINGS: MRI HEAD FINDINGS Brain: There are multiple small, acute, predominantly cortically based infarcts in the left cerebral hemisphere involving the parietal lobe, posterior frontal lobe, and occipital lobe. A 4 mm acute infarct is also noted in the right centrum semiovale. There is a small amount of associated petechial hemorrhage in the left parietal lobe. Elsewhere, patchy T2 hyperintensities in the cerebral white matter bilaterally have progressed from the prior MRI and are nonspecific but compatible with moderate chronic small vessel ischemic disease. The ventricles and sulci are within normal limits for age. Small chronic infarcts in the left cerebellum and left basal ganglia are unchanged from the prior MRI. No mass, midline shift, or extra-axial fluid collection is identified. Vascular: Major intracranial vascular flow voids are preserved. Skull and upper cervical spine: Unremarkable bone marrow signal. Sinuses/Orbits: Unremarkable orbits. Trace right mastoid fluid. Clear paranasal sinuses. Other: Unchanged 25 x 5 mm right frontal scalp lipoma. MRA HEAD FINDINGS The study is mildly motion degraded. The visualized distal vertebral arteries are patent to the basilar with the left being dominant. PICA origins were not imaged. Normal SCA origins are visualized bilaterally. The basilar artery is patent without evidence of significant stenosis. There are fetal origins of both PCAs. There is a moderate mid left P2 stenosis. The internal carotid arteries are widely patent from skull base to carotid termini. ACAs and MCAs are patent without evidence of proximal branch occlusion.  The left A1 segment appears diffusely hypoplastic with a possible superimposed moderate focal stenosis distally. There is left M1 segment irregularity without significant stenosis. No aneurysm is identified. IMPRESSION: 1. Multiple small acute infarcts in the left MCA territory and left occipital lobe. 2. Acute lacunar infarct in the right centrum semiovale. 3. Moderate chronic small vessel ischemic disease, progressed from 2018. 4. Patent circle of Willis without major branch occlusion. 5. Moderate left P2 and possible left A1 stenoses. 6. Fetal origin of both PCAs. Electronically Signed   By: Zenia Resides  Jeralyn Ruths M.D.   On: 12/19/2017 16:02     Marzetta Board, MD, PhD [facility] Pager [phone]  If 7PM-7AM, please contact night-coverage [email or web address] Password [email or web address] 12/21/2017, 2:30 PM

## 2017-12-21 NOTE — Anesthesia Postprocedure Evaluation (Signed)
Anesthesia Post Note  Patient: [name] [name]  Procedure(s) Performed: Left ENDARTERECTOMY CAROTID (Left Neck) PATCH ANGIOPLASTY (Left Neck)     Patient location during evaluation: PACU Anesthesia Type: General Level of consciousness: awake and alert Pain management: pain level controlled Vital Signs Assessment: post-procedure vital signs reviewed and stable Respiratory status: spontaneous breathing, nonlabored ventilation, respiratory function stable and patient connected to nasal cannula oxygen Cardiovascular status: blood pressure returned to baseline and stable Postop Assessment: no apparent nausea or vomiting Anesthetic complications: no    Last Vitals:  Vitals:   12/21/17 1145 12/21/17 1200  BP: (!) 120/50 (!) 118/53  Pulse: 82 85  Resp: 20 15  Temp:    SpO2: 100% 97%    Last Pain:  Vitals:   12/21/17 1145  TempSrc:   PainSc: 0-No pain                 Errika Narvaiz S

## 2017-12-21 NOTE — Op Note (Signed)
    NAME: [name] [name]    MRN: [id number] DOB: [birth year]    DATE OF OPERATION: 12/21/2017  PREOP DIAGNOSIS:    Symptomatic left carotid stenosis  POSTOP DIAGNOSIS:    Same  PROCEDURE:    Left carotid endarterectomy with bovine pericardial patch angioplasty  SURGEON: Judeth Cornfield. Scot Dock, MD, FACS  ASSIST: Arlee Muslim, PA  ANESTHESIA: General  EBL: 100 cc  INDICATIONS:    [name] [name] is a 83 y.o. male who presented with a sudden onset of right upper extremity and right lower extremity weakness.  He was found to have a greater than 80% left carotid stenosis.  Left carotid endarterectomy was recommended in order to lower his risk of future stroke.  FINDINGS:   There was a 90% hemorrhagic plaque in the left carotid bifurcation extending fairly high up the internal carotid artery.  TECHNIQUE:   The patient was taken to the operating room and received a general anesthetic.  Arterial line had been placed by anesthesia.  The neck was prepped and draped in usual sterile fashion.  An incision was made along the anterior border of the sternocleidomastoid and the dissection carried down to the common carotid artery which was dissected free and controlled with Rummel tourniquet.  The patient was heparinized and ACT was obtained to demonstrate that the heparin was therapeutic.  The facial vein was divided between 2-0 silk ties.  The internal carotid artery was controlled above the plaque which extended fairly high.  The external carotid artery and superior thyroid arteries were controlled.  Next clamps were placed on the internal then the common and the external carotid artery.  A longitudinal arteriotomy was made through the common carotid artery and this was extended through the plaque into the internal carotid artery.  A 10 shunt was placed into the internal carotid artery, backbled and then placed into the common carotid artery and secured with Rummel tourniquet.  Flow was reestablished  to the shunt.  Flow was confirmed with the Doppler.  An endarterectomy plane was established proximally and the plaque was sharply divided.  Eversion endarterectomy was performed of the external carotid artery.  Distally the plaque tapered and 2 tacking sutures were placed.  The artery was irrigated with copious amounts of heparin and dextran and all loose debris removed.  The bovine pericardial patch was then sewn using continuous 6-0 Prolene suture.  Prior to completing patch closure the artery was irrigated and flushed after the shunt was removed.  Flow was reestablished first to the external carotid artery and into the internal carotid artery.  At the completion there was an excellent Doppler signal and pulse distal to the patch with good diastolic flow.  Hemostasis was obtained in the wound and the heparin was partially reversed with protamine.  The wound was closed the deep layer of 3-0 Vicryl, the platysma was closed with running 3-0 Vicryl and the skin closed with 4-0 Vicryl.  Dermabond was applied.  Patient tolerated the procedure well.  He was moving all extremities with some residual right-sided weakness which she had preoperatively at the completion of the procedure.  He was transferred to the recovery room in stable condition.  All needle and sponge counts were correct.  Deitra Mayo, MD, FACS [facility]  DATE OF DICTATION:   12/21/2017

## 2017-12-21 NOTE — Social Work (Signed)
CSW acknowledging pt desire to go home as expressed to vascular MD- upon last assessment therapies still recommending SNF. Will complete FL2 for pt and await additional therapy recommendations.  Alexander Mt, [place] Work [phone]

## 2017-12-21 NOTE — Progress Notes (Signed)
   VASCULAR SURGERY POSTOP:   Stable postop.  There was some talk about him him going to skilled nursing facility on discharge but he wants to go home.  We will have to see how he does with physical therapy.  He is to be on aspirin and Plavix for 3 weeks as per neuro.  After that, neuro wants him on Plavix only.  He is on a statin.  SUBJECTIVE:   No complaints.  PHYSICAL EXAM:   Vitals:   12/21/17 1315 12/21/17 1330 12/21/17 1415 12/21/17 1430  BP:      Pulse:  (!) 55 82 84  Resp: 16 20 20 20   Temp:      TempSrc:      SpO2:  98% 99% 95%  Weight:      Height:       Neck incision looks fine. NEURO: Mild right sided weakness same as preop.  LABS:   Lab Results  Component Value Date   WBC 7.8 12/18/2017   HGB 16.7 12/18/2017   HCT 50.5 12/18/2017   MCV 97.5 12/18/2017   PLT 163 12/18/2017   Lab Results  Component Value Date   CREATININE 1.50 (H) 12/19/2017   Lab Results  Component Value Date   INR 1.10 12/18/2017   CBG (last 3)  Recent Labs    12/20/17 2140 12/21/17 0608 12/21/17 1015  GLUCAP 146* 140* 139*    PROBLEM LIST:    Principal Problem:   Right sided weakness Active Problems:   Hyperkalemia   Acute renal failure (HCC)   HTN (hypertension)   Diabetes mellitus, type 2 (HCC)   Hyperlipidemia associated with type 2 diabetes mellitus (HCC)   Seizures (HCC)   UTI (urinary tract infection)   Cerebral embolism with cerebral infarction   CVA (cerebral vascular accident) ([place])   Acute ischemic stroke ([place])   History of CVA (cerebrovascular accident)   Parkinson disease ([place])   Diastolic dysfunction   Hypokalemia   CURRENT MEDS:   .  stroke: mapping our early stages of recovery book   Does not apply Once  . aspirin EC  81 mg Oral Daily  . finasteride  5 mg Oral Daily  . heparin  5,000 Units Subcutaneous Q8H  . Influenza vac split quadrivalent PF  0.5 mL Intramuscular Tomorrow-1000  . insulin aspart  0-5 Units Subcutaneous QHS  . insulin  aspart  0-9 Units Subcutaneous TID WC  . levETIRAcetam  500 mg Oral BID  . simvastatin  40 mg Oral Daily  . tamsulosin  0.4 mg Oral QPC supper    Deitra Mayo Beeper: [phone] Office: [phone] 12/21/2017

## 2017-12-21 NOTE — Progress Notes (Signed)
Pt received from PACU. A line in place. Zeroed with good wave form. Pt has weakness to right upper and lower extremity. Face symmetric. Pt states weakness has improved after surgery. CCMD notified and telebox applied. Vitals stable. CHG bath given. Will continue to monitor. [name] [name]

## 2017-12-21 NOTE — Anesthesia Procedure Notes (Signed)
Procedure Name: Intubation Date/Time: 12/21/2017 7:42 AM Performed by: Jearld Pies, CRNA Pre-anesthesia Checklist: Patient identified, Emergency Drugs available, Suction available and Patient being monitored Patient Re-evaluated:Patient Re-evaluated prior to induction Oxygen Delivery Method: Circle System Utilized Preoxygenation: Pre-oxygenation with 100% oxygen Induction Type: IV induction Ventilation: Two handed mask ventilation required and Oral airway inserted - appropriate to patient size Laryngoscope Size: Glidescope and 4 Grade View: Grade I Tube type: Oral Tube size: 7.5 mm Number of attempts: 1 Airway Equipment and Method: Stylet and Oral airway Placement Confirmation: ETT inserted through vocal cords under direct vision,  positive ETCO2 and breath sounds checked- equal and bilateral Secured at: 22 cm Tube secured with: Tape Dental Injury: Teeth and Oropharynx as per pre-operative assessment

## 2017-12-22 ENCOUNTER — Encounter (HOSPITAL_COMMUNITY): Payer: Self-pay | Admitting: Vascular Surgery

## 2017-12-22 ENCOUNTER — Telehealth: Payer: Self-pay | Admitting: Vascular Surgery

## 2017-12-22 LAB — CBC
HCT: 41.5 % (ref 39.0–52.0)
Hemoglobin: 13.2 g/dL (ref 13.0–17.0)
MCH: 30.8 pg (ref 26.0–34.0)
MCHC: 31.8 g/dL (ref 30.0–36.0)
MCV: 96.7 fL (ref 80.0–100.0)
Platelets: 171 10*3/uL (ref 150–400)
RBC: 4.29 MIL/uL (ref 4.22–5.81)
RDW: 12.8 % (ref 11.5–15.5)
WBC: 9.9 10*3/uL (ref 4.0–10.5)
nRBC: 0 % (ref 0.0–0.2)

## 2017-12-22 LAB — GLUCOSE, CAPILLARY
GLUCOSE-CAPILLARY: 185 mg/dL — AB (ref 70–99)
GLUCOSE-CAPILLARY: 200 mg/dL — AB (ref 70–99)
Glucose-Capillary: 119 mg/dL — ABNORMAL HIGH (ref 70–99)
Glucose-Capillary: 150 mg/dL — ABNORMAL HIGH (ref 70–99)

## 2017-12-22 LAB — BASIC METABOLIC PANEL
Anion gap: 6 (ref 5–15)
BUN: 27 mg/dL — ABNORMAL HIGH (ref 8–23)
CALCIUM: 8.2 mg/dL — AB (ref 8.9–10.3)
CO2: 25 mmol/L (ref 22–32)
CREATININE: 1.44 mg/dL — AB (ref 0.61–1.24)
Chloride: 109 mmol/L (ref 98–111)
GFR calc Af Amer: 51 mL/min — ABNORMAL LOW (ref >60)
GFR, EST NON AFRICAN AMERICAN: 44 mL/min — AB (ref >60)
GLUCOSE: 148 mg/dL — AB (ref 70–99)
Potassium: 3.7 mmol/L (ref 3.5–5.1)
Sodium: 140 mmol/L (ref 135–145)

## 2017-12-22 NOTE — Progress Notes (Signed)
PROGRESS NOTE  [name] [name] QPR:[id number] DOB: [birth year] DOA: 12/18/2017 PCP: Clovia Cuff, MD   LOS: 2 days   Brief Narrative / Interim history: 83 year old male with history of prior CVA, Parkinson's disease, recurrent UTIs, seizures, hypertension, chronic kidney disease stage III, diabetes mellitus who was admitted to the hospital on 10/5 with right-sided weakness when he woke up in the morning.  He also was weak in the right leg and also reported some tingling and numbness.  Neurology was consulted on admission. Neurology was consulted. He was found to have left ICA critical stenosis and is s/p endarterectomy 10/8  Subjective: No major events overnight.  He asks me if he could go home with a physical therapy as his wife is blind and need his help.  Denies chest pain, shortness of breath, abdominal pain or further neuro deficit.  Assessment & Plan: Principal Problem:   Right sided weakness Active Problems:   Hyperkalemia   Acute renal failure (HCC)   HTN (hypertension)   Diabetes mellitus, type 2 (HCC)   Hyperlipidemia associated with type 2 diabetes mellitus (HCC)   Seizures (HCC)   UTI (urinary tract infection)   Cerebral embolism with cerebral infarction   CVA (cerebral vascular accident) ([place])   Acute ischemic stroke ([place])   History of CVA (cerebrovascular accident)   Parkinson disease ([place])   Diastolic dysfunction   Hypokalemia  Right-sided weakness/acute CVA: MRI of the brain with multiple small infarcts in left MCA and left occipital lobe territories. 2D echo with normal EF, full results below -Neurology signed off.  Appreciate input.  Recommended event monitor for 30 days -Hemoglobin A1c 7.2 which is acceptable for his age group, lipid panel shows an LDL of 81, he is on a statin -Continue aspirin, Plavix and statin. -IV normal saline running at 75 cc/h.  BP on low side of normal -Permissive hypertension.  Normalize over 5 to 7 days per neuro.  Left ICA critical  stenosis: s/p left carotid endarterectomy with bovine pericardial patch angioplasty.  Surgical wound clean -Vascular surgery following  Diabetes mellitus -CBG ACHS -Change to moderate sliding scale  Hyperlipidemia -On statin  History of seizure disorder -Continue home Keppra  Presumed UTI -cultures with E coli sst to ceftriaxone.  Day 5/5.  Acute kidney injury chronic kidney disease stage III: Improved. -1.7 on admission increased from baseline 1.3-1.5, Cr stable 1.5 -IV fluid as above.  History of seizure: Stable. -Continue Keppra  Scheduled Meds: .  stroke: mapping our early stages of recovery book   Does not apply Once  . aspirin EC  81 mg Oral Daily  . clopidogrel  75 mg Oral Daily  . docusate sodium  100 mg Oral Daily  . finasteride  5 mg Oral Daily  . heparin  5,000 Units Subcutaneous Q8H  . Influenza vac split quadrivalent PF  0.5 mL Intramuscular Tomorrow-1000  . insulin aspart  0-5 Units Subcutaneous QHS  . insulin aspart  0-9 Units Subcutaneous TID WC  . levETIRAcetam  500 mg Oral BID  . pantoprazole  40 mg Oral Daily  . simvastatin  40 mg Oral Daily  . tamsulosin  0.4 mg Oral QPC supper   Continuous Infusions: . sodium chloride Stopped (12/21/17 0602)  . sodium chloride    . sodium chloride    . cefTRIAXone (ROCEPHIN)  IV 1 g (12/20/17 1156)   PRN Meds:.sodium chloride, acetaminophen **OR** acetaminophen (TYLENOL) oral liquid 160 mg/5 mL **OR** acetaminophen, guaiFENesin-dextromethorphan, hydrALAZINE, labetalol, metoprolol tartrate, morphine injection, ondansetron, oxyCODONE-acetaminophen,  phenol, senna-docusate  DVT prophylaxis: Heparin Code Status: Full code Family Communication: No Family about bedside. Disposition Plan: Transition to telemetry.  SNF when bed available.  Medically ready.  Discussed with neurology (Dr. Erlinda Hong) about patient's desire to go home. Dr. Erlinda Hong didn't think that is safe. Patient agreeable to discharge to SNF when I told him about  our discussion with neuro.  Consultants:   Neurology signed off  Vascular surgery  Procedures:   Left CEA on 10/8  Antimicrobials: -Ceftriaxone Objective: Vitals:   12/21/17 2354 12/22/17 0432 12/22/17 0751 12/22/17 1200  BP: (!) 112/50 (!) 117/59 124/69 (!) 112/55  Pulse: (!) 51 (!) 52 60 64  Resp: 18 18 18    Temp: (!) 97.5 F (36.4 C) 98.9 F (37.2 C) 98.1 F (36.7 C) 98.3 F (36.8 C)  TempSrc: Oral Oral Oral Oral  SpO2: 93% 92% 93% 90%  Weight:      Height:        Intake/Output Summary (Last 24 hours) at 12/22/2017 1337 Last data filed at 12/22/2017 0500 Gross per 24 hour  Intake 1178.45 ml  Output 575 ml  Net 603.45 ml   Filed Weights   12/18/17 1000  Weight: 102.1 kg    Examination:  GENERAL: appears well, no ditress HEENT: PERRLA, MMM NECK: Surgical wound in left neck dry and clean. LUNGS:  No IWOB, good air movement, CTAB HEART:  RRR with no M/R/G ABD:  Morbidly obese, soft, NT with active BS EXT: No edema. NEURO:  awake, alert and oriented appropriately.  Cranial nerve is intact.  Motor 3/5 in RUE, 5/5 in LUE and 4/5 in both lower extremities. PSYCH: calm. Normal affect   Data Reviewed: I have independently reviewed following labs and imaging studies.  CBC: Recent Labs  Lab 12/18/17 1026 12/22/17 0432  WBC 7.8 9.9  NEUTROABS 5.7  --   HGB 16.7 13.2  HCT 50.5 41.5  MCV 97.5 96.7  PLT 163 242   Basic Metabolic Panel: Recent Labs  Lab 12/18/17 1026 12/19/17 1233 12/22/17 0432  NA 139 138 140  K 5.4* 3.4* 3.7  CL 104 104 109  CO2 24 21* 25  GLUCOSE 233* 179* 148*  BUN 32* 23 27*  CREATININE 1.72* 1.50* 1.44*  CALCIUM 8.9 9.0 8.2*   GFR: Estimated Creatinine Clearance: 49 mL/min (A) (by C-G formula based on SCr of 1.44 mg/dL (H)). Liver Function Tests: Recent Labs  Lab 12/18/17 1026  AST 31  ALT 14  ALKPHOS 88  BILITOT 1.5*  PROT 6.7  ALBUMIN 3.8   No results for input(s): LIPASE, AMYLASE in the last 168 hours. No  results for input(s): AMMONIA in the last 168 hours. Coagulation Profile: Recent Labs  Lab 12/18/17 1026  INR 1.10   Cardiac Enzymes: No results for input(s): CKTOTAL, CKMB, CKMBINDEX, TROPONINI in the last 168 hours. BNP (last 3 results) No results for input(s): PROBNP in the last 8760 hours. HbA1C: No results for input(s): HGBA1C in the last 72 hours. CBG: Recent Labs  Lab 12/21/17 1556 12/21/17 1706 12/21/17 2137 12/22/17 0631 12/22/17 1253  GLUCAP 200* 221* 164* 119* 185*   Lipid Profile: No results for input(s): CHOL, HDL, LDLCALC, TRIG, CHOLHDL, LDLDIRECT in the last 72 hours. Thyroid Function Tests: No results for input(s): TSH, T4TOTAL, FREET4, T3FREE, THYROIDAB in the last 72 hours. Anemia Panel: No results for input(s): VITAMINB12, FOLATE, FERRITIN, TIBC, IRON, RETICCTPCT in the last 72 hours. Urine analysis:    Component Value Date/Time   COLORURINE YELLOW  12/18/2017 1211   APPEARANCEUR CLOUDY (A) 12/18/2017 1211   LABSPEC 1.015 12/18/2017 1211   PHURINE 5.0 12/18/2017 1211   GLUCOSEU NEGATIVE 12/18/2017 1211   HGBUR MODERATE (A) 12/18/2017 1211   BILIRUBINUR NEGATIVE 12/18/2017 1211   KETONESUR NEGATIVE 12/18/2017 1211   PROTEINUR NEGATIVE 12/18/2017 1211   UROBILINOGEN 0.2 03/14/2009 1129   NITRITE POSITIVE (A) 12/18/2017 1211   LEUKOCYTESUR LARGE (A) 12/18/2017 1211   Sepsis Labs: Invalid input(s): PROCALCITONIN, LACTICIDVEN  Recent Results (from the past 240 hour(s))  Urine culture     Status: Abnormal   Collection Time: 12/18/17  1:25 PM  Result Value Ref Range Status   Specimen Description URINE, RANDOM  Final   Special Requests   Final    NONE Performed at [facility], [address], [place], [place] [address]    Culture >=100,000 COLONIES/mL ESCHERICHIA COLI (A)  Final   Report Status 12/20/2017 FINAL  Final   Organism ID, Bacteria ESCHERICHIA COLI (A)  Final      Susceptibility   Escherichia coli - MIC*    AMPICILLIN >=32  RESISTANT Resistant     CEFAZOLIN >=64 RESISTANT Resistant     CEFTRIAXONE <=1 SENSITIVE Sensitive     CIPROFLOXACIN >=4 RESISTANT Resistant     GENTAMICIN >=16 RESISTANT Resistant     IMIPENEM <=0.25 SENSITIVE Sensitive     NITROFURANTOIN 64 INTERMEDIATE Intermediate     TRIMETH/SULFA >=320 RESISTANT Resistant     AMPICILLIN/SULBACTAM >=32 RESISTANT Resistant     PIP/TAZO 64 INTERMEDIATE Intermediate     Extended ESBL NEGATIVE Sensitive     * >=100,000 COLONIES/mL ESCHERICHIA COLI  Surgical pcr screen     Status: None   Collection Time: 12/20/17  9:16 AM  Result Value Ref Range Status   MRSA, PCR NEGATIVE NEGATIVE Final   Staphylococcus aureus NEGATIVE NEGATIVE Final    Comment: (NOTE) The Xpert SA Assay (FDA approved for NASAL specimens in patients 71 years of age and older), is one component of a comprehensive surveillance program. It is not intended to diagnose infection nor to guide or monitor treatment. Performed at [facility], [place] [address], [place], [place] [address]       Radiology Studies: No results found.  [facility] Pager [phone]  If 7PM-7AM, please contact night-coverage [email or web address] Password [email or web address] 12/22/2017, 1:37 PM

## 2017-12-22 NOTE — Clinical Social Work Note (Addendum)
Clinical Social Work Assessment  Patient Details  Name: [name] [name] MRN: [id number] Date of Birth: 1934/11/29  Date of referral:  12/22/17               Reason for consult:  Facility Placement, Discharge Planning                Permission sought to share information with:    Permission granted to share information::     Name::        Agency::     Relationship::     Contact Information:     Housing/Transportation Living arrangements for the past 2 months:  Single Family Home Source of Information:  Patient Patient Interpreter Needed:  None Criminal Activity/Legal Involvement Pertinent to Current Situation/Hospitalization:  No - Comment as needed Significant Relationships:  Spouse, Adult Children, Other Family Members Lives with:  Spouse Do you feel safe going back to the place where you live?  Yes Need for family participation in patient care:  Yes (Comment)  Care giving concerns: Patient from home with spouse. PT recommending SNF.   Social Worker assessment / plan: CSW met with patient at bedside. Patient alert and oriented. CSW introduced self and role and discussed disposition planning. Patient refused SNF, stating he wants to get home to his wife, who is blind. Patient indicated that prior to admission, he was independent with mobility and was driving. Patient wants to go home with home health services from [place]. Patient reported that he and his wife have groceries delivered to their home. Patient stated he knows he is not at baseline, but hopeful with home health PT, he can recover enough to be able to manage well at home.   Patient did also disclose legal issues that would be a barrier to placement and CSW did confirm. Patient cannot be placed in SNF due to these legal concerns.  CSW referred patient to [facility] for home health needs. CSW will sign off, as no additional needs identified at this time.  Employment status:  Retired Forensic scientist:  Managed  Medicare([employer]) PT Recommendations:  [place] / Referral to community resources:  [place]  Patient/Family's Response to care: Patient appreciative of care.  Patient/Family's Understanding of and Emotional Response to Diagnosis, Current Treatment, and Prognosis: Patient with understanding of his conditions and refusing SNF, would like to go home with home health.  Emotional Assessment Appearance:  Appears stated age Attitude/Demeanor/Rapport:  Engaged Affect (typically observed):  Accepting, Calm, Pleasant, Appropriate Orientation:  Oriented to Place, Oriented to Self, Oriented to  Time, Oriented to Situation Alcohol / Substance use:  Not Applicable Psych involvement (Current and /or in the community):  No (Comment)  Discharge Needs  Concerns to be addressed:  Discharge Planning Concerns, Care Coordination Readmission within the last 30 days:  No Current discharge risk:  Physical Impairment Barriers to Discharge:  Continued Medical Work up   Estanislado Emms, LCSW 12/22/2017, 11:23 AM

## 2017-12-22 NOTE — Telephone Encounter (Signed)
sch appt spk to pt wife 01/19/18 1230pm p/o MD

## 2017-12-22 NOTE — Progress Notes (Addendum)
Progress Note  12/22/2017 7:22 AM 1 Day Post-Op  Subjective:  Wants to go home; says weakness in his right arm is about the same.   Afebrile HR 50's-70's 865'H-846'N systolic  62% RA   Vitals:   12/21/17 2354 12/22/17 0432  BP: (!) 112/50 (!) 117/59  Pulse: (!) 51 (!) 52  Resp: 18 18  Temp: (!) 97.5 F (36.4 C) 98.9 F (37.2 C)  SpO2: 93% 92%    Physical Exam: Neuro:  Right arm weakness unchanged Lungs:  Non labored Incision:  Clean and dry  CBC    Component Value Date/Time   WBC 9.9 12/22/2017 0432   RBC 4.29 12/22/2017 0432   HGB 13.2 12/22/2017 0432   HCT 41.5 12/22/2017 0432   PLT 171 12/22/2017 0432   MCV 96.7 12/22/2017 0432   MCH 30.8 12/22/2017 0432   MCHC 31.8 12/22/2017 0432   RDW 12.8 12/22/2017 0432   LYMPHSABS 1.2 12/18/2017 1026   MONOABS 0.5 12/18/2017 1026   EOSABS 0.3 12/18/2017 1026   BASOSABS 0.1 12/18/2017 1026    BMET    Component Value Date/Time   NA 140 12/22/2017 0432   NA 142 09/06/2015 1003   K 3.7 12/22/2017 0432   CL 109 12/22/2017 0432   CO2 25 12/22/2017 0432   GLUCOSE 148 (H) 12/22/2017 0432   BUN 27 (H) 12/22/2017 0432   BUN 25 09/06/2015 1003   CREATININE 1.44 (H) 12/22/2017 0432   CALCIUM 8.2 (L) 12/22/2017 0432   GFRNONAA 44 (L) 12/22/2017 0432   GFRAA 51 (L) 12/22/2017 0432     Intake/Output Summary (Last 24 hours) at 12/22/2017 0722 Last data filed at 12/22/2017 0500 Gross per 24 hour  Intake 3078.45 ml  Output 675 ml  Net 2403.45 ml     Assessment/Plan:  This is a 83 y.o. male who is s/p left CEA 1 Day Post-Op  -pt is doing well this am. -pt still with right arm weakness -pt has not ambulated-needs to get out of bed. -pt still has foley -f/u with Dr. Scot Dock in 2 weeks.  Pt wants to go home instead of SNF.  Says he has [place] and his daughter in law can help.  Will defer to primary service once PT has worked with pt.   He is to be on aspirin and Plavix for 3 weeks as per neuro.  After that, neuro wants  him on Plavix only.   Leontine Locket, PA-C [facility] [phone]  --- For [facility] use --- Instructions: Press F2 to tab through selections.  Delete question if not applicable.   Modified Rankin score at D/C (0-6): Rankin Score=1  IV medication needed for:  1. Hypertension: No 2. Hypotension: No  Post-op Complications: No  1. Post-op CVA or TIA: No  If yes: Event classification (right eye, left eye, right cortical, left cortical, verterobasilar, other): n/a  If yes: Timing of event (intra-op, <6 hrs post-op, >=6 hrs post-op, unknown): n/a   2. CN injury: No  If yes: CN n/a injuried   3. Myocardial infarction: No  If yes: Dx by (EKG or clinical, Troponin): n/a  4.  CHF: No  5.  Dysrhythmia (new): No  6. Wound infection: No  7. Reperfusion symptoms: No  8. Return to OR: No  If yes: return to OR for (bleeding, neurologic, other CEA incision, other): n/a  Discharge medications: Statin use:  Yes ASA use:  Yes Beta blocker use:  No ACE-Inhibitor use:  No P2Y12  Antagonist use: [ ]  None, [x ] Plavix, [ ]  Plasugrel, [ ]  Ticlopinine, [ ]  Ticagrelor, [ ]  Other, [ ]  No for medical reason, [ ]  Non-compliant, [ ]  Not-indicated Anti-coagulant use:  [ x] None, [ ]  Warfarin, [ ]  Rivaroxaban, [ ]  Dabigatran, [ ]  Other, [ ]  No for medical reason, [ ]  Non-compliant, [ ]  Not-indicated  I have interviewed the patient and examined the patient. I agree with the findings by the PA. Neuro is at baseline. His incision looks fine.  Gae Gallop, MD [phone] Marland Kitchen

## 2017-12-22 NOTE — Progress Notes (Signed)
Physical Therapy Treatment Patient Details Name: [name] [name] MRN: [id number] DOB: 04/02/1934 Today's Date: 12/22/2017    History of Present Illness Pt is a 83 y.o. male, past medical history of recurrent UTIs, seizures, CVA, Parkinson disease, hypertension, CKD stage III, Beatties mellitus, presents with complaints of right-sided weakness, numbness and tingling, and headache. Carotid doppler positive for signifincat left ICA critical stensosis 80-99%, plan for L carotid endarterectomy Tuesday, CT negative, MRI scheduled.     PT Comments    Patient progressing some with mobility this session, remains +2 A due to leaning to R in standing with decreased awareness and R LE buckling, fixes with cues and facilitation to allow stand step to recliner with RW.  Feel he will need STSNF level rehab upon d/c.  PT to follow acutely.   Follow Up Recommendations  SNF     Equipment Recommendations  Other (comment)(TBA)    Recommendations for Other Services       Precautions / Restrictions Precautions Precautions: Fall    Mobility  Bed Mobility Overal bed mobility: Needs Assistance Bed Mobility: Rolling;Sidelying to Sit Rolling: Min assist Sidelying to sit: Mod assist;HOB elevated;+2 for safety/equipment       General bed mobility comments: cues for technique, assist for legs off bed and to lift trunk and reach for footboard for balance  Transfers Overall transfer level: Needs assistance Equipment used: Rolling walker (2 wheeled) Transfers: Sit to/from Stand Sit to Stand: Mod assist;+2 physical assistance         General transfer comment: assist for balance, L side extension and cues for forward gaze; stand step to chair with mod A of 2 max cues for L weight shift, R foot progression and hip and knee extension in stance on R LE with facilitation  Ambulation/Gait                 Stairs             Wheelchair Mobility    Modified Rankin (Stroke Patients  Only) Modified Rankin (Stroke Patients Only) Pre-Morbid Rankin Score: No significant disability Modified Rankin: Moderately severe disability     Balance Overall balance assessment: Needs assistance Sitting-balance support: Feet supported;Single extremity supported Sitting balance-Leahy Scale: Poor Sitting balance - Comments: UE support initially for balance, then able to sit with S and move head side to side and up/down without LOB   Standing balance support: Bilateral upper extremity supported Standing balance-Leahy Scale: Poor Standing balance comment: sinking on R knee in standing but fixes with cues and assist with bilateral UE support                            Cognition Arousal/Alertness: Awake/alert Behavior During Therapy: WFL for tasks assessed/performed Overall Cognitive Status: Impaired/Different from baseline Area of Impairment: Safety/judgement;Problem solving;Following commands;Attention;Awareness                   Current Attention Level: Selective Memory: Decreased short-term memory Following Commands: Follows one step commands with increased time Safety/Judgement: Decreased awareness of safety;Decreased awareness of deficits   Problem Solving: Slow processing;Decreased initiation;Requires verbal cues;Requires tactile cues        Exercises General Exercises - Upper Extremity Shoulder Flexion: AROM;AAROM;5 reps;Both;Supine General Exercises - Lower Extremity Ankle Circles/Pumps: AROM;Both;5 reps;Supine Heel Slides: AROM;Both;5 reps;Supine    General Comments        Pertinent Vitals/Pain Pain Assessment: No/denies pain    Home Living  Prior Function            PT Goals (current goals can now be found in the care plan section) Progress towards PT goals: Progressing toward goals    Frequency    Min 3X/week      PT Plan Current plan remains appropriate    Co-evaluation               AM-PAC PT "6 Clicks" Daily Activity  Outcome Measure  Difficulty turning over in bed (including adjusting bedclothes, sheets and blankets)?: Unable Difficulty moving from lying on back to sitting on the side of the bed? : Unable Difficulty sitting down on and standing up from a chair with arms (e.g., wheelchair, bedside commode, etc,.)?: Unable Help needed moving to and from a bed to chair (including a wheelchair)?: A Lot Help needed walking in hospital room?: A Lot Help needed climbing 3-5 steps with a railing? : Total 6 Click Score: 8    End of Session Equipment Utilized During Treatment: Gait belt Activity Tolerance: Patient tolerated treatment well Patient left: in chair;with call bell/phone within reach;with chair alarm set Nurse Communication: Mobility status PT Visit Diagnosis: Other symptoms and signs involving the nervous system (R29.898);Other abnormalities of gait and mobility (R26.89)     Time: 5003-7048 PT Time Calculation (min) (ACUTE ONLY): 23 min  Charges:  $Therapeutic Exercise: 8-22 mins $Therapeutic Activity: 8-22 mins                     [name] [name], PT Acute Rehabilitation Services [phone] 12/22/2017    [name] [name] 12/22/2017, 5:03 PM

## 2017-12-23 LAB — GLUCOSE, CAPILLARY
GLUCOSE-CAPILLARY: 136 mg/dL — AB (ref 70–99)
GLUCOSE-CAPILLARY: 144 mg/dL — AB (ref 70–99)
GLUCOSE-CAPILLARY: 135 mg/dL — AB (ref 70–99)
Glucose-Capillary: 138 mg/dL — ABNORMAL HIGH (ref 70–99)

## 2017-12-23 NOTE — Progress Notes (Signed)
PROGRESS NOTE    [name] [name]  WUJ:[id number] DOB: Mar 11, 1935 DOA: 12/18/2017 PCP: Clovia Cuff, MD   Brief Narrative:  83 year old with past medical history relevant for BPH, type 2 diabetes on oral hypoglycemics, hyperlipidemia, seizure disorder, Parkinson's disease, prior CVA admitted with right-sided weakness and found to have multiple small acute infarcts in the left MCA and left occipital lobe, acute lacunar infarct in right centrum semiovale as well as 98% left carotid stenosis status post left carotid endarterectomy on 12/21/2017.   Assessment & Plan:   Principal Problem:   Right sided weakness Active Problems:   Hyperkalemia   Acute renal failure (HCC)   HTN (hypertension)   Diabetes mellitus, type 2 (HCC)   Hyperlipidemia associated with type 2 diabetes mellitus (HCC)   Seizures (HCC)   UTI (urinary tract infection)   Cerebral embolism with cerebral infarction   CVA (cerebral vascular accident) ([place])   Acute ischemic stroke ([place])   History of CVA (cerebrovascular accident)   Parkinson disease ([place])   Diastolic dysfunction   Hypokalemia   #) CVA status post left carotid endarterectomy: -Neurology following, appreciate recommendations -Vascular surgery following, appreciate recommendations -Hemoglobin A1c 7.2, lipid panel shows LDL of 81 -Continue aspirin 81 mg -Continue clopidogrel 75 mg -Continue statin -Permissive hypertension -Per neurology recommendation is a 30-day event monitor -Physical therapy is recommending skilled nursing facility however patient is hesitant to see is the primary caregiver for his blind wife.  Will discuss with social work.  #)  type 2 diabetes: - Sliding scale insulin, AC at bedtime - Hold home sitagliptin 50 mill grams daily -Hold home metformin thousand mill grams twice daily  #) seizure disorder: - Continue levetiracetam 500 mg twice daily  #) Hyperlipidemia: -Continue statin  #) Sees BPH/UTI: -Patient completed a total  of 5 days for E. coli UTI -Continue tamsulosin 0.4 mg daily -Continue finasteride 5 mg daily  Fluids: Tolerating p.o. Elect lites: Monitor and supplement Exline nutrition: Carb restricted diet  Prophylaxis: Enoxaparin  Disposition: Pending skilled nursing facility  Full code    Consultants:   Vascular surgery  Neurology  Procedures: 12/18/2017 echo: - Left ventricle: The cavity size was normal. There was moderate   concentric hypertrophy. Systolic function was normal. The   estimated ejection fraction was in the range of 50% to 55%. There   is akinesis of the inferoseptal myocardium. Doppler parameters   are consistent with abnormal left ventricular relaxation (grade 1   diastolic dysfunction). Doppler parameters are consistent with   high ventricular filling pressure. - Aortic valve: Trileaflet; mildly thickened, mildly calcified   leaflets. There was trivial regurgitation. - Mitral valve: There was mild regurgitation. - Left atrium: The atrium was mildly dilated. - Right ventricle: The cavity size was mildly dilated. Wall   thickness was normal. - Tricuspid valve: There was trivial regurgitation. - Pulmonary arteries: Systolic pressure was mildly increased. PA   peak pressure: 39 mm Hg (S).  Impressions:  - No cardiac source of emboli was indentified.  12/18/2016 carotid ultrasound:Final Interpretation: Right Carotid: Velocities in the right ICA are consistent with a 1-39% stenosis.  Left Carotid: Velocities in the left ICA are consistent with a 80-99% stenosis.  Vertebrals: Bilateral vertebral arteries demonstrate antegrade flow.  12/21/2017 left carotid endarterectomy  Antimicrobials:   IV ceftriaxone 12/17/2017 to 12/22/2017   Subjective: Patient reports he is doing fairly well.  His incisional site is not bothering him much.  He denies any nausea, vomiting, diarrhea, cough, congestion, rhinorrhea.  He  reports that he does not want to go to a skilled  nursing facility as he is a primary caretaker for his wife who is blind as well as her son who is also blind.  Objective: Vitals:   12/22/17 1200 12/22/17 2000 12/23/17 0400 12/23/17 0750  BP: (!) 112/55 (!) 131/58 (!) 167/97 (!) 152/66  Pulse: 64 77 68 71  Resp:  20 20 19   Temp: 98.3 F (36.8 C) 98.5 F (36.9 C) 98.6 F (37 C) 97.8 F (36.6 C)  TempSrc: Oral Oral Oral Oral  SpO2: 90%   93%  Weight:      Height:        Intake/Output Summary (Last 24 hours) at 12/23/2017 1036 Last data filed at 12/23/2017 0750 Gross per 24 hour  Intake -  Output 900 ml  Net -900 ml   Filed Weights   12/18/17 1000  Weight: 102.1 kg    Examination:  General exam: Appears calm and comfortable  Respiratory system: Clear to auscultation. Respiratory effort normal. Cardiovascular system: Regular rate and rhythm, no murmurs a. Gastrointestinal system: Abdomen is nondistended, soft and nontender. No organomegaly or masses felt. Normal bowel sounds heard. Central nervous system: Alert and oriented.  3-4 out of 5 strength on right side on upper extremity, bilateral lower extremity strength 5 out of 5, cranial nerves II through XII intact Extremities: Trace lower extremity edema. Skin: No rashes over visible skin Psychiatry: Judgement and insight appear normal. Mood & affect appropriate.     Data Reviewed: I have personally reviewed following labs and imaging studies  CBC: Recent Labs  Lab 12/18/17 1026 12/22/17 0432  WBC 7.8 9.9  NEUTROABS 5.7  --   HGB 16.7 13.2  HCT 50.5 41.5  MCV 97.5 96.7  PLT 163 009   Basic Metabolic Panel: Recent Labs  Lab 12/18/17 1026 12/19/17 1233 12/22/17 0432  NA 139 138 140  K 5.4* 3.4* 3.7  CL 104 104 109  CO2 24 21* 25  GLUCOSE 233* 179* 148*  BUN 32* 23 27*  CREATININE 1.72* 1.50* 1.44*  CALCIUM 8.9 9.0 8.2*   GFR: Estimated Creatinine Clearance: 49 mL/min (A) (by C-G formula based on SCr of 1.44 mg/dL (H)). Liver Function Tests: Recent  Labs  Lab 12/18/17 1026  AST 31  ALT 14  ALKPHOS 88  BILITOT 1.5*  PROT 6.7  ALBUMIN 3.8   No results for input(s): LIPASE, AMYLASE in the last 168 hours. No results for input(s): AMMONIA in the last 168 hours. Coagulation Profile: Recent Labs  Lab 12/18/17 1026  INR 1.10   Cardiac Enzymes: No results for input(s): CKTOTAL, CKMB, CKMBINDEX, TROPONINI in the last 168 hours. BNP (last 3 results) No results for input(s): PROBNP in the last 8760 hours. HbA1C: No results for input(s): HGBA1C in the last 72 hours. CBG: Recent Labs  Lab 12/22/17 0631 12/22/17 1253 12/22/17 1617 12/22/17 2058 12/23/17 0605  GLUCAP 119* 185* 200* 150* 136*   Lipid Profile: No results for input(s): CHOL, HDL, LDLCALC, TRIG, CHOLHDL, LDLDIRECT in the last 72 hours. Thyroid Function Tests: No results for input(s): TSH, T4TOTAL, FREET4, T3FREE, THYROIDAB in the last 72 hours. Anemia Panel: No results for input(s): VITAMINB12, FOLATE, FERRITIN, TIBC, IRON, RETICCTPCT in the last 72 hours. Sepsis Labs: No results for input(s): PROCALCITON, LATICACIDVEN in the last 168 hours.  Recent Results (from the past 240 hour(s))  Urine culture     Status: Abnormal   Collection Time: 12/18/17  1:25 PM  Result Value  Ref Range Status   Specimen Description URINE, RANDOM  Final   Special Requests   Final    NONE Performed at [facility], [address], [place], [place] [address]    Culture >=100,000 COLONIES/mL ESCHERICHIA COLI (A)  Final   Report Status 12/20/2017 FINAL  Final   Organism ID, Bacteria ESCHERICHIA COLI (A)  Final      Susceptibility   Escherichia coli - MIC*    AMPICILLIN >=32 RESISTANT Resistant     CEFAZOLIN >=64 RESISTANT Resistant     CEFTRIAXONE <=1 SENSITIVE Sensitive     CIPROFLOXACIN >=4 RESISTANT Resistant     GENTAMICIN >=16 RESISTANT Resistant     IMIPENEM <=0.25 SENSITIVE Sensitive     NITROFURANTOIN 64 INTERMEDIATE Intermediate     TRIMETH/SULFA >=320 RESISTANT  Resistant     AMPICILLIN/SULBACTAM >=32 RESISTANT Resistant     PIP/TAZO 64 INTERMEDIATE Intermediate     Extended ESBL NEGATIVE Sensitive     * >=100,000 COLONIES/mL ESCHERICHIA COLI  Surgical pcr screen     Status: None   Collection Time: 12/20/17  9:16 AM  Result Value Ref Range Status   MRSA, PCR NEGATIVE NEGATIVE Final   Staphylococcus aureus NEGATIVE NEGATIVE Final    Comment: (NOTE) The Xpert SA Assay (FDA approved for NASAL specimens in patients 50 years of age and older), is one component of a comprehensive surveillance program. It is not intended to diagnose infection nor to guide or monitor treatment. Performed at [facility], [place] [address] [place], [place] [address]          Radiology Studies: No results found.      Scheduled Meds: .  stroke: mapping our early stages of recovery book   Does not apply Once  . aspirin EC  81 mg Oral Daily  . clopidogrel  75 mg Oral Daily  . docusate sodium  100 mg Oral Daily  . finasteride  5 mg Oral Daily  . heparin  5,000 Units Subcutaneous Q8H  . Influenza vac split quadrivalent PF  0.5 mL Intramuscular Tomorrow-1000  . insulin aspart  0-5 Units Subcutaneous QHS  . insulin aspart  0-9 Units Subcutaneous TID WC  . levETIRAcetam  500 mg Oral BID  . pantoprazole  40 mg Oral Daily  . simvastatin  40 mg Oral Daily  . tamsulosin  0.4 mg Oral QPC supper   Continuous Infusions: . sodium chloride    . sodium chloride    . cefTRIAXone (ROCEPHIN)  IV 1 g (12/22/17 1401)     LOS: 3 days    Time spent: [place], MD [facility]  If 7PM-7AM, please contact night-coverage [email or web address] Password [email or web address] 12/23/2017, 10:36 AM

## 2017-12-23 NOTE — Progress Notes (Signed)
   VASCULAR SURGERY ASSESSMENT & PLAN:   2 Days Post-Op s/p: Left carotid endarterectomy.  Doing well from this standpoint.  Neurologic exam is at baseline.  He will have dual antiplatelet therapy for 3 weeks with aspirin and Plavix.  He will then just be on Plavix.  He is on a statin.  Physical therapy has recommended placement to a skilled nursing facility although he would prefer to go home.  I would agree that he will need continued physical therapy at a skilled nursing facility.  Awaiting bed availability.  SUBJECTIVE:   No complaints this morning.  PHYSICAL EXAM:   Vitals:   12/22/17 0751 12/22/17 1200 12/22/17 2000 12/23/17 0400  BP: 124/69 (!) 112/55 (!) 131/58 (!) 167/97  Pulse: 60 64 77 68  Resp: 18  20 20   Temp: 98.1 F (36.7 C) 98.3 F (36.8 C) 98.5 F (36.9 C) 98.6 F (37 C)  TempSrc: Oral Oral Oral Oral  SpO2: 93% 90%    Weight:      Height:       His neurologic exam is at baseline with some right upper extremity and mild right lower extremity weakness. His left neck incision looks fine.  LABS:   Lab Results  Component Value Date   WBC 9.9 12/22/2017   HGB 13.2 12/22/2017   HCT 41.5 12/22/2017   MCV 96.7 12/22/2017   PLT 171 12/22/2017   Lab Results  Component Value Date   CREATININE 1.44 (H) 12/22/2017   Lab Results  Component Value Date   INR 1.10 12/18/2017   CBG (last 3)  Recent Labs    12/22/17 1617 12/22/17 2058 12/23/17 0605  GLUCAP 200* 150* 136*    PROBLEM LIST:    Principal Problem:   Right sided weakness Active Problems:   Hyperkalemia   Acute renal failure (HCC)   HTN (hypertension)   Diabetes mellitus, type 2 (HCC)   Hyperlipidemia associated with type 2 diabetes mellitus (HCC)   Seizures (HCC)   UTI (urinary tract infection)   Cerebral embolism with cerebral infarction   CVA (cerebral vascular accident) ([place])   Acute ischemic stroke ([place])   History of CVA (cerebrovascular accident)   Parkinson disease ([place])  Diastolic dysfunction   Hypokalemia   CURRENT MEDS:   .  stroke: mapping our early stages of recovery book   Does not apply Once  . aspirin EC  81 mg Oral Daily  . clopidogrel  75 mg Oral Daily  . docusate sodium  100 mg Oral Daily  . finasteride  5 mg Oral Daily  . heparin  5,000 Units Subcutaneous Q8H  . Influenza vac split quadrivalent PF  0.5 mL Intramuscular Tomorrow-1000  . insulin aspart  0-5 Units Subcutaneous QHS  . insulin aspart  0-9 Units Subcutaneous TID WC  . levETIRAcetam  500 mg Oral BID  . pantoprazole  40 mg Oral Daily  . simvastatin  40 mg Oral Daily  . tamsulosin  0.4 mg Oral QPC supper    Deitra Mayo Beeper: [phone] Office: [phone] 12/23/2017

## 2017-12-23 NOTE — Care Management Important Message (Signed)
Important Message  Patient Details  Name: [name] [name] MRN: [id number] Date of Birth: 1934/11/04   Medicare Important Message Given:  Yes    [name] 12/23/2017, 4:41 PM

## 2017-12-23 NOTE — Plan of Care (Signed)
  Problem: Health Behavior/Discharge Planning: Goal: Ability to manage health-related needs will improve Outcome: Progressing   Problem: Clinical Measurements: Goal: Will remain free from infection Outcome: Progressing   Problem: Skin Integrity: Goal: Risk for impaired skin integrity will decrease Outcome: Progressing   Problem: Self-Care: Goal: Ability to communicate needs accurately will improve Outcome: Progressing

## 2017-12-24 LAB — BASIC METABOLIC PANEL
Anion gap: 11 (ref 5–15)
BUN: 21 mg/dL (ref 8–23)
CO2: 23 mmol/L (ref 22–32)
Chloride: 108 mmol/L (ref 98–111)
GFR calc Af Amer: 48 mL/min — ABNORMAL LOW (ref >60)
GFR calc non Af Amer: 42 mL/min — ABNORMAL LOW (ref >60)
Glucose, Bld: 121 mg/dL — ABNORMAL HIGH (ref 70–99)
Potassium: 3.3 mmol/L — ABNORMAL LOW (ref 3.5–5.1)
Sodium: 142 mmol/L (ref 135–145)

## 2017-12-24 LAB — BASIC METABOLIC PANEL WITH GFR
Calcium: 8.6 mg/dL — ABNORMAL LOW (ref 8.9–10.3)
Creatinine, Ser: 1.52 mg/dL — ABNORMAL HIGH (ref 0.61–1.24)

## 2017-12-24 LAB — GLUCOSE, CAPILLARY
GLUCOSE-CAPILLARY: 161 mg/dL — AB (ref 70–99)
GLUCOSE-CAPILLARY: 135 mg/dL — AB (ref 70–99)
Glucose-Capillary: 120 mg/dL — ABNORMAL HIGH (ref 70–99)
Glucose-Capillary: 124 mg/dL — ABNORMAL HIGH (ref 70–99)

## 2017-12-24 MED ORDER — POTASSIUM CHLORIDE CRYS ER 20 MEQ PO TBCR
40.0000 meq | EXTENDED_RELEASE_TABLET | Freq: Once | ORAL | Status: AC
  Administered 2017-12-24: 40 meq via ORAL
  Filled 2017-12-24: qty 2

## 2017-12-24 MED ORDER — LOPERAMIDE HCL 2 MG PO CAPS
2.0000 mg | ORAL_CAPSULE | ORAL | Status: DC | PRN
  Administered 2017-12-24 – 2017-12-26 (×3): 2 mg via ORAL
  Filled 2017-12-24 (×3): qty 1

## 2017-12-24 NOTE — Progress Notes (Addendum)
  Progress Note  12/24/2017 8:00 AM 3 Days Post-Op  Subjective:  Eating breakfast; says he is doing fine this morning.  No trouble swallowing.  Afebrile HR 60's-80's  588'F-027'X systolic 41% RA  Vitals:   12/23/17 2200 12/24/17 0536  BP: (!) 156/85   Pulse: 88   Resp: (!) 24   Temp: 98.9 F (37.2 C) 98.4 F (36.9 C)  SpO2: 100%      Physical Exam: Neck incision looks good Good hand grip right hand Able to wiggle toes on both feet.   CBC    Component Value Date/Time   WBC 9.9 12/22/2017 0432   RBC 4.29 12/22/2017 0432   HGB 13.2 12/22/2017 0432   HCT 41.5 12/22/2017 0432   PLT 171 12/22/2017 0432   MCV 96.7 12/22/2017 0432   MCH 30.8 12/22/2017 0432   MCHC 31.8 12/22/2017 0432   RDW 12.8 12/22/2017 0432   LYMPHSABS 1.2 12/18/2017 1026   MONOABS 0.5 12/18/2017 1026   EOSABS 0.3 12/18/2017 1026   BASOSABS 0.1 12/18/2017 1026    BMET    Component Value Date/Time   NA 142 12/24/2017 0338   NA 142 09/06/2015 1003   K 3.3 (L) 12/24/2017 0338   CL 108 12/24/2017 0338   CO2 23 12/24/2017 0338   GLUCOSE 121 (H) 12/24/2017 0338   BUN 21 12/24/2017 0338   BUN 25 09/06/2015 1003   CREATININE 1.52 (H) 12/24/2017 0338   CALCIUM 8.6 (L) 12/24/2017 0338   GFRNONAA 42 (L) 12/24/2017 0338   GFRAA 48 (L) 12/24/2017 0338     Intake/Output Summary (Last 24 hours) at 12/24/2017 0800 Last data filed at 12/24/2017 0536 Gross per 24 hour  Intake 200 ml  Output 1185 ml  Net -985 ml     Assessment/Plan:  This is a 83 y.o. [name] [name] who is s/p left CEA 3 Days Post-Op  -pt is doing well this am. -pt neuro at baseline -He will have dual antiplatelet therapy for 3 weeks with aspirin and Plavix.  He will then just be on Plavix.  He is on a statin. -Physical therapy has recommended placement to a skilled nursing facility although he would prefer to go home.  I would agree that he will need continued physical therapy at a skilled nursing facility.  Awaiting bed  availability. -f/u with Dr. Scot Dock in a couple of weeks.  Leontine Locket, PA-C [facility] [phone]  I have interviewed the patient and examined the patient. I agree with the findings by the PA. Possibly for CIR.  We will be available as needed.  I will see him back in the office in around 3 weeks.  Gae Gallop, MD [phone]

## 2017-12-24 NOTE — Progress Notes (Signed)
Inpatient Rehabilitation-Admissions Coordinator   Noted pt is unable to go to SNF at DC. AC was contacted to determine if CIR would be an option as he would currently be unsafe to return home at current functional level. AC has discussed case with PM&R physician Dr. Delice Lesch. If pt is willing to participate in an intensive therapy program in order to reduce burden of care prior to DC home with his significant other who is only able to provide limited assistance, then CIR could accept pt.    AC has begun insurance authorization for possible admit. Will follow up Monday.   Please call if questions.   Jhonnie Garner, OTR/L  Rehab Admissions Coordinator  [phone] 12/24/2017 5:20 PM

## 2017-12-24 NOTE — Progress Notes (Signed)
Patient has had two to three bowel movements that were water in appearance with mucous.

## 2017-12-24 NOTE — Progress Notes (Signed)
Occupational Therapy Treatment Patient Details Name: [name] [name] MRN: [id number] DOB: 08/22/1934 Today's Date: 12/24/2017    History of present illness Pt is a 83 y.o. male with Right weakness and HA  with multiple small acute infarcts in the left MCA and left occipital lobe, acute lacunar infarct in right centrum semiovale, significant left ICA stenosis s/p L CEA 10/8. PMHx: recurrent UTIs, seizures, CVA, Parkinson disease, hypertension, CKD stage III, DM   OT comments  Pt. Seen for skilled OT session with focus on integration of use of R hand/UE during ADLS.  Completed washing face and oral care with mod cues for compensatory techniques and use of R hand.  Notable R neglect with RUE during tasks.  Cues to reposition for protection of UE.  Pt. Remains candidate for CIR level therapies as he is primary care giver to himself and spouse who is blind.  Pt. Needs continued higher level therapies if plan remains home.    Follow Up Recommendations  CIR    Equipment Recommendations  Other (comment)    Recommendations for Other Services      Precautions / Restrictions Precautions Precautions: Fall Precaution Comments: L ankle monitor        Mobility Bed Mobility Overal bed mobility: Needs Assistance Bed Mobility: Rolling;Sidelying to Sit           General bed mobility comments: pt able to roll with rail to right with HOB 30 degrees and bring legs off of bed. Pt adamant he could move on his own and after several minutes, cues and attempts pt required mod assist to elevate trunk from surface into sitting from elevated sidelying  Transfers Overall transfer level: Needs assistance   Transfers: Sit to/from Stand;Squat Pivot Transfers Sit to Stand: Min assist   Squat pivot transfers: Mod assist;Min assist     General transfer comment: pt stood from bed with crouched posture and right knee blocked min assist very briefly then returned to crouched posture to pivot to right with right  knee blocked, assist for trunk support and balance with mod +2 assist to pivot to right with increased time. Pt then performed squat pivot to left X2 with cues for sequence, min assist to block knee and control trunk    Balance Overall balance assessment: Needs assistance   Sitting balance-Leahy Scale: Fair Sitting balance - Comments: pt able to sit EOB without UE support in static sitting     Standing balance-Leahy Scale: Poor                             ADL either performed or assessed with clinical judgement   ADL Overall ADL's : Needs assistance/impaired Eating/Feeding: Minimal assistance;Sitting Eating/Feeding Details (indicate cue type and reason): encouraged use of both hands for managing packages and containers.  pt. unable to lift lid off of plate cover with B hands without assistance Grooming: Wash/dry hands;Wash/dry face;Oral care Grooming Details (indicate cue type and reason): seated in recliner. max cues for right hand integration into tasks.  able to stablalize tube of t.paste with R hand and squeeze onto t.brush held by L hand.  notable R neglect of UE without cues.  cont. to rely on use of lue during tasks                               General ADL Comments: Pt with slow processing, initation and sequencing.  Limited by decreased coordination and strength of R dominant UE.      Vision       Perception     Praxis      Cognition Arousal/Alertness: Awake/alert Behavior During Therapy: WFL for tasks assessed/performed Overall Cognitive Status: Impaired/Different from baseline Area of Impairment: Safety/judgement;Problem solving;Following commands;Awareness                       Following Commands: Follows one step commands with increased time Safety/Judgement: Decreased awareness of safety;Decreased awareness of deficits   Problem Solving: Slow processing;Requires verbal cues;Requires tactile cues          Exercises      Shoulder Instructions       General Comments      Pertinent Vitals/ Pain       Pain Assessment: No/denies pain  Home Living                                          Prior Functioning/Environment              Frequency           Progress Toward Goals  OT Goals(current goals can now be found in the care plan section)  Progress towards OT goals: Progressing toward goals     Plan      Co-evaluation                 AM-PAC PT "6 Clicks" Daily Activity     Outcome Measure   Help from another person eating meals?: A Little Help from another person taking care of personal grooming?: A Lot Help from another person toileting, which includes using toliet, bedpan, or urinal?: Total Help from another person bathing (including washing, rinsing, drying)?: A Lot Help from another person to put on and taking off regular upper body clothing?: A Lot Help from another person to put on and taking off regular lower body clothing?: Total 6 Click Score: 11    End of Session    OT Visit Diagnosis: Unsteadiness on feet (R26.81);Other abnormalities of gait and mobility (R26.89);Muscle weakness (generalized) (M62.81);Other symptoms and signs involving cognitive function;Hemiplegia and hemiparesis Hemiplegia - Right/Left: Right Hemiplegia - dominant/non-dominant: Dominant Hemiplegia - caused by: Other cerebrovascular disease   Activity Tolerance Patient tolerated treatment well   Patient Left in chair;with call bell/phone within reach   Nurse Communication          Time: 1109-1130 OT Time Calculation (min): 21 min  Charges: OT Treatments $Self Care/Home Management : 8-22 mins   Janice Coffin, COTA/L 12/24/2017, 12:10 PM

## 2017-12-24 NOTE — Progress Notes (Signed)
Rehab Admissions Coordinator Note:  Patient was screened by Cleatrice Burke for appropriateness for an Inpatient Acute Rehab Consult per PT recommendation.   At this time, we are recommending Inpatient Rehab consult. I will contact Dr. Herbert Moors for consult.  Cleatrice Burke 12/24/2017, 9:25 AM  I can be reached at [phone].

## 2017-12-24 NOTE — Progress Notes (Signed)
Physical Therapy Treatment Patient Details Name: [name] [name] MRN: [id number] DOB: Jul 15, 1934 Today's Date: 12/24/2017    History of Present Illness Pt is a 83 y.o. male with Right weakness and HA  with multiple small acute infarcts in the left MCA and left occipital lobe, acute lacunar infarct in right centrum semiovale, significant left ICA stenosis s/p L CEA 10/8. PMHx: recurrent UTIs, seizures, CVA, Parkinson disease, hypertension, CKD stage III, DM    PT Comments    Pt pleasant and very eager to return home. Pt making functional improvements and progressing with pivot transfers and mobility. He is unable to go to SNF and would benefit greatly from CIR prior to returning home with wife who is blind and was assisting with bathing at times and homemaking with pt who was mod I with RW or cane prior to admission. Pt very motivated and with intensive therapy could achieve minguard or supervision level for basic mobility to return home, discussed with rehab admission coordinator. Pt does have decreased awareness of deficits and will benefit from additional training to decrease fall risk and burden of care for wife.   HR 116 with EOB and 120 with transfers    Follow Up Recommendations  CIR;Supervision/Assistance - 24 hour     Equipment Recommendations  3in1 (PT)    Recommendations for Other Services Rehab consult     Precautions / Restrictions Precautions Precautions: Fall    Mobility  Bed Mobility Overal bed mobility: Needs Assistance Bed Mobility: Rolling;Sidelying to Sit           General bed mobility comments: pt able to roll with rail to right with HOB 30 degrees and bring legs off of bed. Pt adamant he could move on his own and after several minutes, cues and attempts pt required mod assist to elevate trunk from surface into sitting from elevated sidelying  Transfers Overall transfer level: Needs assistance   Transfers: Sit to/from Stand;Squat Pivot Transfers Sit to  Stand: Min assist   Squat pivot transfers: Mod assist;Min assist     General transfer comment: pt stood from bed with crouched posture and right knee blocked min assist very briefly then returned to crouched posture to pivot to right with right knee blocked, assist for trunk support and balance with mod +2 assist to pivot to right with increased time. Pt then performed squat pivot to left X2 with cues for sequence, min assist to block knee and control trunk  Ambulation/Gait                 Stairs             Wheelchair Mobility    Modified Rankin (Stroke Patients Only) Modified Rankin (Stroke Patients Only) Pre-Morbid Rankin Score: Moderate disability Modified Rankin: Severe disability     Balance Overall balance assessment: Needs assistance   Sitting balance-Leahy Scale: Fair Sitting balance - Comments: pt able to sit EOB without UE support in static sitting     Standing balance-Leahy Scale: Poor                              Cognition Arousal/Alertness: Awake/alert Behavior During Therapy: WFL for tasks assessed/performed Overall Cognitive Status: Impaired/Different from baseline Area of Impairment: Safety/judgement;Problem solving;Following commands;Awareness                       Following Commands: Follows one step commands with increased time Safety/Judgement: Decreased awareness  of safety;Decreased awareness of deficits   Problem Solving: Slow processing;Requires verbal cues;Requires tactile cues        Exercises      General Comments        Pertinent Vitals/Pain Pain Assessment: No/denies pain    Home Living                      Prior Function            PT Goals (current goals can now be found in the care plan section) Progress towards PT goals: Progressing toward goals    Frequency           PT Plan Discharge plan needs to be updated    Co-evaluation              AM-PAC PT "6 Clicks"  Daily Activity  Outcome Measure  Difficulty turning over in bed (including adjusting bedclothes, sheets and blankets)?: A Lot Difficulty moving from lying on back to sitting on the side of the bed? : Unable Difficulty sitting down on and standing up from a chair with arms (e.g., wheelchair, bedside commode, etc,.)?: Unable Help needed moving to and from a bed to chair (including a wheelchair)?: A Lot Help needed walking in hospital room?: Total Help needed climbing 3-5 steps with a railing? : Total 6 Click Score: 8    End of Session Equipment Utilized During Treatment: Gait belt Activity Tolerance: Patient tolerated treatment well Patient left: in chair;with call bell/phone within reach;with chair alarm set Nurse Communication: Mobility status PT Visit Diagnosis: Other symptoms and signs involving the nervous system (R29.898);Other abnormalities of gait and mobility (R26.89);Unsteadiness on feet (R26.81)     Time: 7741-2878 PT Time Calculation (min) (ACUTE ONLY): 27 min  Charges:  $Therapeutic Activity: 23-37 mins                     Alba, PT Acute Rehabilitation Services Pager: [phone] Office: [phone]    Sandy Salaam Raynell Scott 12/24/2017, 9:13 AM

## 2017-12-24 NOTE — Progress Notes (Signed)
PROGRESS NOTE    [name] [name]  WUJ:[id number] DOB: [birth year] DOA: 12/18/2017 PCP: Clovia Cuff, MD   Brief Narrative:  83 year old with past medical history relevant for BPH, type 2 diabetes on oral hypoglycemics, hyperlipidemia, seizure disorder, Parkinson's disease, prior CVA admitted with right-sided weakness and found to have multiple small acute infarcts in the left MCA and left occipital lobe, acute lacunar infarct in right centrum semiovale as well as 98% left carotid stenosis status post left carotid endarterectomy on 12/21/2017.   Assessment & Plan:   Principal Problem:   Right sided weakness Active Problems:   Hyperkalemia   Acute renal failure (HCC)   HTN (hypertension)   Diabetes mellitus, type 2 (HCC)   Hyperlipidemia associated with type 2 diabetes mellitus (HCC)   Seizures (HCC)   UTI (urinary tract infection)   Cerebral embolism with cerebral infarction   CVA (cerebral vascular accident) ([place])   Acute ischemic stroke ([place])   History of CVA (cerebrovascular accident)   Parkinson disease ([place])   Diastolic dysfunction   Hypokalemia   #) CVA status post left carotid endarterectomy: -Neurology following, appreciate recommendations -Vascular surgery following, appreciate recommendations -Hemoglobin A1c 7.2, lipid panel shows LDL of 81 -Continue aspirin 81 mg -Continue clopidogrel 75 mg -Continue statin -Per neurology recommendation is a 30-day event monitor -Physical therapy is inpatient rehab, unfortunately skilled nursing facility placement is complicated by his legal history  #)  type 2 diabetes: - Sliding scale insulin, AC at bedtime - Hold home sitagliptin 50 mill grams daily -Hold home metformin thousand mill grams twice daily  #) seizure disorder: - Continue levetiracetam 500 mg twice daily  #) Hyperlipidemia: -Continue statin  #) Sees BPH/UTI: -Patient completed a total of 5 days for E. coli UTI -Continue tamsulosin 0.4 mg daily -Continue  finasteride 5 mg daily  Fluids: Tolerating p.o. Elect lites: Monitor and supplement  nutrition: Carb restricted diet  Prophylaxis: Enoxaparin  Disposition: Pending evaluation by inpatient rehab  Full code    Consultants:   Vascular surgery  Neurology  Procedures: 12/18/2017 echo: - Left ventricle: The cavity size was normal. There was moderate   concentric hypertrophy. Systolic function was normal. The   estimated ejection fraction was in the range of 50% to 55%. There   is akinesis of the inferoseptal myocardium. Doppler parameters   are consistent with abnormal left ventricular relaxation (grade 1   diastolic dysfunction). Doppler parameters are consistent with   high ventricular filling pressure. - Aortic valve: Trileaflet; mildly thickened, mildly calcified   leaflets. There was trivial regurgitation. - Mitral valve: There was mild regurgitation. - Left atrium: The atrium was mildly dilated. - Right ventricle: The cavity size was mildly dilated. Wall   thickness was normal. - Tricuspid valve: There was trivial regurgitation. - Pulmonary arteries: Systolic pressure was mildly increased. PA   peak pressure: 39 mm Hg (S).  Impressions:  - No cardiac source of emboli was indentified.  12/18/2016 carotid ultrasound:Final Interpretation: Right Carotid: Velocities in the right ICA are consistent with a 1-39% stenosis.  Left Carotid: Velocities in the left ICA are consistent with a 80-99% stenosis.  Vertebrals: Bilateral vertebral arteries demonstrate antegrade flow.  12/21/2017 left carotid endarterectomy  Antimicrobials:   IV ceftriaxone 12/17/2017 to 12/22/2017   Subjective: Patient reports he is doing fairly well.  He reports his incisional site has some swelling but no discharge.  He denies any nausea, vomiting, diarrhea, cough, congestion.  Objective: Vitals:   12/23/17 0750 12/23/17 2200 12/24/17 0536 12/24/17  0902  BP: (!) 152/66 (!) 156/85    Pulse:  71 88  (!) 120  Resp: 19 (!) 24    Temp: 97.8 F (36.6 C) 98.9 F (37.2 C) 98.4 F (36.9 C)   TempSrc: Oral Oral Oral   SpO2: 93% 100%    Weight:      Height:        Intake/Output Summary (Last 24 hours) at 12/24/2017 0954 Last data filed at 12/24/2017 0815 Gross per 24 hour  Intake 200 ml  Output 1465 ml  Net -1265 ml   Filed Weights   12/18/17 1000  Weight: 102.1 kg    Examination:  General exam: Appears calm and comfortable  Respiratory system: Clear to auscultation. Respiratory effort normal. Cardiovascular system: Regular rate and rhythm, no murmurs a. Gastrointestinal system: Abdomen is nondistended, soft and nontender. No organomegaly or masses felt. Normal bowel sounds heard. Central nervous system: Alert and oriented.  3-4 out of 5 strength on right side on upper extremity, bilateral lower extremity strength 5 out of 5, cranial nerves II through XII intact Extremities: Trace lower extremity edema. Skin: Left neck incision site is clean dry and intact Psychiatry: Judgement and insight appear normal. Mood & affect appropriate.     Data Reviewed: I have personally reviewed following labs and imaging studies  CBC: Recent Labs  Lab 12/18/17 1026 12/22/17 0432  WBC 7.8 9.9  NEUTROABS 5.7  --   HGB 16.7 13.2  HCT 50.5 41.5  MCV 97.5 96.7  PLT 163 630   Basic Metabolic Panel: Recent Labs  Lab 12/18/17 1026 12/19/17 1233 12/22/17 0432 12/24/17 0338  NA 139 138 140 142  K 5.4* 3.4* 3.7 3.3*  CL 104 104 109 108  CO2 24 21* 25 23  GLUCOSE 233* 179* 148* 121*  BUN 32* 23 27* 21  CREATININE 1.72* 1.50* 1.44* 1.52*  CALCIUM 8.9 9.0 8.2* 8.6*   GFR: Estimated Creatinine Clearance: 46.4 mL/min (A) (by C-G formula based on SCr of 1.52 mg/dL (H)). Liver Function Tests: Recent Labs  Lab 12/18/17 1026  AST 31  ALT 14  ALKPHOS 88  BILITOT 1.5*  PROT 6.7  ALBUMIN 3.8   No results for input(s): LIPASE, AMYLASE in the last 168 hours. No results for  input(s): AMMONIA in the last 168 hours. Coagulation Profile: Recent Labs  Lab 12/18/17 1026  INR 1.10   Cardiac Enzymes: No results for input(s): CKTOTAL, CKMB, CKMBINDEX, TROPONINI in the last 168 hours. BNP (last 3 results) No results for input(s): PROBNP in the last 8760 hours. HbA1C: No results for input(s): HGBA1C in the last 72 hours. CBG: Recent Labs  Lab 12/23/17 0605 12/23/17 1102 12/23/17 1618 12/23/17 2157 12/24/17 0645  GLUCAP 136* 138* 144* 135* 120*   Lipid Profile: No results for input(s): CHOL, HDL, LDLCALC, TRIG, CHOLHDL, LDLDIRECT in the last 72 hours. Thyroid Function Tests: No results for input(s): TSH, T4TOTAL, FREET4, T3FREE, THYROIDAB in the last 72 hours. Anemia Panel: No results for input(s): VITAMINB12, FOLATE, FERRITIN, TIBC, IRON, RETICCTPCT in the last 72 hours. Sepsis Labs: No results for input(s): PROCALCITON, LATICACIDVEN in the last 168 hours.  Recent Results (from the past 240 hour(s))  Urine culture     Status: Abnormal   Collection Time: 12/18/17  1:25 PM  Result Value Ref Range Status   Specimen Description URINE, RANDOM  Final   Special Requests   Final    NONE Performed at [facility], [address], [place], [place]  [address]    Culture >=100,000 COLONIES/mL ESCHERICHIA COLI (A)  Final   Report Status 12/20/2017 FINAL  Final   Organism ID, Bacteria ESCHERICHIA COLI (A)  Final      Susceptibility   Escherichia coli - MIC*    AMPICILLIN >=32 RESISTANT Resistant     CEFAZOLIN >=64 RESISTANT Resistant     CEFTRIAXONE <=1 SENSITIVE Sensitive     CIPROFLOXACIN >=4 RESISTANT Resistant     GENTAMICIN >=16 RESISTANT Resistant     IMIPENEM <=0.25 SENSITIVE Sensitive     NITROFURANTOIN 64 INTERMEDIATE Intermediate     TRIMETH/SULFA >=320 RESISTANT Resistant     AMPICILLIN/SULBACTAM >=32 RESISTANT Resistant     PIP/TAZO 64 INTERMEDIATE Intermediate     Extended ESBL NEGATIVE Sensitive     * >=100,000 COLONIES/mL ESCHERICHIA  COLI  Surgical pcr screen     Status: None   Collection Time: 12/20/17  9:16 AM  Result Value Ref Range Status   MRSA, PCR NEGATIVE NEGATIVE Final   Staphylococcus aureus NEGATIVE NEGATIVE Final    Comment: (NOTE) The Xpert SA Assay (FDA approved for NASAL specimens in patients 85 years of age and older), is one component of a comprehensive surveillance program. It is not intended to diagnose infection nor to guide or monitor treatment. Performed at [facility], [place] [address] [place], [place] [address]          Radiology Studies: No results found.      Scheduled Meds: .  stroke: mapping our early stages of recovery book   Does not apply Once  . aspirin EC  81 mg Oral Daily  . clopidogrel  75 mg Oral Daily  . docusate sodium  100 mg Oral Daily  . finasteride  5 mg Oral Daily  . heparin  5,000 Units Subcutaneous Q8H  . Influenza vac split quadrivalent PF  0.5 mL Intramuscular Tomorrow-1000  . insulin aspart  0-5 Units Subcutaneous QHS  . insulin aspart  0-9 Units Subcutaneous TID WC  . levETIRAcetam  500 mg Oral BID  . pantoprazole  40 mg Oral Daily  . simvastatin  40 mg Oral Daily  . tamsulosin  0.4 mg Oral QPC supper   Continuous Infusions: . sodium chloride    . sodium chloride       LOS: 4 days    Time spent: [place], MD [facility]  If 7PM-7AM, please contact night-coverage [email or web address] Password [email or web address] 12/24/2017, 9:54 AM

## 2017-12-24 NOTE — Discharge Instructions (Signed)
° °  [facility] ° °Discharge Instructions °  °Carotid Endarterectomy (CEA) ° °Please refer to the following instructions for your post-procedure care. Your surgeon or physician assistant will discuss any changes with you. ° °Activity ° °You are encouraged to walk as much as you can. You can slowly return to normal activities but must avoid strenuous activity and heavy lifting until your doctor tell you it's okay. Avoid activities such as vacuuming or swinging a golf club. You can drive after one week if you are comfortable and you are no longer taking prescription pain medications. It is normal to feel tired for serval weeks after your surgery. It is also normal to have difficulty with sleep habits, eating, and bowel movements after surgery. These will go away with time. ° °Bathing/Showering ° °Shower daily after you go home. Do not soak in a bathtub, hot tub, or swim until the incision heals completely. ° °Incision Care ° °Shower every day. Clean your incision with mild soap and water. Pat the area dry with a clean towel. You do not need a bandage unless otherwise instructed. Do not apply any ointments or creams to your incision. You may have skin glue on your incision. Do not peel it off. It will come off on its own in about one week. Your incision may feel thickened and raised for several weeks after your surgery. This is normal and the skin will soften over time.  ° °For Men Only: It's okay to shave around the incision but do not shave the incision itself for 2 weeks. It is common to have numbness under your chin that could last for several months. ° °Diet ° °Resume your normal diet. There are no special food restrictions following this procedure. A low fat/low cholesterol diet is recommended for all patients with vascular disease. In order to heal from your surgery, it is CRITICAL to get adequate nutrition. Your body requires vitamins, minerals, and protein. Vegetables are the  best source of vitamins and minerals. Vegetables also provide the perfect balance of protein. Processed food has little nutritional value, so try to avoid this. ° °Medications ° °Resume taking all of your medications unless your doctor or physician assistant tells you not to. If your incision is causing pain, you may take over-the- counter pain relievers such as acetaminophen (Tylenol). If you were prescribed a stronger pain medication, please be aware these medications can cause nausea and constipation. Prevent nausea by taking the medication with a snack or meal. Avoid constipation by drinking plenty of fluids and eating foods with a high amount of fiber, such as fruits, vegetables, and grains.  °Do not take Tylenol if you are taking prescription pain medications. ° °Follow Up ° °Our office will schedule a follow up appointment 2-3 weeks following discharge. ° °Please call us immediately for any of the following conditions ° °Increased pain, redness, drainage (pus) from your incision site. °Fever of 101 degrees or higher. °If you should develop stroke (slurred speech, difficulty swallowing, weakness on one side of your body, loss of vision) you should call 911 and go to the nearest emergency room. ° °Reduce your risk of vascular disease: ° °Stop smoking. If you would like help call [facility] at 1-800-QUIT-NOW (1-800-784-8669) or [place] at [phone]. °Manage your cholesterol °Maintain a desired weight °Control your diabetes °Keep your blood pressure down ° °If you have any questions, please call the office at [phone]. ° °

## 2017-12-25 LAB — GLUCOSE, CAPILLARY
GLUCOSE-CAPILLARY: 168 mg/dL — AB (ref 70–99)
GLUCOSE-CAPILLARY: 151 mg/dL — AB (ref 70–99)
Glucose-Capillary: 128 mg/dL — ABNORMAL HIGH (ref 70–99)
Glucose-Capillary: 118 mg/dL — ABNORMAL HIGH (ref 70–99)

## 2017-12-25 LAB — BASIC METABOLIC PANEL
Anion gap: 11 (ref 5–15)
GFR calc non Af Amer: 45 mL/min — ABNORMAL LOW (ref >60)
Glucose, Bld: 119 mg/dL — ABNORMAL HIGH (ref 70–99)
Potassium: 3.3 mmol/L — ABNORMAL LOW (ref 3.5–5.1)
Sodium: 141 mmol/L (ref 135–145)

## 2017-12-25 LAB — BASIC METABOLIC PANEL WITH GFR
BUN: 22 mg/dL (ref 8–23)
CO2: 22 mmol/L (ref 22–32)
Calcium: 8.8 mg/dL — ABNORMAL LOW (ref 8.9–10.3)
Chloride: 108 mmol/L (ref 98–111)
Creatinine, Ser: 1.42 mg/dL — ABNORMAL HIGH (ref 0.61–1.24)
GFR calc Af Amer: 52 mL/min — ABNORMAL LOW (ref >60)

## 2017-12-25 MED ORDER — POTASSIUM CHLORIDE CRYS ER 20 MEQ PO TBCR
40.0000 meq | EXTENDED_RELEASE_TABLET | ORAL | Status: AC
  Administered 2017-12-25 (×2): 40 meq via ORAL
  Filled 2017-12-25 (×2): qty 2

## 2017-12-25 NOTE — Plan of Care (Signed)
Patient able to perform ADLs with minimal assistance. Continue to work on right side strength.

## 2017-12-25 NOTE — Progress Notes (Signed)
PROGRESS NOTE    [name] [name]  WNI:[id number] DOB: 1934/07/22 DOA: 12/18/2017 PCP: Clovia Cuff, MD   Brief Narrative:  83 year old with past medical history relevant for BPH, type 2 diabetes on oral hypoglycemics, hyperlipidemia, seizure disorder, Parkinson's disease, prior CVA admitted with right-sided weakness and found to have multiple small acute infarcts in the left MCA and left occipital lobe, acute lacunar infarct in right centrum semiovale as well as 98% left carotid stenosis status post left carotid endarterectomy on 12/21/2017.   Assessment & Plan:   Principal Problem:   Right sided weakness Active Problems:   Hyperkalemia   Acute renal failure (HCC)   HTN (hypertension)   Diabetes mellitus, type 2 (HCC)   Hyperlipidemia associated with type 2 diabetes mellitus (HCC)   Seizures (HCC)   UTI (urinary tract infection)   Cerebral embolism with cerebral infarction   CVA (cerebral vascular accident) ([place])   Acute ischemic stroke ([place])   History of CVA (cerebrovascular accident)   Parkinson disease ([place])   Diastolic dysfunction   Hypokalemia   #) CVA status post left carotid endarterectomy on 12/21/17: -Neurology following, appreciate recommendations -Vascular surgery following, appreciate recommendations -Hemoglobin A1c 7.2, lipid panel shows LDL of 81 -Continue aspirin 81 mg -Continue clopidogrel 75 mg -Continue statin -Per neurology recommendation is a 30-day event monitor outpatient -Physical therapy is recommending inpatient rehab, pending evaluation by [facility] inpatient rehab  #)  type 2 diabetes: - Sliding scale insulin, AC at bedtime - Hold home sitagliptin 50 mill grams daily -Hold home metformin thousand mill grams twice daily  #) seizure disorder: - Continue levetiracetam 500 mg twice daily  #) Hyperlipidemia: -Continue statin  #)  BPH/UTI: -Patient completed a total of 5 days for E. coli UTI -Continue tamsulosin 0.4 mg daily -Continue finasteride  5 mg daily  Fluids: Tolerating p.o. Elect lites: Monitor and supplement  nutrition: Carb restricted diet  Prophylaxis: Enoxaparin  Disposition: Pending evaluation by inpatient rehab  Full code    Consultants:   Vascular surgery  Neurology  Procedures: 12/18/2017 echo: - Left ventricle: The cavity size was normal. There was moderate   concentric hypertrophy. Systolic function was normal. The   estimated ejection fraction was in the range of 50% to 55%. There   is akinesis of the inferoseptal myocardium. Doppler parameters   are consistent with abnormal left ventricular relaxation (grade 1   diastolic dysfunction). Doppler parameters are consistent with   high ventricular filling pressure. - Aortic valve: Trileaflet; mildly thickened, mildly calcified   leaflets. There was trivial regurgitation. - Mitral valve: There was mild regurgitation. - Left atrium: The atrium was mildly dilated. - Right ventricle: The cavity size was mildly dilated. Wall   thickness was normal. - Tricuspid valve: There was trivial regurgitation. - Pulmonary arteries: Systolic pressure was mildly increased. PA   peak pressure: 39 mm Hg (S).  Impressions:  - No cardiac source of emboli was indentified.  12/18/2016 carotid ultrasound:Final Interpretation: Right Carotid: Velocities in the right ICA are consistent with a 1-39% stenosis.  Left Carotid: Velocities in the left ICA are consistent with a 80-99% stenosis.  Vertebrals: Bilateral vertebral arteries demonstrate antegrade flow.  12/21/2017 left carotid endarterectomy  Antimicrobials:   IV ceftriaxone 12/17/2017 to 12/22/2017   Subjective: Patient reports he is doing fairly well.  He does not have any complaints at this time.  He denies any nausea, vomiting, cough, congestion, rhinorrhea.  Objective: Vitals:   12/24/17 1754 12/24/17 2039 12/25/17 0434 12/25/17 0801  BP:  128/89 (!) 156/75 (!) 142/67 (!) 156/87  Pulse: 78 70 71 70    Resp: (!) 24 (!) 25 (!) 26 (!) 21  Temp: 98.5 F (36.9 C) 98.5 F (36.9 C) 97.8 F (36.6 C) 98 F (36.7 C)  TempSrc: Oral Oral Oral Oral  SpO2: 93% 93% 99% 92%  Weight:      Height:        Intake/Output Summary (Last 24 hours) at 12/25/2017 0940 Last data filed at 12/24/2017 1630 Gross per 24 hour  Intake 720 ml  Output 500 ml  Net 220 ml   Filed Weights   12/18/17 1000  Weight: 102.1 kg    Examination:  General exam: Appears calm and comfortable  Respiratory system: Clear to auscultation. Respiratory effort normal. Cardiovascular system: Regular rate and rhythm, no murmurs a. Gastrointestinal system: Abdomen is nondistended, soft and nontender. No organomegaly or masses felt. Normal bowel sounds heard. Central nervous system: Alert and oriented.  3-4 out of 5 strength on right side on upper extremity, bilateral lower extremity strength 5 out of 5, cranial nerves II through XII intact Extremities: Trace lower extremity edema. Skin: Left neck incision site is clean dry and intact Psychiatry: Judgement and insight appear normal. Mood & affect appropriate.     Data Reviewed: I have personally reviewed following labs and imaging studies  CBC: Recent Labs  Lab 12/18/17 1026 12/22/17 0432  WBC 7.8 9.9  NEUTROABS 5.7  --   HGB 16.7 13.2  HCT 50.5 41.5  MCV 97.5 96.7  PLT 163 932   Basic Metabolic Panel: Recent Labs  Lab 12/18/17 1026 12/19/17 1233 12/22/17 0432 12/24/17 0338 12/25/17 0325  NA 139 138 140 142 141  K 5.4* 3.4* 3.7 3.3* 3.3*  CL 104 104 109 108 108  CO2 24 21* 25 23 22   GLUCOSE 233* 179* 148* 121* 119*  BUN 32* 23 27* 21 22  CREATININE 1.72* 1.50* 1.44* 1.52* 1.42*  CALCIUM 8.9 9.0 8.2* 8.6* 8.8*   GFR: Estimated Creatinine Clearance: 49.6 mL/min (A) (by C-G formula based on SCr of 1.42 mg/dL (H)). Liver Function Tests: Recent Labs  Lab 12/18/17 1026  AST 31  ALT 14  ALKPHOS 88  BILITOT 1.5*  PROT 6.7  ALBUMIN 3.8   No results  for input(s): LIPASE, AMYLASE in the last 168 hours. No results for input(s): AMMONIA in the last 168 hours. Coagulation Profile: Recent Labs  Lab 12/18/17 1026  INR 1.10   Cardiac Enzymes: No results for input(s): CKTOTAL, CKMB, CKMBINDEX, TROPONINI in the last 168 hours. BNP (last 3 results) No results for input(s): PROBNP in the last 8760 hours. HbA1C: No results for input(s): HGBA1C in the last 72 hours. CBG: Recent Labs  Lab 12/24/17 0645 12/24/17 1125 12/24/17 1646 12/24/17 2122 12/25/17 0610  GLUCAP 120* 161* 124* 135* 128*   Lipid Profile: No results for input(s): CHOL, HDL, LDLCALC, TRIG, CHOLHDL, LDLDIRECT in the last 72 hours. Thyroid Function Tests: No results for input(s): TSH, T4TOTAL, FREET4, T3FREE, THYROIDAB in the last 72 hours. Anemia Panel: No results for input(s): VITAMINB12, FOLATE, FERRITIN, TIBC, IRON, RETICCTPCT in the last 72 hours. Sepsis Labs: No results for input(s): PROCALCITON, LATICACIDVEN in the last 168 hours.  Recent Results (from the past 240 hour(s))  Urine culture     Status: Abnormal   Collection Time: 12/18/17  1:25 PM  Result Value Ref Range Status   Specimen Description URINE, RANDOM  Final   Special Requests   Final  NONE Performed at [facility], [place] [address], [place], [place] [address]    Culture >=100,000 COLONIES/mL ESCHERICHIA COLI (A)  Final   Report Status 12/20/2017 FINAL  Final   Organism ID, Bacteria ESCHERICHIA COLI (A)  Final      Susceptibility   Escherichia coli - MIC*    AMPICILLIN >=32 RESISTANT Resistant     CEFAZOLIN >=64 RESISTANT Resistant     CEFTRIAXONE <=1 SENSITIVE Sensitive     CIPROFLOXACIN >=4 RESISTANT Resistant     GENTAMICIN >=16 RESISTANT Resistant     IMIPENEM <=0.25 SENSITIVE Sensitive     NITROFURANTOIN 64 INTERMEDIATE Intermediate     TRIMETH/SULFA >=320 RESISTANT Resistant     AMPICILLIN/SULBACTAM >=32 RESISTANT Resistant     PIP/TAZO 64 INTERMEDIATE Intermediate      Extended ESBL NEGATIVE Sensitive     * >=100,000 COLONIES/mL ESCHERICHIA COLI  Surgical pcr screen     Status: None   Collection Time: 12/20/17  9:16 AM  Result Value Ref Range Status   MRSA, PCR NEGATIVE NEGATIVE Final   Staphylococcus aureus NEGATIVE NEGATIVE Final    Comment: (NOTE) The Xpert SA Assay (FDA approved for NASAL specimens in patients 20 years of age and older), is one component of a comprehensive surveillance program. It is not intended to diagnose infection nor to guide or monitor treatment. Performed at [facility], [place] [address] [place], [place] [address]          Radiology Studies: No results found.      Scheduled Meds: .  stroke: mapping our early stages of recovery book   Does not apply Once  . aspirin EC  81 mg Oral Daily  . clopidogrel  75 mg Oral Daily  . docusate sodium  100 mg Oral Daily  . finasteride  5 mg Oral Daily  . heparin  5,000 Units Subcutaneous Q8H  . Influenza vac split quadrivalent PF  0.5 mL Intramuscular Tomorrow-1000  . insulin aspart  0-5 Units Subcutaneous QHS  . insulin aspart  0-9 Units Subcutaneous TID WC  . levETIRAcetam  500 mg Oral BID  . pantoprazole  40 mg Oral Daily  . potassium chloride  40 mEq Oral Q4H  . simvastatin  40 mg Oral Daily  . tamsulosin  0.4 mg Oral QPC supper   Continuous Infusions: . sodium chloride    . sodium chloride       LOS: 5 days    Time spent: [place], MD [facility]  If 7PM-7AM, please contact night-coverage [email or web address] Password [email or web address] 12/25/2017, 9:40 AM

## 2017-12-25 NOTE — Progress Notes (Signed)
Physical Therapy Treatment Patient Details Name: [name] [name] MRN: [id number] DOB: 26-Jan-1935 Today's Date: 12/25/2017    History of Present Illness Pt is a 83 y.o. male with Right weakness and HA  with multiple small acute infarcts in the left MCA and left occipital lobe, acute lacunar infarct in right centrum semiovale, significant left ICA stenosis s/p L CEA 10/8. PMHx: recurrent UTIs, seizures, CVA, Parkinson disease, hypertension, CKD stage III, DM    PT Comments    Patient continues to demonstrate good participation and motivation. Session focused on functional strength training of RUE in gravity eliminated position with flexion/extension for simulated self feeding. Currently requiring moderate assistance for partial standing and two person maximal assistance for low pivot from bed going towards his right side. During prior session on 10/11, he required less assistance to transfer towards his left (stronger) side. Continue to recommend comprehensive inpatient rehab (CIR) for post-acute therapy needs.     Follow Up Recommendations  CIR;Supervision/Assistance - 24 hour     Equipment Recommendations  3in1 (PT)    Recommendations for Other Services Rehab consult     Precautions / Restrictions Precautions Precautions: Fall Restrictions Weight Bearing Restrictions: No    Mobility  Bed Mobility Overal bed mobility: Needs Assistance Bed Mobility: Rolling;Sidelying to Sit Rolling: Min assist Sidelying to sit: Mod assist;+2 for physical assistance;HOB elevated       General bed mobility comments: Rolled to right and required modA + 2 for trunk elevation.   Transfers Overall transfer level: Needs assistance Equipment used: 2 person hand held assist Transfers: Sit to/from [employer] Sit to Stand: Mod assist   Squat pivot transfers: Max assist;+2 physical assistance     General transfer comment: pt stood from bed with crouched posture and right knee  blocked mod assist + 1 briefly then returned to seated edge of bed. MaxA + 2 to perform low pivot towards right side with right knee blocked and multiple scoots. Cueing for hand positioning and body mechanics.   Ambulation/Gait                 Stairs             Wheelchair Mobility    Modified Rankin (Stroke Patients Only)       Balance Overall balance assessment: Needs assistance Sitting-balance support: Feet supported;Single extremity supported Sitting balance-Leahy Scale: Fair                                      Cognition Arousal/Alertness: Awake/alert Behavior During Therapy: WFL for tasks assessed/performed Overall Cognitive Status: Impaired/Different from baseline Area of Impairment: Safety/judgement;Problem solving;Following commands;Awareness                   Current Attention Level: Selective Memory: Decreased short-term memory Following Commands: Follows one step commands with increased time Safety/Judgement: Decreased awareness of safety;Decreased awareness of deficits Awareness: Emergent Problem Solving: Slow processing;Requires verbal cues;Requires tactile cues        Exercises General Exercises - Upper Extremity Elbow Flexion: 10 reps;Right;Supine;Other (comment)(in gravity eliminated position with resistance) Elbow Extension: 10 reps;Supine;Other (comment)(in gravity eliminated position with resistance)    General Comments        Pertinent Vitals/Pain Pain Assessment: No/denies pain    Home Living                      Prior Function  PT Goals (current goals can now be found in the care plan section) Acute Rehab PT Goals Patient Stated Goal: move right arm more Potential to Achieve Goals: Good Progress towards PT goals: Progressing toward goals    Frequency    Min 3X/week      PT Plan Current plan remains appropriate    Co-evaluation              AM-PAC PT "6 Clicks"  Daily Activity  Outcome Measure  Difficulty turning over in bed (including adjusting bedclothes, sheets and blankets)?: A Lot Difficulty moving from lying on back to sitting on the side of the bed? : Unable Difficulty sitting down on and standing up from a chair with arms (e.g., wheelchair, bedside commode, etc,.)?: Unable Help needed moving to and from a bed to chair (including a wheelchair)?: A Lot Help needed walking in hospital room?: Total Help needed climbing 3-5 steps with a railing? : Total 6 Click Score: 8    End of Session Equipment Utilized During Treatment: Gait belt Activity Tolerance: Patient tolerated treatment well Patient left: in chair;with call bell/phone within reach;with chair alarm set Nurse Communication: Mobility status PT Visit Diagnosis: Other symptoms and signs involving the nervous system (R29.898);Other abnormalities of gait and mobility (R26.89);Unsteadiness on feet (R26.81)     Time: 4920-1007 PT Time Calculation (min) (ACUTE ONLY): 15 min  Charges:  $Therapeutic Activity: 8-22 mins                     Ellamae Sia, PT, DPT Acute Rehabilitation Services Pager [phone] Office [phone]    Willy Eddy 12/25/2017, 5:28 PM

## 2017-12-26 LAB — GLUCOSE, CAPILLARY
GLUCOSE-CAPILLARY: 123 mg/dL — AB (ref 70–99)
GLUCOSE-CAPILLARY: 181 mg/dL — AB (ref 70–99)
Glucose-Capillary: 115 mg/dL — ABNORMAL HIGH (ref 70–99)
Glucose-Capillary: 191 mg/dL — ABNORMAL HIGH (ref 70–99)

## 2017-12-26 LAB — BASIC METABOLIC PANEL
Anion gap: 9 (ref 5–15)
BUN: 22 mg/dL (ref 8–23)
Calcium: 8.8 mg/dL — ABNORMAL LOW (ref 8.9–10.3)
Creatinine, Ser: 1.49 mg/dL — ABNORMAL HIGH (ref 0.61–1.24)
GFR calc Af Amer: 49 mL/min — ABNORMAL LOW (ref >60)
GFR calc non Af Amer: 43 mL/min — ABNORMAL LOW (ref >60)
Glucose, Bld: 134 mg/dL — ABNORMAL HIGH (ref 70–99)

## 2017-12-26 LAB — BASIC METABOLIC PANEL WITH GFR
CO2: 24 mmol/L (ref 22–32)
Chloride: 109 mmol/L (ref 98–111)
Potassium: 3.6 mmol/L (ref 3.5–5.1)
Sodium: 142 mmol/L (ref 135–145)

## 2017-12-26 NOTE — Progress Notes (Signed)
PROGRESS NOTE    [name] [name]  GUY:[id number] DOB: 04-25-1934 DOA: 12/18/2017 PCP: Clovia Cuff, MD   Brief Narrative:  83 year old with past medical history relevant for BPH, type 2 diabetes on oral hypoglycemics, hyperlipidemia, seizure disorder, Parkinson's disease, prior CVA admitted with right-sided weakness and found to have multiple small acute infarcts in the left MCA and left occipital lobe, acute lacunar infarct in right centrum semiovale as well as 98% left carotid stenosis status post left carotid endarterectomy on 12/21/2017.   Assessment & Plan:   Principal Problem:   Right sided weakness Active Problems:   Hyperkalemia   Acute renal failure (HCC)   HTN (hypertension)   Diabetes mellitus, type 2 (HCC)   Hyperlipidemia associated with type 2 diabetes mellitus (HCC)   Seizures (HCC)   UTI (urinary tract infection)   Cerebral embolism with cerebral infarction   CVA (cerebral vascular accident) ([place])   Acute ischemic stroke ([place])   History of CVA (cerebrovascular accident)   Parkinson disease ([place])   Diastolic dysfunction   Hypokalemia   #) CVA status post left carotid endarterectomy on 12/21/17: -Neurology following, appreciate recommendations -Vascular surgery following, appreciate recommendations -Hemoglobin A1c 7.2, lipid panel shows LDL of 81 -Continue aspirin 81 mg -Continue clopidogrel 75 mg -Continue statin -Per neurology recommendation is a 30-day event monitor outpatient -Physical therapy is recommending inpatient rehab, pending evaluation by [facility] inpatient rehab  #)  type 2 diabetes: - Sliding scale insulin, AC at bedtime - Hold home sitagliptin 50 mill grams daily -Hold home metformin thousand mill grams twice daily  #) seizure disorder: - Continue levetiracetam 500 mg twice daily  #) Hyperlipidemia: -Continue statin  #)  BPH/UTI: -Patient completed a total of 5 days for E. coli UTI -Continue tamsulosin 0.4 mg daily -Continue finasteride  5 mg daily  Fluids: Tolerating p.o. Elect lites: Monitor and supplement  nutrition: Carb restricted diet  Prophylaxis: Enoxaparin  Disposition: Pending evaluation by inpatient rehab  Full code    Consultants:   Vascular surgery  Neurology  Procedures: 12/18/2017 echo: - Left ventricle: The cavity size was normal. There was moderate   concentric hypertrophy. Systolic function was normal. The   estimated ejection fraction was in the range of 50% to 55%. There   is akinesis of the inferoseptal myocardium. Doppler parameters   are consistent with abnormal left ventricular relaxation (grade 1   diastolic dysfunction). Doppler parameters are consistent with   high ventricular filling pressure. - Aortic valve: Trileaflet; mildly thickened, mildly calcified   leaflets. There was trivial regurgitation. - Mitral valve: There was mild regurgitation. - Left atrium: The atrium was mildly dilated. - Right ventricle: The cavity size was mildly dilated. Wall   thickness was normal. - Tricuspid valve: There was trivial regurgitation. - Pulmonary arteries: Systolic pressure was mildly increased. PA   peak pressure: 39 mm Hg (S).  Impressions:  - No cardiac source of emboli was indentified.  12/18/2016 carotid ultrasound:Final Interpretation: Right Carotid: Velocities in the right ICA are consistent with a 1-39% stenosis.  Left Carotid: Velocities in the left ICA are consistent with a 80-99% stenosis.  Vertebrals: Bilateral vertebral arteries demonstrate antegrade flow.  12/21/2017 left carotid endarterectomy  Antimicrobials:   IV ceftriaxone 12/17/2017 to 12/22/2017   Subjective: Patient sleeping in bed and does not have any complaints.  He denies any nausea, vomiting, diarrhea, cough, congestion.  Objective: Vitals:   12/25/17 0801 12/25/17 1143 12/25/17 1606 12/26/17 0342  BP: (!) 156/87 (!) 169/91 (!) 157/55 Marland Kitchen)  115/58  Pulse: 70 75  76  Resp: (!) 21 (!) 21 16 18     Temp: 98 F (36.7 C) 98 F (36.7 C) 98 F (36.7 C) 98.4 F (36.9 C)  TempSrc: Oral Oral Oral Oral  SpO2: 92% 97%  95%  Weight:      Height:        Intake/Output Summary (Last 24 hours) at 12/26/2017 0912 Last data filed at 12/26/2017 0345 Gross per 24 hour  Intake 480 ml  Output 1600 ml  Net -1120 ml   Filed Weights   12/18/17 1000  Weight: 102.1 kg    Examination:  General exam: Appears calm and comfortable  Respiratory system: Clear to auscultation. Respiratory effort normal. Cardiovascular system: Regular rate and rhythm, no murmurs a. Gastrointestinal system: Abdomen is nondistended, soft and nontender. No organomegaly or masses felt. Normal bowel sounds heard. Central nervous system: Alert and oriented.  3-4 out of 5 strength on right side on upper extremity, bilateral lower extremity strength 5 out of 5, cranial nerves II through XII intact Extremities: Trace lower extremity edema. Skin: Left neck incision site is clean dry and intact Psychiatry: Judgement and insight appear normal. Mood & affect appropriate.     Data Reviewed: I have personally reviewed following labs and imaging studies  CBC: Recent Labs  Lab 12/22/17 0432  WBC 9.9  HGB 13.2  HCT 41.5  MCV 96.7  PLT 347   Basic Metabolic Panel: Recent Labs  Lab 12/19/17 1233 12/22/17 0432 12/24/17 0338 12/25/17 0325 12/26/17 0241  NA 138 140 142 141 142  K 3.4* 3.7 3.3* 3.3* 3.6  CL 104 109 108 108 109  CO2 21* 25 23 22 24   GLUCOSE 179* 148* 121* 119* 134*  BUN 23 27* 21 22 22   CREATININE 1.50* 1.44* 1.52* 1.42* 1.49*  CALCIUM 9.0 8.2* 8.6* 8.8* 8.8*   GFR: Estimated Creatinine Clearance: 47.3 mL/min (A) (by C-G formula based on SCr of 1.49 mg/dL (H)). Liver Function Tests: No results for input(s): AST, ALT, ALKPHOS, BILITOT, PROT, ALBUMIN in the last 168 hours. No results for input(s): LIPASE, AMYLASE in the last 168 hours. No results for input(s): AMMONIA in the last 168  hours. Coagulation Profile: No results for input(s): INR, PROTIME in the last 168 hours. Cardiac Enzymes: No results for input(s): CKTOTAL, CKMB, CKMBINDEX, TROPONINI in the last 168 hours. BNP (last 3 results) No results for input(s): PROBNP in the last 8760 hours. HbA1C: No results for input(s): HGBA1C in the last 72 hours. CBG: Recent Labs  Lab 12/25/17 0610 12/25/17 1140 12/25/17 1604 12/25/17 2109 12/26/17 0638  GLUCAP 128* 118* 168* 151* 123*   Lipid Profile: No results for input(s): CHOL, HDL, LDLCALC, TRIG, CHOLHDL, LDLDIRECT in the last 72 hours. Thyroid Function Tests: No results for input(s): TSH, T4TOTAL, FREET4, T3FREE, THYROIDAB in the last 72 hours. Anemia Panel: No results for input(s): VITAMINB12, FOLATE, FERRITIN, TIBC, IRON, RETICCTPCT in the last 72 hours. Sepsis Labs: No results for input(s): PROCALCITON, LATICACIDVEN in the last 168 hours.  Recent Results (from the past 240 hour(s))  Urine culture     Status: Abnormal   Collection Time: 12/18/17  1:25 PM  Result Value Ref Range Status   Specimen Description URINE, RANDOM  Final   Special Requests   Final    NONE Performed at [facility], [address], [place], [place] [address]    Culture >=100,000 COLONIES/mL ESCHERICHIA COLI (A)  Final   Report Status 12/20/2017 FINAL  Final   Organism ID, Bacteria ESCHERICHIA COLI (A)  Final      Susceptibility   Escherichia coli - MIC*    AMPICILLIN >=32 RESISTANT Resistant     CEFAZOLIN >=64 RESISTANT Resistant     CEFTRIAXONE <=1 SENSITIVE Sensitive     CIPROFLOXACIN >=4 RESISTANT Resistant     GENTAMICIN >=16 RESISTANT Resistant     IMIPENEM <=0.25 SENSITIVE Sensitive     NITROFURANTOIN 64 INTERMEDIATE Intermediate     TRIMETH/SULFA >=320 RESISTANT Resistant     AMPICILLIN/SULBACTAM >=32 RESISTANT Resistant     PIP/TAZO 64 INTERMEDIATE Intermediate     Extended ESBL NEGATIVE Sensitive     * >=100,000 COLONIES/mL ESCHERICHIA COLI  Surgical pcr  screen     Status: None   Collection Time: 12/20/17  9:16 AM  Result Value Ref Range Status   MRSA, PCR NEGATIVE NEGATIVE Final   Staphylococcus aureus NEGATIVE NEGATIVE Final    Comment: (NOTE) The Xpert SA Assay (FDA approved for NASAL specimens in patients 50 years of age and older), is one component of a comprehensive surveillance program. It is not intended to diagnose infection nor to guide or monitor treatment. Performed at [facility], [place] [address] [place], [place] [address]          Radiology Studies: No results found.      Scheduled Meds: .  stroke: mapping our early stages of recovery book   Does not apply Once  . aspirin EC  81 mg Oral Daily  . clopidogrel  75 mg Oral Daily  . docusate sodium  100 mg Oral Daily  . finasteride  5 mg Oral Daily  . heparin  5,000 Units Subcutaneous Q8H  . Influenza vac split quadrivalent PF  0.5 mL Intramuscular Tomorrow-1000  . insulin aspart  0-5 Units Subcutaneous QHS  . insulin aspart  0-9 Units Subcutaneous TID WC  . levETIRAcetam  500 mg Oral BID  . pantoprazole  40 mg Oral Daily  . simvastatin  40 mg Oral Daily  . tamsulosin  0.4 mg Oral QPC supper   Continuous Infusions: . sodium chloride    . sodium chloride       LOS: 6 days    Time spent: [place], MD [facility]  If 7PM-7AM, please contact night-coverage [email or web address] Password [email or web address] 12/26/2017, 9:12 AM

## 2017-12-27 LAB — GLUCOSE, CAPILLARY
GLUCOSE-CAPILLARY: 126 mg/dL — AB (ref 70–99)
GLUCOSE-CAPILLARY: 128 mg/dL — AB (ref 70–99)
GLUCOSE-CAPILLARY: 160 mg/dL — AB (ref 70–99)
Glucose-Capillary: 154 mg/dL — ABNORMAL HIGH (ref 70–99)

## 2017-12-27 NOTE — Progress Notes (Addendum)
Physical Therapy Treatment Patient Details Name: [name] [name] MRN: [id number] DOB: Jan 26, 1935 Today's Date: 12/27/2017    History of Present Illness Pt is a 83 y.o. male with Right weakness and HA  with multiple small acute infarcts in the left MCA and left occipital lobe, acute lacunar infarct in right centrum semiovale, significant left ICA stenosis s/p L CEA 10/8. PMHx: recurrent UTIs, seizures, CVA, Parkinson disease, hypertension, CKD stage III, DM    PT Comments    Pt making steady progress. Remains motivated to incr independence.    Follow Up Recommendations  CIR;Supervision/Assistance - 24 hour     Equipment Recommendations  3in1 (PT)    Recommendations for Other Services Rehab consult     Precautions / Restrictions Precautions Precautions: Fall Restrictions Weight Bearing Restrictions: No    Mobility  Bed Mobility Overal bed mobility: Needs Assistance Bed Mobility: Rolling;Sidelying to Sit Rolling: Min assist Sidelying to sit: Mod assist;HOB elevated       General bed mobility comments: Assist to bring legs off of bed, elevate trunk into sitting, and bring hips to EOB.  Transfers Overall transfer level: Needs assistance Equipment used: Ambulation equipment used Transfers: Sit to/from Stand Sit to Stand: Mod assist;Min assist         General transfer comment: Assist to bring hips up and for balance. Mod assist from low surface and min assist from elevated surface. Used stedy to pivot to chair.  Ambulation/Gait                 Stairs             Wheelchair Mobility    Modified Rankin (Stroke Patients Only) Modified Rankin (Stroke Patients Only) Pre-Morbid Rankin Score: Moderate disability Modified Rankin: Severe disability     Balance Overall balance assessment: Needs assistance Sitting-balance support: No upper extremity supported;Feet unsupported Sitting balance-Leahy Scale: Fair     Standing balance support: Bilateral  upper extremity supported Standing balance-Leahy Scale: Poor Standing balance comment: stood with stedy with min assist with static standing. Stood x 3 for 30-60 sec working on hip and trunk extension                            Cognition Arousal/Alertness: Awake/alert Behavior During Therapy: WFL for tasks assessed/performed Overall Cognitive Status: Impaired/Different from baseline Area of Impairment: Safety/judgement;Problem solving;Following commands;Awareness                   Current Attention Level: Selective Memory: Decreased short-term memory Following Commands: Follows one step commands with increased time;Follows one step commands consistently Safety/Judgement: Decreased awareness of safety;Decreased awareness of deficits Awareness: Emergent Problem Solving: Slow processing;Requires verbal cues;Requires tactile cues        Exercises      General Comments        Pertinent Vitals/Pain      Home Living                      Prior Function            PT Goals (current goals can now be found in the care plan section) Progress towards PT goals: Progressing toward goals    Frequency    Min 3X/week      PT Plan Current plan remains appropriate    Co-evaluation              AM-PAC PT "6 Clicks" Daily Activity  Outcome Measure  Difficulty turning over in bed (including adjusting bedclothes, sheets and blankets)?: Unable Difficulty moving from lying on back to sitting on the side of the bed? : Unable Difficulty sitting down on and standing up from a chair with arms (e.g., wheelchair, bedside commode, etc,.)?: Unable Help needed moving to and from a bed to chair (including a wheelchair)?: A Lot Help needed walking in hospital room?: Total Help needed climbing 3-5 steps with a railing? : Total 6 Click Score: 7    End of Session Equipment Utilized During Treatment: Gait belt Activity Tolerance: Patient tolerated treatment  well Patient left: in chair;with call bell/phone within reach;with chair alarm set Nurse Communication: Mobility status;Need for lift equipment PT Visit Diagnosis: Other symptoms and signs involving the nervous system (R29.898);Other abnormalities of gait and mobility (R26.89);Unsteadiness on feet (R26.81)     Time: 0881-1031 PT Time Calculation (min) (ACUTE ONLY): 20 min  Charges:  $Therapeutic Activity: 8-22 mins                     [place] Pager [phone] Office [place] 12/27/2017, 3:38 PM

## 2017-12-27 NOTE — Progress Notes (Signed)
  Progress Note    12/27/2017 7:28 AM 6 Days Post-Op  Subjective:  No complaints; says they are getting him up out of bed but not enough.    Afebrile HR 70's-90's NSR 397'Q-734'L systolic 93% RA  Vitals:   12/26/17 1928 12/27/17 0323  BP: 126/85 134/82  Pulse: 71 88  Resp: (!) 22 16  Temp: 98.1 F (36.7 C) 98.4 F (36.9 C)  SpO2: 97% 97%    Physical Exam: General:  No distress Lungs:  Non labored Incisions:  Clean and dry Extremities:  Right hand grip strong but slightly less than the left   CBC    Component Value Date/Time   WBC 9.9 12/22/2017 0432   RBC 4.29 12/22/2017 0432   HGB 13.2 12/22/2017 0432   HCT 41.5 12/22/2017 0432   PLT 171 12/22/2017 0432   MCV 96.7 12/22/2017 0432   MCH 30.8 12/22/2017 0432   MCHC 31.8 12/22/2017 0432   RDW 12.8 12/22/2017 0432   LYMPHSABS 1.2 12/18/2017 1026   MONOABS 0.5 12/18/2017 1026   EOSABS 0.3 12/18/2017 1026   BASOSABS 0.1 12/18/2017 1026    BMET    Component Value Date/Time   NA 142 12/26/2017 0241   NA 142 09/06/2015 1003   K 3.6 12/26/2017 0241   CL 109 12/26/2017 0241   CO2 24 12/26/2017 0241   GLUCOSE 134 (H) 12/26/2017 0241   BUN 22 12/26/2017 0241   BUN 25 09/06/2015 1003   CREATININE 1.49 (H) 12/26/2017 0241   CALCIUM 8.8 (L) 12/26/2017 0241   GFRNONAA 43 (L) 12/26/2017 0241   GFRAA 49 (L) 12/26/2017 0241    INR    Component Value Date/Time   INR 1.10 12/18/2017 1026     Intake/Output Summary (Last 24 hours) at 12/27/2017 0728 Last data filed at 12/27/2017 0324 Gross per 24 hour  Intake 720 ml  Output 1275 ml  Net -555 ml     Assessment:  83 y.o. male is s/p:  left CEA   6 Days Post-Op  Plan: -pt is doing well this morning -neuro at baseline -dual antiplatelet therapy for 3 weeks with asa and Plavix and then just Plavix.  Continue statin.  -pt being evaluated for CIR as he is not able to go to SNF -f/u with Dr. Scot [name] in 2-3 weeks. (January 19, 2018 @ 1230)   [name] [name], [facility] [phone] 12/27/2017 7:28 AM

## 2017-12-27 NOTE — H&P (Signed)
Physical Medicine and Rehabilitation Admission H&P    Chief Complaint  Patient presents with  .  Stroke with functional deficits    HPI: [name] [name] is an 83 year old RH-male with history of seizure disorder, T2DM, LUE tremors, recurrent UTIs, questionable TIA event last year; who was admitted on 12/18/2017 with right-sided weakness, dizziness and numbness.  MRI/MRA brain showed multiple small acute infarct in left MCA territory and left occipital lobe, acute lacunar infarct right centrum semiovale and moderate left P2 and possible left A1 stenosis.  MRI cervical spine showed moderate DDD most severe C5/C6 with neural foraminal stenosis.  2D echo showed EF of 50 to 55% with moderate concentric hypertrophy, akinesis of input septal myocardium and no SOE.  Carotid Dopplers done revealing left 80 to 99% ICA stenosis.   Dr. Doren Custard consulted for input and recommended left carotid endarterectomy to reduce risk of further strokes. Dr Annette Stable consulted for input and did not feel that surgical intervention needed at this time.  He underwent left CEA on 12/21/2017 and to continue DAPT for 3 weeks then Plavix alone.  Patient with deficits in mobility and self-care tasks.  CIR recommended for follow-up therapy   Review of Systems  Constitutional: Negative for chills and fever.  HENT: Negative for hearing loss and tinnitus.   Eyes: Negative for blurred vision and double vision.  Respiratory: Negative for cough and shortness of breath.   Cardiovascular: Positive for leg swelling. Negative for chest pain and palpitations.  Gastrointestinal: Negative for abdominal pain, constipation and heartburn.  Genitourinary: Negative for dysuria and urgency.  Musculoskeletal: Negative for myalgias and neck pain.  Neurological: Positive for tremors (resting tremor LUE). Negative for dizziness and headaches.  Psychiatric/Behavioral: Negative for memory loss. The patient is not nervous/anxious.     Past Medical  History:  Diagnosis Date  . Arthritis   . Diabetes mellitus without complication ([place])    type 2  . Prostate disease   . Renal calculi   . Renal disorder    kidney stones  . Seizures ([place])     Past Surgical History:  Procedure Laterality Date  . CYSTOSCOPY W/ URETERAL STENT PLACEMENT Bilateral 08/10/2015   Procedure: CYSTOSCOPY WITH BILATERAL RETROGRADE PYELOGRAM/BILATERAL URETERAL STENT PLACEMENT;  Surgeon: Kathie Rhodes, MD;  Location: [facility];  Service: Urology;  Laterality: Bilateral;  . CYSTOSCOPY WITH RETROGRADE PYELOGRAM, URETEROSCOPY AND STENT PLACEMENT Bilateral 09/02/2015   Procedure: CYSTOSCOPY BILATERAL STENT REMOVAL BILATERAL RETROGRADE PYELOGRAM, BILATERAL URETEROSCOPY AND BILATERAL STENT PLACEMENT;  Surgeon: Kathie Rhodes, MD;  Location: [facility];  Service: Urology;  Laterality: Bilateral;  . ENDARTERECTOMY Left 12/21/2017   Procedure: Left ENDARTERECTOMY CAROTID;  Surgeon: Angelia Mould, MD;  Location: [facility];  Service: Vascular;  Laterality: Left;  . HOLMIUM LASER APPLICATION Bilateral 6/96/7893   Procedure: BILATERAL HOLMIUM LASER  LITHO ;  Surgeon: Kathie Rhodes, MD;  Location: [facility];  Service: Urology;  Laterality: Bilateral;  . JOINT REPLACEMENT Right 2007   hip, Dr. Alvan Dame  . PATCH ANGIOPLASTY Left 12/21/2017   Procedure: PATCH ANGIOPLASTY;  Surgeon: Angelia Mould, MD;  [facility];  Service: Vascular;  Laterality: Left;  . TONSILLECTOMY Bilateral    70 years ago    Family History  Problem Relation Age of Onset  . Early death Neg Hx   . Kidney disease Neg Hx     Social History: Married. Wife is blind but independent. He was independent PTA. He  reports that he has never smoked. He  has never used smokeless tobacco. He reports that he does not drink alcohol or use drugs.    Allergies: No Known Allergies    Medications Prior to Admission  Medication Sig Dispense Refill  . aspirin 81 MG tablet Take 81 mg by mouth daily.     . finasteride  (PROSCAR) 5 MG tablet Take 5 mg by mouth daily.    Marland Kitchen ibuprofen (ADVIL,MOTRIN) 200 MG tablet Take 200 mg by mouth every 6 (six) hours as needed for headache or mild pain.    Marland Kitchen levETIRAcetam (KEPPRA) 500 MG tablet Take 1 tablet (500 mg total) by mouth 2 (two) times daily. 60 tablet 0  . metFORMIN (GLUCOPHAGE) 1000 MG tablet Take 1 tablet (1,000 mg total) by mouth 2 (two) times daily with a meal. 15 tablet 0  . simvastatin (ZOCOR) 20 MG tablet Take 20 mg by mouth daily.     . tamsulosin (FLOMAX) 0.4 MG CAPS capsule Take 1 capsule (0.4 mg total) by mouth daily after supper. (Patient taking differently: Take 0.4 mg by mouth daily. ) 30 capsule 0  . sitaGLIPtin (JANUVIA) 50 MG tablet Take 1 tablet (50 mg total) by mouth daily. (Patient not taking: Reported on 12/18/2017) 10 tablet 0    Drug Regimen Review  Drug regimen was reviewed and remains appropriate with no significant issues identified  Home: Home Living Family/patient expects to be discharged to:: Private residence Living Arrangements: Spouse/significant other, Children Available Help at Discharge: Family Type of Home: House Home Access: Stairs to enter Technical brewer of Steps: 7 Entrance Stairs-Rails: Can reach both Home Layout: One level Bathroom Shower/Tub: Tub/shower unit, Architectural technologist: Standard Home Equipment: Environmental consultant - 2 wheels, Environmental consultant - 4 wheels, [place] - single point, Bedside commode, Shower seat, Grab bars - tub/shower  Lives With: Spouse   Functional History: Prior Function Level of Independence: Needs assistance Gait / Transfers Assistance Needed: ambulated with cane ADL's / Homemaking Assistance Needed: linda (wife) helps with the showering, cooks but patient reports self dresses(reports linda completes all bathing, pt holding onto grabbar)  Functional Status:  Mobility: Bed Mobility Overal bed mobility: Needs Assistance Bed Mobility: Supine to Sit Rolling: Min assist Sidelying to sit: Mod assist,  HOB elevated Supine to sit: Mod assist, +2 for physical assistance, HOB elevated General bed mobility comments: increased time and effort Transfers Overall transfer level: Needs assistance Equipment used: Rolling walker (2 wheeled) Transfer via Lift Equipment: Stedy Transfers: Sit to/from Stand Sit to Stand: Mod assist, +2 physical assistance, From elevated surface Squat pivot transfers: Max assist, +2 physical assistance General transfer comment: stedy used for transfer to chair Ambulation/Gait Ambulation/Gait assistance: +2 physical assistance, Mod assist Gait Distance (Feet): 2 Feet Assistive device: Rolling walker (2 wheeled) Gait Pattern/deviations: Step-to pattern, Decreased step length - right, Decreased step length - left, Shuffle, Wide base of support General Gait Details: Assist for balance and support. Pt with bil knees in flexion. Rt lateral lean. Assist with movement and stability of walker. Gait velocity: decr Gait velocity interpretation: <1.31 ft/sec, indicative of household ambulator    ADL: ADL Overall ADL's : Needs assistance/impaired Eating/Feeding: Set up, Bed level Eating/Feeding Details (indicate cue type and reason): used L hand; decreased motor control R Grooming: Wash/dry hands, Wash/dry face, Brushing hair, Sitting, Set up Grooming Details (indicate cue type and reason): primary with use of LUE Upper Body Bathing: Minimal assistance, Bed level(brought LUE over; cannot reach under arm) Lower Body Bathing: Maximal assistance, Sit to/from stand Upper Body Dressing : Moderate  assistance, Sitting Lower Body Dressing: Total assistance Lower Body Dressing Details (indicate cue type and reason): for socks with +2 Mod A sit>stand (for safety) from bed (increased time), forward stooped posture Toilet Transfer: Maximal assistance, +2 for physical assistance Toilet Transfer Details (indicate cue type and reason): ambulate 5 feet to sit in recliner behind him; heavy  lean to right with tendency for stooped posture Toileting- Clothing Manipulation and Hygiene: Minimal assistance, Sit to/from stand General ADL Comments: used stedy with RN assisting to get to chair; performed grooming and RUE strengthening  Cognition: Cognition Overall Cognitive Status: Within Functional Limits for tasks assessed Arousal/Alertness: Awake/alert Orientation Level: Oriented X4 Attention: Sustained Sustained Attention: Appears intact Memory: Impaired Memory Impairment: Retrieval deficit, Decreased short term memory Decreased Short Term Memory: Verbal basic Awareness: Impaired Awareness Impairment: Intellectual impairment Cognition Arousal/Alertness: Awake/alert Behavior During Therapy: WFL for tasks assessed/performed Overall Cognitive Status: Within Functional Limits for tasks assessed Area of Impairment: Safety/judgement, Problem solving, Following commands, Awareness Current Attention Level: Selective Memory: Decreased short-term memory Following Commands: Follows one step commands with increased time, Follows one step commands consistently Safety/Judgement: Decreased awareness of safety, Decreased awareness of deficits Awareness: Emergent Problem Solving: Slow processing, Requires verbal cues, Requires tactile cues General Comments: wfl for this session.  Pt followed all one step commands, kept attention to task in minimally distracting environment. Asking about CIR and therapy requirements   Blood pressure (!) 143/70, pulse 72, temperature 98.2 F (36.8 C), temperature source Oral, resp. rate 18, height 5\' 10"  (1.778 m), weight 102.1 kg, SpO2 93 %. Physical Exam  Nursing note and vitals reviewed. Constitutional: He is oriented to person, place, and time. No distress.  HENT:  Head: Normocephalic.  Eyes: Pupils are equal, round, and reactive to light.  Neck: Normal range of motion.  Left neck incision C/D/I with min edema.   Cardiovascular: Normal rate. Exam  reveals no friction rub.  Murmur heard. Respiratory: Effort normal. No respiratory distress. He has no wheezes. He has no rales.  GI: Soft. He exhibits no distension. There is no tenderness.  Musculoskeletal: He exhibits edema (min edema bilateral hands and feet.).  Neurological: He is alert and oriented to person, place, and time. He displays normal reflexes. No cranial nerve deficit. Coordination abnormal.  Speech clear. Able to follow basic commands without difficulty. RUE 3/5 prox to 4-/5 distally. LUE 4+/5. RLE 3+ to 4/5 prox to distal. LLE 4+/5. Appears to sense LT in all 4's. Reasonable insight and awareness  Skin: Skin is warm and dry. He is not diaphoretic.  Psychiatric: He has a normal mood and affect. His behavior is normal.    Results for orders placed or performed during the hospital encounter of 12/18/17 (from the past 48 hour(s))  Glucose, capillary     Status: Abnormal   Collection Time: 12/28/17  4:18 PM  Result Value Ref Range   Glucose-Capillary 151 (H) 70 - 99 mg/dL   Comment 1 Notify RN   Glucose, capillary     Status: Abnormal   Collection Time: 12/28/17  8:49 PM  Result Value Ref Range   Glucose-Capillary 169 (H) 70 - 99 mg/dL  Glucose, capillary     Status: Abnormal   Collection Time: 12/29/17  5:38 AM  Result Value Ref Range   Glucose-Capillary 143 (H) 70 - 99 mg/dL  Glucose, capillary     Status: Abnormal   Collection Time: 12/29/17 11:17 AM  Result Value Ref Range   Glucose-Capillary 165 (H) 70 - 99 mg/dL  Comment 1 Notify RN    Comment 2 Document in Chart   Glucose, capillary     Status: Abnormal   Collection Time: 12/29/17  4:00 PM  Result Value Ref Range   Glucose-Capillary 175 (H) 70 - 99 mg/dL   Comment 1 Notify RN    Comment 2 Document in Chart   Glucose, capillary     Status: Abnormal   Collection Time: 12/29/17  9:20 PM  Result Value Ref Range   Glucose-Capillary 167 (H) 70 - 99 mg/dL   Comment 1 Notify RN    Comment 2 Document in Chart     Glucose, capillary     Status: Abnormal   Collection Time: 12/30/17  6:19 AM  Result Value Ref Range   Glucose-Capillary 133 (H) 70 - 99 mg/dL   Comment 1 Notify RN    Comment 2 Document in Chart   Glucose, capillary     Status: Abnormal   Collection Time: 12/30/17 11:28 AM  Result Value Ref Range   Glucose-Capillary 153 (H) 70 - 99 mg/dL   Comment 1 Notify RN    No results found.     Medical Problem List and Plan: 1.  Right HP and functional deficits secondary to left > right bi-cerebral infarcts  -admit to inpatient rehab 2.  DVT Prophylaxis/Anticoagulation: Pharmaceutical: Heparin 3. Pain Management: N/A 4. Mood: LCSW to follow for evaluation and support.  5. Neuropsych: This patient is capable of making decisions on his own behalf. 6. Skin/Wound Care: Monitor wound daily for healing.  Maintain adequate nutritional and hydration status  7. Fluids/Electrolytes/Nutrition: Monitor I's and O's.  Check labs in a.m. 8.  S/P Left CEA: DAPT x3 weeks followed by Plavix alone.  Continue statin 9.  CKD: Continue to monitor with serial checks. Encourage fluid intake.   10.  Seizure disorder: Stable on Keppra twice daily 11.  T2DM: Hgb Aic- 7.2.  Was on metformin and Januvia at home prior to [employer].    -Continue metformin due to baseline serum creatinine 1.7  - consider Amaryl..    -Will continue to monitor blood sugars AC/HS basis.  Use sliding scale insulin for tighter blood sugar control. 12.  BPH: Continue Flomax and Proscar. 13.  E coli UTI: Completed treatment 10/10  -dc condom cath, voiding trial.    Post Admission Physician Evaluation: 1. Functional deficits secondary  to bi-cerebral infarcts. 2. Patient is admitted to receive collaborative, interdisciplinary care between the physiatrist, rehab nursing staff, and therapy team. 3. Patient's level of medical complexity and substantial therapy needs in context of that medical necessity cannot be provided at a  lesser intensity of care such as a SNF. 4. Patient has experienced substantial functional loss from his/her baseline which was documented above under the "Functional History" and "Functional Status" headings.  Judging by the patient's diagnosis, physical exam, and functional history, the patient has potential for functional progress which will result in measurable gains while on inpatient rehab.  These gains will be of substantial and practical use upon discharge  in facilitating mobility and self-care at the household level. 5. Physiatrist will provide 24 hour management of medical needs as well as oversight of the therapy plan/treatment and provide guidance as appropriate regarding the interaction of the two. 6. The Preadmission Screening has been reviewed and patient status is unchanged unless otherwise stated above. 7. 24 hour rehab nursing will assist with bladder management, bowel management, safety, skin/wound care, disease management, medication administration and patient education  and  help integrate therapy concepts, techniques,education, etc. 8. PT will assess and treat for/with: Lower extremity strength, range of motion, stamina, balance, functional mobility, safety, adaptive techniques and equipment, NMR, home safety evaluation, ego support, community reentry.   Goals are: supervision to min assist. 9. OT will assess and treat for/with: ADL's, functional mobility, safety, upper extremity strength, adaptive techniques and equipment, NMR, community reentry, family ed.   Goals are: min assist. Therapy may proceed with showering this patient. 10. SLP will assess and treat for/with: n/a .  Goals are: n/a. 11. Case Management and Social Worker will assess and treat for psychological issues and discharge planning. 12. Team conference will be held weekly to assess progress toward goals and to determine barriers to discharge. 13. Patient will receive at least 3 hours of therapy per day at least 5 days  per week. 14. ELOS: 12-18 days       15. Prognosis:  excellent   I have personally performed a face to face diagnostic evaluation of this patient and formulated the key components of the plan.  Additionally, I have personally reviewed laboratory data, imaging studies, as well as relevant notes and concur with the physician assistant's documentation above.  [name], MD, [facility]    Bary Leriche, PA-C 12/30/2017

## 2017-12-27 NOTE — Progress Notes (Signed)
Physical Therapy Treatment Patient Details Name: [name] [name] MRN: [id number] DOB: 01/20/1935 Today's Date: 12/27/2017    History of Present Illness Pt is a 83 y.o. male with Right weakness and HA  with multiple small acute infarcts in the left MCA and left occipital lobe, acute lacunar infarct in right centrum semiovale, significant left ICA stenosis s/p L CEA 10/8. PMHx: recurrent UTIs, seizures, CVA, Parkinson disease, hypertension, CKD stage III, DM    PT Comments    Returned to attempt standing/gait with walker with second person to assist. Pt making slow, steady progress.    Follow Up Recommendations  CIR;Supervision/Assistance - 24 hour     Equipment Recommendations  3in1 (PT)    Recommendations for Other Services Rehab consult     Precautions / Restrictions Precautions Precautions: Fall Precaution Comments: L ankle monitor  Restrictions Weight Bearing Restrictions: No    Mobility  Bed Mobility     General bed mobility comments: Pt up in chair  Transfers Overall transfer level: Needs assistance Equipment used: Rolling walker (2 wheeled) Transfers: Sit to/from Stand Sit to Stand: Mod assist;+2 physical assistance         General transfer comment: Assist to bring hips up and for balance. Assist for placement of rt hand on walker. Verbal cues for hand placement.  Ambulation/Gait Ambulation/Gait assistance: +2 physical assistance;Mod assist Gait Distance (Feet): 2 Feet Assistive device: Rolling walker (2 wheeled) Gait Pattern/deviations: Step-to pattern;Decreased step length - right;Decreased step length - left;Shuffle;Wide base of support Gait velocity: decr Gait velocity interpretation: <1.31 ft/sec, indicative of household ambulator General Gait Details: Assist for balance and support. Pt with bil knees in flexion. Rt lateral lean. Assist with movement and stability of walker.   Stairs             Wheelchair Mobility    Modified Rankin  (Stroke Patients Only) Modified Rankin (Stroke Patients Only) Pre-Morbid Rankin Score: Moderate disability Modified Rankin: Severe disability     Balance Overall balance assessment: Needs assistance Sitting-balance support: Feet supported;No upper extremity supported Sitting balance-Leahy Scale: Fair     Standing balance support: Bilateral upper extremity supported Standing balance-Leahy Scale: Poor Standing balance comment: walker and mod assist for static standing                            Cognition Arousal/Alertness: Awake/alert Behavior During Therapy: WFL for tasks assessed/performed Overall Cognitive Status: Impaired/Different from baseline Area of Impairment: Safety/judgement;Problem solving;Following commands;Awareness                   Current Attention Level: Selective Memory: Decreased short-term memory Following Commands: Follows one step commands with increased time;Follows one step commands consistently Safety/Judgement: Decreased awareness of safety;Decreased awareness of deficits Awareness: Emergent Problem Solving: Slow processing;Requires verbal cues;Requires tactile cues        Exercises General Exercises - Upper Extremity Shoulder Flexion: (also horizontal abd and IR/ER, gravity elminated)    General Comments        Pertinent Vitals/Pain Pain Assessment: No/denies pain    Home Living                      Prior Function            PT Goals (current goals can now be found in the care plan section) Progress towards PT goals: Progressing toward goals    Frequency    Min 3X/week      PT  Plan Current plan remains appropriate    Co-evaluation              AM-PAC PT "6 Clicks" Daily Activity  Outcome Measure  Difficulty turning over in bed (including adjusting bedclothes, sheets and blankets)?: Unable Difficulty moving from lying on back to sitting on the side of the bed? : Unable Difficulty sitting  down on and standing up from a chair with arms (e.g., wheelchair, bedside commode, etc,.)?: Unable Help needed moving to and from a bed to chair (including a wheelchair)?: A Lot Help needed walking in hospital room?: Total Help needed climbing 3-5 steps with a railing? : Total 6 Click Score: 7    End of Session Equipment Utilized During Treatment: Gait belt Activity Tolerance: Patient tolerated treatment well Patient left: in chair;with call bell/phone within reach;with chair alarm set Nurse Communication: Mobility status;Need for lift equipment PT Visit Diagnosis: Other symptoms and signs involving the nervous system (R29.898);Other abnormalities of gait and mobility (R26.89);Unsteadiness on feet (R26.81)     Time: 4166-0630 PT Time Calculation (min) (ACUTE ONLY): 9 min  Charges:  $Therapeutic Activity: 8-22 mins                     [place] Pager [phone] Office [place] 12/27/2017, 3:44 PM

## 2017-12-27 NOTE — Progress Notes (Signed)
Occupational Therapy Treatment Patient Details Name: [name] [name] MRN: [id number] DOB: 1934/09/09 Today's Date: 12/27/2017    History of present illness Pt is a 83 y.o. male with Right weakness and HA  with multiple small acute infarcts in the left MCA and left occipital lobe, acute lacunar infarct in right centrum semiovale, significant left ICA stenosis s/p L CEA 10/8. PMHx: recurrent UTIs, seizures, CVA, Parkinson disease, hypertension, CKD stage III, DM   OT comments  Worked on incorporating RUE during adls, AAROM, sitting balance, and edema management. Pt very motivated And demonstrates good participation  Follow Up Recommendations  CIR    Equipment Recommendations       Recommendations for Other Services Rehab consult    Precautions / Restrictions Precautions Precautions: Fall Precaution Comments: L ankle monitor  Restrictions Weight Bearing Restrictions: No       Mobility Bed Mobility     Rolling: Min assist Sidelying to sit: Mod assist Supine to sit: Mod assist     General bed mobility comments: sat EOB x 5 minutes after grooming from bed with HOB raised.  Worked on weight bearing through R hand and releasing L hand for activity  Transfers                      Balance     Sitting balance-Leahy Scale: Fair                                     ADL either performed or assessed with clinical judgement   ADL   Eating/Feeding: Set up;Bed level Eating/Feeding Details (indicate cue type and reason): used L hand; decreased motor control R Grooming: Wash/dry hands;Wash/dry face;Oral care;Minimal assistance Grooming Details (indicate cue type and reason): used R hand for face below eyes due to decreased motor control. Able to do a small part of front of teeth with RUE, but mostly L due to motor control Upper Body Bathing: Minimal assistance;Bed level(brought LUE over; cannot reach under arm)                              General ADL Comments: performed above ADL activities from supine level     Vision       Perception     Praxis      Cognition Arousal/Alertness: Awake/alert Behavior During Therapy: WFL for tasks assessed/performed                                   General Comments: wfl for this session.  Pt followed all one step commands, kept attention to task in minimally distracting environment. Asking about CIR and therapy requirements        Exercises General Exercises - Upper Extremity Shoulder Flexion: AAROM;10 reps;Right(also horizontal abd and IR/ER, gravity elminated)   Shoulder Instructions       General Comments RUE with edema; worked on AROM and positioning up for edema management    Pertinent Vitals/ Pain       Pain Assessment: No/denies pain  Home Living                                          Prior Functioning/Environment  Frequency  Min 2X/week        Progress Toward Goals  OT Goals(current goals can now be found in the care plan section)  Progress towards OT goals: Progressing toward goals     Plan      Co-evaluation                 AM-PAC PT "6 Clicks" Daily Activity     Outcome Measure   Help from another person eating meals?: A Little Help from another person taking care of personal grooming?: A Little Help from another person toileting, which includes using toliet, bedpan, or urinal?: A Lot Help from another person bathing (including washing, rinsing, drying)?: A Lot Help from another person to put on and taking off regular upper body clothing?: A Lot Help from another person to put on and taking off regular lower body clothing?: Total 6 Click Score: 13    End of Session    OT Visit Diagnosis: Unsteadiness on feet (R26.81);Other abnormalities of gait and mobility (R26.89);Muscle weakness (generalized) (M62.81);Other symptoms and signs involving cognitive function;Hemiplegia and  hemiparesis Hemiplegia - Right/Left: Right Hemiplegia - dominant/non-dominant: Dominant   Activity Tolerance Patient tolerated treatment well   Patient Left in bed;with call bell/phone within reach;with bed alarm set   Nurse Communication          Time: 4035-2481 OT Time Calculation (min): 41 min  Charges: OT General Charges $OT Visit: 1 Visit OT Treatments $Self Care/Home Management : 23-37 mins $Therapeutic Activity: 8-22 mins  [name] [name], OTR/L Acute Rehabilitation Services [phone] [place] pager [phone] office 12/27/2017   [name] [name] 12/27/2017, 8:58 AM

## 2017-12-27 NOTE — Care Management Important Message (Signed)
Important Message  Patient Details  Name: [name] [name] MRN: [id number] Date of Birth: 11-06-1934   Medicare Important Message Given:  Yes    Tani Virgo P Kaesen Rodriguez 12/27/2017, 3:37 PM

## 2017-12-27 NOTE — Progress Notes (Signed)
PROGRESS NOTE    [name] [name]  EXB:[id number] DOB: [birth year] DOA: 12/18/2017 PCP: Clovia Cuff, MD   Brief Narrative:  83 year old with past medical history relevant for BPH, type 2 diabetes on oral hypoglycemics, hyperlipidemia, seizure disorder, Parkinson's disease, prior CVA admitted with right-sided weakness and found to have multiple small acute infarcts in the left MCA and left occipital lobe, acute lacunar infarct in right centrum semiovale as well as 98% left carotid stenosis status post left carotid endarterectomy on 12/21/2017.   Assessment & Plan:   Principal Problem:   Right sided weakness Active Problems:   Hyperkalemia   Acute renal failure (HCC)   HTN (hypertension)   Diabetes mellitus, type 2 (HCC)   Hyperlipidemia associated with type 2 diabetes mellitus (HCC)   Seizures (HCC)   UTI (urinary tract infection)   Cerebral embolism with cerebral infarction   CVA (cerebral vascular accident) ([place])   Acute ischemic stroke ([place])   History of CVA (cerebrovascular accident)   Parkinson disease ([place])   Diastolic dysfunction   Hypokalemia   #) CVA status post left carotid endarterectomy on 12/21/17: -Neurology following, appreciate recommendations -Vascular surgery following, appreciate recommendations -Hemoglobin A1c 7.2, lipid panel shows LDL of 81 -Continue aspirin 81 mg -Continue clopidogrel 75 mg -Continue statin -Per neurology recommendation is a 30-day event monitor outpatient -Physical therapy is recommending inpatient rehab, pending evaluation by [facility] inpatient rehab  #)  type 2 diabetes: - Sliding scale insulin, AC at bedtime - Hold home sitagliptin 50 mill grams daily -Hold home metformin thousand mill grams twice daily  #) seizure disorder: - Continue levetiracetam 500 mg twice daily  #) Hyperlipidemia: -Continue statin  #)  BPH/UTI: -Patient completed a total of 5 days for E. coli UTI -Continue tamsulosin 0.4 mg daily -Continue finasteride  5 mg daily  Fluids: Tolerating p.o. Elect lites: Monitor and supplement  nutrition: Carb restricted diet  Prophylaxis: Enoxaparin  Disposition: Pending evaluation by inpatient rehab  Full code    Consultants:   Vascular surgery  Neurology  Procedures: 12/18/2017 echo: - Left ventricle: The cavity size was normal. There was moderate   concentric hypertrophy. Systolic function was normal. The   estimated ejection fraction was in the range of 50% to 55%. There   is akinesis of the inferoseptal myocardium. Doppler parameters   are consistent with abnormal left ventricular relaxation (grade 1   diastolic dysfunction). Doppler parameters are consistent with   high ventricular filling pressure. - Aortic valve: Trileaflet; mildly thickened, mildly calcified   leaflets. There was trivial regurgitation. - Mitral valve: There was mild regurgitation. - Left atrium: The atrium was mildly dilated. - Right ventricle: The cavity size was mildly dilated. Wall   thickness was normal. - Tricuspid valve: There was trivial regurgitation. - Pulmonary arteries: Systolic pressure was mildly increased. PA   peak pressure: 39 mm Hg (S).  Impressions:  - No cardiac source of emboli was indentified.  12/18/2016 carotid ultrasound:Final Interpretation: Right Carotid: Velocities in the right ICA are consistent with a 1-39% stenosis.  Left Carotid: Velocities in the left ICA are consistent with a 80-99% stenosis.  Vertebrals: Bilateral vertebral arteries demonstrate antegrade flow.  12/21/2017 left carotid endarterectomy  Antimicrobials:   IV ceftriaxone 12/17/2017 to 12/22/2017   Subjective: Patient sleeping in bed and does not have any complaints.  He denies any nausea, vomiting, diarrhea, cough, congestion.  Objective: Vitals:   12/26/17 0342 12/26/17 1114 12/26/17 1928 12/27/17 0323  BP: (!) 115/58 (!) 167/95 126/85 134/82  Pulse: 76 99 71 88  Resp: 18 (!) 23 (!) 22 16  Temp:  98.4 F (36.9 C) 98 F (36.7 C) 98.1 F (36.7 C) 98.4 F (36.9 C)  TempSrc: Oral Oral Oral Oral  SpO2: 95% 93% 97% 97%  Weight:      Height:        Intake/Output Summary (Last 24 hours) at 12/27/2017 0931 Last data filed at 12/27/2017 0900 Gross per 24 hour  Intake 720 ml  Output 1275 ml  Net -555 ml   Filed Weights   12/18/17 1000  Weight: 102.1 kg    Examination:  General exam: Appears calm and comfortable  Respiratory system: Clear to auscultation. Respiratory effort normal. Cardiovascular system: Regular rate and rhythm, no murmurs a. Gastrointestinal system: Abdomen is nondistended, soft and nontender. No organomegaly or masses felt. Normal bowel sounds heard. Central nervous system: Alert and oriented.  3-4 out of 5 strength on right side on upper extremity, bilateral lower extremity strength 5 out of 5, cranial nerves II through XII intact Extremities: Trace lower extremity edema. Skin: Left neck incision site is clean dry and intact Psychiatry: Judgement and insight appear normal. Mood & affect appropriate.     Data Reviewed: I have personally reviewed following labs and imaging studies  CBC: Recent Labs  Lab 12/22/17 0432  WBC 9.9  HGB 13.2  HCT 41.5  MCV 96.7  PLT 101   Basic Metabolic Panel: Recent Labs  Lab 12/22/17 0432 12/24/17 0338 12/25/17 0325 12/26/17 0241  NA 140 142 141 142  K 3.7 3.3* 3.3* 3.6  CL 109 108 108 109  CO2 25 23 22 24   GLUCOSE 148* 121* 119* 134*  BUN 27* 21 22 22   CREATININE 1.44* 1.52* 1.42* 1.49*  CALCIUM 8.2* 8.6* 8.8* 8.8*   GFR: Estimated Creatinine Clearance: 47.3 mL/min (A) (by C-G formula based on SCr of 1.49 mg/dL (H)). Liver Function Tests: No results for input(s): AST, ALT, ALKPHOS, BILITOT, PROT, ALBUMIN in the last 168 hours. No results for input(s): LIPASE, AMYLASE in the last 168 hours. No results for input(s): AMMONIA in the last 168 hours. Coagulation Profile: No results for input(s): INR,  PROTIME in the last 168 hours. Cardiac Enzymes: No results for input(s): CKTOTAL, CKMB, CKMBINDEX, TROPONINI in the last 168 hours. BNP (last 3 results) No results for input(s): PROBNP in the last 8760 hours. HbA1C: No results for input(s): HGBA1C in the last 72 hours. CBG: Recent Labs  Lab 12/26/17 0638 12/26/17 1118 12/26/17 1642 12/26/17 2102 12/27/17 0553  GLUCAP 123* 181* 115* 191* 126*   Lipid Profile: No results for input(s): CHOL, HDL, LDLCALC, TRIG, CHOLHDL, LDLDIRECT in the last 72 hours. Thyroid Function Tests: No results for input(s): TSH, T4TOTAL, FREET4, T3FREE, THYROIDAB in the last 72 hours. Anemia Panel: No results for input(s): VITAMINB12, FOLATE, FERRITIN, TIBC, IRON, RETICCTPCT in the last 72 hours. Sepsis Labs: No results for input(s): PROCALCITON, LATICACIDVEN in the last 168 hours.  Recent Results (from the past 240 hour(s))  Urine culture     Status: Abnormal   Collection Time: 12/18/17  1:25 PM  Result Value Ref Range Status   Specimen Description URINE, RANDOM  Final   Special Requests   Final    NONE Performed at [facility], [address], [place],  [address]    Culture >=100,000 COLONIES/mL ESCHERICHIA COLI (A)  Final   Report Status 12/20/2017 FINAL  Final   Organism ID, Bacteria ESCHERICHIA COLI (A)  Final  Susceptibility   Escherichia coli - MIC*    AMPICILLIN >=32 RESISTANT Resistant     CEFAZOLIN >=64 RESISTANT Resistant     CEFTRIAXONE <=1 SENSITIVE Sensitive     CIPROFLOXACIN >=4 RESISTANT Resistant     GENTAMICIN >=16 RESISTANT Resistant     IMIPENEM <=0.25 SENSITIVE Sensitive     NITROFURANTOIN 64 INTERMEDIATE Intermediate     TRIMETH/SULFA >=320 RESISTANT Resistant     AMPICILLIN/SULBACTAM >=32 RESISTANT Resistant     PIP/TAZO 64 INTERMEDIATE Intermediate     Extended ESBL NEGATIVE Sensitive     * >=100,000 COLONIES/mL ESCHERICHIA COLI  Surgical pcr screen     Status: None   Collection Time: 12/20/17  9:16  AM  Result Value Ref Range Status   MRSA, PCR NEGATIVE NEGATIVE Final   Staphylococcus aureus NEGATIVE NEGATIVE Final    Comment: (NOTE) The Xpert SA Assay (FDA approved for NASAL specimens in patients 52 years of age and older), is one component of a comprehensive surveillance program. It is not intended to diagnose infection nor to guide or monitor treatment. Performed at [facility], [place] [address] [place], [place] [address]          Radiology Studies: No results found.      Scheduled Meds: .  stroke: mapping our early stages of recovery book   Does not apply Once  . aspirin EC  81 mg Oral Daily  . clopidogrel  75 mg Oral Daily  . docusate sodium  100 mg Oral Daily  . finasteride  5 mg Oral Daily  . heparin  5,000 Units Subcutaneous Q8H  . Influenza vac split quadrivalent PF  0.5 mL Intramuscular Tomorrow-1000  . insulin aspart  0-5 Units Subcutaneous QHS  . insulin aspart  0-9 Units Subcutaneous TID WC  . levETIRAcetam  500 mg Oral BID  . pantoprazole  40 mg Oral Daily  . simvastatin  40 mg Oral Daily  . tamsulosin  0.4 mg Oral QPC supper   Continuous Infusions: . sodium chloride    . sodium chloride       LOS: 7 days    Time spent: [place], MD [facility]  If 7PM-7AM, please contact night-coverage [email or web address] Password [email or web address] 12/27/2017, 9:31 AM

## 2017-12-28 ENCOUNTER — Encounter (HOSPITAL_COMMUNITY): Payer: Self-pay | Admitting: Physical Medicine and Rehabilitation

## 2017-12-28 LAB — CBC
HCT: 45.8 % (ref 39.0–52.0)
Hemoglobin: 15.2 g/dL (ref 13.0–17.0)
MCH: 31.9 pg (ref 26.0–34.0)
MCHC: 33.2 g/dL (ref 30.0–36.0)
MCV: 96.2 fL (ref 80.0–100.0)
Platelets: 183 K/uL (ref 150–400)
RBC: 4.76 MIL/uL (ref 4.22–5.81)
RDW: 12.7 % (ref 11.5–15.5)
WBC: 9.5 K/uL (ref 4.0–10.5)
nRBC: 0 % (ref 0.0–0.2)

## 2017-12-28 LAB — GLUCOSE, CAPILLARY
GLUCOSE-CAPILLARY: 143 mg/dL — AB (ref 70–99)
GLUCOSE-CAPILLARY: 184 mg/dL — AB (ref 70–99)
GLUCOSE-CAPILLARY: 169 mg/dL — AB (ref 70–99)
Glucose-Capillary: 151 mg/dL — ABNORMAL HIGH (ref 70–99)

## 2017-12-28 LAB — BASIC METABOLIC PANEL WITH GFR
Anion gap: 11 (ref 5–15)
BUN: 25 mg/dL — ABNORMAL HIGH (ref 8–23)
Chloride: 106 mmol/L (ref 98–111)
GFR calc Af Amer: 51 mL/min — ABNORMAL LOW (ref >60)
GFR calc non Af Amer: 44 mL/min — ABNORMAL LOW (ref >60)
Potassium: 3.6 mmol/L (ref 3.5–5.1)
Sodium: 139 mmol/L (ref 135–145)

## 2017-12-28 LAB — BASIC METABOLIC PANEL
CO2: 22 mmol/L (ref 22–32)
Calcium: 8.8 mg/dL — ABNORMAL LOW (ref 8.9–10.3)
Creatinine, Ser: 1.45 mg/dL — ABNORMAL HIGH (ref 0.61–1.24)
Glucose, Bld: 134 mg/dL — ABNORMAL HIGH (ref 70–99)

## 2017-12-28 NOTE — Progress Notes (Signed)
Inpatient Rehabilitation-Admissions Coordinator   Spoke with patient at the bedside. He is interested in post acute rehab as he recognizes he currently requires extensive assistance for mobility and transfers and has minimal assistance at home. AC has discussed the case with Case Management and Care Coordination who plan to have a case conference regarding pt's recovery care plan and long term needs/resources. Will follow up tomorrow once most appropriate disposition has been determined.     Jhonnie Garner, OTR/L  Rehab Admissions Coordinator  [phone] 12/28/2017 5:13 PM

## 2017-12-28 NOTE — Progress Notes (Signed)
Physical Therapy Treatment Patient Details Name: [name] [name] MRN: [id number] DOB: 1935-02-15 Today's Date: 12/28/2017    History of Present Illness Pt is a 83 y.o. male with Right weakness and HA  with multiple small acute infarcts in the left MCA and left occipital lobe, acute lacunar infarct in right centrum semiovale, significant left ICA stenosis s/p L CEA 10/8. PMHx: recurrent UTIs, seizures, CVA, Parkinson disease, hypertension, CKD stage III, DM    PT Comments    Pt received in bed, agreeable to participation in therapy. He required mod assist supine to sit and mod assist +2 safety sit to stand in stedy. Stedy utilized for transfer bed to recliner. Pt with c/o feeling more stiff today.  Follow Up Recommendations  CIR;Supervision/Assistance - 24 hour     Equipment Recommendations  3in1 (PT)    Recommendations for Other Services       Precautions / Restrictions Precautions Precautions: Fall Precaution Comments: L ankle monitor     Mobility  Bed Mobility Overal bed mobility: Needs Assistance Bed Mobility: Supine to Sit     Supine to sit: Mod assist;HOB elevated     General bed mobility comments: increased time and effort  Transfers Overall transfer level: Needs assistance   Transfers: Sit to/from Stand Sit to Stand: +2 safety/equipment;Mod assist         General transfer comment: use of stedy for pivot transfer bed to recliner  Ambulation/Gait                 Stairs             Wheelchair Mobility    Modified Rankin (Stroke Patients Only) Modified Rankin (Stroke Patients Only) Pre-Morbid Rankin Score: Moderate disability Modified Rankin: Severe disability     Balance Overall balance assessment: Needs assistance Sitting-balance support: Feet supported;No upper extremity supported Sitting balance-Leahy Scale: Fair     Standing balance support: Bilateral upper extremity supported Standing balance-Leahy Scale: Poor                              Cognition Arousal/Alertness: Awake/alert Behavior During Therapy: WFL for tasks assessed/performed Overall Cognitive Status: Within Functional Limits for tasks assessed                                        Exercises      General Comments        Pertinent Vitals/Pain Pain Assessment: Faces Faces Pain Scale: Hurts little more Pain Location: generalized Pain Descriptors / Indicators: Sore Pain Intervention(s): Monitored during session;Repositioned    Home Living                      Prior Function            PT Goals (current goals can now be found in the care plan section) Acute Rehab PT Goals Patient Stated Goal: feel better PT Goal Formulation: With patient Time For Goal Achievement: 01/02/18 Potential to Achieve Goals: Good Progress towards PT goals: Progressing toward goals    Frequency    Min 3X/week      PT Plan Current plan remains appropriate    Co-evaluation              AM-PAC PT "6 Clicks" Daily Activity  Outcome Measure  Difficulty turning over in bed (including adjusting bedclothes, sheets  and blankets)?: Unable Difficulty moving from lying on back to sitting on the side of the bed? : Unable Difficulty sitting down on and standing up from a chair with arms (e.g., wheelchair, bedside commode, etc,.)?: Unable Help needed moving to and from a bed to chair (including a wheelchair)?: A Lot Help needed walking in hospital room?: Total Help needed climbing 3-5 steps with a railing? : Total 6 Click Score: 7    End of Session Equipment Utilized During Treatment: Gait belt Activity Tolerance: Patient tolerated treatment well Patient left: in chair;with call bell/phone within reach Nurse Communication: Mobility status;Need for lift equipment PT Visit Diagnosis: Other symptoms and signs involving the nervous system (R29.898);Other abnormalities of gait and mobility (R26.89);Unsteadiness on feet  (R26.81)     Time: 2263-3354 PT Time Calculation (min) (ACUTE ONLY): 25 min  Charges:  $Therapeutic Activity: 23-37 mins                     Lorrin Goodell, PT  Office # [phone] Pager [phone]    Lorriane Shire 12/28/2017, 1:02 PM

## 2017-12-28 NOTE — Care Management Note (Signed)
Case Management Note Marvetta Gibbons RN, BSN Transitions of Care Unit 4E- RN Case Manager [phone]  Patient Details  Name: [name] [name] MRN: [id number] Date of Birth: 30-Dec-1934  Subjective/Objective:  Pt with Hx of CVA admitted for left CEA                 Action/Plan: PTA pt lived at home with wife who is blind, pt was ambulating with cane, has PCP that makes home visits, pt desires to return home, recommendations where made for SNF however pt has circumstances that do not allow STSNF placement (see CSW notes)- pt requiring extensive assistance for mobility, CIR consulted for possible admission and submitted for insurance auth.- concerns for pt's long term care plan present. Per pt he has used [facility] in the past for [facility] needs. CM spoke with [place] with [facility] this afternoon however pt does not have an in-network provider to have [place] with CIR has arranged a case conference tomorrow to discuss long term needs/resources. CM and CSW remain available for transition of care needs/planning.   Expected Discharge Date:  12/22/17               Expected Discharge Plan:  IP Rehab Facility  In-House Referral:  Clinical Social Work  Discharge planning Services  CM Consult  Post Acute Care Choice:  Home Health Choice offered to:  Patient  DME Arranged:    DME Agency:     HH Arranged:  Refused SNF [place] Agency:  [facility]  Status of Service:  In process, will continue to follow  If discussed at Long Length of Stay Meetings, dates discussed:    Discharge Disposition:   Additional Comments:  Dawayne Patricia, RN 12/28/2017, 6:13 PM

## 2017-12-28 NOTE — Progress Notes (Signed)
PROGRESS NOTE    [name] [name]  SEG:[id number] DOB: [birth year] DOA: 12/18/2017 PCP: Clovia Cuff, MD   Brief Narrative:  83 year old with past medical history relevant for BPH, type 2 diabetes on oral hypoglycemics, hyperlipidemia, seizure disorder, Parkinson's disease, prior CVA admitted with right-sided weakness and found to have multiple small acute infarcts in the left MCA and left occipital lobe, acute lacunar infarct in right centrum semiovale as well as 98% left carotid stenosis status post left carotid endarterectomy on 12/21/2017.   Assessment & Plan:   Principal Problem:   Right sided weakness Active Problems:   Hyperkalemia   Acute renal failure (HCC)   HTN (hypertension)   Diabetes mellitus, type 2 (HCC)   Hyperlipidemia associated with type 2 diabetes mellitus (HCC)   Seizures (HCC)   UTI (urinary tract infection)   Cerebral embolism with cerebral infarction   CVA (cerebral vascular accident) ([place])   Acute ischemic stroke ([place])   History of CVA (cerebrovascular accident)   Parkinson disease ([place])   Diastolic dysfunction   Hypokalemia   #) CVA status post left carotid endarterectomy on 12/21/17: -Neurology following, appreciate recommendations -Vascular surgery following, appreciate recommendations -Hemoglobin A1c 7.2, lipid panel shows LDL of 81 -Continue aspirin 81 mg -Continue clopidogrel 75 mg -Continue statin -Per neurology recommendation is a 30-day event monitor outpatient -Physical therapy is recommending inpatient rehab, pending evaluation by [facility] inpatient rehab  #)  type 2 diabetes: - Sliding scale insulin, AC at bedtime - Hold home sitagliptin 50 mill grams daily -Hold home metformin thousand mill grams twice daily  #) seizure disorder: - Continue levetiracetam 500 mg twice daily  #) Hyperlipidemia: -Continue statin  #)  BPH/UTI: -Patient completed a total of 5 days for E. coli UTI -Continue tamsulosin 0.4 mg daily -Continue finasteride  5 mg daily  Fluids: Tolerating p.o. Elect lites: Monitor and supplement  nutrition: Carb restricted diet  Prophylaxis: Enoxaparin  Disposition: Pending evaluation by inpatient rehab  Full code    Consultants:   Vascular surgery  Neurology  Procedures: 12/18/2017 echo: - Left ventricle: The cavity size was normal. There was moderate   concentric hypertrophy. Systolic function was normal. The   estimated ejection fraction was in the range of 50% to 55%. There   is akinesis of the inferoseptal myocardium. Doppler parameters   are consistent with abnormal left ventricular relaxation (grade 1   diastolic dysfunction). Doppler parameters are consistent with   high ventricular filling pressure. - Aortic valve: Trileaflet; mildly thickened, mildly calcified   leaflets. There was trivial regurgitation. - Mitral valve: There was mild regurgitation. - Left atrium: The atrium was mildly dilated. - Right ventricle: The cavity size was mildly dilated. Wall   thickness was normal. - Tricuspid valve: There was trivial regurgitation. - Pulmonary arteries: Systolic pressure was mildly increased. PA   peak pressure: 39 mm Hg (S).  Impressions:  - No cardiac source of emboli was indentified.  12/18/2016 carotid ultrasound:Final Interpretation: Right Carotid: Velocities in the right ICA are consistent with a 1-39% stenosis.  Left Carotid: Velocities in the left ICA are consistent with a 80-99% stenosis.  Vertebrals: Bilateral vertebral arteries demonstrate antegrade flow.  12/21/2017 left carotid endarterectomy  Antimicrobials:   IV ceftriaxone 12/17/2017 to 12/22/2017   Subjective: Patient sleeping in bed and does not have any complaints.  He denies any nausea, vomiting, diarrhea, cough, congestion.  Objective: Vitals:   12/27/17 1316 12/27/17 2041 12/28/17 0345 12/28/17 1033  BP: 103/67 (!) 156/88  (!) 151/88  Pulse: 91 90    Resp: (!) 24 18    Temp: 98.6 F (37 C) 97.8  F (36.6 C) 97.8 F (36.6 C)   TempSrc: Oral Oral    SpO2: 93% 95% 94% 96%  Weight:      Height:        Intake/Output Summary (Last 24 hours) at 12/28/2017 1041 Last data filed at 12/28/2017 0800 Gross per 24 hour  Intake 600 ml  Output 1350 ml  Net -750 ml   Filed Weights   12/18/17 1000  Weight: 102.1 kg    Examination:  General exam: Appears calm and comfortable  Respiratory system: Clear to auscultation. Respiratory effort normal. Cardiovascular system: Regular rate and rhythm, no murmurs a. Gastrointestinal system: Abdomen is nondistended, soft and nontender. No organomegaly or masses felt. Normal bowel sounds heard. Central nervous system: Alert and oriented.  3-4 out of 5 strength on right side on upper extremity, bilateral lower extremity strength 5 out of 5, cranial nerves II through XII intact Extremities: Trace lower extremity edema. Skin: Left neck incision site is clean dry and intact Psychiatry: Judgement and insight appear normal. Mood & affect appropriate.     Data Reviewed: I have personally reviewed following labs and imaging studies  CBC: Recent Labs  Lab 12/22/17 0432 12/28/17 0342  WBC 9.9 9.5  HGB 13.2 15.2  HCT 41.5 45.8  MCV 96.7 96.2  PLT 171 850   Basic Metabolic Panel: Recent Labs  Lab 12/22/17 0432 12/24/17 0338 12/25/17 0325 12/26/17 0241 12/28/17 0342  NA 140 142 141 142 139  K 3.7 3.3* 3.3* 3.6 3.6  CL 109 108 108 109 106  CO2 25 23 22 24 22   GLUCOSE 148* 121* 119* 134* 134*  BUN 27* 21 22 22  25*  CREATININE 1.44* 1.52* 1.42* 1.49* 1.45*  CALCIUM 8.2* 8.6* 8.8* 8.8* 8.8*   GFR: Estimated Creatinine Clearance: 47.8 mL/min (A) (by C-G formula based on SCr of 1.45 mg/dL (H)). Liver Function Tests: No results for input(s): AST, ALT, ALKPHOS, BILITOT, PROT, ALBUMIN in the last 168 hours. No results for input(s): LIPASE, AMYLASE in the last 168 hours. No results for input(s): AMMONIA in the last 168 hours. Coagulation  Profile: No results for input(s): INR, PROTIME in the last 168 hours. Cardiac Enzymes: No results for input(s): CKTOTAL, CKMB, CKMBINDEX, TROPONINI in the last 168 hours. BNP (last 3 results) No results for input(s): PROBNP in the last 8760 hours. HbA1C: No results for input(s): HGBA1C in the last 72 hours. CBG: Recent Labs  Lab 12/27/17 0553 12/27/17 1101 12/27/17 1605 12/27/17 2119 12/28/17 0623  GLUCAP 126* 154* 128* 160* 143*   Lipid Profile: No results for input(s): CHOL, HDL, LDLCALC, TRIG, CHOLHDL, LDLDIRECT in the last 72 hours. Thyroid Function Tests: No results for input(s): TSH, T4TOTAL, FREET4, T3FREE, THYROIDAB in the last 72 hours. Anemia Panel: No results for input(s): VITAMINB12, FOLATE, FERRITIN, TIBC, IRON, RETICCTPCT in the last 72 hours. Sepsis Labs: No results for input(s): PROCALCITON, LATICACIDVEN in the last 168 hours.  Recent Results (from the past 240 hour(s))  Urine culture     Status: Abnormal   Collection Time: 12/18/17  1:25 PM  Result Value Ref Range Status   Specimen Description URINE, RANDOM  Final   Special Requests   Final    NONE Performed at [facility], [address], [place], [place] [address]    Culture >=100,000 COLONIES/mL ESCHERICHIA COLI (A)  Final   Report Status 12/20/2017 FINAL  Final   Organism ID, Bacteria ESCHERICHIA COLI (A)  Final      Susceptibility   Escherichia coli - MIC*    AMPICILLIN >=32 RESISTANT Resistant     CEFAZOLIN >=64 RESISTANT Resistant     CEFTRIAXONE <=1 SENSITIVE Sensitive     CIPROFLOXACIN >=4 RESISTANT Resistant     GENTAMICIN >=16 RESISTANT Resistant     IMIPENEM <=0.25 SENSITIVE Sensitive     NITROFURANTOIN 64 INTERMEDIATE Intermediate     TRIMETH/SULFA >=320 RESISTANT Resistant     AMPICILLIN/SULBACTAM >=32 RESISTANT Resistant     PIP/TAZO 64 INTERMEDIATE Intermediate     Extended ESBL NEGATIVE Sensitive     * >=100,000 COLONIES/mL ESCHERICHIA COLI  Surgical pcr screen     Status:  None   Collection Time: 12/20/17  9:16 AM  Result Value Ref Range Status   MRSA, PCR NEGATIVE NEGATIVE Final   Staphylococcus aureus NEGATIVE NEGATIVE Final    Comment: (NOTE) The Xpert SA Assay (FDA approved for NASAL specimens in patients 50 years of age and older), is one component of a comprehensive surveillance program. It is not intended to diagnose infection nor to guide or monitor treatment. Performed at [facility], [place] [address], [place], [place] [address]          Radiology Studies: No results found.      Scheduled Meds: .  stroke: mapping our early stages of recovery book   Does not apply Once  . aspirin EC  81 mg Oral Daily  . clopidogrel  75 mg Oral Daily  . docusate sodium  100 mg Oral Daily  . finasteride  5 mg Oral Daily  . heparin  5,000 Units Subcutaneous Q8H  . Influenza vac split quadrivalent PF  0.5 mL Intramuscular Tomorrow-1000  . insulin aspart  0-5 Units Subcutaneous QHS  . insulin aspart  0-9 Units Subcutaneous TID WC  . levETIRAcetam  500 mg Oral BID  . pantoprazole  40 mg Oral Daily  . simvastatin  40 mg Oral Daily  . tamsulosin  0.4 mg Oral QPC supper   Continuous Infusions: . sodium chloride    . sodium chloride       LOS: 8 days    Time spent: [place], MD [facility]  If 7PM-7AM, please contact night-coverage [email or web address] Password [email or web address] 12/28/2017, 10:41 AM

## 2017-12-28 NOTE — PMR Pre-admission (Addendum)
PMR Admission Coordinator Pre-Admission Assessment  Patient: [name] [name] is an 83 y.o., male MRN: [id number] DOB: 09-08-1934 Height: 5\' 10"  (177.8 cm) Weight: 102.1 kg              Insurance Information HMO: Yes    PPO:      PCP:      IPA:      80/20:      OTHER:  PRIMARY: [employer]      Policy#: [id number]      Subscriber: Patient CM Name: [name] [name]      Phone#: [phone] ext. [phone]     Fax#: [phone] for [place] [phone] provided by Dawson Bills with [facility] Medicare on 10/14 with admission date confirmed for 12/30/17.  Pre-Cert#: [id number]      Employer:  Benefits:  Phone #: NA     Name: Availity online portal Eff. Date: 03-16-17     Deduct: $0      Out of Pocket Max: $3,400      Life Max: NA CIR: $295/day for days 1-6, $0/day for days 7+      SNF: $0/day for days 1-20, $172/day for days 21-100 (100 day limit) Outpatient: per necessity     Co-Pay: $10-40, pending outpt hospital vs office Home Health: per necessity, 100%      Co-Pay:  DME: 80%     Co-Pay: 20% Providers:  SECONDARY:       Policy#:       Subscriber:  CM Name:       Phone#:      Fax#:  Pre-Cert#:       Employer:  Benefits:  Phone #:      Name:  Eff. Date:      Deduct:       Out of Pocket Max:       Life Max:  CIR:       SNF:  Outpatient:      Co-Pay:  Home Health:       Co-Pay:  DME:      Co-Pay:   Medicaid Application Date:       Case Manager:  Disability Application Date:       Case Worker:   Emergency Contact Information Contact Information    Name Relation Home Work [place] Significant other [phone]  [phone]     Current Medical History  Patient Admitting Diagnosis: Multifocal infarcts  History of Present Illness: [name] [name] is an 83 year old right-handed male with history of recurrent UTI, CVA with right-sided residual weakness,Question Parkinson's disease, CKD stage III with latest creatinine 1.72, diabetes mellitus and seizure disorder maintained  on Keppra.  Per chart review and patient, patient lives with spouse.  Ambulated with a cane and still driving short distances.  One level home with 7 steps to entry.  Wife does assist with some ADLs.  Children in the area check as needed.  Presented 12/18/2017 with right-sided weakness and headache.  Urine drug screen negative.  Cranial CT scan reviewed, unremarkable for acute intracranial process.  Per report, old left caudate lacunar infarction.  Patient did not receive TPA.  MRI/MRA shows multiple small acute infarcts in the left MCA territory and left occipital lobe.  Acute lacunar infarction in the right centrum semi-ovale.  No major branch occlusion.  Echocardiogram with ejection fraction of 35% grade 1 diastolic dysfunction.  Carotid Dopplers with left ICA stenosis 80 to 99%.  Placed on aspirin and Plavix for CVA prophylaxis.  Subcutaneous heparin  for DVT prophylaxis.  A urine culture 12/18/2017 greater than 100,000 E. coli completing a course of Rocephin.  Vascular surgery follow-up to address symptomatic left carotid stenosis and underwent left carotid endarterectomy 12/21/2017 per Dr. Deitra Mayo.  Tolerating a regular consistency diet.  Therapy evaluations completed with recommendations of physical medicine rehab consult.  Patient was admitted for a comprehensive rehab program on 12/30/17.  Complete NIHSS TOTAL: 0    Past Medical History  Past Medical History:  Diagnosis Date  . Arthritis   . Diabetes mellitus without complication ([place])    type 2  . Prostate disease   . Renal calculi   . Renal disorder    kidney stones  . Seizures ([place])     Family History  family history is not on file.  Prior Rehab/Hospitalizations:  Has the patient had major surgery during 100 days prior to admission? No  Current Medications   Current Facility-Administered Medications:  .   stroke: mapping our early stages of recovery book, , Does not apply, Once, Gonfa, Taye T, MD .  0.9 %  sodium  chloride infusion, 500 mL, Intravenous, Once PRN, Gonfa, Taye T, MD .  0.9 %  sodium chloride infusion, , Intravenous, Continuous, Gonfa, Taye T, MD .  acetaminophen (TYLENOL) tablet 650 mg, 650 mg, Oral, Q4H PRN, 650 mg at 12/20/17 2211 **OR** acetaminophen (TYLENOL) solution 650 mg, 650 mg, Per Tube, Q4H PRN **OR** acetaminophen (TYLENOL) suppository 650 mg, 650 mg, Rectal, Q4H PRN, Gonfa, Taye T, MD .  aspirin EC tablet 81 mg, 81 mg, Oral, Daily, Gonfa, Taye T, MD, 81 mg at 12/30/17 0943 .  clopidogrel (PLAVIX) tablet 75 mg, 75 mg, Oral, Daily, Cyndia Skeeters, Taye T, MD, 75 mg at 12/30/17 0943 .  docusate sodium (COLACE) capsule 100 mg, 100 mg, Oral, Daily, Cyndia Skeeters, Taye T, MD, 100 mg at 12/30/17 0944 .  finasteride (PROSCAR) tablet 5 mg, 5 mg, Oral, Daily, Cyndia Skeeters, Taye T, MD, 5 mg at 12/30/17 0943 .  guaiFENesin-dextromethorphan (ROBITUSSIN DM) 100-10 MG/5ML syrup 15 mL, 15 mL, Oral, Q4H PRN, Gonfa, Taye T, MD .  heparin injection 5,000 Units, 5,000 Units, Subcutaneous, Q8H, Wendee Beavers T, MD, 5,000 Units at 12/30/17 0626 .  Influenza vac split quadrivalent PF (FLUZONE HIGH-DOSE) injection 0.5 mL, 0.5 mL, Intramuscular, Tomorrow-1000, Gonfa, Taye T, MD .  insulin aspart (novoLOG) injection 0-5 Units, 0-5 Units, Subcutaneous, QHS, Mercy Riding, MD, 2 Units at 12/19/17 2139 .  insulin aspart (novoLOG) injection 0-9 Units, 0-9 Units, Subcutaneous, TID WC, Mercy Riding, MD, 2 Units at 12/30/17 1204 .  levETIRAcetam (KEPPRA) tablet 500 mg, 500 mg, Oral, BID, Cyndia Skeeters, Taye T, MD, 500 mg at 12/30/17 0944 .  loperamide (IMODIUM) capsule 2 mg, 2 mg, Oral, PRN, Purohit, Shrey C, MD, 2 mg at 12/26/17 2202 .  morphine 2 MG/ML injection 2 mg, 2 mg, Intravenous, Q2H PRN, Gonfa, Taye T, MD .  ondansetron (ZOFRAN) injection 4 mg, 4 mg, Intravenous, Q6H PRN, Gonfa, Taye T, MD .  oxyCODONE-acetaminophen (PERCOCET/ROXICET) 5-325 MG per tablet 1-2 tablet, 1-2 tablet, Oral, Q4H PRN, Gonfa, Taye T, MD .  pantoprazole (PROTONIX)  EC tablet 40 mg, 40 mg, Oral, Daily, Cyndia Skeeters, Taye T, MD, 40 mg at 12/30/17 0943 .  phenol (CHLORASEPTIC) mouth spray 1 spray, 1 spray, Mouth/Throat, PRN, Gonfa, Taye T, MD .  senna-docusate (Senokot-S) tablet 1 tablet, 1 tablet, Oral, QHS PRN, Gonfa, Taye T, MD .  simvastatin (ZOCOR) tablet 40 mg, 40 mg, Oral, Daily, Gonfa, Taye  T, MD, 40 mg at 12/30/17 0944 .  tamsulosin (FLOMAX) capsule 0.4 mg, 0.4 mg, Oral, QPC supper, Cyndia Skeeters, Taye T, MD, 0.4 mg at 12/29/17 1602  Patients Current Diet:  Diet Order            Diet heart healthy/carb modified Room service appropriate? Yes; Fluid consistency: Thin  Diet effective now              Precautions / Restrictions Precautions Precautions: Fall Precaution Comments: L ankle monitor  Restrictions Weight Bearing Restrictions: No   Has the patient had 2 or more falls or a fall with injury in the past year?No  Prior Activity Level Limited Community (1-2x/wk): drove, got out of house 3-4x/week  [place] Devices/Equipment: Radio producer (specify quad or straight), Eyeglasses, Environmental consultant (specify type)(glasses for close up, Rolator walker) Home Equipment: Environmental consultant - 2 wheels, Walker - 4 wheels, [place] - single point, Bedside commode, Shower seat, Grab bars - tub/shower  Prior Device Use: Indicate devices/aids used by the patient prior to current illness, exacerbation or injury? Walker and single point cane; mostly used cane PTA  Prior Functional Level Prior Function Level of Independence: Needs assistance Gait / Transfers Assistance Needed: ambulated with cane ADL's / Homemaking Assistance Needed: linda (wife) helps with the showering, cooks but patient reports self dresses(reports linda completes all bathing, pt holding onto grabbar)  Self Care: Did the patient need help bathing, dressing, using the toilet or eating?  Independent with up to min/mod A for bathing per pt (pt would stand and hold grab bars as his SO would  bathe him).   Indoor Mobility: Did the patient need assistance with walking from room to room (with or without device)? Independent  Stairs: Did the patient need assistance with internal or external stairs (with or without device)? Independent  Functional Cognition: Did the patient need help planning regular tasks such as shopping or remembering to take medications? Independent  Current Functional Level Cognition  Arousal/Alertness: Awake/alert Overall Cognitive Status: Within Functional Limits for tasks assessed Current Attention Level: Selective Orientation Level: Oriented X4 Following Commands: Follows one step commands with increased time, Follows one step commands consistently Safety/Judgement: Decreased awareness of safety, Decreased awareness of deficits General Comments: wfl for this session.  Pt followed all one step commands, kept attention to task in minimally distracting environment. Asking about CIR and therapy requirements Attention: Sustained Sustained Attention: Appears intact Memory: Impaired Memory Impairment: Retrieval deficit, Decreased short term memory Decreased Short Term Memory: Verbal basic Awareness: Impaired Awareness Impairment: Intellectual impairment    Extremity Assessment (includes Sensation/Coordination)  Upper Extremity Assessment: RUE deficits/detail RUE Deficits / Details: able to use as gross assist RUE Sensation: decreased light touch, decreased proprioception RUE Coordination: decreased fine motor, decreased gross motor LUE Deficits / Details: tremor noted but pt able to use functionally LUE Sensation: WNL LUE Coordination: (has tremor which doesn't interfere)  Lower Extremity Assessment: RLE deficits/detail RLE Deficits / Details: modest assymetrical weakness, and liited functional strength during mobility RLE Coordination: decreased gross motor    ADLs  Overall ADL's : Needs assistance/impaired Eating/Feeding: Set up, Bed  level Eating/Feeding Details (indicate cue type and reason): used L hand; decreased motor control R Grooming: Wash/dry hands, Wash/dry face, Brushing hair, Sitting, Set up Grooming Details (indicate cue type and reason): primary with use of LUE Upper Body Bathing: Minimal assistance, Bed level(brought LUE over; cannot reach under arm) Lower Body Bathing: Maximal assistance, Sit to/from stand Upper Body Dressing : Moderate  assistance, Sitting Lower Body Dressing: Total assistance Lower Body Dressing Details (indicate cue type and reason): for socks with +2 Mod A sit>stand (for safety) from bed (increased time), forward stooped posture Toilet Transfer: Maximal assistance, +2 for physical assistance Toilet Transfer Details (indicate cue type and reason): ambulate 5 feet to sit in recliner behind him; heavy lean to right with tendency for stooped posture Toileting- Clothing Manipulation and Hygiene: Minimal assistance, Sit to/from stand General ADL Comments: used stedy with RN assisting to get to chair; performed grooming and RUE strengthening    Mobility  Overal bed mobility: Needs Assistance Bed Mobility: Supine to Sit Rolling: Min assist Sidelying to sit: Mod assist, HOB elevated Supine to sit: Mod assist, +2 for physical assistance, HOB elevated General bed mobility comments: increased time and effort    Transfers  Overall transfer level: Needs assistance Equipment used: Rolling walker (2 wheeled) Transfer via Lift Equipment: Stedy Transfers: Sit to/from Stand Sit to Stand: Mod assist, +2 physical assistance, From elevated surface Squat pivot transfers: Max assist, +2 physical assistance General transfer comment: stedy used for transfer to chair    Ambulation / Gait / Stairs / Wheelchair Mobility  Ambulation/Gait Ambulation/Gait assistance: +2 physical assistance, Mod assist Gait Distance (Feet): 2 Feet Assistive device: Rolling walker (2 wheeled) Gait Pattern/deviations: Step-to  pattern, Decreased step length - right, Decreased step length - left, Shuffle, Wide base of support General Gait Details: Assist for balance and support. Pt with bil knees in flexion. Rt lateral lean. Assist with movement and stability of walker. Gait velocity: decr Gait velocity interpretation: <1.31 ft/sec, indicative of household ambulator    Posture / Balance Dynamic Sitting Balance Sitting balance - Comments: pt able to sit EOB without UE support in static sitting Balance Overall balance assessment: Needs assistance Sitting-balance support: Feet supported, No upper extremity supported Sitting balance-Leahy Scale: Fair Sitting balance - Comments: pt able to sit EOB without UE support in static sitting Standing balance support: Bilateral upper extremity supported Standing balance-Leahy Scale: Poor Standing balance comment: walker and mod assist for static standing    Special needs/care consideration BiPAP/CPAP: no CPM: no Continuous Drip IV: 0.9% sodium chloride infusion  Dialysis: no        Days: no Life Vest: no Oxygen: no Special Bed: pt is on seizure precautions with padded bed due to history of seizures (per pt, last one approx 1 year ago).  Trach Size: no Wound Vac (area): no      Location: no Skin: Left neck surgical incision; closed perineum incision                      Bowel mgmt: continent, 12/29/17 Bladder mgmt: external cath in place Diabetic mgmt: yes     Previous Home Environment Living Arrangements: Spouse/significant other, Children  Lives With: Spouse Available Help at Discharge: Family Type of Home: House Home Layout: One level Home Access: Stairs to enter Entrance Stairs-Rails: Can reach both Entrance Stairs-Number of Steps: 7 Bathroom Shower/Tub: Tub/shower unit, Architectural technologist: [place]: No  Discharge Living Setting Plans for Discharge Living Setting: Patient's home, Lives with (comment), Other (Comment)(Lives with  [name] (SO) & Durene Fruits (83yo M) w/ cognitive deficits) Type of Home at Discharge: House Discharge Home Layout: One level Discharge Home Access: Stairs to enter; pt reports having ramp in back entrance.  Entrance Stairs-Rails: Right, Left(only able to reach 1 at a time) Entrance Stairs-Number of Steps: 7 Discharge Bathroom Shower/Tub: Tub/shower unit(with tub transfer  bench) Discharge Bathroom Toilet: Standard Discharge Bathroom Accessibility: No(may be able to side step with RW?) Does the patient have any problems obtaining your medications?: Yes (Describe)(now no longer able to drive; may need delivery services)  Social/Family/Support Systems Patient Roles: Partner(retired; has SO that cooks, cleans, and PRN assists ) Contact Information: emergency contact is [name], pt's SOI  Anticipated Caregiver: [name]: home [phone]) cell [phone]) Anticipated Caregiver's Contact Information: see above Ability/Limitations of Caregiver: Supervision/Min A transfers; up to Min/Mod bathing Caregiver Availability: 24/7 Discharge Plan Discussed with Primary Caregiver: Yes Is Caregiver In Agreement with Plan?: Yes Does Caregiver/Family have Issues with Lodging/Transportation while Pt is in Rehab?: Yes([name] is blind; unable to get transport to Rehab)   Goals/Additional Needs Patient/Family Goal for Rehab: PT: Min A; OT: Min/Mod; SLP: NA Expected length of stay: 12-18 days Cultural Considerations: Pt states he is [place]; Psychologist, forensic Dietary Needs: heart healthy, carb modified, thin liquids Equipment Needs: TBD Special Service Needs: pt prefers [place] for [facility] therapy at DC Pt/Family Agrees to Admission and willing to participate: Yes Program Orientation Provided & Reviewed with Pt/Caregiver Including Roles  & Responsibilities: Yes(pt and [name])  Barriers to Discharge: Inaccessible home environment, Home environment access/layout, Lack of/limited family support, Insurance for SNF coverage(minimal physical  assist at home; pt unable to go to SNF)  Barriers to Discharge Comments: (legal issues prevent pt from going to SNF)   Decrease burden of Care through IP rehab admission: Decrease number of caregivers, Patient/family education. Pt unable to go to SNF due to legal issues. Pt and family aware that rehab is focused on improving his safety prior to returning home but that pt may still require physical assistance at DC; pt's significant other unable to provide much physical assist but aware CIR is his only option for intensive rehab at this time prior to returning home. Pt also explicitly refuses placement besides home after CIR, regardless of assistance recommended at [place] is for pt to return home after CIR stay with Home Health (pt has used [place] prior). Would anticipate pt will need [place] aide. Pt has Primary Care MD that makes house calls and the VP Care Coordinator through [facility] continues to search for more resources through the pt's primary care office to see if he would qualify for SW/CM at DC.   Possible need for SNF placement upon discharge: Pt unable to be admitted to SNF due to legal issues. SNF is not an option per case management. Pt has adamantly refused any placement bedsides home after CIR, regardless of assistance recommended at DC.    Patient Condition: This patient's medical and functional status has changed since the consult dated 12/20/17 in which the Rehabilitation Physician documented that the patient was not appropriate for intensive rehabilitative care in an inpatient rehabilitation facility. Since consult, it has been disclosed that pt is unable to be admitted to SNF, the recommended discharge disposition, due to legal issues and thus pt would have to DC home straight from acute with high fall risk and need for two person assist for transfers. In order to reduce burden of care on pt's significant other and reduction in fall risk, pt is to be admitted to [facility] for  short stay prior to returning home with anticipated home health therapy. Due to change in status and issues being addressed, patient's case has been discussed with Dr Naaman Plummer and patient now appropriate for inpatient rehabilitation. Will admit to inpatient rehab today.   Preadmission Screen Completed By:  Jhonnie Garner, 12/30/2017 1:22 PM ______________________________________________________________________   Discussed status with Dr. Naaman Plummer on 12/28/17 and 12/29/17 as well as on 12/30/17  at 1:24 PM and received telephone approval for admission today.  Admission Coordinator:  Jhonnie Garner, time 1:24PM/Date 12/30/17

## 2017-12-28 NOTE — Progress Notes (Signed)
   VASCULAR SURGERY ASSESSMENT & PLAN:   7 Days Post-Op s/p: Right CEA  Awaiting transfer to CIR.  SUBJECTIVE:   No complaints.  PHYSICAL EXAM:   Vitals:   12/27/17 2041 12/28/17 0345 12/28/17 1033 12/28/17 1454  BP: (!) 156/88  (!) 151/88 (!) 172/91  Pulse: 90   90  Resp: 18   17  Temp: 97.8 F (36.6 C) 97.8 F (36.6 C)  98 F (36.7 C)  TempSrc: Oral   Oral  SpO2: 95% 94% 96% 95%  Weight:      Height:       Left neck incision looks fine. Continues to show some improvement in right sided weakness.   LABS:   Lab Results  Component Value Date   WBC 9.5 12/28/2017   HGB 15.2 12/28/2017   HCT 45.8 12/28/2017   MCV 96.2 12/28/2017   PLT 183 12/28/2017   Lab Results  Component Value Date   CREATININE 1.45 (H) 12/28/2017   Lab Results  Component Value Date   INR 1.10 12/18/2017   CBG (last 3)  Recent Labs    12/27/17 2119 12/28/17 0623 12/28/17 1138  GLUCAP 160* 143* 184*    PROBLEM LIST:    Principal Problem:   Right sided weakness Active Problems:   Hyperkalemia   Acute renal failure (HCC)   HTN (hypertension)   Diabetes mellitus, type 2 (HCC)   Hyperlipidemia associated with type 2 diabetes mellitus (HCC)   Seizures (HCC)   UTI (urinary tract infection)   Cerebral embolism with cerebral infarction   CVA (cerebral vascular accident) ([place])   Acute ischemic stroke ([place])   History of CVA (cerebrovascular accident)   Parkinson disease ([place])   Diastolic dysfunction   Hypokalemia   CURRENT MEDS:   .  stroke: mapping our early stages of recovery book   Does not apply Once  . aspirin EC  81 mg Oral Daily  . clopidogrel  75 mg Oral Daily  . docusate sodium  100 mg Oral Daily  . finasteride  5 mg Oral Daily  . heparin  5,000 Units Subcutaneous Q8H  . Influenza vac split quadrivalent PF  0.5 mL Intramuscular Tomorrow-1000  . insulin aspart  0-5 Units Subcutaneous QHS  . insulin aspart  0-9 Units Subcutaneous TID WC  . levETIRAcetam  500 mg Oral  BID  . pantoprazole  40 mg Oral Daily  . simvastatin  40 mg Oral Daily  . tamsulosin  0.4 mg Oral QPC supper    [name] [name] Beeper: [phone] Office: [phone] 12/28/2017

## 2017-12-28 NOTE — Consult Note (Signed)
   [facility] CM Inpatient Consult   12/28/2017  NIMROD WENDT [birth year] [id number]   Consult received from inpatient regarding patient with [facility].  Patient is noted not to have a [facility] provider.  Dr. Arnette Norris Asenso,[[facility]] his current primary care provider,  is not an affiliate.  Patient last seen by a [facility] provider in June of 2017.   Natividad Brood, RN BSN [facility] Liaison  [phone] business mobile phone Toll free office [phone]

## 2017-12-29 DIAGNOSIS — I63413 Cerebral infarction due to embolism of bilateral middle cerebral arteries: Secondary | ICD-10-CM

## 2017-12-29 LAB — GLUCOSE, CAPILLARY
GLUCOSE-CAPILLARY: 143 mg/dL — AB (ref 70–99)
GLUCOSE-CAPILLARY: 165 mg/dL — AB (ref 70–99)
Glucose-Capillary: 175 mg/dL — ABNORMAL HIGH (ref 70–99)
Glucose-Capillary: 167 mg/dL — ABNORMAL HIGH (ref 70–99)

## 2017-12-29 NOTE — Care Management Note (Addendum)
Case Management Note Marvetta Gibbons RN, BSN Transitions of Care Unit 4E- RN Case Manager [phone]  Patient Details  Name: BENINO KORINEK MRN: [id number] Date of Birth: 1935/03/12  Subjective/Objective:  Pt with Hx of CVA admitted for left CEA                 Action/Plan: PTA pt lived at home with wife who is blind, pt was ambulating with cane, has PCP that makes home visits, pt desires to return home, recommendations where made for SNF however pt has circumstances that do not allow STSNF placement (see CSW notes)- pt requiring extensive assistance for mobility, CIR consulted for possible admission and submitted for insurance auth.- concerns for pt's long term care plan present. Per pt he has used [facility] in the past for [facility] needs. CM spoke with [place] with [facility] this afternoon however pt does not have an in-network provider to have [place] with CIR has arranged a case conference tomorrow to discuss long term needs/resources. CM and CSW remain available for transition of care needs/planning.   Expected Discharge Date:  12/22/17               Expected Discharge Plan:  IP Rehab Facility  In-House Referral:  Clinical Social Work  Discharge planning Services  CM Consult  Post Acute Care Choice:  Home Health Choice offered to:  Patient  DME Arranged:    DME Agency:     HH Arranged:  Refused SNF [place] Agency:  [place]  Status of Service:  In process, will continue to follow  If discussed at Long Length of Stay Meetings, dates discussed:    Discharge Disposition:   Additional Comments:  12/29/17- 1300- Marvetta Gibbons RN, CM- pt awaits final word on CIR admission- spoke with pt at bedside to confirm with pt transition post CIR admission- Pt states he really wants to go to CIR and work hard to get his mobility back to return home with wife who is blind at home- pt reports that wife is at home now caring for herself- she can do own cooking and cleaning and ADLs. -  discussed with pt chance that he may not get to point of returning home after admission to rehab here and would he be agreeable to going to a skilled facility for further rehab- pt states NO he would not. Again clarified with pt about going to STSNF for further rehab needs and pt states "I am going home" (pt also has hx that would not make placement possible). Then discussed with pt [place] services which pt is very agreeable to- states he would want to use [facility] again for any HH needs- at this time plan would be for pt to return home with wife after rehab stay with [facility] services. Pt has visiting home PCP that does home visits. Hopeful that with IP rehab stay pt will gain mobility to allow safe transition home. Have spoken with Delila Pereyra with IP rehab and also [place] -VP of care coordination.   Dawayne Patricia, RN 12/29/2017, 2:20 PM

## 2017-12-29 NOTE — Progress Notes (Signed)
Occupational Therapy Treatment Patient Details Name: [name] [name] MRN: [id number] DOB: 06-07-1934 Today's Date: 12/29/2017    History of present illness Pt is a 83 y.o. male with Right weakness and HA  with multiple small acute infarcts in the left MCA and left occipital lobe, acute lacunar infarct in right centrum semiovale, significant left ICA stenosis s/p L CEA 10/8. PMHx: recurrent UTIs, seizures, CVA, Parkinson disease, hypertension, CKD stage III, DM   OT comments  Worked on bed mobility, RUE strengthening and sit to stand using stedy.  Used stedy to get into chair  Follow Up Recommendations  CIR    Equipment Recommendations  (to be further assessed)    Recommendations for Other Services      Precautions / Restrictions Precautions Precautions: Fall       Mobility Bed Mobility         Supine to sit: Mod assist;+2 for physical assistance;HOB elevated     General bed mobility comments: increased time and effort  Transfers       Sit to Stand: Mod assist;+2 physical assistance;From elevated surface         General transfer comment: stedy used for transfer to chair    Balance     Sitting balance-Leahy Scale: Fair                                     ADL either performed or assessed with clinical judgement   ADL       Grooming: Wash/dry hands;Wash/dry face;Brushing hair;Sitting;Set up Grooming Details (indicate cue type and reason): primary with use of LUE                               General ADL Comments: used stedy with RN assisting to get to chair; performed grooming and RUE strengthening     Vision       Perception     Praxis      Cognition Arousal/Alertness: Awake/alert Behavior During Therapy: WFL for tasks assessed/performed Overall Cognitive Status: Within Functional Limits for tasks assessed                                          Exercises General Exercises - Upper  Extremity Shoulder Flexion: AAROM;10 reps;Right;Seated(stretch at end ranges of shoulder exercises) Shoulder Horizontal ABduction: AAROM;10 reps;Seated Elbow Flexion: AAROM;Right;10 reps;Seated(decreased motor control) Elbow Extension: (gravity eliminated)   Shoulder Instructions       General Comments      Pertinent Vitals/ Pain       Pain Assessment: Faces Faces Pain Scale: Hurts a little bit Pain Location: generalized Pain Descriptors / Indicators: Sore Pain Intervention(s): Limited activity within patient's tolerance;Monitored during session;Repositioned  Home Living                                          Prior Functioning/Environment              Frequency  Min 2X/week        Progress Toward Goals  OT Goals(current goals can now be found in the care plan section)  Progress towards OT goals: Progressing toward goals  Plan Discharge plan remains appropriate    Co-evaluation                 AM-PAC PT "6 Clicks" Daily Activity     Outcome Measure   Help from another person eating meals?: A Little Help from another person taking care of personal grooming?: A Little Help from another person toileting, which includes using toliet, bedpan, or urinal?: A Lot Help from another person bathing (including washing, rinsing, drying)?: A Lot Help from another person to put on and taking off regular upper body clothing?: A Lot Help from another person to put on and taking off regular lower body clothing?: Total 6 Click Score: 13    End of Session    OT Visit Diagnosis: Unsteadiness on feet (R26.81);Other abnormalities of gait and mobility (R26.89);Muscle weakness (generalized) (M62.81);Other symptoms and signs involving cognitive function;Hemiplegia and hemiparesis Hemiplegia - Right/Left: Right Hemiplegia - dominant/non-dominant: Dominant Hemiplegia - caused by: Other cerebrovascular disease   Activity Tolerance Patient tolerated  treatment well   Patient Left in chair;with call bell/phone within reach;with chair alarm set   Nurse Communication          Time: [phone] OT Time Calculation (min): 35 min  Charges: OT General Charges $OT Visit: 1 Visit OT Treatments $Therapeutic Activity: 8-22 mins $Therapeutic Exercise: 8-22 mins  [name] [name], OTR/L Acute Rehabilitation Services [phone] [place] pager [phone] office 12/29/2017   [name] [name] 12/29/2017, 2:42 PM

## 2017-12-29 NOTE — Progress Notes (Signed)
Inpatient Rehabilitation-Admissions Coordinator   Spoke with pt at the bedside. Pt still interested in CIR. AC is able to admit pt to CIR tomorrow, pending medical clearance and bed availability. Will follow up tomorrow.   Please call if questions.   Jhonnie Garner, OTR/L  Rehab Admissions Coordinator  [phone] 12/29/2017 3:35 PM

## 2017-12-29 NOTE — Progress Notes (Addendum)
PROGRESS NOTE    [name] [name]  BSJ:[id number] DOB: 1934/10/26 DOA: 12/18/2017 PCP: Clovia Cuff, MD   Brief Narrative:  83 year old with past medical history relevant for BPH, type 2 diabetes on oral hypoglycemics, hyperlipidemia, seizure disorder, Parkinson's disease, prior CVA admitted with right-sided weakness and found to have multiple small acute infarcts in the left MCA and left occipital lobe, acute lacunar infarct in right centrum semiovale as well as 98% left carotid stenosis status post left carotid endarterectomy on 12/21/2017.   Assessment & Plan:   Principal Problem:   Right sided weakness Active Problems:   Hyperkalemia   Acute renal failure (HCC)   HTN (hypertension)   Diabetes mellitus, type 2 (HCC)   Hyperlipidemia associated with type 2 diabetes mellitus (HCC)   Seizures ([place])   UTI (urinary tract infection)   Cerebral embolism with cerebral infarction   CVA (cerebral vascular accident) ([place])   Acute ischemic stroke ([place])   History of CVA (cerebrovascular accident)   Parkinson disease ([place])   Diastolic dysfunction   Hypokalemia   CVA status post left carotid endarterectomy on 12/21/17 Neurology following, appreciate recommendations Vascular surgery following, appreciate recommendations Hemoglobin A1c 7.2, lipid panel shows LDL of 81 Continue aspirin 81 mg Continue clopidogrel 75 mg Continue statin Per neurology recommendation is a 30-day event monitor outpatient Physical therapy is recommending inpatient rehab  Type 2 diabetes Sliding scale insulin, AC at bedtime Hold home sitagliptin Hold home metformin  CKD stage 3 Creatinine stable at baseline  Seizure disorder Continue levetiracetam  Hyperlipidemia Continue statin  BPH/UTI Patient completed a total of 5 days for E. coli UTI Continue tamsulosin 0.4 mg daily Continue finasteride 5 mg daily    Prophylaxis: Enoxaparin  Disposition: Likely CIR on 12/30/17  Full  code    Consultants:   Vascular surgery  Neurology  Procedures: 12/18/2017 echo: - Left ventricle: The cavity size was normal. There was moderate   concentric hypertrophy. Systolic function was normal. The   estimated ejection fraction was in the range of 50% to 55%. There   is akinesis of the inferoseptal myocardium. Doppler parameters   are consistent with abnormal left ventricular relaxation (grade 1   diastolic dysfunction). Doppler parameters are consistent with   high ventricular filling pressure. - Aortic valve: Trileaflet; mildly thickened, mildly calcified   leaflets. There was trivial regurgitation. - Mitral valve: There was mild regurgitation. - Left atrium: The atrium was mildly dilated. - Right ventricle: The cavity size was mildly dilated. Wall   thickness was normal. - Tricuspid valve: There was trivial regurgitation. - Pulmonary arteries: Systolic pressure was mildly increased. PA   peak pressure: 39 mm Hg (S).  Impressions:  - No cardiac source of emboli was indentified.  12/18/2016 carotid ultrasound:Final Interpretation: Right Carotid: Velocities in the right ICA are consistent with a 1-39% stenosis.  Left Carotid: Velocities in the left ICA are consistent with a 80-99% stenosis.  Vertebrals: Bilateral vertebral arteries demonstrate antegrade flow.  12/21/2017 left carotid endarterectomy  Antimicrobials:   IV ceftriaxone 12/17/2017 to 12/22/2017   Subjective: Patient denies any new complaints.  Objective: Vitals:   12/29/17 0353 12/29/17 0830 12/29/17 1157 12/29/17 1200  BP: 130/65  (!) 138/58 (!) 138/58  Pulse: 68  64   Resp: 20 17 16 20   Temp: 97.6 F (36.4 C)  98.1 F (36.7 C)   TempSrc: Oral  Oral   SpO2: 93%  95%   Weight:      Height:  Intake/Output Summary (Last 24 hours) at 12/29/2017 1818 Last data filed at 12/29/2017 1758 Gross per 24 hour  Intake -  Output 1700 ml  Net -1700 ml   Filed Weights   12/18/17 1000   Weight: 102.1 kg    Examination:  General exam: NAD Respiratory system: CTAB Cardiovascular system: S1, S2 present Gastrointestinal system: Abdomen soft, nondistended, nontender, bowel sounds present Extremities: Trace bilateral LLE Skin: Left neck insertion c/d/i Psychiatry: Normal mood    Data Reviewed: I have personally reviewed following labs and imaging studies  CBC: Recent Labs  Lab 12/28/17 0342  WBC 9.5  HGB 15.2  HCT 45.8  MCV 96.2  PLT 979   Basic Metabolic Panel: Recent Labs  Lab 12/24/17 0338 12/25/17 0325 12/26/17 0241 12/28/17 0342  NA 142 141 142 139  K 3.3* 3.3* 3.6 3.6  CL 108 108 109 106  CO2 23 22 24 22   GLUCOSE 121* 119* 134* 134*  BUN 21 22 22  25*  CREATININE 1.52* 1.42* 1.49* 1.45*  CALCIUM 8.6* 8.8* 8.8* 8.8*   GFR: Estimated Creatinine Clearance: 47.8 mL/min (A) (by C-G formula based on SCr of 1.45 mg/dL (H)). Liver Function Tests: No results for input(s): AST, ALT, ALKPHOS, BILITOT, PROT, ALBUMIN in the last 168 hours. No results for input(s): LIPASE, AMYLASE in the last 168 hours. No results for input(s): AMMONIA in the last 168 hours. Coagulation Profile: No results for input(s): INR, PROTIME in the last 168 hours. Cardiac Enzymes: No results for input(s): CKTOTAL, CKMB, CKMBINDEX, TROPONINI in the last 168 hours. BNP (last 3 results) No results for input(s): PROBNP in the last 8760 hours. HbA1C: No results for input(s): HGBA1C in the last 72 hours. CBG: Recent Labs  Lab 12/28/17 1618 12/28/17 2049 12/29/17 0538 12/29/17 1117 12/29/17 1600  GLUCAP 151* 169* 143* 165* 175*   Lipid Profile: No results for input(s): CHOL, HDL, LDLCALC, TRIG, CHOLHDL, LDLDIRECT in the last 72 hours. Thyroid Function Tests: No results for input(s): TSH, T4TOTAL, FREET4, T3FREE, THYROIDAB in the last 72 hours. Anemia Panel: No results for input(s): VITAMINB12, FOLATE, FERRITIN, TIBC, IRON, RETICCTPCT in the last 72 hours. Sepsis Labs: No  results for input(s): PROCALCITON, LATICACIDVEN in the last 168 hours.  Recent Results (from the past 240 hour(s))  Surgical pcr screen     Status: None   Collection Time: 12/20/17  9:16 AM  Result Value Ref Range Status   MRSA, PCR NEGATIVE NEGATIVE Final   Staphylococcus aureus NEGATIVE NEGATIVE Final    Comment: (NOTE) The Xpert SA Assay (FDA approved for NASAL specimens in patients 23 years of age and older), is one component of a comprehensive surveillance program. It is not intended to diagnose infection nor to guide or monitor treatment. Performed at [facility], [place] [address], [place], [place] [address]          Radiology Studies: No results found.      Scheduled Meds: .  stroke: mapping our early stages of recovery book   Does not apply Once  . aspirin EC  81 mg Oral Daily  . clopidogrel  75 mg Oral Daily  . docusate sodium  100 mg Oral Daily  . finasteride  5 mg Oral Daily  . heparin  5,000 Units Subcutaneous Q8H  . Influenza vac split quadrivalent PF  0.5 mL Intramuscular Tomorrow-1000  . insulin aspart  0-5 Units Subcutaneous QHS  . insulin aspart  0-9 Units Subcutaneous TID WC  . levETIRAcetam  500 mg Oral  BID  . pantoprazole  40 mg Oral Daily  . simvastatin  40 mg Oral Daily  . tamsulosin  0.4 mg Oral QPC supper   Continuous Infusions: . sodium chloride    . sodium chloride       LOS: 9 days    Time spent: 25 mins    Alma Friendly, MD [facility]  If 7PM-7AM, please contact night-coverage [email or web address] 12/29/2017, 6:18 PM

## 2017-12-30 ENCOUNTER — Inpatient Hospital Stay (HOSPITAL_COMMUNITY)
Admission: RE | Admit: 2017-12-30 | Discharge: 2018-01-18 | DRG: 057 | Disposition: A | Discharge status: Home Health | Payer: Medicare HMO | Source: Intra-hospital | Attending: Physical Medicine & Rehabilitation | Admitting: Physical Medicine & Rehabilitation

## 2017-12-30 ENCOUNTER — Encounter (HOSPITAL_COMMUNITY): Payer: Self-pay | Admitting: Emergency Medicine

## 2017-12-30 ENCOUNTER — Other Ambulatory Visit: Payer: Self-pay

## 2017-12-30 DIAGNOSIS — E1165 Type 2 diabetes mellitus with hyperglycemia: Secondary | ICD-10-CM

## 2017-12-30 DIAGNOSIS — E1122 Type 2 diabetes mellitus with diabetic chronic kidney disease: Secondary | ICD-10-CM | POA: Diagnosis not present

## 2017-12-30 DIAGNOSIS — N183 Chronic kidney disease, stage 3 unspecified: Secondary | ICD-10-CM

## 2017-12-30 DIAGNOSIS — Z23 Encounter for immunization: Secondary | ICD-10-CM | POA: Diagnosis not present

## 2017-12-30 DIAGNOSIS — R7309 Other abnormal glucose: Secondary | ICD-10-CM

## 2017-12-30 DIAGNOSIS — R0981 Nasal congestion: Secondary | ICD-10-CM

## 2017-12-30 DIAGNOSIS — G40909 Epilepsy, unspecified, not intractable, without status epilepticus: Secondary | ICD-10-CM

## 2017-12-30 DIAGNOSIS — N179 Acute kidney failure, unspecified: Secondary | ICD-10-CM | POA: Diagnosis not present

## 2017-12-30 DIAGNOSIS — Z79899 Other long term (current) drug therapy: Secondary | ICD-10-CM | POA: Diagnosis not present

## 2017-12-30 DIAGNOSIS — I639 Cerebral infarction, unspecified: Secondary | ICD-10-CM | POA: Insufficient documentation

## 2017-12-30 DIAGNOSIS — I63512 Cerebral infarction due to unspecified occlusion or stenosis of left middle cerebral artery: Secondary | ICD-10-CM

## 2017-12-30 DIAGNOSIS — Z6834 Body mass index (BMI) 34.0-34.9, adult: Secondary | ICD-10-CM | POA: Diagnosis not present

## 2017-12-30 DIAGNOSIS — N400 Enlarged prostate without lower urinary tract symptoms: Secondary | ICD-10-CM | POA: Diagnosis not present

## 2017-12-30 DIAGNOSIS — Z7984 Long term (current) use of oral hypoglycemic drugs: Secondary | ICD-10-CM | POA: Diagnosis not present

## 2017-12-30 DIAGNOSIS — Z87440 Personal history of urinary (tract) infections: Secondary | ICD-10-CM | POA: Diagnosis not present

## 2017-12-30 DIAGNOSIS — E1169 Type 2 diabetes mellitus with other specified complication: Secondary | ICD-10-CM | POA: Diagnosis not present

## 2017-12-30 DIAGNOSIS — G20 Parkinson's disease: Secondary | ICD-10-CM | POA: Diagnosis present

## 2017-12-30 DIAGNOSIS — E669 Obesity, unspecified: Secondary | ICD-10-CM | POA: Diagnosis not present

## 2017-12-30 DIAGNOSIS — E46 Unspecified protein-calorie malnutrition: Secondary | ICD-10-CM | POA: Diagnosis not present

## 2017-12-30 DIAGNOSIS — I69351 Hemiplegia and hemiparesis following cerebral infarction affecting right dominant side: Secondary | ICD-10-CM | POA: Diagnosis not present

## 2017-12-30 DIAGNOSIS — R0989 Other specified symptoms and signs involving the circulatory and respiratory systems: Secondary | ICD-10-CM | POA: Diagnosis not present

## 2017-12-30 DIAGNOSIS — E8809 Other disorders of plasma-protein metabolism, not elsewhere classified: Secondary | ICD-10-CM

## 2017-12-30 DIAGNOSIS — G459 Transient cerebral ischemic attack, unspecified: Secondary | ICD-10-CM | POA: Diagnosis not present

## 2017-12-30 DIAGNOSIS — G8191 Hemiplegia, unspecified affecting right dominant side: Secondary | ICD-10-CM | POA: Diagnosis present

## 2017-12-30 DIAGNOSIS — R569 Unspecified convulsions: Secondary | ICD-10-CM | POA: Diagnosis not present

## 2017-12-30 DIAGNOSIS — Z7982 Long term (current) use of aspirin: Secondary | ICD-10-CM

## 2017-12-30 DIAGNOSIS — N189 Chronic kidney disease, unspecified: Secondary | ICD-10-CM | POA: Diagnosis not present

## 2017-12-30 DIAGNOSIS — E785 Hyperlipidemia, unspecified: Secondary | ICD-10-CM | POA: Diagnosis not present

## 2017-12-30 DIAGNOSIS — M2550 Pain in unspecified joint: Secondary | ICD-10-CM | POA: Diagnosis not present

## 2017-12-30 DIAGNOSIS — R0902 Hypoxemia: Secondary | ICD-10-CM | POA: Diagnosis not present

## 2017-12-30 DIAGNOSIS — Z7401 Bed confinement status: Secondary | ICD-10-CM | POA: Diagnosis not present

## 2017-12-30 DIAGNOSIS — I129 Hypertensive chronic kidney disease with stage 1 through stage 4 chronic kidney disease, or unspecified chronic kidney disease: Secondary | ICD-10-CM | POA: Diagnosis present

## 2017-12-30 DIAGNOSIS — R531 Weakness: Secondary | ICD-10-CM | POA: Diagnosis not present

## 2017-12-30 LAB — GLUCOSE, CAPILLARY
GLUCOSE-CAPILLARY: 153 mg/dL — AB (ref 70–99)
GLUCOSE-CAPILLARY: 135 mg/dL — AB (ref 70–99)
Glucose-Capillary: 133 mg/dL — ABNORMAL HIGH (ref 70–99)
Glucose-Capillary: 204 mg/dL — ABNORMAL HIGH (ref 70–99)

## 2017-12-30 MED ORDER — DOCUSATE SODIUM 100 MG PO CAPS
100.0000 mg | ORAL_CAPSULE | Freq: Every day | ORAL | Status: DC
  Administered 2017-12-31: 100 mg via ORAL
  Filled 2017-12-30: qty 1

## 2017-12-30 MED ORDER — PROCHLORPERAZINE MALEATE 5 MG PO TABS: 5.0000 mg | ORAL_TABLET | Freq: Four times a day (QID) | ORAL | Status: DC | PRN

## 2017-12-30 MED ORDER — PHENOL 1.4 % MT LIQD: 1.0000 | OROMUCOSAL | Status: DC | PRN

## 2017-12-30 MED ORDER — TRAZODONE HCL 50 MG PO TABS
25.0000 mg | ORAL_TABLET | Freq: Every evening | ORAL | Status: DC | PRN
  Administered 2018-01-06 – 2018-01-16 (×7): 50 mg via ORAL
  Filled 2017-12-30 (×8): qty 1

## 2017-12-30 MED ORDER — PANTOPRAZOLE SODIUM 40 MG PO TBEC
40.0000 mg | DELAYED_RELEASE_TABLET | Freq: Every day | ORAL | Status: DC
  Administered 2017-12-31 – 2018-01-18 (×19): 40 mg via ORAL
  Filled 2017-12-30 (×19): qty 1

## 2017-12-30 MED ORDER — PROCHLORPERAZINE EDISYLATE 10 MG/2ML IJ SOLN: 5.0000 mg | Freq: Four times a day (QID) | INTRAMUSCULAR | Status: DC | PRN

## 2017-12-30 MED ORDER — GUAIFENESIN-DM 100-10 MG/5ML PO SYRP
15.0000 mL | ORAL_SOLUTION | ORAL | Status: DC | PRN
  Administered 2018-01-09 – 2018-01-15 (×4): 15 mL via ORAL
  Filled 2017-12-30 (×5): qty 15

## 2017-12-30 MED ORDER — DIPHENHYDRAMINE HCL 12.5 MG/5ML PO ELIX: 12.5000 mg | ORAL_SOLUTION | Freq: Four times a day (QID) | ORAL | Status: DC | PRN

## 2017-12-30 MED ORDER — ACETAMINOPHEN 325 MG PO TABS
325.0000 mg | ORAL_TABLET | ORAL | Status: DC | PRN
  Administered 2018-01-04 – 2018-01-17 (×11): 650 mg via ORAL
  Filled 2017-12-30 (×11): qty 2

## 2017-12-30 MED ORDER — FLEET ENEMA 7-19 GM/118ML RE ENEM: 1.0000 | ENEMA | Freq: Once | RECTAL | Status: DC | PRN

## 2017-12-30 MED ORDER — ENOXAPARIN SODIUM 40 MG/0.4ML ~~LOC~~ SOLN
40.0000 mg | SUBCUTANEOUS | Status: DC
  Administered 2017-12-30 – 2018-01-17 (×19): 40 mg via SUBCUTANEOUS
  Filled 2017-12-30 (×20): qty 0.4

## 2017-12-30 MED ORDER — ASPIRIN EC 81 MG PO TBEC
81.0000 mg | DELAYED_RELEASE_TABLET | Freq: Every day | ORAL | Status: DC
  Administered 2017-12-31 – 2018-01-18 (×19): 81 mg via ORAL
  Filled 2017-12-30 (×20): qty 1

## 2017-12-30 MED ORDER — POLYETHYLENE GLYCOL 3350 17 G PO PACK: 17.0000 g | PACK | Freq: Every day | ORAL | Status: DC | PRN

## 2017-12-30 MED ORDER — SENNOSIDES-DOCUSATE SODIUM 8.6-50 MG PO TABS: 1.0000 | ORAL_TABLET | Freq: Every evening | ORAL | Status: DC | PRN

## 2017-12-30 MED ORDER — FINASTERIDE 5 MG PO TABS
5.0000 mg | ORAL_TABLET | Freq: Every day | ORAL | Status: DC
  Administered 2017-12-31 – 2018-01-18 (×19): 5 mg via ORAL
  Filled 2017-12-30 (×19): qty 1

## 2017-12-30 MED ORDER — LINAGLIPTIN 5 MG PO TABS
5.0000 mg | ORAL_TABLET | Freq: Every day | ORAL | Status: DC
  Administered 2017-12-31 – 2018-01-18 (×19): 5 mg via ORAL
  Filled 2017-12-30 (×19): qty 1

## 2017-12-30 MED ORDER — CLOPIDOGREL BISULFATE 75 MG PO TABS
75.0000 mg | ORAL_TABLET | Freq: Every day | ORAL | Status: DC
  Administered 2017-12-31 – 2018-01-18 (×19): 75 mg via ORAL
  Filled 2017-12-30 (×19): qty 1

## 2017-12-30 MED ORDER — GUAIFENESIN-DM 100-10 MG/5ML PO SYRP: 5.0000 mL | ORAL_SOLUTION | Freq: Four times a day (QID) | ORAL | Status: DC | PRN

## 2017-12-30 MED ORDER — ALUM & MAG HYDROXIDE-SIMETH 200-200-20 MG/5ML PO SUSP: 30.0000 mL | ORAL | Status: DC | PRN

## 2017-12-30 MED ORDER — LEVETIRACETAM 500 MG PO TABS
500.0000 mg | ORAL_TABLET | Freq: Two times a day (BID) | ORAL | Status: DC
  Administered 2017-12-30 – 2018-01-18 (×38): 500 mg via ORAL
  Filled 2017-12-30 (×38): qty 1

## 2017-12-30 MED ORDER — INSULIN ASPART 100 UNIT/ML ~~LOC~~ SOLN
0.0000 [IU] | Freq: Three times a day (TID) | SUBCUTANEOUS | Status: DC
  Administered 2017-12-30: 1 [IU] via SUBCUTANEOUS
  Administered 2017-12-31: 2 [IU] via SUBCUTANEOUS
  Administered 2017-12-31: 3 [IU] via SUBCUTANEOUS
  Administered 2017-12-31 – 2018-01-01 (×2): 1 [IU] via SUBCUTANEOUS
  Administered 2018-01-01: 2 [IU] via SUBCUTANEOUS
  Administered 2018-01-01: 1 [IU] via SUBCUTANEOUS
  Administered 2018-01-02: 2 [IU] via SUBCUTANEOUS
  Administered 2018-01-02: 1 [IU] via SUBCUTANEOUS
  Administered 2018-01-02: 2 [IU] via SUBCUTANEOUS
  Administered 2018-01-03: 1 [IU] via SUBCUTANEOUS
  Administered 2018-01-03: 2 [IU] via SUBCUTANEOUS
  Administered 2018-01-04 (×2): 1 [IU] via SUBCUTANEOUS
  Administered 2018-01-04 – 2018-01-05 (×2): 2 [IU] via SUBCUTANEOUS
  Administered 2018-01-05: 1 [IU] via SUBCUTANEOUS
  Administered 2018-01-06: 2 [IU] via SUBCUTANEOUS
  Administered 2018-01-07 – 2018-01-11 (×5): 1 [IU] via SUBCUTANEOUS
  Administered 2018-01-12: 2 [IU] via SUBCUTANEOUS
  Administered 2018-01-13 – 2018-01-17 (×5): 1 [IU] via SUBCUTANEOUS

## 2017-12-30 MED ORDER — HEPARIN SODIUM (PORCINE) 5000 UNIT/ML IJ SOLN: 5000.0000 [IU] | Freq: Three times a day (TID) | INTRAMUSCULAR | Status: DC

## 2017-12-30 MED ORDER — CLOPIDOGREL BISULFATE 75 MG PO TABS: 75.0000 mg | ORAL_TABLET | Freq: Every day | ORAL | Status: DC

## 2017-12-30 MED ORDER — TAMSULOSIN HCL 0.4 MG PO CAPS
0.4000 mg | ORAL_CAPSULE | Freq: Every day | ORAL | Status: DC
  Administered 2017-12-30 – 2018-01-17 (×19): 0.4 mg via ORAL
  Filled 2017-12-30 (×19): qty 1

## 2017-12-30 MED ORDER — PROCHLORPERAZINE 25 MG RE SUPP: 12.5000 mg | Freq: Four times a day (QID) | RECTAL | Status: DC | PRN

## 2017-12-30 MED ORDER — INSULIN ASPART 100 UNIT/ML ~~LOC~~ SOLN
0.0000 [IU] | Freq: Every day | SUBCUTANEOUS | Status: DC
  Administered 2017-12-30 – 2018-01-06 (×2): 2 [IU] via SUBCUTANEOUS

## 2017-12-30 MED ORDER — PANTOPRAZOLE SODIUM 40 MG PO TBEC: 40.0000 mg | DELAYED_RELEASE_TABLET | Freq: Every day | ORAL | Status: AC

## 2017-12-30 MED ORDER — BISACODYL 10 MG RE SUPP: 10.0000 mg | Freq: Every day | RECTAL | Status: DC | PRN

## 2017-12-30 MED ORDER — SIMVASTATIN 20 MG PO TABS
40.0000 mg | ORAL_TABLET | Freq: Every day | ORAL | Status: DC
  Administered 2017-12-31 – 2018-01-18 (×19): 40 mg via ORAL
  Filled 2017-12-30 (×20): qty 2

## 2017-12-30 NOTE — Progress Notes (Signed)
Patient refused heparin sq d/t the fact that he started bleeding from the sight when it was given to him. The patient agreed to have the SCDs on. After about an hour or less, the patient asked that the SCDs be removed. The patient was educated and understood. Will continue to monitor

## 2017-12-30 NOTE — Progress Notes (Signed)
12/30/2017 Pt transferred to 24M-03 with staff via bed.

## 2017-12-30 NOTE — Progress Notes (Signed)
PMR Admission Coordinator Pre-Admission Assessment  Patient: [name] [name] is an 83 y.o., male MRN: [id number] DOB: 09/24/1934 Height: 5\' 10"  (177.8 cm) Weight: 102.1 kg                                                                                                                                                  Insurance Information HMO: Yes    PPO:      PCP:      IPA:      80/20:      OTHER:  PRIMARY: [employer]      Policy#: [id number]      Subscriber: Patient CM Name: Darden Amber      Phone#: [phone] ext. [phone]     Fax#: [phone] for Angelica [phone] provided by Dawson Bills with [facility] Medicare on 10/14 with admission date confirmed for 12/30/17.  Pre-Cert#: [id number]      Employer:  Benefits:  Phone #: NA     Name: Availity online portal Eff. Date: 03-16-17     Deduct: $0      Out of Pocket Max: $3,400      Life Max: NA CIR: $295/day for days 1-6, $0/day for days 7+      SNF: $0/day for days 1-20, $172/day for days 21-100 (100 day limit) Outpatient: per necessity     Co-Pay: $10-40, pending outpt hospital vs office Home Health: per necessity, 100%      Co-Pay:  DME: 80%     Co-Pay: 20% Providers:  SECONDARY:       Policy#:       Subscriber:  CM Name:       Phone#:      Fax#:  Pre-Cert#:       Employer:  Benefits:  Phone #:      Name:  Eff. Date:      Deduct:       Out of Pocket Max:       Life Max:  CIR:       SNF:  Outpatient:      Co-Pay:  Home Health:       Co-Pay:  DME:      Co-Pay:   Medicaid Application Date:       Case Manager:  Disability Application Date:       Case Worker:   Emergency Contact Information         Contact Information    Name Relation Home Work Aspers Significant other [phone]  [phone]     Current Medical History  Patient Admitting Diagnosis: Multifocal infarcts  History of Present Illness: [name] [name] is an 83 year old right-handed male with history of recurrent UTI, CVA with  right-sided residual weakness,QuestionParkinson's disease, CKD stage III with latest creatinine 1.72, diabetes mellitus and seizure disorder maintained on Keppra. Per chart review and patient,  patient lives with spouse. Ambulated with a cane and still driving short distances. One level home with 7 steps to entry. Wife does assist with some ADLs. Children in the area check as needed. Presented 12/18/2017 with right-sided weakness and headache. Urine drug screen negative. Cranial CT scan reviewed, unremarkable for acute intracranial process. Per report, old left caudate lacunar infarction. Patient did not receive TPA. MRI/MRA shows multiple small acute infarcts in the left MCA territory and left occipital lobe. Acute lacunar infarction in the right centrum semi-ovale. No major branch occlusion. Echocardiogram with ejection fraction of 99% grade 1 diastolic dysfunction. Carotid Dopplers with left ICA stenosis 80 to 99%. Placed on aspirin and Plavix for CVA prophylaxis. Subcutaneous heparin for DVT prophylaxis. A urine culture 12/18/2017 greater than 100,000 E. coli completing a course of Rocephin. Vascular surgery follow-up to address symptomatic left carotid stenosis and underwent left carotid endarterectomy 12/21/2017 per Dr. Deitra Mayo. Tolerating a regular consistency diet. Therapy evaluations completed with recommendations of physical medicine rehab consult. Patient was admitted for a comprehensive rehab program on 12/30/17.  Complete NIHSS TOTAL: 0  Past Medical History      Past Medical History:  Diagnosis Date  . Arthritis   . Diabetes mellitus without complication ([place])    type 2  . Prostate disease   . Renal calculi   . Renal disorder    kidney stones  . Seizures ([place])     Family History  family history is not on file.  Prior Rehab/Hospitalizations:  Has the patient had major surgery during 100 days prior to admission? No  Current Medications     Current Facility-Administered Medications:  .   stroke: mapping our early stages of recovery book, , Does not apply, Once, Gonfa, Taye T, MD .  0.9 %  sodium chloride infusion, 500 mL, Intravenous, Once PRN, Gonfa, Taye T, MD .  0.9 %  sodium chloride infusion, , Intravenous, Continuous, Gonfa, Taye T, MD .  acetaminophen (TYLENOL) tablet 650 mg, 650 mg, Oral, Q4H PRN, 650 mg at 12/20/17 2211 **OR** acetaminophen (TYLENOL) solution 650 mg, 650 mg, Per Tube, Q4H PRN **OR** acetaminophen (TYLENOL) suppository 650 mg, 650 mg, Rectal, Q4H PRN, Gonfa, Taye T, MD .  aspirin EC tablet 81 mg, 81 mg, Oral, Daily, Gonfa, Taye T, MD, 81 mg at 12/30/17 0943 .  clopidogrel (PLAVIX) tablet 75 mg, 75 mg, Oral, Daily, Cyndia Skeeters, Taye T, MD, 75 mg at 12/30/17 0943 .  docusate sodium (COLACE) capsule 100 mg, 100 mg, Oral, Daily, Cyndia Skeeters, Taye T, MD, 100 mg at 12/30/17 0944 .  finasteride (PROSCAR) tablet 5 mg, 5 mg, Oral, Daily, Cyndia Skeeters, Taye T, MD, 5 mg at 12/30/17 0943 .  guaiFENesin-dextromethorphan (ROBITUSSIN DM) 100-10 MG/5ML syrup 15 mL, 15 mL, Oral, Q4H PRN, Gonfa, Taye T, MD .  heparin injection 5,000 Units, 5,000 Units, Subcutaneous, Q8H, Wendee Beavers T, MD, 5,000 Units at 12/30/17 0626 .  Influenza vac split quadrivalent PF (FLUZONE HIGH-DOSE) injection 0.5 mL, 0.5 mL, Intramuscular, Tomorrow-1000, Gonfa, Taye T, MD .  insulin aspart (novoLOG) injection 0-5 Units, 0-5 Units, Subcutaneous, QHS, Mercy Riding, MD, 2 Units at 12/19/17 2139 .  insulin aspart (novoLOG) injection 0-9 Units, 0-9 Units, Subcutaneous, TID WC, Mercy Riding, MD, 2 Units at 12/30/17 1204 .  levETIRAcetam (KEPPRA) tablet 500 mg, 500 mg, Oral, BID, Cyndia Skeeters, Taye T, MD, 500 mg at 12/30/17 0944 .  loperamide (IMODIUM) capsule 2 mg, 2 mg, Oral, PRN, Purohit, Shrey C, MD, 2 mg at 12/26/17  2202 .  morphine 2 MG/ML injection 2 mg, 2 mg, Intravenous, Q2H PRN, Gonfa, Taye T, MD .  ondansetron (ZOFRAN) injection 4 mg, 4 mg, Intravenous, Q6H PRN,  Gonfa, Taye T, MD .  oxyCODONE-acetaminophen (PERCOCET/ROXICET) 5-325 MG per tablet 1-2 tablet, 1-2 tablet, Oral, Q4H PRN, Gonfa, Taye T, MD .  pantoprazole (PROTONIX) EC tablet 40 mg, 40 mg, Oral, Daily, Cyndia Skeeters, Taye T, MD, 40 mg at 12/30/17 0943 .  phenol (CHLORASEPTIC) mouth spray 1 spray, 1 spray, Mouth/Throat, PRN, Gonfa, Taye T, MD .  senna-docusate (Senokot-S) tablet 1 tablet, 1 tablet, Oral, QHS PRN, Gonfa, Taye T, MD .  simvastatin (ZOCOR) tablet 40 mg, 40 mg, Oral, Daily, Cyndia Skeeters, Taye T, MD, 40 mg at 12/30/17 0944 .  tamsulosin (FLOMAX) capsule 0.4 mg, 0.4 mg, Oral, QPC supper, Cyndia Skeeters, Taye T, MD, 0.4 mg at 12/29/17 1602  Patients Current Diet:     Diet Order                  Diet heart healthy/carb modified Room service appropriate? Yes; Fluid consistency: Thin  Diet effective now               Precautions / Restrictions Precautions Precautions: Fall Precaution Comments: L ankle monitor  Restrictions Weight Bearing Restrictions: No   Has the patient had 2 or more falls or a fall with injury in the past year?No  Prior Activity Level Limited Community (1-2x/wk): drove, got out of house 3-4x/week  [place] Devices/Equipment: Radio producer (specify quad or straight), Eyeglasses, Environmental consultant (specify type)(glasses for close up, Rolator walker) Home Equipment: Environmental consultant - 2 wheels, Walker - 4 wheels, Guy - single point, Bedside commode, Shower seat, Grab bars - tub/shower  Prior Device Use: Indicate devices/aids used by the patient prior to current illness, exacerbation or injury? Walker and single point cane; mostly used cane PTA  Prior Functional Level Prior Function Level of Independence: Needs assistance Gait / Transfers Assistance Needed: ambulated with cane ADL's / Homemaking Assistance Needed: linda (wife) helps with the showering, cooks but patient reports self dresses(reports linda completes all bathing, pt holding onto  grabbar)  Self Care: Did the patient need help bathing, dressing, using the toilet or eating?  Independent with up to min/mod A for bathing per pt (pt would stand and hold grab bars as his SO would bathe him).   Indoor Mobility: Did the patient need assistance with walking from room to room (with or without device)? Independent  Stairs: Did the patient need assistance with internal or external stairs (with or without device)? Independent  Functional Cognition: Did the patient need help planning regular tasks such as shopping or remembering to take medications? Independent  Current Functional Level Cognition  Arousal/Alertness: Awake/alert Overall Cognitive Status: Within Functional Limits for tasks assessed Current Attention Level: Selective Orientation Level: Oriented X4 Following Commands: Follows one step commands with increased time, Follows one step commands consistently Safety/Judgement: Decreased awareness of safety, Decreased awareness of deficits General Comments: wfl for this session.  Pt followed all one step commands, kept attention to task in minimally distracting environment. Asking about CIR and therapy requirements Attention: Sustained Sustained Attention: Appears intact Memory: Impaired Memory Impairment: Retrieval deficit, Decreased short term memory Decreased Short Term Memory: Verbal basic Awareness: Impaired Awareness Impairment: Intellectual impairment    Extremity Assessment (includes Sensation/Coordination)  Upper Extremity Assessment: RUE deficits/detail RUE Deficits / Details: able to use as gross assist RUE Sensation: decreased light touch, decreased proprioception RUE Coordination:  decreased fine motor, decreased gross motor LUE Deficits / Details: tremor noted but pt able to use functionally LUE Sensation: WNL LUE Coordination: (has tremor which doesn't interfere)  Lower Extremity Assessment: RLE deficits/detail RLE Deficits / Details: modest  assymetrical weakness, and liited functional strength during mobility RLE Coordination: decreased gross motor    ADLs  Overall ADL's : Needs assistance/impaired Eating/Feeding: Set up, Bed level Eating/Feeding Details (indicate cue type and reason): used L hand; decreased motor control R Grooming: Wash/dry hands, Wash/dry face, Brushing hair, Sitting, Set up Grooming Details (indicate cue type and reason): primary with use of LUE Upper Body Bathing: Minimal assistance, Bed level(brought LUE over; cannot reach under arm) Lower Body Bathing: Maximal assistance, Sit to/from stand Upper Body Dressing : Moderate assistance, Sitting Lower Body Dressing: Total assistance Lower Body Dressing Details (indicate cue type and reason): for socks with +2 Mod A sit>stand (for safety) from bed (increased time), forward stooped posture Toilet Transfer: Maximal assistance, +2 for physical assistance Toilet Transfer Details (indicate cue type and reason): ambulate 5 feet to sit in recliner behind him; heavy lean to right with tendency for stooped posture Toileting- Clothing Manipulation and Hygiene: Minimal assistance, Sit to/from stand General ADL Comments: used stedy with RN assisting to get to chair; performed grooming and RUE strengthening    Mobility  Overal bed mobility: Needs Assistance Bed Mobility: Supine to Sit Rolling: Min assist Sidelying to sit: Mod assist, HOB elevated Supine to sit: Mod assist, +2 for physical assistance, HOB elevated General bed mobility comments: increased time and effort    Transfers  Overall transfer level: Needs assistance Equipment used: Rolling walker (2 wheeled) Transfer via Lift Equipment: Stedy Transfers: Sit to/from Stand Sit to Stand: Mod assist, +2 physical assistance, From elevated surface Squat pivot transfers: Max assist, +2 physical assistance General transfer comment: stedy used for transfer to chair    Ambulation / Gait / Stairs / Wheelchair  Mobility  Ambulation/Gait Ambulation/Gait assistance: +2 physical assistance, Mod assist Gait Distance (Feet): 2 Feet Assistive device: Rolling walker (2 wheeled) Gait Pattern/deviations: Step-to pattern, Decreased step length - right, Decreased step length - left, Shuffle, Wide base of support General Gait Details: Assist for balance and support. Pt with bil knees in flexion. Rt lateral lean. Assist with movement and stability of walker. Gait velocity: decr Gait velocity interpretation: <1.31 ft/sec, indicative of household ambulator    Posture / Balance Dynamic Sitting Balance Sitting balance - Comments: pt able to sit EOB without UE support in static sitting Balance Overall balance assessment: Needs assistance Sitting-balance support: Feet supported, No upper extremity supported Sitting balance-Leahy Scale: Fair Sitting balance - Comments: pt able to sit EOB without UE support in static sitting Standing balance support: Bilateral upper extremity supported Standing balance-Leahy Scale: Poor Standing balance comment: walker and mod assist for static standing    Special needs/care consideration BiPAP/CPAP: no CPM: no Continuous Drip IV: 0.9% sodium chloride infusion  Dialysis: no        Days: no Life Vest: no Oxygen: no Special Bed: pt is on seizure precautions with padded bed due to history of seizures (per pt, last one approx 1 year ago).  Trach Size: no Wound Vac (area): no      Location: no Skin: Left neck surgical incision; closed perineum incision                      Bowel mgmt: continent, 12/29/17 Bladder mgmt: external cath in place Diabetic mgmt: yes  Previous Home Environment Living Arrangements: Spouse/significant other, Children  Lives With: Spouse Available Help at Discharge: Family Type of Home: House Home Layout: One level Home Access: Stairs to enter Entrance Stairs-Rails: Can reach both Entrance Stairs-Number of Steps: 7 Bathroom Shower/Tub:  Tub/shower unit, Architectural technologist: Helix: No  Discharge Living Setting Plans for Discharge Living Setting: Patient's home, Lives with (comment), Other (Comment)(Lives with [name] (SO) & Durene Fruits (83yo M) w/ cognitive deficits) Type of Home at Discharge: House Discharge Home Layout: One level Discharge Home Access: Stairs to enter; pt reports having ramp in back entrance.  Entrance Stairs-Rails: Right, Left(only able to reach 1 at a time) Entrance Stairs-Number of Steps: 7 Discharge Bathroom Shower/Tub: Tub/shower unit(with tub transfer bench) Discharge Bathroom Toilet: Standard Discharge Bathroom Accessibility: No(may be able to side step with RW?) Does the patient have any problems obtaining your medications?: Yes (Describe)(now no longer able to drive; may need delivery services)  Social/Family/Support Systems Patient Roles: Partner(retired; has SO that cooks, cleans, and PRN assists ) Contact Information: emergency contact is [name], pt's SOI  Anticipated Caregiver: [name]: home [phone]) cell [phone]) Anticipated Caregiver's Contact Information: see above Ability/Limitations of Caregiver: Supervision/Min A transfers; up to Min/Mod bathing Caregiver Availability: 24/7 Discharge Plan Discussed with Primary Caregiver: Yes Is Caregiver In Agreement with Plan?: Yes Does Caregiver/Family have Issues with Lodging/Transportation while Pt is in Rehab?: Yes([name] is blind; unable to get transport to Rehab)   Goals/Additional Needs Patient/Family Goal for Rehab: PT: Min A; OT: Min/Mod; SLP: NA Expected length of stay: 12-18 days Cultural Considerations: Pt states he is [place]; Psychologist, forensic Dietary Needs: heart healthy, carb modified, thin liquids Equipment Needs: TBD Special Service Needs: pt prefers [facility] for [facility] therapy at DC Pt/Family Agrees to Admission and willing to participate: Yes Program Orientation Provided & Reviewed with Pt/Caregiver Including  Roles  & Responsibilities: Yes(pt and [name])  Barriers to Discharge: Inaccessible home environment, Home environment access/layout, Lack of/limited family support, Insurance for SNF coverage(minimal physical assist at home; pt unable to go to SNF)  Barriers to Discharge Comments: (legal issues prevent pt from going to SNF)   Decrease burden of Care through IP rehab admission: Decrease number of caregivers, Patient/family education. Pt unable to go to SNF due to legal issues. Pt and family aware that rehab is focused on improving his safety prior to returning home but that pt may still require physical assistance at DC; pt's significant other unable to provide much physical assist but aware CIR is his only option for intensive rehab at this time prior to returning home. Pt also explicitly refuses placement besides home after CIR, regardless of assistance recommended at [place] is for pt to return home after CIR stay with Home Health (pt has used [facility] prior). Would anticipate pt will need [place] aide. Pt has Primary Care MD that makes house calls and the VP Care Coordinator through [facility] continues to search for more resources through the pt's primary care office to see if he would qualify for SW/CM at DC.   Possible need for SNF placement upon discharge: Pt unable to be admitted to SNF due to legal issues. SNF is not an option per case management. Pt has adamantly refused any placement bedsides home after CIR, regardless of assistance recommended at DC.    Patient Condition: This patient's medical and functional status has changed since the consult dated 12/20/17 in which the Rehabilitation Physician documented that the patient was not appropriate for  intensive rehabilitative care in an inpatient rehabilitation facility. Since consult, it has been disclosed that pt is unable to be admitted to SNF, the recommended discharge disposition, due to legal issues and thus pt would have to  DC home straight from acute with high fall risk and need for two person assist for transfers. In order to reduce burden of care on pt's significant other and reduction in fall risk, pt is to be admitted to [facility] for short stay prior to returning home with anticipated home health therapy. Due to change in status and issues being addressed, patient's case has been discussed with Dr Naaman Plummer and patient now appropriate for inpatient rehabilitation. Will admit to inpatient rehab today.   Preadmission Screen Completed By:  Jhonnie Garner, 12/30/2017 1:22 PM ______________________________________________________________________   Discussed status with Dr. Naaman Plummer on 12/28/17 and 12/29/17 as well as on 12/30/17  at 1:24 PM and received telephone approval for admission today.  Admission Coordinator:  Jhonnie Garner, time 1:24PM/Date 12/30/17      Revision History    Date/Time User Provider Type Action  12/30/2017 2:29 PM [name], MD Physician Addend  12/30/2017 2:29 PM [name], MD Physician Cosign  12/30/2017 2:10 PM Jhonnie Garner, [place] [facility] Admission Coordinator Sign  View Details Report

## 2017-12-30 NOTE — Progress Notes (Signed)
Physical Therapy Treatment Patient Details Name: [name] [name] MRN: [id number] DOB: Jul 24, 1934 Today's Date: 12/30/2017    History of Present Illness Pt is a 83 y.o. male with Right weakness and HA  with multiple small acute infarcts in the left MCA and left occipital lobe, acute lacunar infarct in right centrum semiovale, significant left ICA stenosis s/p L CEA 10/8. PMHx: recurrent UTIs, seizures, CVA, Parkinson disease, hypertension, CKD stage III, DM    PT Comments    Patient with noted progress today in therapy session. Focused on bed mobility, transfer training, and strengthening exercises. Pt able to perform rolling without physical assistance and requiring moderate assistance to elevate trunk to sitting edge of bed. Able to stand with walker with two person maximal assistance and achieve upright positioning. Transferred with Stedy to chair for safety. Pt discharged to CIR.    Follow Up Recommendations  CIR;Supervision/Assistance - 24 hour     Equipment Recommendations  3in1 (PT)    Recommendations for Other Services Rehab consult     Precautions / Restrictions Precautions Precautions: Fall Precaution Comments: L ankle monitor  Restrictions Weight Bearing Restrictions: No    Mobility  Bed Mobility Overal bed mobility: Needs Assistance Bed Mobility: Rolling;Supine to Sit Rolling: Supervision Sidelying to sit: Mod assist;HOB elevated       General bed mobility comments: Patient able to roll and progress BLE's off of bed with physical assistance. moderate assistance provided to elevate trunk with cues for hand positioning  Transfers Overall transfer level: Needs assistance Equipment used: Rolling walker (2 wheeled);Ambulation equipment used Transfers: Sit to/from Stand Sit to Stand: +2 physical assistance;From elevated surface;Max assist;Mod assist         General transfer comment: Trialed stand from elevated bed surface to walker. Patient able to achieve  upright positioning with maxA + 2 and cues for hip extension. Transferred to chair with Charlaine Dalton for safety  Ambulation/Gait                 Stairs             Wheelchair Mobility    Modified Rankin (Stroke Patients Only)       Balance Overall balance assessment: Needs assistance Sitting-balance support: Feet supported;No upper extremity supported Sitting balance-Leahy Scale: Fair     Standing balance support: Bilateral upper extremity supported Standing balance-Leahy Scale: Poor                              Cognition Arousal/Alertness: Awake/alert Behavior During Therapy: WFL for tasks assessed/performed Overall Cognitive Status: Within Functional Limits for tasks assessed                                        Exercises General Exercises - Upper Extremity Elbow Flexion: AAROM;Right;10 reps;Seated(decreased motor control) Elbow Extension: 10 reps;Other (comment);Seated(gravity eliminated) General Exercises - Lower Extremity Long Arc Quad: 10 reps;Both;Seated Hip ABduction/ADduction: 10 reps;Seated(with pillow squeeze) Hip Flexion/Marching: 10 reps;Both;Seated Other Exercises Other Exercises: Seated: AAROM RUE D2 extension     General Comments        Pertinent Vitals/Pain Pain Assessment: Faces Faces Pain Scale: No hurt    Home Living                      Prior Function  PT Goals (current goals can now be found in the care plan section) Acute Rehab PT Goals Patient Stated Goal: "do whatever I need to do." Potential to Achieve Goals: Good Progress towards PT goals: Progressing toward goals    Frequency    Min 3X/week      PT Plan Current plan remains appropriate    Co-evaluation              AM-PAC PT "6 Clicks" Daily Activity  Outcome Measure  Difficulty turning over in bed (including adjusting bedclothes, sheets and blankets)?: A Lot Difficulty moving from lying on back to  sitting on the side of the bed? : Unable Difficulty sitting down on and standing up from a chair with arms (e.g., wheelchair, bedside commode, etc,.)?: Unable Help needed moving to and from a bed to chair (including a wheelchair)?: A Lot Help needed walking in hospital room?: Total Help needed climbing 3-5 steps with a railing? : Total 6 Click Score: 8    End of Session Equipment Utilized During Treatment: Gait belt Activity Tolerance: Patient tolerated treatment well Patient left: in chair;with call bell/phone within reach Nurse Communication: Mobility status PT Visit Diagnosis: Other symptoms and signs involving the nervous system (R29.898);Other abnormalities of gait and mobility (R26.89);Unsteadiness on feet (R26.81)     Time: 1340-1404 PT Time Calculation (min) (ACUTE ONLY): 24 min  Charges:  $Therapeutic Exercise: 8-22 mins $Therapeutic Activity: 8-22 mins                    Ellamae Sia, PT, DPT Acute Rehabilitation Services Pager [phone] Office [phone]   Willy Eddy 12/30/2017, 4:04 PM

## 2017-12-30 NOTE — Plan of Care (Signed)
  Problem: Consults Goal: RH STROKE PATIENT EDUCATION Description See Patient Education module for education specifics  Outcome: Not Progressing Goal: Nutrition Consult-if indicated Outcome: Not Progressing   Problem: RH BOWEL ELIMINATION Goal: RH STG MANAGE BOWEL WITH ASSISTANCE Description STG Manage Bowel with mod Assistance.  Outcome: Not Progressing Goal: RH STG MANAGE BOWEL W/MEDICATION W/ASSISTANCE Description STG Manage Bowel with Medication with mod Assistance.  Outcome: Not Progressing   Problem: RH BLADDER ELIMINATION Goal: RH STG MANAGE BLADDER WITH ASSISTANCE Description STG Manage Bladder With mod Assistance  Outcome: Not Progressing Goal: RH STG MANAGE BLADDER WITH MEDICATION WITH ASSISTANCE Description STG Manage Bladder With Medication With mod Assistance.  Outcome: Not Progressing Goal: RH STG MANAGE BLADDER WITH EQUIPMENT WITH ASSISTANCE Description STG Manage Bladder With Equipment With mod Assistance  Outcome: Not Progressing   Problem: RH SKIN INTEGRITY Goal: RH STG SKIN FREE OF INFECTION/BREAKDOWN Outcome: Not Progressing Goal: RH STG MAINTAIN SKIN INTEGRITY WITH ASSISTANCE Description STG Maintain Skin Integrity With mod Assistance.  Outcome: Not Progressing Goal: RH STG ABLE TO PERFORM INCISION/WOUND CARE W/ASSISTANCE Description STG Able To Perform Incision/Wound Care With mod Assistance.  Outcome: Not Progressing   Problem: RH SAFETY Goal: RH STG ADHERE TO SAFETY PRECAUTIONS W/ASSISTANCE/DEVICE Description STG Adhere to Safety Precautions With mod Assistance/Device.  Outcome: Not Progressing Goal: RH STG DECREASED RISK OF FALL WITH ASSISTANCE Description STG Decreased Risk of Fall With mod  Assistance.  Outcome: Not Progressing   Problem: RH COGNITION-NURSING Goal: RH STG USES MEMORY AIDS/STRATEGIES W/ASSIST TO PROBLEM SOLVE Description STG Uses Memory Aids/Strategies With mod Assistance to Problem Solve.  Outcome: Not  Progressing Goal: RH STG ANTICIPATES NEEDS/CALLS FOR ASSIST W/ASSIST/CUES Description STG Anticipates Needs/Calls for Assist With mod Assistance/Cues.  Outcome: Not Progressing   Problem: RH PAIN MANAGEMENT Goal: RH STG PAIN MANAGED AT OR BELOW PT'S PAIN GOAL Outcome: Not Progressing   Problem: RH KNOWLEDGE DEFICIT Goal: RH STG INCREASE KNOWLEDGE OF DIABETES Outcome: Not Progressing Goal: RH STG INCREASE KNOWLEDGE OF HYPERTENSION Outcome: Not Progressing Goal: RH STG INCREASE KNOWLEDGE OF STROKE PROPHYLAXIS Outcome: Not Progressing   Problem: RH Vision Goal: RH LTG Vision (Specify) Outcome: Not Progressing  New admit

## 2017-12-30 NOTE — Care Management Important Message (Signed)
Important Message  Patient Details  Name: [name] [name] MRN: [id number] Date of Birth: December 18, 1934   Medicare Important Message Given:  Yes    Mykela Mewborn P Navina Wohlers 12/30/2017, 11:44 AM

## 2017-12-30 NOTE — H&P (Signed)
Physical Medicine and Rehabilitation Admission H&P       Chief Complaint  Patient presents with  .  Stroke with functional deficits    HPI: [name] [name] is an 83 year old RH-male with history of seizure disorder, T2DM, LUE tremors, recurrent UTIs, questionable TIA event last year; who was admitted on 12/18/2017 with right-sided weakness, dizziness and numbness.  MRI/MRA brain showed multiple small acute infarct in left MCA territory and left occipital lobe, acute lacunar infarct right centrum semiovale and moderate left P2 and possible left A1 stenosis.  MRI cervical spine showed moderate DDD most severe C5/C6 with neural foraminal stenosis.  2D echo showed EF of 50 to 55% with moderate concentric hypertrophy, akinesis of input septal myocardium and no SOE.  Carotid Dopplers done revealing left 80 to 99% ICA stenosis.   Dr. Doren Custard consulted for input and recommended left carotid endarterectomy to reduce risk of further strokes. Dr Annette Stable consulted for input and did not feel that surgical intervention needed at this time.  He underwent left CEA on 12/21/2017 and to continue DAPT for 3 weeks then Plavix alone.  Patient with deficits in mobility and self-care tasks.  CIR recommended for follow-up therapy   Review of Systems  Constitutional: Negative for chills and fever.  HENT: Negative for hearing loss and tinnitus.   Eyes: Negative for blurred vision and double vision.  Respiratory: Negative for cough and shortness of breath.   Cardiovascular: Positive for leg swelling. Negative for chest pain and palpitations.  Gastrointestinal: Negative for abdominal pain, constipation and heartburn.  Genitourinary: Negative for dysuria and urgency.  Musculoskeletal: Negative for myalgias and neck pain.  Neurological: Positive for tremors (resting tremor LUE). Negative for dizziness and headaches.  Psychiatric/Behavioral: Negative for memory loss. The patient is not nervous/anxious.           Past Medical History:  Diagnosis Date  . Arthritis   . Diabetes mellitus without complication ([place])    type 2  . Prostate disease   . Renal calculi   . Renal disorder    kidney stones  . Seizures ([place])          Past Surgical History:  Procedure Laterality Date  . CYSTOSCOPY W/ URETERAL STENT PLACEMENT Bilateral 08/10/2015   Procedure: CYSTOSCOPY WITH BILATERAL RETROGRADE PYELOGRAM/BILATERAL URETERAL STENT PLACEMENT;  Surgeon: Kathie Rhodes, MD;  Location: [facility];  Service: Urology;  Laterality: Bilateral;  . CYSTOSCOPY WITH RETROGRADE PYELOGRAM, URETEROSCOPY AND STENT PLACEMENT Bilateral 09/02/2015   Procedure: CYSTOSCOPY BILATERAL STENT REMOVAL BILATERAL RETROGRADE PYELOGRAM, BILATERAL URETEROSCOPY AND BILATERAL STENT PLACEMENT;  Surgeon: Kathie Rhodes, MD;  Location: [facility];  Service: Urology;  Laterality: Bilateral;  . ENDARTERECTOMY Left 12/21/2017   Procedure: Left ENDARTERECTOMY CAROTID;  Surgeon: Angelia Mould, MD;  Location: [facility];  Service: Vascular;  Laterality: Left;  . HOLMIUM LASER APPLICATION Bilateral 1/61/0960   Procedure: BILATERAL HOLMIUM LASER  LITHO ;  Surgeon: Kathie Rhodes, MD;  Location: [facility];  Service: Urology;  Laterality: Bilateral;  . JOINT REPLACEMENT Right 2007   hip, Dr. Alvan Dame  . PATCH ANGIOPLASTY Left 12/21/2017   Procedure: PATCH ANGIOPLASTY;  Surgeon: Angelia Mould, MD;  [facility];  Service: Vascular;  Laterality: Left;  . TONSILLECTOMY Bilateral    70 years ago         Family History  Problem Relation Age of Onset  . Early death Neg Hx   . Kidney disease Neg Hx     Social History: Married.  Wife is blind but independent. He was independent PTA. He  reports that he has never smoked. He has never used smokeless tobacco. He reports that he does not drink alcohol or use drugs.    Allergies: No Known Allergies          Medications Prior to Admission  Medication Sig Dispense Refill  . aspirin 81  MG tablet Take 81 mg by mouth daily.     . finasteride (PROSCAR) 5 MG tablet Take 5 mg by mouth daily.    Marland Kitchen ibuprofen (ADVIL,MOTRIN) 200 MG tablet Take 200 mg by mouth every 6 (six) hours as needed for headache or mild pain.    Marland Kitchen levETIRAcetam (KEPPRA) 500 MG tablet Take 1 tablet (500 mg total) by mouth 2 (two) times daily. 60 tablet 0  . metFORMIN (GLUCOPHAGE) 1000 MG tablet Take 1 tablet (1,000 mg total) by mouth 2 (two) times daily with a meal. 15 tablet 0  . simvastatin (ZOCOR) 20 MG tablet Take 20 mg by mouth daily.     . tamsulosin (FLOMAX) 0.4 MG CAPS capsule Take 1 capsule (0.4 mg total) by mouth daily after supper. (Patient taking differently: Take 0.4 mg by mouth daily. ) 30 capsule 0  . sitaGLIPtin (JANUVIA) 50 MG tablet Take 1 tablet (50 mg total) by mouth daily. (Patient not taking: Reported on 12/18/2017) 10 tablet 0    Drug Regimen Review  Drug regimen was reviewed and remains appropriate with no significant issues identified  Home: Home Living Family/patient expects to be discharged to:: Private residence Living Arrangements: Spouse/significant other, Children Available Help at Discharge: Family Type of Home: House Home Access: Stairs to enter Technical brewer of Steps: 7 Entrance Stairs-Rails: Can reach both Home Layout: One level Bathroom Shower/Tub: Tub/shower unit, Architectural technologist: Standard Home Equipment: Environmental consultant - 2 wheels, Environmental consultant - 4 wheels, [place] - single point, Bedside commode, Shower seat, Grab bars - tub/shower  Lives With: Spouse   Functional History: Prior Function Level of Independence: Needs assistance Gait / Transfers Assistance Needed: ambulated with cane ADL's / Homemaking Assistance Needed: linda (wife) helps with the showering, cooks but patient reports self dresses(reports linda completes all bathing, pt holding onto grabbar)  Functional Status:  Mobility: Bed Mobility Overal bed mobility: Needs Assistance Bed Mobility:  Supine to Sit Rolling: Min assist Sidelying to sit: Mod assist, HOB elevated Supine to sit: Mod assist, +2 for physical assistance, HOB elevated General bed mobility comments: increased time and effort Transfers Overall transfer level: Needs assistance Equipment used: Rolling walker (2 wheeled) Transfer via Lift Equipment: Stedy Transfers: Sit to/from Stand Sit to Stand: Mod assist, +2 physical assistance, From elevated surface Squat pivot transfers: Max assist, +2 physical assistance General transfer comment: stedy used for transfer to chair Ambulation/Gait Ambulation/Gait assistance: +2 physical assistance, Mod assist Gait Distance (Feet): 2 Feet Assistive device: Rolling walker (2 wheeled) Gait Pattern/deviations: Step-to pattern, Decreased step length - right, Decreased step length - left, Shuffle, Wide base of support General Gait Details: Assist for balance and support. Pt with bil knees in flexion. Rt lateral lean. Assist with movement and stability of walker. Gait velocity: decr Gait velocity interpretation: <1.31 ft/sec, indicative of household ambulator  ADL: ADL Overall ADL's : Needs assistance/impaired Eating/Feeding: Set up, Bed level Eating/Feeding Details (indicate cue type and reason): used L hand; decreased motor control R Grooming: Wash/dry hands, Wash/dry face, Brushing hair, Sitting, Set up Grooming Details (indicate cue type and reason): primary with use of LUE Upper Body Bathing: Minimal  assistance, Bed level(brought LUE over; cannot reach under arm) Lower Body Bathing: Maximal assistance, Sit to/from stand Upper Body Dressing : Moderate assistance, Sitting Lower Body Dressing: Total assistance Lower Body Dressing Details (indicate cue type and reason): for socks with +2 Mod A sit>stand (for safety) from bed (increased time), forward stooped posture Toilet Transfer: Maximal assistance, +2 for physical assistance Toilet Transfer Details (indicate cue type and  reason): ambulate 5 feet to sit in recliner behind him; heavy lean to right with tendency for stooped posture Toileting- Clothing Manipulation and Hygiene: Minimal assistance, Sit to/from stand General ADL Comments: used stedy with RN assisting to get to chair; performed grooming and RUE strengthening  Cognition: Cognition Overall Cognitive Status: Within Functional Limits for tasks assessed Arousal/Alertness: Awake/alert Orientation Level: Oriented X4 Attention: Sustained Sustained Attention: Appears intact Memory: Impaired Memory Impairment: Retrieval deficit, Decreased short term memory Decreased Short Term Memory: Verbal basic Awareness: Impaired Awareness Impairment: Intellectual impairment Cognition Arousal/Alertness: Awake/alert Behavior During Therapy: WFL for tasks assessed/performed Overall Cognitive Status: Within Functional Limits for tasks assessed Area of Impairment: Safety/judgement, Problem solving, Following commands, Awareness Current Attention Level: Selective Memory: Decreased short-term memory Following Commands: Follows one step commands with increased time, Follows one step commands consistently Safety/Judgement: Decreased awareness of safety, Decreased awareness of deficits Awareness: Emergent Problem Solving: Slow processing, Requires verbal cues, Requires tactile cues General Comments: wfl for this session.  Pt followed all one step commands, kept attention to task in minimally distracting environment. Asking about CIR and therapy requirements   Blood pressure (!) 143/70, pulse 72, temperature 98.2 F (36.8 C), temperature source Oral, resp. rate 18, height 5\' 10"  (1.778 m), weight 102.1 kg, SpO2 93 %. Physical Exam  Nursing note and vitals reviewed. Constitutional: He is oriented to person, place, and time. No distress.  HENT:  Head: Normocephalic.  Eyes: Pupils are equal, round, and reactive to light.  Neck: Normal range of motion.  Left neck  incision C/D/I with min edema.   Cardiovascular: Normal rate. Exam reveals no friction rub.  Murmur heard. Respiratory: Effort normal. No respiratory distress. He has no wheezes. He has no rales.  GI: Soft. He exhibits no distension. There is no tenderness.  Musculoskeletal: He exhibits edema (min edema bilateral hands and feet.).  Neurological: He is alert and oriented to person, place, and time. He displays normal reflexes. No cranial nerve deficit. Coordination abnormal.  Speech clear. Able to follow basic commands without difficulty. RUE 3/5 prox to 4-/5 distally. LUE 4+/5. RLE 3+ to 4/5 prox to distal. LLE 4+/5. Appears to sense LT in all 4's. Reasonable insight and awareness  Skin: Skin is warm and dry. He is not diaphoretic.  Psychiatric: He has a normal mood and affect. His behavior is normal.    LabResultsLast48Hours       Results for orders placed or performed during the hospital encounter of 12/18/17 (from the past 48 hour(s))  Glucose, capillary     Status: Abnormal   Collection Time: 12/28/17  4:18 PM  Result Value Ref Range   Glucose-Capillary 151 (H) 70 - 99 mg/dL   Comment 1 Notify RN   Glucose, capillary     Status: Abnormal   Collection Time: 12/28/17  8:49 PM  Result Value Ref Range   Glucose-Capillary 169 (H) 70 - 99 mg/dL  Glucose, capillary     Status: Abnormal   Collection Time: 12/29/17  5:38 AM  Result Value Ref Range   Glucose-Capillary 143 (H) 70 - 99 mg/dL  Glucose, capillary     Status: Abnormal   Collection Time: 12/29/17 11:17 AM  Result Value Ref Range   Glucose-Capillary 165 (H) 70 - 99 mg/dL   Comment 1 Notify RN    Comment 2 Document in Chart   Glucose, capillary     Status: Abnormal   Collection Time: 12/29/17  4:00 PM  Result Value Ref Range   Glucose-Capillary 175 (H) 70 - 99 mg/dL   Comment 1 Notify RN    Comment 2 Document in Chart   Glucose, capillary     Status: Abnormal   Collection Time: 12/29/17  9:20 PM    Result Value Ref Range   Glucose-Capillary 167 (H) 70 - 99 mg/dL   Comment 1 Notify RN    Comment 2 Document in Chart   Glucose, capillary     Status: Abnormal   Collection Time: 12/30/17  6:19 AM  Result Value Ref Range   Glucose-Capillary 133 (H) 70 - 99 mg/dL   Comment 1 Notify RN    Comment 2 Document in Chart   Glucose, capillary     Status: Abnormal   Collection Time: 12/30/17 11:28 AM  Result Value Ref Range   Glucose-Capillary 153 (H) 70 - 99 mg/dL   Comment 1 Notify RN      ImagingResults(Last48hours)  No results found.       Medical Problem List and Plan: 1.  Right HP and functional deficits secondary to left > right bi-cerebral infarcts             -admit to inpatient rehab 2.  DVT Prophylaxis/Anticoagulation: Pharmaceutical: Heparin 3. Pain Management: N/A 4. Mood: LCSW to follow for evaluation and support.  5. Neuropsych: This patient is capable of making decisions on his own behalf. 6. Skin/Wound Care: Monitor wound daily for healing.  Maintain adequate nutritional and hydration status  7. Fluids/Electrolytes/Nutrition: Monitor I's and O's.  Check labs in a.m. 8.  S/P Left CEA: DAPT x3 weeks followed by Plavix alone.  Continue statin 9.  CKD: Continue to monitor with serial checks. Encourage fluid intake.   10.  Seizure disorder: Stable on Keppra twice daily 11.  T2DM: Hgb Aic- 7.2.  Was on metformin and Januvia at home prior to [employer].               -Continue metformin due to baseline serum creatinine 1.7             - consider Amaryl..               -Will continue to monitor blood sugars AC/HS basis.  Use sliding scale insulin for tighter blood sugar control. 12.  BPH: Continue Flomax and Proscar. 13.  E coli UTI: Completed treatment 10/10             -dc condom cath, voiding trial.    Post Admission Physician Evaluation: 1. Functional deficits secondary  to bi-cerebral infarcts. 2. Patient is admitted to  receive collaborative, interdisciplinary care between the physiatrist, rehab nursing staff, and therapy team. 3. Patient's level of medical complexity and substantial therapy needs in context of that medical necessity cannot be provided at a lesser intensity of care such as a SNF. 4. Patient has experienced substantial functional loss from his/her baseline which was documented above under the "Functional History" and "Functional Status" headings.  Judging by the patient's diagnosis, physical exam, and functional history, the patient has potential for functional progress which will result in measurable gains while on inpatient  rehab.  These gains will be of substantial and practical use upon discharge  in facilitating mobility and self-care at the household level. 5. Physiatrist will provide 24 hour management of medical needs as well as oversight of the therapy plan/treatment and provide guidance as appropriate regarding the interaction of the two. 6. The Preadmission Screening has been reviewed and patient status is unchanged unless otherwise stated above. 7. 24 hour rehab nursing will assist with bladder management, bowel management, safety, skin/wound care, disease management, medication administration and patient education  and help integrate therapy concepts, techniques,education, etc. 8. PT will assess and treat for/with: Lower extremity strength, range of motion, stamina, balance, functional mobility, safety, adaptive techniques and equipment, NMR, home safety evaluation, ego support, community reentry.   Goals are: supervision to min assist. 9. OT will assess and treat for/with: ADL's, functional mobility, safety, upper extremity strength, adaptive techniques and equipment, NMR, community reentry, family ed.   Goals are: min assist. Therapy may proceed with showering this patient. 10. SLP will assess and treat for/with: n/a .  Goals are: n/a. 11. Case Management and Social Worker will assess and  treat for psychological issues and discharge planning. 12. Team conference will be held weekly to assess progress toward goals and to determine barriers to discharge. 13. Patient will receive at least 3 hours of therapy per day at least 5 days per week. 14. ELOS: 12-18 days       15. Prognosis:  excellent   I have personally performed a face to face diagnostic evaluation of this patient and formulated the key components of the plan.  Additionally, I have personally reviewed laboratory data, imaging studies, as well as relevant notes and concur with the physician assistant's documentation above.  [name], MD, Mellody Drown    Bary Leriche, PA-C 12/30/2017  The patient's status has not changed. The original post admission physician evaluation remains appropriate, and any changes from the pre-admission screening or documentation from the acute chart are noted above.  [name], MD 12/30/2017

## 2017-12-30 NOTE — Progress Notes (Signed)
Physical Medicine and Rehabilitation Consult Reason for Consult: Right side weakness Referring Physician: [facility]   HPI: [name] [name] is a 83 y.o. right-handed male with history of recurrent UTI, CVA, Parkinson's disease, CKD stage III, diabetes mellitus and seizure disorder maintained on Keppra.  Per chart review and patient, patient lives with spouse.  Ambulated with a cane.  One level home with 7 steps to entry.  Wife does with some simple ADLs.  Children in the area check as needed.  Presented 12/18/2017 with right-sided weakness and headache.  Cranial CT scan reviewed, unremarkable for acute intracranial process. Per report, old left caudate lacunar infarction.  Patient did not receive TPA.  MRI/MRA shows multiple small acute infarcts in the left MCA territory and left occipital lobe.  Acute lacunar infarction in the right centrum semi-ovale.  No major branch occlusion.  Echocardiogram with ejection fraction of 62% grade 1 diastolic dysfunction.  Carotid Dopplers with left ICA stenosis 80 to 99%.  Vascular surgery Dr. Deitra Mayo consulted in regards to ICA stenosis planning for carotid enterectomy.  Currently maintained on aspirin for CVA prophylaxis.  Subcutaneous heparin for DVT prophylaxis.  Urine culture 12/18/2017 greater than 100,000 E. coli maintained on Rocephin.  Therapy evaluations completed with recommendations of physical medicine rehab consult.   Review of Systems  Constitutional: Negative for chills and fever.  HENT: Negative for hearing loss.   Eyes: Negative for blurred vision and double vision.  Respiratory: Negative for shortness of breath.   Cardiovascular: Negative for chest pain, palpitations and leg swelling.  Gastrointestinal: Positive for constipation. Negative for nausea.  Genitourinary: Positive for urgency. Negative for dysuria and hematuria.  Musculoskeletal: Positive for myalgias.  Skin: Negative for rash.  Neurological: Positive for sensory change, focal  weakness, seizures and headaches.  All other systems reviewed and are negative.      Past Medical History:  Diagnosis Date  . Arthritis   . Diabetes mellitus without complication ([place])    type 2  . Prostate disease   . Renal disorder    kidney stones  . Seizures ([place])         Past Surgical History:  Procedure Laterality Date  . CYSTOSCOPY W/ URETERAL STENT PLACEMENT Bilateral 08/10/2015   Procedure: CYSTOSCOPY WITH BILATERAL RETROGRADE PYELOGRAM/BILATERAL URETERAL STENT PLACEMENT;  Surgeon: Kathie Rhodes, MD;  Location: [facility];  Service: Urology;  Laterality: Bilateral;  . CYSTOSCOPY WITH RETROGRADE PYELOGRAM, URETEROSCOPY AND STENT PLACEMENT Bilateral 09/02/2015   Procedure: CYSTOSCOPY BILATERAL STENT REMOVAL BILATERAL RETROGRADE PYELOGRAM, BILATERAL URETEROSCOPY AND BILATERAL STENT PLACEMENT;  Surgeon: Kathie Rhodes, MD;  Location: [facility];  Service: Urology;  Laterality: Bilateral;  . HOLMIUM LASER APPLICATION Bilateral 3/76/2831   Procedure: BILATERAL HOLMIUM LASER  LITHO ;  Surgeon: Kathie Rhodes, MD;  Location: [facility];  Service: Urology;  Laterality: Bilateral;  . JOINT REPLACEMENT Right 2007   hip, Dr. Alvan Dame  . TONSILLECTOMY Bilateral    70 years ago   Family History  Problem Relation Age of Onset  . Early death Neg Hx   . Kidney disease Neg Hx    Social History:  reports that he has never smoked. He has never used smokeless tobacco. He reports that he does not drink alcohol or use drugs. Allergies: No Known Allergies       Medications Prior to Admission  Medication Sig Dispense Refill  . aspirin 81 MG tablet Take 81 mg by mouth daily.     . finasteride (PROSCAR) 5 MG tablet Take 5 mg by  mouth daily.    Marland Kitchen ibuprofen (ADVIL,MOTRIN) 200 MG tablet Take 200 mg by mouth every 6 (six) hours as needed for headache or mild pain.    Marland Kitchen levETIRAcetam (KEPPRA) 500 MG tablet Take 1 tablet (500 mg total) by mouth 2 (two) times daily. 60 tablet 0  . metFORMIN  (GLUCOPHAGE) 1000 MG tablet Take 1 tablet (1,000 mg total) by mouth 2 (two) times daily with a meal. 15 tablet 0  . simvastatin (ZOCOR) 20 MG tablet Take 20 mg by mouth daily.     . tamsulosin (FLOMAX) 0.4 MG CAPS capsule Take 1 capsule (0.4 mg total) by mouth daily after supper. (Patient taking differently: Take 0.4 mg by mouth daily. ) 30 capsule 0  . sitaGLIPtin (JANUVIA) 50 MG tablet Take 1 tablet (50 mg total) by mouth daily. (Patient not taking: Reported on 12/18/2017) 10 tablet 0    Home: Home Living Family/patient expects to be discharged to:: Private residence Living Arrangements: Spouse/significant other, Children Available Help at Discharge: Family Type of Home: House Home Access: Stairs to enter Technical brewer of Steps: 7 Entrance Stairs-Rails: Can reach both Home Layout: One level Bathroom Shower/Tub: Tub/shower unit, Architectural technologist: Standard Home Equipment: Environmental consultant - 2 wheels, Environmental consultant - 4 wheels, [place] - single point, Bedside commode, Shower seat, Grab bars - tub/shower  Functional History: Prior Function Level of Independence: Needs assistance Gait / Transfers Assistance Needed: ambulated with cane ADL's / Homemaking Assistance Needed: linda (wife) helps with the showering, cooks but patient reports self dresses(reports linda completes all bathing, pt holding onto grabbar) Functional Status:  Mobility: Bed Mobility Overal bed mobility: Needs Assistance Bed Mobility: Supine to Sit Supine to sit: Mod assist, HOB elevated General bed mobility comments: mod assist for trunk support into sitting at EOB, verbal cueing to reach and use rails  Transfers Overall transfer level: Needs assistance Equipment used: 1 person hand held assist Transfers: Sit to/from Stand Sit to Stand: Min guard, +2 safety/equipment General transfer comment: cueing for hand placement and safety, reliant on UE support  and increased time and effort to  perform Ambulation/Gait Ambulation/Gait assistance: Mod assist Gait Distance (Feet): 12 Feet Assistive device: 1 person hand held assist Gait Pattern/deviations: Decreased stride length, Shuffle, Trunk flexed, Wide base of support General Gait Details: patient with difficulty establishing gait. Noted instability requiring moderate HHA to maintain upright and pacing. Patient reaching out for upright support of opposite extremity Gait velocity: decreased Gait velocity interpretation: <1.31 ft/sec, indicative of household ambulator  ADL: ADL Overall ADL's : Needs assistance/impaired Grooming: Minimal assistance, Standing, Sitting Upper Body Bathing: Moderate assistance, Sitting Lower Body Bathing: Maximal assistance, Sit to/from stand Upper Body Dressing : Moderate assistance, Sitting Lower Body Dressing: Total assistance, Sit to/from stand Toilet Transfer: Min guard, +2 for safety/equipment, Ambulation(simulated to recliner, hand held assist) Toileting- Clothing Manipulation and Hygiene: Minimal assistance, Sit to/from stand General ADL Comments: Pt with slow processing, initation and sequencing. Limited by decreased coordination and strength of R dominant UE.   Cognition: Cognition Overall Cognitive Status: Impaired/Different from baseline Orientation Level: Oriented X4 Cognition Arousal/Alertness: Awake/alert Behavior During Therapy: WFL for tasks assessed/performed Overall Cognitive Status: Impaired/Different from baseline Area of Impairment: Attention, Memory, Following commands, Safety/judgement, Awareness, Problem solving Current Attention Level: Selective Memory: Decreased short-term memory Following Commands: Follows one step commands consistently, Follows one step commands with increased time Safety/Judgement: Decreased awareness of safety, Decreased awareness of deficits Awareness: Emergent Problem Solving: Slow processing, Decreased initiation, Difficulty sequencing,  Requires verbal cues  Blood pressure (!) 129/103, pulse 68, temperature 98.3 F (36.8 C), temperature source Oral, resp. rate 18, height 5\' 10"  (1.778 m), weight 102.1 kg, SpO2 93 %. Physical Exam  Vitals reviewed. Constitutional: He is oriented to person, place, and time. He appears well-developed.  Obese  HENT:  Head: Normocephalic and atraumatic.  Eyes: EOM are normal. Right eye exhibits no discharge. Left eye exhibits no discharge.  Neck: Normal range of motion. Neck supple. No thyromegaly present.  Cardiovascular: Normal rate, regular rhythm and normal heart sounds.  Respiratory: Effort normal and breath sounds normal. No respiratory distress.  GI: Soft. Bowel sounds are normal. He exhibits no distension.  Musculoskeletal:  No edema or tenderness in extremities  Neurological: He is alert and oriented to person, place, and time.  Follows basic commands Multiple right upper extremity: 3+/5 proximal to distal with apraxia Left upper extremity: 4/5 proximal distal Right lobe: 3+/5 proximal distal Left lower extremity: 3+-4-/5 proximal distal Sensation diminished to light touch RUE/RLE LUE resting tremor  Skin: Skin is warm and dry.  Psychiatric: His speech is slurred. He is slowed.    LabResultsLast24Hours  Results for orders placed or performed during the hospital encounter of 12/18/17 (from the past 24 hour(s))  Hemoglobin A1c     Status: Abnormal   Collection Time: 12/19/17  7:40 AM  Result Value Ref Range   Hgb A1c MFr Bld 7.2 (H) 4.8 - 5.6 %   Mean Plasma Glucose 159.94 mg/dL  Lipid panel     Status: Abnormal   Collection Time: 12/19/17  7:40 AM  Result Value Ref Range   Cholesterol 137 0 - 200 mg/dL   Triglycerides 116 <150 mg/dL   HDL 33 (L) >40 mg/dL   Total CHOL/HDL Ratio 4.2 RATIO   VLDL 23 0 - 40 mg/dL   LDL Cholesterol 81 0 - 99 mg/dL  Glucose, capillary     Status: Abnormal   Collection Time: 12/19/17 11:04 AM  Result Value Ref Range    Glucose-Capillary 189 (H) 70 - 99 mg/dL  Basic metabolic panel     Status: Abnormal   Collection Time: 12/19/17 12:33 PM  Result Value Ref Range   Sodium 138 135 - 145 mmol/L   Potassium 3.4 (L) 3.5 - 5.1 mmol/L   Chloride 104 98 - 111 mmol/L   CO2 21 (L) 22 - 32 mmol/L   Glucose, Bld 179 (H) 70 - 99 mg/dL   BUN 23 8 - 23 mg/dL   Creatinine, Ser 1.50 (H) 0.61 - 1.24 mg/dL   Calcium 9.0 8.9 - 10.3 mg/dL   GFR calc non Af Amer 42 (L) >60 mL/min   GFR calc Af Amer 49 (L) >60 mL/min   Anion gap 13 5 - 15  Glucose, capillary     Status: Abnormal   Collection Time: 12/19/17  4:51 PM  Result Value Ref Range   Glucose-Capillary 112 (H) 70 - 99 mg/dL  Glucose, capillary     Status: Abnormal   Collection Time: 12/19/17  9:35 PM  Result Value Ref Range   Glucose-Capillary 203 (H) 70 - 99 mg/dL      ImagingResults(Last48hours)  Ct Head Wo Contrast  Result Date: 12/18/2017 CLINICAL DATA:  Right-sided weakness and headache since waking up this morning. EXAM: CT HEAD WITHOUT CONTRAST TECHNIQUE: Contiguous axial images were obtained from the base of the skull through the vertex without intravenous contrast. COMPARISON:  Brain MR dated 12/22/2016 and head CT dated 12/21/2016. FINDINGS: Brain: Stable mildly enlarged ventricles  and mildly to moderately enlarged subarachnoid spaces. Minimal patchy white matter low density in both cerebral hemispheres without significant change. Stable old left caudate lacunar infarct. No intracranial hemorrhage, mass lesion or CT evidence of acute infarction. Vascular: No hyperdense vessel or unexpected calcification. Skull: Normal. Negative for fracture or focal lesion. Sinuses/Orbits: No acute finding. Other: Again demonstrated is a right frontal scalp lipoma measuring 0.2 x 0.5 cm on image number 66 series 4. IMPRESSION: 1. No acute abnormality. 2. Stable mild atrophy, minimal chronic small vessel white matter ischemic changes in both cerebral  hemispheres and old left caudate lacunar infarct. 3. Stable right frontal scalp lipoma. Electronically Signed   By: Claudie Revering M.D.   On: 12/18/2017 11:49   Mr Brain Wo Contrast  Result Date: 12/19/2017 CLINICAL DATA:  Right-sided weakness. EXAM: MRI HEAD WITHOUT CONTRAST MRA HEAD WITHOUT CONTRAST TECHNIQUE: Multiplanar, multiecho pulse sequences of the brain and surrounding structures were obtained without intravenous contrast. Angiographic images of the head were obtained using MRA technique without contrast. COMPARISON:  Head CT 12/18/2017 and MRI 12/22/2016 FINDINGS: MRI HEAD FINDINGS Brain: There are multiple small, acute, predominantly cortically based infarcts in the left cerebral hemisphere involving the parietal lobe, posterior frontal lobe, and occipital lobe. A 4 mm acute infarct is also noted in the right centrum semiovale. There is a small amount of associated petechial hemorrhage in the left parietal lobe. Elsewhere, patchy T2 hyperintensities in the cerebral white matter bilaterally have progressed from the prior MRI and are nonspecific but compatible with moderate chronic small vessel ischemic disease. The ventricles and sulci are within normal limits for age. Small chronic infarcts in the left cerebellum and left basal ganglia are unchanged from the prior MRI. No mass, midline shift, or extra-axial fluid collection is identified. Vascular: Major intracranial vascular flow voids are preserved. Skull and upper cervical spine: Unremarkable bone marrow signal. Sinuses/Orbits: Unremarkable orbits. Trace right mastoid fluid. Clear paranasal sinuses. Other: Unchanged 25 x 5 mm right frontal scalp lipoma. MRA HEAD FINDINGS The study is mildly motion degraded. The visualized distal vertebral arteries are patent to the basilar with the left being dominant. PICA origins were not imaged. Normal SCA origins are visualized bilaterally. The basilar artery is patent without evidence of significant stenosis.  There are fetal origins of both PCAs. There is a moderate mid left P2 stenosis. The internal carotid arteries are widely patent from skull base to carotid termini. ACAs and MCAs are patent without evidence of proximal branch occlusion. The left A1 segment appears diffusely hypoplastic with a possible superimposed moderate focal stenosis distally. There is left M1 segment irregularity without significant stenosis. No aneurysm is identified. IMPRESSION: 1. Multiple small acute infarcts in the left MCA territory and left occipital lobe. 2. Acute lacunar infarct in the right centrum semiovale. 3. Moderate chronic small vessel ischemic disease, progressed from 2018. 4. Patent circle of Willis without major branch occlusion. 5. Moderate left P2 and possible left A1 stenoses. 6. Fetal origin of both PCAs. Electronically Signed   By: Logan Bores M.D.   On: 12/19/2017 16:02   Mr Jodene Nam Head Wo Contrast  Result Date: 12/19/2017 CLINICAL DATA:  Right-sided weakness. EXAM: MRI HEAD WITHOUT CONTRAST MRA HEAD WITHOUT CONTRAST TECHNIQUE: Multiplanar, multiecho pulse sequences of the brain and surrounding structures were obtained without intravenous contrast. Angiographic images of the head were obtained using MRA technique without contrast. COMPARISON:  Head CT 12/18/2017 and MRI 12/22/2016 FINDINGS: MRI HEAD FINDINGS Brain: There are multiple small, acute, predominantly cortically  based infarcts in the left cerebral hemisphere involving the parietal lobe, posterior frontal lobe, and occipital lobe. A 4 mm acute infarct is also noted in the right centrum semiovale. There is a small amount of associated petechial hemorrhage in the left parietal lobe. Elsewhere, patchy T2 hyperintensities in the cerebral white matter bilaterally have progressed from the prior MRI and are nonspecific but compatible with moderate chronic small vessel ischemic disease. The ventricles and sulci are within normal limits for age. Small chronic  infarcts in the left cerebellum and left basal ganglia are unchanged from the prior MRI. No mass, midline shift, or extra-axial fluid collection is identified. Vascular: Major intracranial vascular flow voids are preserved. Skull and upper cervical spine: Unremarkable bone marrow signal. Sinuses/Orbits: Unremarkable orbits. Trace right mastoid fluid. Clear paranasal sinuses. Other: Unchanged 25 x 5 mm right frontal scalp lipoma. MRA HEAD FINDINGS The study is mildly motion degraded. The visualized distal vertebral arteries are patent to the basilar with the left being dominant. PICA origins were not imaged. Normal SCA origins are visualized bilaterally. The basilar artery is patent without evidence of significant stenosis. There are fetal origins of both PCAs. There is a moderate mid left P2 stenosis. The internal carotid arteries are widely patent from skull base to carotid termini. ACAs and MCAs are patent without evidence of proximal branch occlusion. The left A1 segment appears diffusely hypoplastic with a possible superimposed moderate focal stenosis distally. There is left M1 segment irregularity without significant stenosis. No aneurysm is identified. IMPRESSION: 1. Multiple small acute infarcts in the left MCA territory and left occipital lobe. 2. Acute lacunar infarct in the right centrum semiovale. 3. Moderate chronic small vessel ischemic disease, progressed from 2018. 4. Patent circle of Willis without major branch occlusion. 5. Moderate left P2 and possible left A1 stenoses. 6. Fetal origin of both PCAs. Electronically Signed   By: Logan Bores M.D.   On: 12/19/2017 16:02     Assessment/Plan: Diagnosis: Multifocal infarcts Labs and images (see above) independently reviewed.  Records reviewed and summated above. Stroke: Continue secondary stroke prophylaxis and Risk Factor Modification listed below:   Antiplatelet therapy:   Blood Pressure Management:  Continue current medication with prn's  with permisive HTN per primary team Statin Agent:   Diabetes management:   Right sided hemiparesis Motor recovery: Fluoxetine   1. Does the need for close, 24 hr/day medical supervision in concert with the patient's rehab needs make it unreasonable for this patient to be served in a less intensive setting? Yes 2. Co-Morbidities requiring supervision/potential complications: recurrent UTI (cont abx), CVA, Parkinson's disease, CKD stage III (avoid nephrotoxic meds), DM (Monitor in accordance with exercise and adjust meds as necessary), seizure (cont meds), diastolic dysfunction (monitor for signs/symptoms of fluid overload), HTN (monitor and provide prns in accordance with increased physical exertion and pain), hypokalemia (continue to monitor and replete as necessary) 3. Due to safety, skin/wound care, disease management and patient education, does the patient require 24 hr/day rehab nursing? Yes 4. Does the patient require coordinated care of a physician, rehab nurse, PT (1-2 hrs/day, 5 days/week) and OT (1-2 hrs/day, 5 days/week) to address physical and functional deficits in the context of the above medical diagnosis(es)? Yes Addressing deficits in the following areas: balance, endurance, locomotion, strength, transferring, bathing, dressing, toileting and psychosocial support 5. Can the patient actively participate in an intensive therapy program of at least 3 hrs of therapy per day at least 5 days per week? Yes 6. The potential for  patient to make measurable gains while on inpatient rehab is excellent 7. Anticipated functional outcomes upon discharge from inpatient rehab are supervision and min assist  with PT, supervision and min assist with OT, n/a with SLP. 8. Estimated rehab length of stay to reach the above functional goals is: 14-17 days. 9. Anticipated D/C setting: SNF 10. Anticipated post D/C treatments: SNF 11. Overall Rehab/Functional Prognosis: good  RECOMMENDATIONS: This  patient's condition is appropriate for continued rehabilitative care in the following setting: Patient will unlikely return to baseline level of functioning after short CIR stay, which is what is required to return home.  Recommend SNF at this time. Patient has agreed to participate in recommended program. Potentially Note that insurance prior authorization may be required for reimbursement for recommended care.  Comment: Rehab Admissions Coordinator to follow up.   I have personally performed a face to face diagnostic evaluation, including, but not limited to relevant history and physical exam findings, of this patient and developed relevant assessment and plan.  Additionally, I have reviewed and concur with the physician assistant's documentation above.   Delice Lesch, MD, ABPMR Lavon Paganini Angiulli, PA-C 12/20/2017        Revision History                        Routing History

## 2017-12-30 NOTE — Progress Notes (Deleted)
PMR Admission Coordinator Pre-Admission Assessment  Patient: [name] [name] is an 83 y.o., male MRN: [id number] DOB: 1935-03-12 Height: 5\' 10"  (177.8 cm) Weight: 102.1 kg                                                                                                                                                  Insurance Information HMO: Yes    PPO:      PCP:      IPA:      80/20:      OTHER:  PRIMARY: [employer] Medicare      Policy#: [id number]      Subscriber: Patient CM Name: [name] [name]      Phone#: [phone] ext. [phone]     Fax#: [phone] for [place] [phone] provided by Dawson Bills with [facility] Medicare on 10/14 with admission date confirmed for 12/30/17.  Pre-Cert#: [id number]      Employer:  Benefits:  Phone #: NA     Name: Availity online portal Eff. Date: 03-16-17     Deduct: $0      Out of Pocket Max: $3,400      Life Max: NA CIR: $295/day for days 1-6, $0/day for days 7+      SNF: $0/day for days 1-20, $172/day for days 21-100 (100 day limit) Outpatient: per necessity     Co-Pay: $10-40, pending outpt hospital vs office Home Health: per necessity, 100%      Co-Pay:  DME: 80%     Co-Pay: 20% Providers:  SECONDARY:       Policy#:       Subscriber:  CM Name:       Phone#:      Fax#:  Pre-Cert#:       Employer:  Benefits:  Phone #:      Name:  Eff. Date:      Deduct:       Out of Pocket Max:       Life Max:  CIR:       SNF:  Outpatient:      Co-Pay:  Home Health:       Co-Pay:  DME:      Co-Pay:   Medicaid Application Date:       Case Manager:  Disability Application Date:       Case Worker:   Emergency Contact Information         Contact Information    Name Relation Home Work [name] [name] Significant other [phone]  [phone]     Current Medical History  Patient Admitting Diagnosis: Multifocal infarcts  History of Present Illness: [name] [name] is an 83 year old right-handed male with history of recurrent UTI, CVA with  right-sided residual weakness,QuestionParkinson's disease, CKD stage III with latest creatinine 1.72, diabetes mellitus and seizure disorder maintained on Keppra. Per chart review and patient,  patient lives with spouse. Ambulated with a cane and still driving short distances. One level home with 7 steps to entry. Wife does assist with some ADLs. Children in the area check as needed. Presented 12/18/2017 with right-sided weakness and headache. Urine drug screen negative. Cranial CT scan reviewed, unremarkable for acute intracranial process. Per report, old left caudate lacunar infarction. Patient did not receive TPA. MRI/MRA shows multiple small acute infarcts in the left MCA territory and left occipital lobe. Acute lacunar infarction in the right centrum semi-ovale. No major branch occlusion. Echocardiogram with ejection fraction of 54% grade 1 diastolic dysfunction. Carotid Dopplers with left ICA stenosis 80 to 99%. Placed on aspirin and Plavix for CVA prophylaxis. Subcutaneous heparin for DVT prophylaxis. A urine culture 12/18/2017 greater than 100,000 E. coli completing a course of Rocephin. Vascular surgery follow-up to address symptomatic left carotid stenosis and underwent left carotid endarterectomy 12/21/2017 per Dr. Deitra Mayo. Tolerating a regular consistency diet. Therapy evaluations completed with recommendations of physical medicine rehab consult. Patient was admitted for a comprehensive rehab program on 12/30/17.  Complete NIHSS TOTAL: 0  Past Medical History      Past Medical History:  Diagnosis Date  . Arthritis   . Diabetes mellitus without complication ([place])    type 2  . Prostate disease   . Renal calculi   . Renal disorder    kidney stones  . Seizures ([place])     Family History  family history is not on file.  Prior Rehab/Hospitalizations:  Has the patient had major surgery during 100 days prior to admission? No  Current Medications     Current Facility-Administered Medications:  .   stroke: mapping our early stages of recovery book, , Does not apply, Once, Gonfa, Taye T, MD .  0.9 %  sodium chloride infusion, 500 mL, Intravenous, Once PRN, Gonfa, Taye T, MD .  0.9 %  sodium chloride infusion, , Intravenous, Continuous, Gonfa, Taye T, MD .  acetaminophen (TYLENOL) tablet 650 mg, 650 mg, Oral, Q4H PRN, 650 mg at 12/20/17 2211 **OR** acetaminophen (TYLENOL) solution 650 mg, 650 mg, Per Tube, Q4H PRN **OR** acetaminophen (TYLENOL) suppository 650 mg, 650 mg, Rectal, Q4H PRN, Gonfa, Taye T, MD .  aspirin EC tablet 81 mg, 81 mg, Oral, Daily, Gonfa, Taye T, MD, 81 mg at 12/30/17 0943 .  clopidogrel (PLAVIX) tablet 75 mg, 75 mg, Oral, Daily, Cyndia Skeeters, Taye T, MD, 75 mg at 12/30/17 0943 .  docusate sodium (COLACE) capsule 100 mg, 100 mg, Oral, Daily, Cyndia Skeeters, Taye T, MD, 100 mg at 12/30/17 0944 .  finasteride (PROSCAR) tablet 5 mg, 5 mg, Oral, Daily, Cyndia Skeeters, Taye T, MD, 5 mg at 12/30/17 0943 .  guaiFENesin-dextromethorphan (ROBITUSSIN DM) 100-10 MG/5ML syrup 15 mL, 15 mL, Oral, Q4H PRN, Gonfa, Taye T, MD .  heparin injection 5,000 Units, 5,000 Units, Subcutaneous, Q8H, Wendee Beavers T, MD, 5,000 Units at 12/30/17 0626 .  Influenza vac split quadrivalent PF (FLUZONE HIGH-DOSE) injection 0.5 mL, 0.5 mL, Intramuscular, Tomorrow-1000, Gonfa, Taye T, MD .  insulin aspart (novoLOG) injection 0-5 Units, 0-5 Units, Subcutaneous, QHS, Mercy Riding, MD, 2 Units at 12/19/17 2139 .  insulin aspart (novoLOG) injection 0-9 Units, 0-9 Units, Subcutaneous, TID WC, Mercy Riding, MD, 2 Units at 12/30/17 1204 .  levETIRAcetam (KEPPRA) tablet 500 mg, 500 mg, Oral, BID, Cyndia Skeeters, Taye T, MD, 500 mg at 12/30/17 0944 .  loperamide (IMODIUM) capsule 2 mg, 2 mg, Oral, PRN, Purohit, Shrey C, MD, 2 mg at 12/26/17  2202 .  morphine 2 MG/ML injection 2 mg, 2 mg, Intravenous, Q2H PRN, Gonfa, Taye T, MD .  ondansetron (ZOFRAN) injection 4 mg, 4 mg, Intravenous, Q6H PRN,  Gonfa, Taye T, MD .  oxyCODONE-acetaminophen (PERCOCET/ROXICET) 5-325 MG per tablet 1-2 tablet, 1-2 tablet, Oral, Q4H PRN, Gonfa, Taye T, MD .  pantoprazole (PROTONIX) EC tablet 40 mg, 40 mg, Oral, Daily, Cyndia Skeeters, Taye T, MD, 40 mg at 12/30/17 0943 .  phenol (CHLORASEPTIC) mouth spray 1 spray, 1 spray, Mouth/Throat, PRN, Gonfa, Taye T, MD .  senna-docusate (Senokot-S) tablet 1 tablet, 1 tablet, Oral, QHS PRN, Gonfa, Taye T, MD .  simvastatin (ZOCOR) tablet 40 mg, 40 mg, Oral, Daily, Cyndia Skeeters, Taye T, MD, 40 mg at 12/30/17 0944 .  tamsulosin (FLOMAX) capsule 0.4 mg, 0.4 mg, Oral, QPC supper, Cyndia Skeeters, Taye T, MD, 0.4 mg at 12/29/17 1602  Patients Current Diet:     Diet Order                  Diet heart healthy/carb modified Room service appropriate? Yes; Fluid consistency: Thin  Diet effective now               Precautions / Restrictions Precautions Precautions: Fall Precaution Comments: L ankle monitor  Restrictions Weight Bearing Restrictions: No   Has the patient had 2 or more falls or a fall with injury in the past year?No  Prior Activity Level Limited Community (1-2x/wk): drove, got out of house 3-4x/week  [place] Devices/Equipment: Radio producer (specify quad or straight), Eyeglasses, Environmental consultant (specify type)(glasses for close up, Rolator walker) Home Equipment: Environmental consultant - 2 wheels, Walker - 4 wheels, [place] - single point, Bedside commode, Shower seat, Grab bars - tub/shower  Prior Device Use: Indicate devices/aids used by the patient prior to current illness, exacerbation or injury? Walker and single point cane; mostly used cane PTA  Prior Functional Level Prior Function Level of Independence: Needs assistance Gait / Transfers Assistance Needed: ambulated with cane ADL's / Homemaking Assistance Needed: linda (wife) helps with the showering, cooks but patient reports self dresses(reports linda completes all bathing, pt holding onto  grabbar)  Self Care: Did the patient need help bathing, dressing, using the toilet or eating?  Independent with up to min/mod A for bathing per pt (pt would stand and hold grab bars as his SO would bathe him).   Indoor Mobility: Did the patient need assistance with walking from room to room (with or without device)? Independent  Stairs: Did the patient need assistance with internal or external stairs (with or without device)? Independent  Functional Cognition: Did the patient need help planning regular tasks such as shopping or remembering to take medications? Independent  Current Functional Level Cognition  Arousal/Alertness: Awake/alert Overall Cognitive Status: Within Functional Limits for tasks assessed Current Attention Level: Selective Orientation Level: Oriented X4 Following Commands: Follows one step commands with increased time, Follows one step commands consistently Safety/Judgement: Decreased awareness of safety, Decreased awareness of deficits General Comments: wfl for this session.  Pt followed all one step commands, kept attention to task in minimally distracting environment. Asking about CIR and therapy requirements Attention: Sustained Sustained Attention: Appears intact Memory: Impaired Memory Impairment: Retrieval deficit, Decreased short term memory Decreased Short Term Memory: Verbal basic Awareness: Impaired Awareness Impairment: Intellectual impairment    Extremity Assessment (includes Sensation/Coordination)  Upper Extremity Assessment: RUE deficits/detail RUE Deficits / Details: able to use as gross assist RUE Sensation: decreased light touch, decreased proprioception RUE Coordination:  decreased fine motor, decreased gross motor LUE Deficits / Details: tremor noted but pt able to use functionally LUE Sensation: WNL LUE Coordination: (has tremor which doesn't interfere)  Lower Extremity Assessment: RLE deficits/detail RLE Deficits / Details: modest  assymetrical weakness, and liited functional strength during mobility RLE Coordination: decreased gross motor    ADLs  Overall ADL's : Needs assistance/impaired Eating/Feeding: Set up, Bed level Eating/Feeding Details (indicate cue type and reason): used L hand; decreased motor control R Grooming: Wash/dry hands, Wash/dry face, Brushing hair, Sitting, Set up Grooming Details (indicate cue type and reason): primary with use of LUE Upper Body Bathing: Minimal assistance, Bed level(brought LUE over; cannot reach under arm) Lower Body Bathing: Maximal assistance, Sit to/from stand Upper Body Dressing : Moderate assistance, Sitting Lower Body Dressing: Total assistance Lower Body Dressing Details (indicate cue type and reason): for socks with +2 Mod A sit>stand (for safety) from bed (increased time), forward stooped posture Toilet Transfer: Maximal assistance, +2 for physical assistance Toilet Transfer Details (indicate cue type and reason): ambulate 5 feet to sit in recliner behind him; heavy lean to right with tendency for stooped posture Toileting- Clothing Manipulation and Hygiene: Minimal assistance, Sit to/from stand General ADL Comments: used stedy with RN assisting to get to chair; performed grooming and RUE strengthening    Mobility  Overal bed mobility: Needs Assistance Bed Mobility: Supine to Sit Rolling: Min assist Sidelying to sit: Mod assist, HOB elevated Supine to sit: Mod assist, +2 for physical assistance, HOB elevated General bed mobility comments: increased time and effort    Transfers  Overall transfer level: Needs assistance Equipment used: Rolling walker (2 wheeled) Transfer via Lift Equipment: Stedy Transfers: Sit to/from Stand Sit to Stand: Mod assist, +2 physical assistance, From elevated surface Squat pivot transfers: Max assist, +2 physical assistance General transfer comment: stedy used for transfer to chair    Ambulation / Gait / Stairs / Wheelchair  Mobility  Ambulation/Gait Ambulation/Gait assistance: +2 physical assistance, Mod assist Gait Distance (Feet): 2 Feet Assistive device: Rolling walker (2 wheeled) Gait Pattern/deviations: Step-to pattern, Decreased step length - right, Decreased step length - left, Shuffle, Wide base of support General Gait Details: Assist for balance and support. Pt with bil knees in flexion. Rt lateral lean. Assist with movement and stability of walker. Gait velocity: decr Gait velocity interpretation: <1.31 ft/sec, indicative of household ambulator    Posture / Balance Dynamic Sitting Balance Sitting balance - Comments: pt able to sit EOB without UE support in static sitting Balance Overall balance assessment: Needs assistance Sitting-balance support: Feet supported, No upper extremity supported Sitting balance-Leahy Scale: Fair Sitting balance - Comments: pt able to sit EOB without UE support in static sitting Standing balance support: Bilateral upper extremity supported Standing balance-Leahy Scale: Poor Standing balance comment: walker and mod assist for static standing    Special needs/care consideration BiPAP/CPAP: no CPM: no Continuous Drip IV: 0.9% sodium chloride infusion  Dialysis: no        Days: no Life Vest: no Oxygen: no Special Bed: pt is on seizure precautions with padded bed due to history of seizures (per pt, last one approx 1 year ago).  Trach Size: no Wound Vac (area): no      Location: no Skin: Left neck surgical incision; closed perineum incision                      Bowel mgmt: continent, 12/29/17 Bladder mgmt: external cath in place Diabetic mgmt: yes  Previous Home Environment Living Arrangements: Spouse/significant other, Children  Lives With: Spouse Available Help at Discharge: Family Type of Home: House Home Layout: One level Home Access: Stairs to enter Entrance Stairs-Rails: Can reach both Entrance Stairs-Number of Steps: 7 Bathroom Shower/Tub:  Tub/shower unit, Architectural technologist: [place]: No  Discharge Living Setting Plans for Discharge Living Setting: Patient's home, Lives with (comment), Other (Comment)(Lives with [name] (SO) & Durene Fruits (83yo M) w/ cognitive deficits) Type of Home at Discharge: House Discharge Home Layout: One level Discharge Home Access: Stairs to enter; pt reports having ramp in back entrance.  Entrance Stairs-Rails: Right, Left(only able to reach 1 at a time) Entrance Stairs-Number of Steps: 7 Discharge Bathroom Shower/Tub: Tub/shower unit(with tub transfer bench) Discharge Bathroom Toilet: Standard Discharge Bathroom Accessibility: No(may be able to side step with RW?) Does the patient have any problems obtaining your medications?: Yes (Describe)(now no longer able to drive; may need delivery services)  Social/Family/Support Systems Patient Roles: Partner(retired; has SO that cooks, cleans, and PRN assists ) Contact Information: emergency contact is [name], pt's SOI  Anticipated Caregiver: [name]: home [phone]) cell [phone]) Anticipated Caregiver's Contact Information: see above Ability/Limitations of Caregiver: Supervision/Min A transfers; up to Min/Mod bathing Caregiver Availability: 24/7 Discharge Plan Discussed with Primary Caregiver: Yes Is Caregiver In Agreement with Plan?: Yes Does Caregiver/Family have Issues with Lodging/Transportation while Pt is in Rehab?: Yes([name] is blind; unable to get transport to Rehab)   Goals/Additional Needs Patient/Family Goal for Rehab: PT: Min A; OT: Min/Mod; SLP: NA Expected length of stay: 12-18 days Cultural Considerations: Pt states he is [place]; Psychologist, forensic Dietary Needs: heart healthy, carb modified, thin liquids Equipment Needs: TBD Special Service Needs: pt prefers [place] for [facility] therapy at DC Pt/Family Agrees to Admission and willing to participate: Yes Program Orientation Provided & Reviewed with Pt/Caregiver Including  Roles  & Responsibilities: Yes(pt and [name])  Barriers to Discharge: Inaccessible home environment, Home environment access/layout, Lack of/limited family support, Insurance for SNF coverage(minimal physical assist at home; pt unable to go to SNF)  Barriers to Discharge Comments: (legal issues prevent pt from going to SNF)   Decrease burden of Care through IP rehab admission: Decrease number of caregivers, Patient/family education. Pt unable to go to SNF due to legal issues. Pt and family aware that rehab is focused on improving his safety prior to returning home but that pt may still require physical assistance at DC; pt's significant other unable to provide much physical assist but aware CIR is his only option for intensive rehab at this time prior to returning home. Pt also explicitly refuses placement besides home after CIR, regardless of assistance recommended at [place] is for pt to return home after CIR stay with Home Health (pt has used [facility] prior). Would anticipate pt will need [place] aide. Pt has Primary Care MD that makes house calls and the VP Care Coordinator through [facility] continues to search for more resources through the pt's primary care office to see if he would qualify for SW/CM at DC.   Possible need for SNF placement upon discharge: Pt unable to be admitted to SNF due to legal issues. SNF is not an option per case management. Pt has adamantly refused any placement bedsides home after CIR, regardless of assistance recommended at DC.    Patient Condition: This patient's medical and functional status has changed since the consult dated 12/20/17 in which the Rehabilitation Physician documented that the patient was not appropriate for  intensive rehabilitative care in an inpatient rehabilitation facility. Since consult, it has been disclosed that pt is unable to be admitted to SNF, the recommended discharge disposition, due to legal issues and thus pt would have to  DC home straight from acute with high fall risk and need for two person assist for transfers. In order to reduce burden of care on pt's significant other and reduction in fall risk, pt is to be admitted to [facility] for short stay prior to returning home with anticipated home health therapy. Due to change in status and issues being addressed, patient's case has been discussed with Dr Naaman Plummer and patient now appropriate for inpatient rehabilitation. Will admit to inpatient rehab today.   Preadmission Screen Completed By:  Jhonnie Garner, 12/30/2017 1:22 PM ______________________________________________________________________   Discussed status with Dr. Naaman Plummer on 12/28/17 and 12/29/17 as well as on 12/30/17  at 1:24 PM and received telephone approval for admission today.  Admission Coordinator:  Jhonnie Garner, time 1:24PM/Date 12/30/17      Revision History

## 2017-12-30 NOTE — Progress Notes (Signed)
Inpatient Rehabilitation-Admissions Coordinator   [facility] has received medical approval for admit to CIR today. AC will update pt, RN, CM, and SW regarding plans.   Please call if questions.   Jhonnie Garner, OTR/L  Rehab Admissions Coordinator  [phone] 12/30/2017 1:32 PM

## 2017-12-30 NOTE — Discharge Summary (Signed)
Discharge Summary  [name] [name] DVV:[id number] DOB: April 30, 1934  PCP: Clovia Cuff, MD  Admit date: 12/18/2017 Discharge date: 12/30/2017  Time spent: 35 mins  Recommendations for Outpatient Follow-up:  1. PCP 2. Neurology 3. Vascular surgery  Discharge Diagnoses:  Active Hospital Problems   Diagnosis Date Noted  . Right sided weakness 12/18/2017  . Cerebral embolism with cerebral infarction 12/20/2017  . CVA (cerebral vascular accident) ([place]) 12/20/2017  . Acute ischemic stroke ([place])   . History of CVA (cerebrovascular accident)   . Parkinson disease ([place])   . Diastolic dysfunction   . Hypokalemia   . UTI (urinary tract infection) 12/21/2016  . Seizures ([place])   . Hyperlipidemia associated with type 2 diabetes mellitus ([place]) 09/06/2015  . Acute renal failure ([place]) 08/10/2015  . Diabetes mellitus, type 2 ([place]) 08/10/2015  . HTN (hypertension) 08/10/2015  . Hyperkalemia 08/10/2015    Resolved Hospital Problems   Diagnosis Date Noted Date Resolved  . Left-sided weakness 12/18/2017 12/18/2017    Discharge Condition: Stable  Diet recommendation: Heart healthy   Vitals:   12/30/17 0413 12/30/17 0847  BP: 130/79 (!) 143/70  Pulse: (!) 59 72  Resp: 20 18  Temp: 98 F (36.7 C) 98.2 F (36.8 C)  SpO2: 93% 93%    Hospital Course:  83 year old with past medical history relevant for BPH, type 2 diabetes on oral hypoglycemics, hyperlipidemia, seizure disorder, Parkinson's disease, prior CVA admitted with right-sided weakness and found to have multiple small acute infarcts in the left MCA and left occipital lobe, acute lacunar infarct in right centrum semiovale as well as 98% left carotid stenosis status post left carotid endarterectomy on 12/21/2017.  Principal Problem:   Right sided weakness Active Problems:   Hyperkalemia   Acute renal failure (HCC)   HTN (hypertension)   Diabetes mellitus, type 2 (HCC)   Hyperlipidemia associated with type 2 diabetes mellitus  (HCC)   Seizures ([place])   UTI (urinary tract infection)   Cerebral embolism with cerebral infarction   CVA (cerebral vascular accident) ([place])   Acute ischemic stroke ([place])   History of CVA (cerebrovascular accident)   Parkinson disease ([place])   Diastolic dysfunction   Hypokalemia   CVA status post left carotid endarterectomy on 12/21/17 Neurology following, appreciate recommendations Vascular surgery following, appreciate recommendations Hemoglobin A1c 7.2, lipid panel shows LDL of 81 Continue aspirin 81 mg Continue clopidogrel 75 mg Continue statin Per neurology recommendation is a 30-day event monitor outpatient  Type 2 diabetes Continue home metformin  CKD stage 3 Creatinine stable at baseline  Seizure disorder Continue levetiracetam  Hyperlipidemia Continue statin  BPH/UTI Patient completed a total of 5 days for E. coli UTI Continue tamsulosin 0.4 mg daily Continue finasteride 5 mg daily      Procedures: 12/21/2017 left carotid endarterectomy  Consultations:  Vascular surgery  Neurology  Discharge Exam: BP (!) 143/70 (BP Location: Right Arm)   Pulse 72   Temp 98.2 F (36.8 C) (Oral)   Resp 18   Ht 5\' 10"  (1.778 m)   Wt 102.1 kg   SpO2 93%   BMI 32.28 kg/m   General: NAD  Cardiovascular: S1, S2 present Respiratory: CTAB   Discharge Instructions You were cared for by a hospitalist during your hospital stay. If you have any questions about your discharge medications or the care you received while you were in the hospital after you are discharged, you can call the unit and asked to speak with the hospitalist on call if the hospitalist  that took care of you is not available. Once you are discharged, your primary care physician will handle any further medical issues. Please note that NO REFILLS for any discharge medications will be authorized once you are discharged, as it is imperative that you return to your primary care physician (or establish a  relationship with a primary care physician if you do not have one) for your aftercare needs so that they can reassess your need for medications and monitor your lab values.  Discharge Instructions    Ambulatory referral to Neurology   Complete by:  As directed    Follow up with stroke clinic NP (Jessica Vanschaick or Cecille Rubin, if both not available, consider Zachery Dauer, or [place]) at [facility] in about 4 weeks. Thanks.     Allergies as of 12/30/2017   No Known Allergies     Medication List    STOP taking these medications   sitaGLIPtin 50 MG tablet Commonly known as:  JANUVIA     TAKE these medications   aspirin 81 MG tablet Take 81 mg by mouth daily.   clopidogrel 75 MG tablet Commonly known as:  PLAVIX Take 1 tablet (75 mg total) by mouth daily. Start taking on:  12/31/2017   finasteride 5 MG tablet Commonly known as:  PROSCAR Take 5 mg by mouth daily.   heparin 5000 UNIT/ML injection Inject 1 mL (5,000 Units total) into the skin every 8 (eight) hours.   ibuprofen 200 MG tablet Commonly known as:  ADVIL,MOTRIN Take 200 mg by mouth every 6 (six) hours as needed for headache or mild pain.   levETIRAcetam 500 MG tablet Commonly known as:  KEPPRA Take 1 tablet (500 mg total) by mouth 2 (two) times daily.   metFORMIN 1000 MG tablet Commonly known as:  GLUCOPHAGE Take 1 tablet (1,000 mg total) by mouth 2 (two) times daily with a meal.   pantoprazole 40 MG tablet Commonly known as:  PROTONIX Take 1 tablet (40 mg total) by mouth daily. Start taking on:  12/31/2017   simvastatin 20 MG tablet Commonly known as:  ZOCOR Take 20 mg by mouth daily.   tamsulosin 0.4 MG Caps capsule Commonly known as:  FLOMAX Take 1 capsule (0.4 mg total) by mouth daily after supper. What changed:  when to take this      No Known Allergies Follow-up Information    [facility]. Schedule an appointment as soon as possible for a visit in 4 week(s).   Specialty:   Neurology Contact information: [address] [place] [phone]       Angelia Mould, MD In 3 weeks.   Specialties:  Vascular Surgery, Cardiology Why:  Office will call you to arrange your appt (sent) Contact information: [place] [address] [phone]        Clovia Cuff, MD. Schedule an appointment as soon as possible for a visit in 1 week(s).   Specialty:  Internal Medicine Contact information: [place] [address] [phone]            The results of significant diagnostics from this hospitalization (including imaging, microbiology, ancillary and laboratory) are listed below for reference.    Significant Diagnostic Studies: Ct Head Wo Contrast  Result Date: 12/18/2017 CLINICAL DATA:  Right-sided weakness and headache since waking up this morning. EXAM: CT HEAD WITHOUT CONTRAST TECHNIQUE: Contiguous axial images were obtained from the base of the skull through the vertex without intravenous  contrast. COMPARISON:  Brain MR dated 12/22/2016 and head CT dated 12/21/2016. FINDINGS: Brain: Stable mildly enlarged ventricles and mildly to moderately enlarged subarachnoid spaces. Minimal patchy white matter low density in both cerebral hemispheres without significant change. Stable old left caudate lacunar infarct. No intracranial hemorrhage, mass lesion or CT evidence of acute infarction. Vascular: No hyperdense vessel or unexpected calcification. Skull: Normal. Negative for fracture or focal lesion. Sinuses/Orbits: No acute finding. Other: Again demonstrated is a right frontal scalp lipoma measuring 0.2 x 0.5 cm on image number 66 series 4. IMPRESSION: 1. No acute abnormality. 2. Stable mild atrophy, minimal chronic small vessel white matter ischemic changes in both cerebral hemispheres and old left caudate lacunar infarct. 3. Stable right frontal scalp lipoma. Electronically Signed   By: Claudie Revering M.D.   On: 12/18/2017 11:49   Mr Brain Wo Contrast  Result Date: 12/19/2017 CLINICAL DATA:  Right-sided weakness. EXAM: MRI HEAD WITHOUT CONTRAST MRA HEAD WITHOUT CONTRAST TECHNIQUE: Multiplanar, multiecho pulse sequences of the brain and surrounding structures were obtained without intravenous contrast. Angiographic images of the head were obtained using MRA technique without contrast. COMPARISON:  Head CT 12/18/2017 and MRI 12/22/2016 FINDINGS: MRI HEAD FINDINGS Brain: There are multiple small, acute, predominantly cortically based infarcts in the left cerebral hemisphere involving the parietal lobe, posterior frontal lobe, and occipital lobe. A 4 mm acute infarct is also noted in the right centrum semiovale. There is a small amount of associated petechial hemorrhage in the left parietal lobe. Elsewhere, patchy T2 hyperintensities in the cerebral white matter bilaterally have progressed from the prior MRI and are nonspecific but compatible with moderate chronic small vessel ischemic disease. The ventricles and sulci are within normal limits for age. Small chronic infarcts in the left cerebellum and left basal ganglia are unchanged from the prior MRI. No mass, midline shift, or extra-axial fluid collection is identified. Vascular: Major intracranial vascular flow voids are preserved. Skull and upper cervical spine: Unremarkable bone marrow signal. Sinuses/Orbits: Unremarkable orbits. Trace right mastoid fluid. Clear paranasal sinuses. Other: Unchanged 25 x 5 mm right frontal scalp lipoma. MRA HEAD FINDINGS The study is mildly motion degraded. The visualized distal vertebral arteries are patent to the basilar with the left being dominant. PICA origins were not imaged. Normal SCA origins are visualized bilaterally. The basilar artery is patent without evidence of significant stenosis. There are fetal origins of both PCAs. There is a moderate mid left P2 stenosis. The internal carotid arteries are widely  patent from skull base to carotid termini. ACAs and MCAs are patent without evidence of proximal branch occlusion. The left A1 segment appears diffusely hypoplastic with a possible superimposed moderate focal stenosis distally. There is left M1 segment irregularity without significant stenosis. No aneurysm is identified. IMPRESSION: 1. Multiple small acute infarcts in the left MCA territory and left occipital lobe. 2. Acute lacunar infarct in the right centrum semiovale. 3. Moderate chronic small vessel ischemic disease, progressed from 2018. 4. Patent circle of Willis without major branch occlusion. 5. Moderate left P2 and possible left A1 stenoses. 6. Fetal origin of both PCAs. Electronically Signed   By: Logan Bores M.D.   On: 12/19/2017 16:02   Mr Jodene Nam Head Wo Contrast  Result Date: 12/19/2017 CLINICAL DATA:  Right-sided weakness. EXAM: MRI HEAD WITHOUT CONTRAST MRA HEAD WITHOUT CONTRAST TECHNIQUE: Multiplanar, multiecho pulse sequences of the brain and surrounding structures were obtained without intravenous contrast. Angiographic images of the head were obtained using MRA technique without contrast. COMPARISON:  Head CT 12/18/2017 and MRI 12/22/2016 FINDINGS: MRI HEAD FINDINGS Brain: There are multiple small, acute, predominantly cortically based infarcts in the left cerebral hemisphere involving the parietal lobe, posterior frontal lobe, and occipital lobe. A 4 mm acute infarct is also noted in the right centrum semiovale. There is a small amount of associated petechial hemorrhage in the left parietal lobe. Elsewhere, patchy T2 hyperintensities in the cerebral white matter bilaterally have progressed from the prior MRI and are nonspecific but compatible with moderate chronic small vessel ischemic disease. The ventricles and sulci are within normal limits for age. Small chronic infarcts in the left cerebellum and left basal ganglia are unchanged from the prior MRI. No mass, midline shift, or extra-axial  fluid collection is identified. Vascular: Major intracranial vascular flow voids are preserved. Skull and upper cervical spine: Unremarkable bone marrow signal. Sinuses/Orbits: Unremarkable orbits. Trace right mastoid fluid. Clear paranasal sinuses. Other: Unchanged 25 x 5 mm right frontal scalp lipoma. MRA HEAD FINDINGS The study is mildly motion degraded. The visualized distal vertebral arteries are patent to the basilar with the left being dominant. PICA origins were not imaged. Normal SCA origins are visualized bilaterally. The basilar artery is patent without evidence of significant stenosis. There are fetal origins of both PCAs. There is a moderate mid left P2 stenosis. The internal carotid arteries are widely patent from skull base to carotid termini. ACAs and MCAs are patent without evidence of proximal branch occlusion. The left A1 segment appears diffusely hypoplastic with a possible superimposed moderate focal stenosis distally. There is left M1 segment irregularity without significant stenosis. No aneurysm is identified. IMPRESSION: 1. Multiple small acute infarcts in the left MCA territory and left occipital lobe. 2. Acute lacunar infarct in the right centrum semiovale. 3. Moderate chronic small vessel ischemic disease, progressed from 2018. 4. Patent circle of Willis without major branch occlusion. 5. Moderate left P2 and possible left A1 stenoses. 6. Fetal origin of both PCAs. Electronically Signed   By: Logan Bores M.D.   On: 12/19/2017 16:02    Microbiology: No results found for this or any previous visit (from the past 240 hour(s)).   Labs: Basic Metabolic Panel: Recent Labs  Lab 12/24/17 0338 12/25/17 0325 12/26/17 0241 12/28/17 0342  NA 142 141 142 139  K 3.3* 3.3* 3.6 3.6  CL 108 108 109 106  CO2 23 22 24 22   GLUCOSE 121* 119* 134* 134*  BUN 21 22 22  25*  CREATININE 1.52* 1.42* 1.49* 1.45*  CALCIUM 8.6* 8.8* 8.8* 8.8*   Liver Function Tests: No results for input(s): AST,  ALT, ALKPHOS, BILITOT, PROT, ALBUMIN in the last 168 hours. No results for input(s): LIPASE, AMYLASE in the last 168 hours. No results for input(s): AMMONIA in the last 168 hours. CBC: Recent Labs  Lab 12/28/17 0342  WBC 9.5  HGB 15.2  HCT 45.8  MCV 96.2  PLT 183   Cardiac Enzymes: No results for input(s): CKTOTAL, CKMB, CKMBINDEX, TROPONINI in the last 168 hours. BNP: BNP (last 3 results) No results for input(s): BNP in the last 8760 hours.  ProBNP (last 3 results) No results for input(s): PROBNP in the last 8760 hours.  CBG: Recent Labs  Lab 12/29/17 1117 12/29/17 1600 12/29/17 2120 12/30/17 0619 12/30/17 1128  GLUCAP 165* 175* 167* 133* 153*       Signed:  Alma Friendly, MD [facility] 12/30/2017, 1:55 PM

## 2017-12-30 NOTE — Progress Notes (Addendum)
Pt A/O, no noted distress. MASD Abd folds/groin areas, heels noted redness, and scattered bruising. Healed scar left side of chin. BUE/BLE noted weakness. 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 He has great support from his church. His doctor makes home visits to home (Dr. Herbert Pun).

## 2017-12-31 ENCOUNTER — Inpatient Hospital Stay (HOSPITAL_COMMUNITY): Payer: Medicare HMO | Admitting: Physical Therapy

## 2017-12-31 ENCOUNTER — Inpatient Hospital Stay (HOSPITAL_COMMUNITY): Payer: Medicare HMO | Admitting: Occupational Therapy

## 2017-12-31 DIAGNOSIS — R569 Unspecified convulsions: Secondary | ICD-10-CM

## 2017-12-31 DIAGNOSIS — E1169 Type 2 diabetes mellitus with other specified complication: Secondary | ICD-10-CM

## 2017-12-31 DIAGNOSIS — E46 Unspecified protein-calorie malnutrition: Secondary | ICD-10-CM

## 2017-12-31 DIAGNOSIS — N183 Chronic kidney disease, stage 3 unspecified: Secondary | ICD-10-CM

## 2017-12-31 DIAGNOSIS — N400 Enlarged prostate without lower urinary tract symptoms: Secondary | ICD-10-CM

## 2017-12-31 DIAGNOSIS — E8809 Other disorders of plasma-protein metabolism, not elsewhere classified: Secondary | ICD-10-CM

## 2017-12-31 DIAGNOSIS — N179 Acute kidney failure, unspecified: Secondary | ICD-10-CM

## 2017-12-31 DIAGNOSIS — N189 Chronic kidney disease, unspecified: Secondary | ICD-10-CM | POA: Insufficient documentation

## 2017-12-31 DIAGNOSIS — E669 Obesity, unspecified: Secondary | ICD-10-CM

## 2017-12-31 DIAGNOSIS — I63512 Cerebral infarction due to unspecified occlusion or stenosis of left middle cerebral artery: Secondary | ICD-10-CM

## 2017-12-31 DIAGNOSIS — E785 Hyperlipidemia, unspecified: Secondary | ICD-10-CM

## 2017-12-31 LAB — COMPREHENSIVE METABOLIC PANEL
ALBUMIN: 3 g/dL — AB (ref 3.5–5.0)
ALT: 18 U/L (ref 0–44)
ANION GAP: 10 (ref 5–15)
AST: 18 U/L (ref 15–41)
Alkaline Phosphatase: 82 U/L (ref 38–126)
BILIRUBIN TOTAL: 0.6 mg/dL (ref 0.3–1.2)
BUN: 25 mg/dL — AB (ref 8–23)
CHLORIDE: 105 mmol/L (ref 98–111)
CO2: 23 mmol/L (ref 22–32)
Calcium: 9 mg/dL (ref 8.9–10.3)
Creatinine, Ser: 1.85 mg/dL — ABNORMAL HIGH (ref 0.61–1.24)
GFR calc Af Amer: 38 mL/min — ABNORMAL LOW (ref >60)
GFR calc non Af Amer: 33 mL/min — ABNORMAL LOW (ref >60)
GLUCOSE: 144 mg/dL — AB (ref 70–99)
POTASSIUM: 3.6 mmol/L (ref 3.5–5.1)
SODIUM: 138 mmol/L (ref 135–145)
Total Protein: 6.2 g/dL — ABNORMAL LOW (ref 6.5–8.1)

## 2017-12-31 LAB — CBC WITH DIFFERENTIAL/PLATELET
Abs Immature Granulocytes: 0.02 10*3/uL (ref 0.00–0.07)
BASOS ABS: 0 10*3/uL (ref 0.0–0.1)
Basophils Relative: 1 %
EOS ABS: 0.3 10*3/uL (ref 0.0–0.5)
Eosinophils Relative: 5 %
HEMATOCRIT: 45.8 % (ref 39.0–52.0)
Hemoglobin: 14.7 g/dL (ref 13.0–17.0)
IMMATURE GRANULOCYTES: 0 %
LYMPHS PCT: 19 %
Lymphs Abs: 1.2 10*3/uL (ref 0.7–4.0)
MCH: 30.9 pg (ref 26.0–34.0)
MCHC: 32.1 g/dL (ref 30.0–36.0)
MCV: 96.4 fL (ref 80.0–100.0)
Monocytes Absolute: 0.6 10*3/uL (ref 0.1–1.0)
Monocytes Relative: 10 %
NEUTROS PCT: 65 %
Neutro Abs: 4.1 10*3/uL (ref 1.7–7.7)
Platelets: 210 10*3/uL (ref 150–400)
RBC: 4.75 MIL/uL (ref 4.22–5.81)
RDW: 12.7 % (ref 11.5–15.5)
WBC: 6.2 10*3/uL (ref 4.0–10.5)
nRBC: 0 % (ref 0.0–0.2)

## 2017-12-31 LAB — GLUCOSE, CAPILLARY
GLUCOSE-CAPILLARY: 137 mg/dL — AB (ref 70–99)
Glucose-Capillary: 140 mg/dL — ABNORMAL HIGH (ref 70–99)
Glucose-Capillary: 213 mg/dL — ABNORMAL HIGH (ref 70–99)
Glucose-Capillary: 172 mg/dL — ABNORMAL HIGH (ref 70–99)

## 2017-12-31 MED ORDER — PRO-STAT SUGAR FREE PO LIQD
30.0000 mL | Freq: Two times a day (BID) | ORAL | Status: DC
  Administered 2017-12-31 – 2018-01-18 (×37): 30 mL via ORAL
  Filled 2017-12-31 (×37): qty 30

## 2017-12-31 MED ORDER — MAGNESIUM HYDROXIDE 400 MG/5ML PO SUSP
30.0000 mL | Freq: Once | ORAL | Status: DC
  Filled 2017-12-31 (×4): qty 30

## 2017-12-31 MED ORDER — FLEET ENEMA 7-19 GM/118ML RE ENEM
1.0000 | ENEMA | Freq: Once | RECTAL | Status: DC
  Filled 2017-12-31: qty 1

## 2017-12-31 MED ORDER — SENNOSIDES-DOCUSATE SODIUM 8.6-50 MG PO TABS
2.0000 | ORAL_TABLET | Freq: Every day | ORAL | Status: DC
  Administered 2017-12-31 – 2018-01-17 (×8): 2 via ORAL
  Filled 2017-12-31 (×14): qty 2

## 2017-12-31 MED ORDER — LIDOCAINE HCL URETHRAL/MUCOSAL 2 % EX GEL
CUTANEOUS | Status: DC | PRN
  Filled 2017-12-31: qty 5

## 2017-12-31 NOTE — Evaluation (Signed)
Physical Therapy Assessment and Plan  Patient Details  Name: [name] [name] MRN: [id number] Date of Birth: Aug 29, 1934  PT Diagnosis: Abnormal posture, Abnormality of gait, Ataxia, Hemiplegia dominant, Impaired sensation and Muscle weakness Rehab Potential: Good ELOS: 18-21days    Today's Date: 12/31/2017 PT Individual Time: 0800-0900 PT Individual Time Calculation (min): 60 min    Problem List:  Patient Active Problem List   Diagnosis Date Noted  . Hyperlipidemia LDL goal <70   . Hypoalbuminemia due to protein-calorie malnutrition ([place])   . Benign prostatic hyperplasia   . Diabetes mellitus type 2 in obese ([place])   . AKI (acute kidney injury) ([place])   . Stage 3 chronic kidney disease ([place])   . Morbid obesity ([place])   . Acute ischemic left middle cerebral artery (MCA) stroke ([place]) 12/30/2017  . Cerebral infarction ([place])   . Cerebral embolism with cerebral infarction 12/20/2017  . CVA (cerebral vascular accident) ([place]) 12/20/2017  . Acute ischemic stroke ([place])   . History of CVA (cerebrovascular accident)   . Parkinson disease ([place])   . Diastolic dysfunction   . Hypokalemia   . Right sided weakness 12/18/2017  . Chronic venous insufficiency 05/05/2017  . .0... 12/21/2016  . Weakness 12/21/2016  . Contracture of muscle of right upper arm 12/21/2016  . UTI (urinary tract infection) 12/21/2016  . Seizures ([place])   . Left knee pain 09/06/2015  . Hyperlipidemia associated with type 2 diabetes mellitus ([place]) 09/06/2015  . Tremor 09/06/2015  . Urinary frequency 09/06/2015  . Acute urinary retention 08/12/2015  . Hyperkalemia 08/10/2015  . Acute renal failure ([place]) 08/10/2015  . Acute bilateral obstructive uropathy 08/10/2015  . HTN (hypertension) 08/10/2015  . Diabetes mellitus, type 2 ([place]) 08/10/2015    Past Medical History:  Past Medical History:  Diagnosis Date  . Arthritis   . Diabetes mellitus without complication ([place])    type 2  . Prostate disease   . Renal calculi    . Renal disorder    kidney stones  . Seizures ([place])    Past Surgical History:  Past Surgical History:  Procedure Laterality Date  . CYSTOSCOPY W/ URETERAL STENT PLACEMENT Bilateral 08/10/2015   Procedure: CYSTOSCOPY WITH BILATERAL RETROGRADE PYELOGRAM/BILATERAL URETERAL STENT PLACEMENT;  Surgeon: Kathie Rhodes, MD;  Location: [facility];  Service: Urology;  Laterality: Bilateral;  . CYSTOSCOPY WITH RETROGRADE PYELOGRAM, URETEROSCOPY AND STENT PLACEMENT Bilateral 09/02/2015   Procedure: CYSTOSCOPY BILATERAL STENT REMOVAL BILATERAL RETROGRADE PYELOGRAM, BILATERAL URETEROSCOPY AND BILATERAL STENT PLACEMENT;  Surgeon: Kathie Rhodes, MD;  Location: [facility];  Service: Urology;  Laterality: Bilateral;  . ENDARTERECTOMY Left 12/21/2017   Procedure: Left ENDARTERECTOMY CAROTID;  Surgeon: Angelia Mould, MD;  Location: [facility];  Service: Vascular;  Laterality: Left;  . HOLMIUM LASER APPLICATION Bilateral 0/17/7939   Procedure: BILATERAL HOLMIUM LASER  LITHO ;  Surgeon: Kathie Rhodes, MD;  Location: [facility];  Service: Urology;  Laterality: Bilateral;  . JOINT REPLACEMENT Right 2007   hip, Dr. Alvan Dame  . PATCH ANGIOPLASTY Left 12/21/2017   Procedure: PATCH ANGIOPLASTY;  Surgeon: Angelia Mould, MD;  Location: [facility] OR;  Service: Vascular;  Laterality: Left;  . TONSILLECTOMY Bilateral    70 years ago    Assessment & Plan Clinical Impression: Patient is a 41 year oldRH-malewith history of seizure disorder, T2DM,LUEtremors, recurrent UTIs, questionable TIA event last year; who was admitted on 12/18/2017 with right-sided weakness, dizziness and numbness. MRI/MRA brain showed multiple small acute infarct in left MCA territory and left occipital lobe, acute  lacunar infarct right centrum semiovale and moderate left P2 and possible left A1 stenosis. MRI cervical spine showed moderate DDD most severe C5/C6with neural foraminal stenosis.2D echo showed EF of 50 to 55% with moderate concentric  hypertrophy,akinesis of input septal myocardium and no SOE.Carotid Dopplers done revealing left 80 to 99% ICA stenosis.   Dr. Doren Custard consulted for input and recommended left carotid endarterectomy to reduce risk of further strokes.Dr Annette Stable consulted for input and did not feel that surgical intervention needed at this time. He underwent left CEA on 10/08/2019and to continue DAPT for 3 weeks then Plavix alone. Patient with deficits in mobility and self-care tasks.   Patient transferred to CIR on 12/30/2017 .   Patient currently requires mod with mobility secondary to muscle weakness and muscle joint tightness, abnormal tone, unbalanced muscle activation, ataxia, decreased coordination and decreased motor planning and decreased sitting balance, decreased standing balance, decreased postural control, hemiplegia and decreased balance strategies.  Prior to hospitalization, patient was modified independent  with mobility and lived with Spouse, Family in a House home.  Home access is 7Stairs to enter.  Patient will benefit from skilled PT intervention to maximize safe functional mobility, minimize fall risk and decrease caregiver burden for planned discharge home with 24 hour supervision.  Anticipate patient will benefit from follow up [place] at discharge.  PT - End of Session Activity Tolerance: Tolerates < 10 min activity, no significant change in vital signs Endurance Deficit: Yes PT Assessment Rehab Potential (ACUTE/IP ONLY): Good PT Barriers to Discharge: [place] home environment;Decreased caregiver support;Home environment access/layout;Incontinence;Lack of/limited family support PT Patient demonstrates impairments in the following area(s): Balance;Edema;Endurance;Motor;Pain;Safety;Skin Integrity PT Transfers Functional Problem(s): Bed Mobility;Bed to Chair;Car;Furniture PT Locomotion Functional Problem(s): Ambulation;Wheelchair Mobility;Stairs PT Plan PT Intensity: Minimum of 1-2 x/day ,45 to  90 minutes PT Frequency: 5 out of 7 days PT Duration Estimated Length of Stay: 18-21days  PT Treatment/Interventions: Ambulation/gait training;Balance/vestibular training;Community reintegration;Discharge planning;Disease management/prevention;Functional electrical stimulation;DME/adaptive equipment instruction;Functional mobility training;Neuromuscular re-education;Patient/family education;Pain management;Skin care/wound management;Splinting/orthotics;Psychosocial support;UE/LE Strength taining/ROM;Therapeutic Activities;Stair training;Therapeutic Exercise;UE/LE Coordination activities;Visual/perceptual remediation/compensation;Wheelchair propulsion/positioning PT Transfers Anticipated Outcome(s): Supervision assist with LRAD  PT Locomotion Anticipated Outcome(s): Supervision assist at household level  PT Recommendation Follow Up Recommendations: Home health PT Patient destination: Home Equipment Recommended: Rolling walker with 5" wheels;Wheelchair cushion (measurements);Wheelchair (measurements)  Skilled Therapeutic Intervention Pt received supine in bed and agreeable to PT. Supine>sit transfer with mod assist and min cues for sequencing. PT instructed patient in PT Evaluation and initiated treatment intervention; see below for results. PT educated patient in [place], rehab potential, rehab goals, and discharge recommendations. Patient returned to room and left sitting in [facility] with call bell in reach and all needs met.       PT Evaluation Precautions/Restrictions   fall.  General   Vital SignsTherapy Vitals Temp: 97.6 F (36.4 C) Temp Source: Oral Pulse Rate: 96 Resp: 18 BP: 132/66 Patient Position (if appropriate): Lying Oxygen Therapy SpO2: 90 % O2 Device: Room Air Pain   Home Living/Prior Functioning Home Living Available Help at Discharge: Family;Friend(s);Available 24 hours/day Type of Home: House Home Access: Stairs to enter [employer] of Steps: 7 Entrance  Stairs-Rails: Left;Right Home Layout: One level Bathroom Shower/Tub: Chiropodist: Standard Additional Comments: shower has safety bars   Lives With: Spouse;Family Prior Function Level of Independence: Independent with homemaking with ambulation;Requires assistive device for independence  Able to Take Stairs?: Yes Driving: Yes Vocation: Retired Comments: goes to church regularly  Vision/Perception    [employer] Cognition Overall Cognitive  Status: Within Functional Limits for tasks assessed Arousal/Alertness: Awake/alert Orientation Level: Oriented X4 Sensation Sensation Light Touch: Impaired by gross assessment(RUE) Proprioception: Impaired by gross assessment(RUE and RLE) Coordination Gross Motor Movements are Fluid and Coordinated: No Fine Motor Movements are Fluid and Coordinated: No Coordination and Movement Description: ataxia, decreased speed and amplitude of movement on the R UE Finger Nose Finger Test: ataxic decreased coorinaiton and speed on the R Motor  Motor Motor: Hemiplegia;Abnormal postural alignment and control Motor - Skilled Clinical Observations: R hemiplegia UE >LE. baseline tremor from PD  Mobility Bed Mobility Bed Mobility: Rolling Right;Rolling Left;Supine to Sit;Sit to Supine Rolling Right: Supervision/verbal cueing Rolling Left: Supervision/Verbal cueing Supine to Sit: Moderate Assistance - Patient 50-74% Sit to Sidelying Left: Moderate Assistance - Patient 50-74% Transfers Transfers: Sit to Stand;Stand Pivot Transfers Sit to Stand: Moderate Assistance - Patient 50-74%;Minimal Assistance - Patient > 75% Stand Pivot Transfers: Moderate Assistance - Patient 50 - 74%;Minimal Assistance - Patient > 75% Transfer (Assistive device): Rolling walker Locomotion  Gait Ambulation: Yes Gait Assistance: Moderate Assistance - Patient 50-74% Gait Distance (Feet): 17 Feet Assistive device: Rolling walker Gait Gait: Yes Gait Pattern:  Impaired Gait Pattern: Right steppage;Right flexed knee in stance;Left flexed knee in stance;Lateral trunk lean to right;Decreased step length - right Stairs / Additional Locomotion Stairs: No Wheelchair Mobility Wheelchair Mobility: Yes Wheelchair Assistance: Moderate Assistance - Patient 50 - 74% Wheelchair Propulsion: Both upper extremities Wheelchair Parts Management: Needs assistance Distance: 75  Trunk/Postural Assessment  Cervical Assessment Cervical Assessment: Within Functional Limits Thoracic Assessment Thoracic Assessment: Exceptions to [facility] Lumbar Assessment Lumbar Assessment: Exceptions to [facility] Postural Control Postural Control: Deficits on evaluation(R lateral lean )  Balance Dynamic Sitting Balance Dynamic Sitting - Level of Assistance: 4: Min assist Static Standing Balance Static Standing - Level of Assistance: 4: Min assist Dynamic Standing Balance Dynamic Standing - Level of Assistance: 2: Max assist Extremity Assessment      RLE Assessment RLE Assessment: Within Functional Limits Active Range of Motion (AROM) Comments: lacking ~10 degrees full extension due to premorbid OA and tight HS General Strength Comments: 4+/5 proximal to distal  LLE Assessment LLE Assessment: Within Functional Limits Active Range of Motion (AROM) Comments: lack ~5 degrees full extension due to premorbid OA and HS tightness.  General Strength Comments: 5/5 Proximal to distal     Refer to Care Plan for Long Term Goals  Recommendations for other services: Neuropsych and Therapeutic Recreation  Kitchen group, Stress management and Outing/community reintegration  Discharge Criteria: Patient will be discharged from PT if patient refuses treatment 3 consecutive times without medical reason, if treatment goals not met, if there is a change in medical status, if patient makes no progress towards goals or if patient is discharged from hospital.  The above assessment, treatment plan,  treatment alternatives and goals were discussed and mutually agreed upon: by patient  [name] 12/31/2017, 9:09 AM

## 2017-12-31 NOTE — Progress Notes (Signed)
Occupational Therapy Session Note  Patient Details  Name: [name] [name] MRN: [id number] Date of Birth: 1934-09-15  Today's Date: 12/31/2017 OT Individual Time: 2563-8937 OT Individual Time Calculation (min): 72 min    Short Term Goals: Week 1:  OT Short Term Goal 1 (Week 1): Pt will be able to sit to stand from commode with min A. OT Short Term Goal 2 (Week 1): Pt will be able to self cleanse with min A post toileting. OT Short Term Goal 3 (Week 1): Pt will be able to don pants over feet with max A.   OT Short Term Goal 4 (Week 1): Pt will be able to don shirt with mod A.   OT Short Term Goal 5 (Week 1): Pt will be able to use RUE to pull pants over hips with mod A.  Skilled Therapeutic Interventions/Progress Updates:    Pt completed squat pivot/stand pivot transfer to the therapy mat from the wheelchair with max assist to start session.  Had pt work on sit to stand with use of the [facility] for several intervals from lowered mat and slightly elevated mat with mod assist.  He was able to maintain standing for intervals of 2 mins as well.  During use of the [place] pt reported the need to use the bathroom so therapist transitioned pt to the bathroom with use of the [place].  Max assist for clothing management and toilet hygiene to complete task.  Returned pt to the gym via [place] to continue session.  Had pt work on RUE functional reach and FM coordination by picking up small pieces of foam and placing them in a cup.   Mod demonstrational cueing was used to avoid head flexion to the left as well as shoulder hike on the right.  Pt initially worked on task at chest level with 75% accuracy.  Raised up the table 2-3 inches with 50% accuracy.  Issued these foam pieces for pt to work on in the room.  Also educated pt on practicing opposition from finger to thumb with all digits as well.  Max assist for transfer back to the wheelchair with pt taken back to the room.  Pt left with call button and phone in reach and  chair alarm pad in place.    Therapy Documentation Precautions:  Precautions Precautions: Fall Restrictions Weight Bearing Restrictions: No  Pain: Pain Assessment Pain Score: 0-No pain  Therapy/Group: Individual Therapy  Garo Heidelberg OTR/L 12/31/2017, 4:13 PM

## 2017-12-31 NOTE — IPOC Note (Signed)
Overall Plan of Care [facility]) Patient Details Name: [name] [name] MRN: [id number] DOB: 04/02/1934  Admitting Diagnosis: Cerebral infarction  Hospital Problems: Active Problems:   Cerebral infarction [facility])   Acute ischemic left middle cerebral artery (MCA) stroke (HCC)   Hyperlipidemia LDL goal <70   Hypoalbuminemia due to protein-calorie malnutrition ([facility])   Benign prostatic hyperplasia   Diabetes mellitus type 2 in obese ([facility])   AKI (acute kidney injury) ([place])   Stage 3 chronic kidney disease ([facility])   Morbid obesity ([place])     Functional Problem List: Nursing Behavior, Bladder, Bowel, Edema, Medication Management, Pain, Safety, Skin Integrity  PT Balance, Edema, Endurance, Motor, Pain, Safety, Skin Integrity  OT Balance, Endurance, Motor, Sensory, Pain  SLP    TR         Basic ADL's: OT Dressing, Toileting(pt is bathing at his PLOF)     Advanced  ADL's: OT       Transfers: PT Bed Mobility, Bed to Chair, Car, Manufacturing systems engineer, Metallurgist: PT Ambulation, Emergency planning/management officer, Stairs     Additional Impairments: OT Fuctional Use of Upper Extremity  SLP        TR      Anticipated Outcomes Item Anticipated Outcome  Self Feeding I  Swallowing      Basic self-care  min A with dressing  Toileting  min A   Bathroom Transfers supervision to toilet, min A to tub  Bowel/Bladder  Cont B/B LBM 12/29/17  Transfers  Supervision assist with LRAD   Locomotion  Supervision assist at household level   Communication     Cognition     Pain  Denies pain, tolerable 6/10  Safety/Judgment  Refrain from falls/injuries, call light within reach, bed/chair alarm, proper footwear   Therapy Plan: PT Intensity: Minimum of 1-2 x/day ,45 to 90 minutes PT Frequency: 5 out of 7 days PT Duration Estimated Length of Stay: 18-21days  OT Intensity: Minimum of 1-2 x/day, 45 to 90 minutes OT Frequency: 5 out of 7 days, Total of 15 hours over 7 days of combined  therapies OT Duration/Estimated Length of Stay: 2.5-3 weeks      Team Interventions: Nursing Interventions Patient/Family Education, Bladder Management, Bowel Management, Pain Management, Medication Management, Skin Care/Wound Management  PT interventions Ambulation/gait training, Training and development officer, Community reintegration, Discharge planning, Disease management/prevention, Functional electrical stimulation, DME/adaptive equipment instruction, Functional mobility training, Neuromuscular re-education, Patient/family education, Pain management, Skin care/wound management, Splinting/orthotics, Psychosocial support, UE/LE Strength taining/ROM, Therapeutic Activities, Stair training, Therapeutic Exercise, UE/LE Coordination activities, Visual/perceptual remediation/compensation, Wheelchair propulsion/positioning  OT Interventions Training and development officer, Discharge planning, DME/adaptive equipment instruction, Functional mobility training, Neuromuscular re-education, Self Care/advanced ADL retraining, Therapeutic Activities, Therapeutic Exercise, UE/LE Strength taining/ROM, UE/LE Coordination activities, Pain management, Patient/family education  SLP Interventions    TR Interventions    SW/CM Interventions Discharge Planning, Psychosocial Support, Patient/Family Education   Barriers to Discharge MD  Medical stability  Nursing      PT Inaccessible home environment, Decreased caregiver support, Home environment access/layout, Incontinence, Lack of/limited family support    OT      SLP      SW       Team Discharge Planning: Destination: PT-Home ,OT- Home , SLP-  Projected Follow-up: PT-Home health PT, OT-  Home health OT, SLP-  Projected Equipment Needs: PT-Rolling walker with 5" wheels, Wheelchair cushion (measurements), Wheelchair (measurements), OT- None recommended by OT, SLP-  Equipment Details: PT- , OT-pt has needed DME at home Patient/family  involved in discharge planning:  PT- Patient,  OT-Patient, SLP-   MD ELOS: 16-19 days. Medical Rehab Prognosis:  Good  Assessment: 22 year oldRH-malewith history of seizure disorder, T2DM,LUEtremors, recurrent UTIs, questionable TIA event last year; who was admitted on 12/18/2017 with right-sided weakness, dizziness and numbness. MRI/MRA brain showed multiple small acute infarct in left MCA territory and left occipital lobe, acute lacunar infarct right centrum semiovale and moderate left P2 and possible left A1 stenosis. MRI cervical spine showed moderate DDD most severe C5/C6with neural foraminal stenosis.2D echo showed EF of 50 to 55% with moderate concentric hypertrophy,akinesis of input septal myocardium and no SOE.Carotid Dopplers done revealing left 80 to 99% ICA stenosis. Dr. Doren Custard consulted for input and recommended left carotid endarterectomy to reduce risk of further strokes.Dr. Annette Stable consulted for input and did not feel that surgical intervention needed at this time. He underwent left CEA on 10/08/2019and to continue DAPT for 3 weeks then Plavix alone. Patient with deficits in mobility and self-care tasks. Will set goals for Min A/Supervision with PT/OT.  See Team Conference Notes for weekly updates to the plan of care

## 2017-12-31 NOTE — Evaluation (Signed)
Occupational Therapy Assessment and Plan  Patient Details  Name: [name] [name] MRN: [id number] Date of Birth: November 13, 1934  OT Diagnosis: hemiplegia affecting dominant side, lumbago (low back pain) and muscle weakness (generalized) Rehab Potential: Rehab Potential (ACUTE ONLY): Good ELOS: 2.5-3 weeks   Today's Date: 12/31/2017 OT Individual Time: 6720-9470 OT Individual Time Calculation (min): 75 min     Problem List:  Patient Active Problem List   Diagnosis Date Noted  . Hyperlipidemia LDL goal <70   . Hypoalbuminemia due to protein-calorie malnutrition ([place])   . Benign prostatic hyperplasia   . Diabetes mellitus type 2 in obese ([place])   . AKI (acute kidney injury) ([place])   . Stage 3 chronic kidney disease ([place])   . Morbid obesity ([place])   . Acute ischemic left middle cerebral artery (MCA) stroke ([place]) 12/30/2017  . Cerebral infarction ([place])   . Cerebral embolism with cerebral infarction 12/20/2017  . CVA (cerebral vascular accident) ([place]) 12/20/2017  . Acute ischemic stroke ([place])   . History of CVA (cerebrovascular accident)   . Parkinson disease ([place])   . Diastolic dysfunction   . Hypokalemia   . Right sided weakness 12/18/2017  . Chronic venous insufficiency 05/05/2017  . .0... 12/21/2016  . Weakness 12/21/2016  . Contracture of muscle of right upper arm 12/21/2016  . UTI (urinary tract infection) 12/21/2016  . Seizures ([place])   . Left knee pain 09/06/2015  . Hyperlipidemia associated with type 2 diabetes mellitus ([place]) 09/06/2015  . Tremor 09/06/2015  . Urinary frequency 09/06/2015  . Acute urinary retention 08/12/2015  . Hyperkalemia 08/10/2015  . Acute renal failure ([place]) 08/10/2015  . Acute bilateral obstructive uropathy 08/10/2015  . HTN (hypertension) 08/10/2015  . Diabetes mellitus, type 2 ([place]) 08/10/2015    Past Medical History:  Past Medical History:  Diagnosis Date  . Arthritis   . Diabetes mellitus without complication ([place])    type 2  . Prostate  disease   . Renal calculi   . Renal disorder    kidney stones  . Seizures ([place])    Past Surgical History:  Past Surgical History:  Procedure Laterality Date  . CYSTOSCOPY W/ URETERAL STENT PLACEMENT Bilateral 08/10/2015   Procedure: CYSTOSCOPY WITH BILATERAL RETROGRADE PYELOGRAM/BILATERAL URETERAL STENT PLACEMENT;  Surgeon: Kathie Rhodes, MD;  [facility]: [facility];  Service: Urology;  Laterality: Bilateral;  . CYSTOSCOPY WITH RETROGRADE PYELOGRAM, URETEROSCOPY AND STENT PLACEMENT Bilateral 09/02/2015   Procedure: CYSTOSCOPY BILATERAL STENT REMOVAL BILATERAL RETROGRADE PYELOGRAM, BILATERAL URETEROSCOPY AND BILATERAL STENT PLACEMENT;  Surgeon: Kathie Rhodes, MD;  [facility];  Service: Urology;  Laterality: Bilateral;  . ENDARTERECTOMY Left 12/21/2017   Procedure: Left ENDARTERECTOMY CAROTID;  Surgeon: Angelia Mould, MD;  Location: [facility];  Service: Vascular;  Laterality: Left;  . HOLMIUM LASER APPLICATION Bilateral 9/62/8366   Procedure: BILATERAL HOLMIUM LASER  LITHO ;  Surgeon: Kathie Rhodes, MD;  [facility];  Service: Urology;  Laterality: Bilateral;  . JOINT REPLACEMENT Right 2007   hip, Dr. Alvan Dame  . PATCH ANGIOPLASTY Left 12/21/2017   Procedure: PATCH ANGIOPLASTY;  Surgeon: Angelia Mould, MD;  Location: [facility];  Service: Vascular;  Laterality: Left;  . TONSILLECTOMY Bilateral    70 years ago    [name] is an 83 year old RH-male with history of seizure disorder, T2DM, LUE tremors, recurrent UTIs, questionable TIA event last year; who was admitted on 12/18/2017 with right-sided weakness, dizziness and numbness.  MRI/MRA brain showed multiple small acute  infarct in left MCA territory and left occipital lobe, acute lacunar infarct right centrum semiovale and moderate left P2 and possible left A1 stenosis.  MRI cervical spine showed moderate DDD most severe C5/C6 with neural foraminal stenosis.  2D echo showed EF of 50 to 55% with  moderate concentric hypertrophy, akinesis of input septal myocardium and no SOE.  Carotid Dopplers done revealing left 80 to 99% ICA stenosis.    Dr. Doren Custard consulted for input and recommended left carotid endarterectomy to reduce risk of further strokes. Dr Annette Stable consulted for input and did not feel that surgical intervention needed at this time.  He underwent left CEA on 12/21/2017 and to continue DAPT for 3 weeks then Plavix alone.  Patient with deficits in mobility and self-care tasks.  CIR recommended for follow-up therapy   Patient transferred to CIR on 12/30/2017 .    Patient currently requires max with basic self-care skills secondary to muscle weakness, decreased cardiorespiratoy endurance, decreased coordination and decreased sitting balance and decreased standing balance, hemiparesis.   Prior to hospitalization, patient could complete toileting with modified independent ., UB dressing mod I, LB dressing min A, bathing max A.  Patient will benefit from skilled intervention to increase independence with basic self-care skills prior to discharge home with care partner.  Anticipate patient will require minimal physical assistance and follow up home health.  OT - End of Session Activity Tolerance: Tolerates 10 - 20 min activity with multiple rests Endurance Deficit: Yes OT Assessment Rehab Potential (ACUTE ONLY): Good OT Patient demonstrates impairments in the following area(s): Balance;Endurance;Motor;Sensory;Pain OT Basic ADL's Functional Problem(s): Dressing;Toileting(pt is bathing at his PLOF) OT Transfers Functional Problem(s): Toilet;Tub/Shower OT Additional Impairment(s): Fuctional Use of Upper Extremity OT Plan OT Intensity: Minimum of 1-2 x/day, 45 to 90 minutes OT Frequency: 5 out of 7 days;Total of 15 hours over 7 days of combined therapies OT Duration/Estimated Length of Stay: 2.5-3 weeks OT Treatment/Interventions: Balance/vestibular training;Discharge planning;DME/adaptive  equipment instruction;Functional mobility training;Neuromuscular re-education;Self Care/advanced ADL retraining;Therapeutic Activities;Therapeutic Exercise;UE/LE Strength taining/ROM;UE/LE Coordination activities;Pain management;Patient/family education OT Self Feeding Anticipated Outcome(s): I OT Basic Self-Care Anticipated Outcome(s): min A with dressing OT Toileting Anticipated Outcome(s): min A OT Bathroom Transfers Anticipated Outcome(s): supervision to toilet, min A to tub OT Recommendation Patient destination: Home Follow Up Recommendations: Home health OT Equipment Recommended: None recommended by OT Equipment Details: pt has needed DME at home   Skilled Therapeutic Intervention Pt seen for initial evaluation and ADL training with a focus on functional mobility and balance.  Explained role of OT, discussed pt's goals. Pt explained the amount of A he has had at home and will have at home.  Pt needed to toilet, he completed mod A stand pivot transfer to toilet and was able to have a BM.  He was not able to coordinate use of R hand to self cleanse and he has an IV on L, so assisted pt with cleansing.  Despite the fact that his wife fully bathes him with total A, pt was able to bathe seated with mod A.  He has very limited shoulder ROM so he needs max A to don shirt,  Total A with pants.  When pt stood from toilet, he yelled out in pain stating his low back was "killing" him. RN aware. Pt transferred back to w/c with mod A and back pain resolved.  Nursing may need to use a stedy if pt is very fatigued.   Discussed LOS and estimated goals. Pt resting in w/c with  chair alarm on and call light in reach.  OT Evaluation Precautions/Restrictions  Precautions Precautions: Fall Restrictions Weight Bearing Restrictions: No    Vital Signs Therapy Vitals Pulse Rate: 96 Resp: 18 BP: 132/66 Patient Position (if appropriate): Lying Oxygen Therapy SpO2: 90 % O2 Device: Room Air Pain Pain  Assessment Pain Score: 0-No pain(no pain at rest, but c/o intense back pain when standing) Home Living/Prior Functioning Home Living Family/patient expects to be discharged to:: Private residence Living Arrangements: Spouse/significant other, Children Available Help at Discharge: Family, Friend(s), Available 24 hours/day Type of Home: House Home Access: Stairs to enter Technical brewer of Steps: 7 Entrance Stairs-Rails: Left, Right Home Layout: One level Bathroom Shower/Tub: Chiropodist: Standard Additional Comments: shower has safety bars   Lives With: Spouse, Family Prior Function Level of Independence: Independent with homemaking with ambulation, Requires assistive device for independence, Needs assistance with ADLs Bath: Total Dressing: Minimal  Able to Take Stairs?: Yes Driving: Yes Vocation: Retired Comments: goes to church regularly  ADL ADL Grooming: Setup Where Assessed-Grooming: Chair Upper Body Bathing: Moderate assistance Where Assessed-Upper Body Bathing: Other (Comment)(toilet) Lower Body Bathing: Maximal assistance Where Assessed-Lower Body Bathing: Other (Comment)(toilet) Upper Body Dressing: Maximal assistance Lower Body Dressing: Dependent Toileting: Maximal assistance Where Assessed-Toileting: Glass blower/designer: Moderate assistance Toilet Transfer Method: Stand pivot Toilet Transfer Equipment: Raised toilet seat Social research officer, government: Not assessed Vision Baseline Vision/History: Wears glasses Wears Glasses: At all times Patient Visual Report: No change from baseline Eye Alignment: Within Functional Limits Ocular Range of Motion: Within Functional Limits Alignment/Gaze Preference: Within Defined Limits Perception  Perception: Within Functional Limits Praxis Praxis: Intact Cognition Overall Cognitive Status: Within Functional Limits for tasks assessed Arousal/Alertness: Awake/alert Orientation Level:  Person;Place;Situation Person: Oriented Place: Oriented Situation: Oriented Year: 2019 Month: October Day of Week: Correct Memory: Appears intact Immediate Memory Recall: Sock;Blue;Bed Memory Recall: Sock;Blue;Bed Memory Recall Sock: Without Cue Memory Recall Blue: Without Cue Memory Recall Bed: Without Cue Sustained Attention: Appears intact Awareness: Appears intact Safety/Judgment: Appears intact Sensation Sensation Light Touch: Impaired by gross assessment(RUE) Hot/Cold: (P) Not tested Proprioception: Impaired by gross assessment(RUE and RLE) Stereognosis: (P) Impaired by gross assessment Coordination Gross Motor Movements are Fluid and Coordinated: No Fine Motor Movements are Fluid and Coordinated: No Coordination and Movement Description: ataxia, decreased speed and amplitude of movement on the R UE Finger Nose Finger Test: (P) ataxic decreased coorinaiton and speed on the R Motor  Motor Motor: Hemiplegia;Abnormal postural alignment and control Motor - Skilled Clinical Observations: R hemiplegia UE >LE. baseline tremor from PD Mobility  Bed Mobility Bed Mobility: Rolling Right;Rolling Left;Supine to Sit;Sit to Supine Rolling Right: Supervision/verbal cueing Rolling Left: Supervision/Verbal cueing Supine to Sit: Moderate Assistance - Patient 50-74% Sit to Sidelying Left: Moderate Assistance - Patient 50-74% Transfers Sit to Stand: Moderate Assistance - Patient 50-74%;Minimal Assistance - Patient > 75%  Trunk/Postural Assessment  Cervical Assessment Cervical Assessment: Within Functional Limits Thoracic Assessment Thoracic Assessment: Exceptions to [facility] Lumbar Assessment Lumbar Assessment: Exceptions to [facility] Postural Control Postural Control: Deficits on evaluation(R lateral lean )  Balance Dynamic Sitting Balance Dynamic Sitting - Level of Assistance: 4: Min assist Static Standing Balance Static Standing - Level of Assistance: 4: Min assist Dynamic Standing  Balance Dynamic Standing - Level of Assistance: 2: Max assist Extremity/Trunk Assessment RUE Assessment Passive Range of Motion (PROM) Comments: sh flexion to 80 Active Range of Motion (AROM) Comments: sh flexion to 80 (within PROM) General Strength Comments: 3+/5  RUE Body System: Neuro Brunstrum  levels for arm and hand: Arm Brunstrum level for arm: Stage V Relative Independence from Synergy(minimal spacticity in bicep) LUE Assessment LUE Assessment: Within Functional Limits Passive Range of Motion (PROM) Comments: sh flexion to 80 Active Range of Motion (AROM) Comments: sh flexion to 80 General Strength Comments: 4/5, resting tremor     Refer to Care Plan for Long Term Goals  Recommendations for other services: None    Discharge Criteria: Patient will be discharged from OT if patient refuses treatment 3 consecutive times without medical reason, if treatment goals not met, if there is a change in medical status, if patient makes no progress towards goals or if patient is discharged from hospital.  The above assessment, treatment plan, treatment alternatives and goals were discussed and mutually agreed upon: by patient  [facility] 12/31/2017, 12:06 PM

## 2017-12-31 NOTE — Plan of Care (Signed)
  Problem: RH SAFETY Goal: RH STG ADHERE TO SAFETY PRECAUTIONS W/ASSISTANCE/DEVICE Description STG Adhere to Safety Precautions With mod Assistance/Device.  Outcome: Progressing Goal: RH STG DECREASED RISK OF FALL WITH ASSISTANCE Description STG Decreased Risk of Fall With mod  Assistance.  Outcome: Progressing  Call light within reach, bed/chair alarm, protective footwear

## 2017-12-31 NOTE — Progress Notes (Signed)
Social Work  Social Work Assessment and Plan  Patient Details  Name: [name] [name] MRN: [id number] Date of Birth: Dec 03, 1934  Today's Date: 12/31/2017  Problem List:  Patient Active Problem List   Diagnosis Date Noted  . Hyperlipidemia LDL goal <70   . Hypoalbuminemia due to protein-calorie malnutrition ([place])   . Benign prostatic hyperplasia   . Diabetes mellitus type 2 in obese ([place])   . AKI (acute kidney injury) ([place])   . Stage 3 chronic kidney disease ([place])   . Morbid obesity ([place])   . Acute ischemic left middle cerebral artery (MCA) stroke ([place]) 12/30/2017  . Cerebral infarction ([place])   . Cerebral embolism with cerebral infarction 12/20/2017  . CVA (cerebral vascular accident) ([place]) 12/20/2017  . Acute ischemic stroke ([place])   . History of CVA (cerebrovascular accident)   . Parkinson disease ([place])   . Diastolic dysfunction   . Hypokalemia   . Right sided weakness 12/18/2017  . Chronic venous insufficiency 05/05/2017  . .0... 12/21/2016  . Weakness 12/21/2016  . Contracture of muscle of right upper arm 12/21/2016  . UTI (urinary tract infection) 12/21/2016  . Seizures ([place])   . Left knee pain 09/06/2015  . Hyperlipidemia associated with type 2 diabetes mellitus ([place]) 09/06/2015  . Tremor 09/06/2015  . Urinary frequency 09/06/2015  . Acute urinary retention 08/12/2015  . Hyperkalemia 08/10/2015  . Acute renal failure ([place]) 08/10/2015  . Acute bilateral obstructive uropathy 08/10/2015  . HTN (hypertension) 08/10/2015  . Diabetes mellitus, type 2 ([place]) 08/10/2015   Past Medical History:  Past Medical History:  Diagnosis Date  . Arthritis   . Diabetes mellitus without complication ([place])    type 2  . Prostate disease   . Renal calculi   . Renal disorder    kidney stones  . Seizures ([place])    Past Surgical History:  Past Surgical History:  Procedure Laterality Date  . CYSTOSCOPY W/ URETERAL STENT PLACEMENT Bilateral 08/10/2015   Procedure: CYSTOSCOPY WITH  BILATERAL RETROGRADE PYELOGRAM/BILATERAL URETERAL STENT PLACEMENT;  Surgeon: Kathie Rhodes, MD;  [facility];  Service: Urology;  Laterality: Bilateral;  . CYSTOSCOPY WITH RETROGRADE PYELOGRAM, URETEROSCOPY AND STENT PLACEMENT Bilateral 09/02/2015   Procedure: CYSTOSCOPY BILATERAL STENT REMOVAL BILATERAL RETROGRADE PYELOGRAM, BILATERAL URETEROSCOPY AND BILATERAL STENT PLACEMENT;  Surgeon: Kathie Rhodes, MD;  [facility];  Service: Urology;  Laterality: Bilateral;  . ENDARTERECTOMY Left 12/21/2017   Procedure: Left ENDARTERECTOMY CAROTID;  Surgeon: Angelia Mould, MD;  Location: [facility];  Service: Vascular;  Laterality: Left;  . HOLMIUM LASER APPLICATION Bilateral 5/73/2202   Procedure: BILATERAL HOLMIUM LASER  LITHO ;  Surgeon: Kathie Rhodes, MD;  [facility];  Service: Urology;  Laterality: Bilateral;  . JOINT REPLACEMENT Right 2007   hip, Dr. Alvan Dame  . PATCH ANGIOPLASTY Left 12/21/2017   Procedure: PATCH ANGIOPLASTY;  Surgeon: Angelia Mould, MD;  Location: [facility];  Service: Vascular;  Laterality: Left;  . TONSILLECTOMY Bilateral    70 years ago   Social History:  reports that he has never smoked. He has never used smokeless tobacco. He reports that he does not drink alcohol or use drugs.  Family / Support Systems Marital Status: Single Patient Roles: Partner Spouse/Significant Other: "together" with partner, Miguel Rota, x 20 yrs.  (C) [phone] Children: [name]'s son, Durene Fruits, also in the home but is legally blind and "has mental health issues." Anticipated Caregiver: [name]: home [phone]) cell [phone]) Ability/Limitations of Caregiver: [name] cannot provide much physical assistance and states, "  I can't lift him." Caregiver Availability: 24/7 Family Dynamics: [name] states she will "do all I can for him.Marland KitchenMarland KitchenI told him to get as much as he can from y'all."  Pt describes [name] as very supportive..  Social History Preferred language: English Religion:  [employer] Cultural Background: NA Read: Yes Write: Yes Employment Status: Retired Freight forwarder Issues: Yes, known legal issues which will prevent SNF option if needed. Guardian/Conservator: None - per MD, pt is capable of making decisions on his own behalf.   Abuse/Neglect Abuse/Neglect Assessment Can Be Completed: Yes Physical Abuse: Denies Verbal Abuse: Denies Sexual Abuse: Denies Exploitation of patient/patient's resources: Denies Self-Neglect: Denies  Emotional Status Pt's affect, behavior adn adjustment status: Pt very pleasant and reports he is very motivated for therapies and eager to regain his independence.  Pt denies any significant emotional distress.  He is very matter-of-fact about what he needs assist with from this SW and what his goals are for CIR.  Will monitor mood and refer for neuropsychology as indicated. Recent Psychosocial Issues: None Pyschiatric History: None Substance Abuse History: None  Patient / Family Perceptions, Expectations & Goals Pt/Family understanding of illness & functional limitations: Pt and s/o with good, general understanding of his CVA and current functional limitations/ need for CIR.  Good understanding that d/c plan will be home as SNF is not an option. Premorbid pt/family roles/activities: Pt was independent, driving PTA. Anticipated changes in roles/activities/participation: Pt may need light, physical assistance and s/o hopeful she can meet those care needs. Pt/family expectations/goals: "The only think I need from you is to get them ([place]) back for me.  I think I'm gonna do pretty well here."  [employer]: None Premorbid Home Care/DME Agencies: Other (Comment)([facility]) Transportation available at discharge: Pt has concerns about getting home and inside the home.  Both he and [name] requesting that he be brought home via ambulance. Resource referrals recommended:  Neuropsychology  Discharge Planning Living Arrangements: Spouse/significant other, Children Support Systems: Spouse/significant other, Children, Church/faith community Type of Residence: Private residence Insurance Resources: Medicare([employer] Medicare) Financial Resources: Radio broadcast assistant Screen Referred: No Living Expenses: Rent Money Management: Patient Does the patient have any problems obtaining your medications?: No Home Management: Pt and Restaurant manager, fast food Preliminary Plans: Pt to return home with [name] and her adult son. Social Work Anticipated Follow Up Needs: HH/OP Expected length of stay: 2-3 weeks  Clinical Impression Pleasant gentleman here following a CVA at home.  Lives with s/o and her adult son - both are blind and she can provide only light, physical assistance.  Pt very motivated and optimistic about his CVA recovery.  States he "only needs help" with restart of HH and ambulance transport home.  Pt denies any significant emotional distress.  Will follow for support and d/c planning needs.  Keo Schirmer 12/31/2017, 3:40 PM

## 2017-12-31 NOTE — Progress Notes (Signed)
[place] PHYSICAL MEDICINE & REHABILITATION PROGRESS NOTE  Subjective/Complaints: Patient seen laying in bed this morning.  He states he slept well overnight.  He states he is ready to begin therapies today.  ROS: Denies CP, SOB, nausea, vomiting, diarrhea.  Objective: Vital Signs: Blood pressure 132/66, pulse 96, temperature 97.6 F (36.4 C), temperature source Oral, resp. rate 18, height 5\' 9"  (1.753 m), weight 106.2 kg, SpO2 90 %. No results found. Recent Labs    12/31/17 0450  WBC 6.2  HGB 14.7  HCT 45.8  PLT 210   Recent Labs    12/31/17 0450  NA 138  K 3.6  CL 105  CO2 23  GLUCOSE 144*  BUN 25*  CREATININE 1.85*  CALCIUM 9.0    Physical Exam: BP 132/66 (BP Location: Right Arm)   Pulse 96   Temp 97.6 F (36.4 C) (Oral)   Resp 18   Ht 5\' 9"  (1.753 m)   Wt 106.2 kg   SpO2 90%   BMI 34.57 kg/m  Constitutional: Well-developed.  Obese.  NAD. HENT: Normocephalic.  Atraumatic. Eyes:EOMI.  No discharge.   Cardiovascular:RRR.  No JVD. Murmurheard. Respiratory:Effort normal.  Clear. GG:EZMO. He exhibitsno distension. There isno tenderness.  Musculoskeletal: He exhibitsedema(min edema bilateral hands and feet.).  Neurological: He isalertand oriented.  Dysarthria.   Able to follow basic commands without difficulty.  Motor: RUE 4-/5 prox to distal.  LUE 4+/5 proximal to distal.  RLE 3+/5 prox to distal.  LLE 4+/5 proximal to distal.  Pill-rolling tremor left upper extremity Skin: Skin iswarmand dry. He isnot diaphoretic.  Psychiatric: He has anormal mood and affect. Hisbehavior is normal.  Assessment/Plan: 1. Functional deficits secondary to left > right bi-cerebral infarcts which require 3+ hours per day of interdisciplinary therapy in a comprehensive inpatient rehab setting.  Physiatrist is providing close team supervision and 24 hour management of active medical problems listed below.  Physiatrist and rehab team continue to assess  barriers to discharge/monitor patient progress toward functional and medical goals  Care Tool:  Bathing        Body parts bathed by helper: Front perineal area     Bathing assist Assist Level: Maximal Assistance - Patient 24 - 49%     Upper Body Dressing/Undressing Upper body dressing        Upper body assist      Lower Body Dressing/Undressing Lower body dressing            Lower body assist       Toileting Toileting    Toileting assist Assist for toileting: Maximal Assistance - Patient 25 - 49%     Transfers Chair/bed transfer  Transfers assist           Locomotion Ambulation   Ambulation assist              Walk 10 feet activity   Assist           Walk 50 feet activity   Assist           Walk 150 feet activity   Assist           Walk 10 feet on uneven surface  activity   Assist           Wheelchair     Assist               Wheelchair 50 feet with 2 turns activity    Assist  Wheelchair 150 feet activity     Assist            Medical Problem List and Plan: 1.Right HP and functional deficitssecondary to left > right bi-cerebral infarcts  Begin CIR  Notes reviewed- multiple infarcts, images reviewed- bilateral left greater than right cerebral infarcts, labs reviewed 2. DVT Prophylaxis/Anticoagulation: Pharmaceutical:Heparin 3. Pain Management:N/A 4. Mood:LCSW to follow for evaluation and support. 5. Neuropsych: This patientiscapable of making decisions on hisown behalf. 6. Skin/Wound Care:Monitor wound daily for healing. Maintain adequate nutritional and hydration status  7. Fluids/Electrolytes/Nutrition:Monitor I's and O's.  8.S/PLeft CNO:BSJG x3 weeks followed by Plavix alone. Continue statin 9.CKD: Continue to monitor with serial checks. Encourage fluid intake.  Now with AKI, creatinine 1.85 on 10/18  Encourage fluids  Labs ordered for  Monday 10.Seizure disorder: Cont Keppra twice daily 11.T2DM: Hgb Aic- 7.2.Was on metformin and Januvia at home prior to admission Continuetomonitor blood sugars AC/HS basis. Use sliding scale insulin for tighter blood sugar control.  Resumed Januvia. Continue hold metformin due to baseline serum creatinine 1.7 Will considerAmaryl..   Monitor with increased mobility 12.BPH: Continue Flomax and Proscar. 13.E coli UTI: Completed treatment 10/10 D/c condom cath, voiding trial. 14.  Hypoalbuminemia  Supplement initiated on 10/18 15.  Morbid obesity  Encourage weight loss  LOS: 1 days A FACE TO FACE EVALUATION WAS PERFORMED  Ankit [name] 12/31/2017, 8:48 AM

## 2017-12-31 NOTE — Progress Notes (Signed)
Patient information reviewed and entered into eRehab system by Lindbergh Winkles, RN, CRRN, PPS Coordinator.  Information including medical coding, functional ability and quality indicators will be reviewed and updated through discharge.     Per nursing patient was given "Data Collection Information Summary for Patients in Inpatient Rehabilitation Facilities with attached "Privacy Act Statement-Health Care Records" upon admission.  

## 2018-01-01 ENCOUNTER — Inpatient Hospital Stay (HOSPITAL_COMMUNITY): Payer: Medicare HMO | Admitting: Physical Therapy

## 2018-01-01 ENCOUNTER — Inpatient Hospital Stay (HOSPITAL_COMMUNITY): Payer: Medicare HMO

## 2018-01-01 LAB — GLUCOSE, CAPILLARY
GLUCOSE-CAPILLARY: 136 mg/dL — AB (ref 70–99)
GLUCOSE-CAPILLARY: 132 mg/dL — AB (ref 70–99)
Glucose-Capillary: 155 mg/dL — ABNORMAL HIGH (ref 70–99)
Glucose-Capillary: 123 mg/dL — ABNORMAL HIGH (ref 70–99)

## 2018-01-01 NOTE — Progress Notes (Signed)
Physical Therapy Session Note  Patient Details  Name: [name] [name] MRN: [id number] Date of Birth: Aug 12, 1934  Today's Date: 01/01/2018 PT Individual Time: 1005-1057 PT Individual Time Calculation (min): 52 min   Short Term Goals: Week 1:  PT Short Term Goal 1 (Week 1): Pt will perform bed mobility with min assist  PT Short Term Goal 2 (Week 1): Pt will ambulate 33ft with min assist and LRAD  PT Short Term Goal 3 (Week 1): Pt will perform WC<>bed transfer with min assist consistently PT Short Term Goal 4 (Week 1): PT will initiate stair training   Skilled Therapeutic Interventions/Progress Updates:   Pt in w/c and agreeable to therapy, denies pain at rest. Session focused on functional mobility and LE strengthening/ROM. Total assist w/c transport to/from therapy gym. Mod assist squat/stand pivot transfer to/from NuStep w/ increased time. C/o back pain when in standing, unable to reach full upright/erect posture and LEs in flexed position, although able to take a few pivot steps during transfer. Performed NuStep 5 min x2 @ level 2 for LE strengthening and gentle ROM. LUE assist on first bout, LEs only on 2nd bout to incorporate more LE muscle activation. Frequent tactile and manual cues for neutral BLE alignment. Performed BLE strengthening/stretching from w/c including passive hamstring stretch 5x30 sec, passive gastroc stretch 5x30 sec, heel slides w/ orange theraband resistance 2x10, and LAQs 2x10. Attempted to instruct pt on manual w/c mobility and work on self-propulsion, however unable to reach floor w/ BLEs and RUE w/ difficulty maintaining contact on w/c rim. Returned to room and ended session in w/c, all needs in reach.   Therapy Documentation Precautions:  Precautions Precautions: Fall Restrictions Weight Bearing Restrictions: No  Therapy/Group: Individual Therapy  Coree Brame K Eber Ferrufino 01/01/2018, 11:03 AM

## 2018-01-01 NOTE — Progress Notes (Signed)
Occupational Therapy Session Note  Patient Details  Name: [name] [name] MRN: [id number] Date of Birth: 13-Aug-1934  Today's Date: 01/01/2018 OT Individual Time: 8127-5170 OT Individual Time Calculation (min): 55 min    Short Term Goals: Week 1:  OT Short Term Goal 1 (Week 1): Pt will be able to sit to stand from commode with min A. OT Short Term Goal 2 (Week 1): Pt will be able to self cleanse with min A post toileting. OT Short Term Goal 3 (Week 1): Pt will be able to don pants over feet with max A.   OT Short Term Goal 4 (Week 1): Pt will be able to don shirt with mod A.   OT Short Term Goal 5 (Week 1): Pt will be able to use RUE to pull pants over hips with mod A.  Skilled Therapeutic Interventions/Progress Updates:    1:1. Pt reporting low back pain mild at rest but flaring up with mobility. Pt declines medication, however willing to try hot pack. OT alerts RN to ask for K pad for low back pain and focus of remainder of session on RUE. Pt completes RUE [facility] activities of turning over blocks, stacking blocks based on color and and reaching to return blocks back to bag with min tactile facilitation of reaching and VC for decreasing shoulder height. Pt completes BUE coordination activity for NMR with familiar objects using knife and fork to cut tan putty into pieces with VC/demonstration cues for normal grasp on fork using red foam handle. Pt demo improved control over fork with RUE with foad handle. Pt then picks up pieces of putty using fork/foam handle to place back into container. Exited session iwht pt seated in w/c, exit alarm on and call light in reach  Therapy Documentation Precautions:  Precautions Precautions: Fall Restrictions Weight Bearing Restrictions: No General:   Vital Signs:    Therapy/Group: Individual Therapy  Tonny Branch 01/01/2018, 2:28 PM

## 2018-01-01 NOTE — Progress Notes (Signed)
Subjective: Patient feels well.  He has no complaints.  He is sitting up.  Has had breakfast.  Objective:BP 138/80 (BP Location: Left Arm)   Pulse 88   Temp 97.9 F (36.6 C) (Oral)   Resp 16   Ht 5\' 9"  (1.753 m)   Wt 106.2 kg   SpO2 92%   BMI 34.57 kg/m   Elderly male in no acute distress.  HEENT exam atraumatic, normocephalic, extractor muscles are intact.  Neck is supple without lymphadenopathy chest clear to auscultation cardiac exam S1-S2 regular.  Abdominal exam active bowel sounds, soft, nontender extremities without edema.  Assessment and plan: 1.  Right hemiparesis and functional deficits secondary to left greater than right cerebral infarcts.  Continue inpatient rehab DVT prophylaxis with heparin 2.  Fluid electrolyte nutrition: Basic Metabolic Panel:    Component Value Date/Time   NA 138 12/31/2017 0450   NA 142 09/06/2015 1003   K 3.6 12/31/2017 0450   CL 105 12/31/2017 0450   CO2 23 12/31/2017 0450   BUN 25 (H) 12/31/2017 0450   BUN 25 09/06/2015 1003   CREATININE 1.85 (H) 12/31/2017 0450   GLUCOSE 144 (H) 12/31/2017 0450   CALCIUM 9.0 12/31/2017 0450  . . 3 2 diabetes. CBG (last 3)  Recent Labs    12/31/17 1621 12/31/17 2100 01/01/18 0659  GLUCAP 172* 137* 136*   We will continue to monitor now. 4.  Recent carotid endarterectomy.  Will continue  Statin. 5.  Chronic kidney disease see creatinine above. 6.  Seizure disorder on Keppra 7.  E. coli UTI status post antibiotics.

## 2018-01-01 NOTE — Progress Notes (Signed)
Physical Therapy Session Note  Patient Details  Name: [name] [name] MRN: [id number] Date of Birth: 03/20/1934  Today's Date: 01/01/2018 PT Individual Time: 0805-0915 PT Individual Time Calculation (min): 70 min   Short Term Goals: Week 1:  PT Short Term Goal 1 (Week 1): Pt will perform bed mobility with min assist  PT Short Term Goal 2 (Week 1): Pt will ambulate 43f with min assist and LRAD  PT Short Term Goal 3 (Week 1): Pt will perform WC<>bed transfer with min assist consistently PT Short Term Goal 4 (Week 1): PT will initiate stair training   Skilled Therapeutic Interventions/Progress Updates: Pt presented in bed agreeable to therapy. Pt requesting to use toilet. Performed bed mobility mod A with use of bed rail and modA for BLE management. Performed squat pivot to BSC modA (+BM). Performed STS with RW with minA and PTA performed peri-care total A. LB clothing management maxA with pt able to maintain standing approx 3 min. Performed stand pivot with RW to w/c modA and transported to rehab gym total A for energy conservation. Performed stand pivot to mat with RW minA with tactile cues for erect posture and sequencing. Pt participated in standing balance activities including reaching/placing clothespins using for L and RUE. Participated in horseshoes for standing balance/tolerance, cues for increasing erect posture when able as noted increased foward flexion with fatigue. Performed ambulatory transfer to w/c with RW with minA and verbal cues for RLE placement. Pt transported back to room and remained in w/c at end of session with call bell within reach and needs met.      Therapy Documentation Precautions:  Precautions Precautions: Fall Restrictions Weight Bearing Restrictions: No General:   Vital Signs: Therapy Vitals Temp: 98 F (36.7 C) Temp Source: Oral Pulse Rate: 62 Resp: 16 BP: (!) 140/56 Patient Position (if appropriate): Sitting Oxygen Therapy SpO2: 96 % O2 Device:  Room Air Pain:   Mobility:   Locomotion :    Trunk/Postural Assessment :    Balance:   Exercises:   Other Treatments:      Therapy/Group: Individual Therapy  Deforrest Bogle 01/01/2018, 4:32 PM

## 2018-01-02 ENCOUNTER — Inpatient Hospital Stay (HOSPITAL_COMMUNITY): Payer: Medicare HMO

## 2018-01-02 LAB — GLUCOSE, CAPILLARY
GLUCOSE-CAPILLARY: 153 mg/dL — AB (ref 70–99)
GLUCOSE-CAPILLARY: 167 mg/dL — AB (ref 70–99)
Glucose-Capillary: 129 mg/dL — ABNORMAL HIGH (ref 70–99)
Glucose-Capillary: 167 mg/dL — ABNORMAL HIGH (ref 70–99)

## 2018-01-02 NOTE — Progress Notes (Signed)
Occupational Therapy Session Note  Patient Details  Name: [name] [name] MRN: [id number] Date of Birth: Jan 20, 1935  Today's Date: 01/02/2018 OT Individual Time: 1300-1355 OT Individual Time Calculation (min): 55 min    Short Term Goals: Week 1:  OT Short Term Goal 1 (Week 1): Pt will be able to sit to stand from commode with min A. OT Short Term Goal 2 (Week 1): Pt will be able to self cleanse with min A post toileting. OT Short Term Goal 3 (Week 1): Pt will be able to don pants over feet with max A.   OT Short Term Goal 4 (Week 1): Pt will be able to don shirt with mod A.   OT Short Term Goal 5 (Week 1): Pt will be able to use RUE to pull pants over hips with mod A.  Skilled Therapeutic Interventions/Progress Updates:    1:1. Pt received in room seated in w/c dressed and ready to go. OT completed PROM of  Shoulder in all planes of motion to improve PROM under 90* and decrease risk of contracture. Pt demo decreased digit extension/tightness and OT facilitated extension of digits flat onto table. Pt reporting K pad helped "a lot" with back pain only reporting 1/10 pain in back. Total A to transport to dayroom in w/c. Pt completes towel glides 2x1 min per exercise for scapular/shoulder strengthening and ROM as follows with min tactile cues at scapula and min A to achieve larger ROM. Exercises as follows:  Shoulder flex/ext scap pro/retraction and elevation/depression Int/ext rotation Elbow flex/ext Horizontal ab/adduct  Pt returned to room seated in w/c with K pad applied to back for comfort, call light in reach and all needs met  Therapy Documentation Precautions:  Precautions Precautions: Fall Restrictions Weight Bearing Restrictions: No General:   Vital Signs:   Pain: Pain Assessment Pain Score: 1  Pain Location: Back ADL: ADL Grooming: Setup Where Assessed-Grooming: Chair Upper Body Bathing: Moderate assistance Where Assessed-Upper Body Bathing: Other  (Comment)(toilet) Lower Body Bathing: Maximal assistance Where Assessed-Lower Body Bathing: Other (Comment)(toilet) Upper Body Dressing: Maximal assistance Lower Body Dressing: Dependent Toileting: Maximal assistance Where Assessed-Toileting: Glass blower/designer: Moderate assistance Toilet Transfer Method: Stand pivot Toilet Transfer Equipment: Raised toilet seat Social research officer, government: Not assessed Vision   Perception    Praxis   Exercises:   Other Treatments:     Therapy/Group: Individual Therapy  Tonny Branch 01/02/2018, 1:13 PM

## 2018-01-03 ENCOUNTER — Inpatient Hospital Stay (HOSPITAL_COMMUNITY): Payer: Medicare HMO | Admitting: Occupational Therapy

## 2018-01-03 ENCOUNTER — Inpatient Hospital Stay (HOSPITAL_COMMUNITY): Payer: Medicare HMO | Admitting: Physical Therapy

## 2018-01-03 LAB — GLUCOSE, CAPILLARY
GLUCOSE-CAPILLARY: 143 mg/dL — AB (ref 70–99)
GLUCOSE-CAPILLARY: 159 mg/dL — AB (ref 70–99)
Glucose-Capillary: 85 mg/dL (ref 70–99)
Glucose-Capillary: 143 mg/dL — ABNORMAL HIGH (ref 70–99)

## 2018-01-03 NOTE — Care Management (Signed)
[facility] Individual Statement of Services  Patient Name:  [name] [name]  Date:  01/03/2018  Welcome to the [facility].  Our goal is to provide you with an individualized program based on your diagnosis and situation, designed to meet your specific needs.  With this comprehensive rehabilitation program, you will be expected to participate in at least 3 hours of rehabilitation therapies Monday-Friday, with modified therapy programming on the weekends.  Your rehabilitation program will include the following services:  Physical Therapy (PT), Occupational Therapy (OT), Speech Therapy (ST), 24 hour per day rehabilitation nursing, Therapeutic Recreaction (TR), Neuropsychology, Case Management (Social Worker), Rehabilitation Medicine, Nutrition Services and Pharmacy Services  Weekly team conferences will be held on Wednesdays to discuss your progress.  Your Social Worker will talk with you frequently to get your input and to update you on team discussions.  Team conferences with you and your family in attendance may also be held.  Expected length of stay: 2-3 weeks   Overall anticipated outcome: supervision/ min assistance  Depending on your progress and recovery, your program may change. Your Social Worker will coordinate services and will keep you informed of any changes. Your Social Worker's name and contact numbers are listed  below.  The following services may also be recommended but are not provided by the [facility] will be made to provide these services after discharge if needed.  Arrangements include referral to agencies that provide these services.  Your insurance has been verified to be:  [employer] Your primary doctor is:  Dr. Daphene Jaeger  Pertinent information will be shared with your doctor and your insurance  company.  Social Worker:  [place], [place] or ([phone]   Information discussed with and copy given to patient by: Lennart Pall, 01/03/2018, 2:01 PM

## 2018-01-03 NOTE — Progress Notes (Signed)
Occupational Therapy Session Note  Patient Details  Name: [name] [name] MRN: [id number] Date of Birth: 08-Aug-1934  Today's Date: 01/03/2018 OT Individual Time: 0802-0901 OT Individual Time Calculation (min): 59 min    Short Term Goals: Week 1:  OT Short Term Goal 1 (Week 1): Pt will be able to sit to stand from commode with min A. OT Short Term Goal 2 (Week 1): Pt will be able to self cleanse with min A post toileting. OT Short Term Goal 3 (Week 1): Pt will be able to don pants over feet with max A.   OT Short Term Goal 4 (Week 1): Pt will be able to don shirt with mod A.   OT Short Term Goal 5 (Week 1): Pt will be able to use RUE to pull pants over hips with mod A.  Skilled Therapeutic Interventions/Progress Updates:    Pt worked on toileting, bathing, dressing, and grooming tasks during session.  Mod assist for stand pivot transfers bed to the wheelchair and wheelchair to the toilet with use of the grab bar.  Total assist for toilet hygiene and clothing management to pull up and down brief.  Pt worked on bathing sit to stand at the sink.  Max assist needed for washing his upper arms and under arms secondary to limited shoulder horizontal adduction bilaterally.  Max assist for washing LB as well secondary to not being able to reach his lower legs and feet.  He was able to complete UB dressing with mod assist as well as max assist for LB dressing.  Mod assist for sit to stand at the sink to pull pants over hips.  Pt left at bedside with chair alarm in place and call button in reach.   Therapy Documentation Precautions:  Precautions Precautions: Fall Precaution Comments: right hemiparesis Restrictions Weight Bearing Restrictions: No Pain: Pain Assessment Pain Score: 0-No pain ADL: See Care Plan for some ADL details  Therapy/Group: Individual Therapy  Khori Underberg OTR/L 01/03/2018, 9:01 AM

## 2018-01-03 NOTE — Progress Notes (Signed)
[place] PHYSICAL MEDICINE & REHABILITATION PROGRESS NOTE  Subjective/Complaints: Patient seen laying in bed this morning.  He states he slept well overnight.  He states he had a good weekend.  ROS: Denies CP, SOB, nausea, vomiting, diarrhea.  Objective: Vital Signs: Blood pressure (!) 135/94, pulse 97, temperature 98 F (36.7 C), resp. rate 16, height 5\' 9"  (1.753 m), weight 106.2 kg, SpO2 91 %. No results found. No results for input(s): WBC, HGB, HCT, PLT in the last 72 hours. No results for input(s): NA, K, CL, CO2, GLUCOSE, BUN, CREATININE, CALCIUM in the last 72 hours.  Physical Exam: BP (!) 135/94 (BP Location: Left Arm)   Pulse 97   Temp 98 F (36.7 C)   Resp 16   Ht 5\' 9"  (1.753 m)   Wt 106.2 kg   SpO2 91%   BMI 34.57 kg/m  Constitutional: Well-developed.  Obese.  NAD. HENT: Normocephalic.  Atraumatic. Eyes:EOMI.  No discharge.   Cardiovascular:RRR. No JVD. Murmurheard. Respiratory:Effort normal.  Clear. GG:EZMO. He exhibitsno distension. There isno tenderness.  Musculoskeletal: He exhibitsedema(min edema bilateral hands and feet.).  Neurological: He isalertand oriented.  Dysarthria.   Able to follow basic commands without difficulty.  Motor: RUE 4/5 prox to distal.  LUE 4+/5 proximal to distal.  RLE 3+/5 prox to distal.  LLE 4+/5 proximal to distal.  Pill-rolling tremor left upper extremity Skin: Skin iswarmand dry. He isnot diaphoretic.  Psychiatric: He has anormal mood and affect. Hisbehavior is normal.  Assessment/Plan: 1. Functional deficits secondary to left > right bi-cerebral infarcts which require 3+ hours per day of interdisciplinary therapy in a comprehensive inpatient rehab setting.  Physiatrist is providing close team supervision and 24 hour management of active medical problems listed below.  Physiatrist and rehab team continue to assess barriers to discharge/monitor patient progress toward functional and medical goals  Care  Tool:  Bathing    Body parts bathed by patient: Chest, Abdomen, Front perineal area, Right upper leg, Left upper leg, Face   Body parts bathed by helper: Right arm, Left arm, Buttocks, Right lower leg, Left lower leg     Bathing assist Assist Level: Moderate Assistance - Patient 50 - 74%     Upper Body Dressing/Undressing Upper body dressing   What is the patient wearing?: Pull over shirt    Upper body assist Assist Level: Moderate Assistance - Patient 50 - 74%    Lower Body Dressing/Undressing Lower body dressing      What is the patient wearing?: Incontinence brief     Lower body assist Assist for lower body dressing: Total Assistance - Patient < 25%     Toileting Toileting    Toileting assist Assist for toileting: Moderate Assistance - Patient 50 - 74%     Transfers Chair/bed transfer  Transfers assist     Chair/bed transfer assist level: Moderate Assistance - Patient 50 - 74%     Locomotion Ambulation   Ambulation assist      Assist level: Moderate Assistance - Patient 50 - 74% Assistive device: Walker-rolling Max distance: 17   Walk 10 feet activity   Assist     Assist level: Moderate Assistance - Patient - 50 - 74% Assistive device: Walker-rolling   Walk 50 feet activity   Assist Walk 50 feet with 2 turns activity did not occur: Safety/medical concerns         Walk 150 feet activity   Assist Walk 150 feet activity did not occur: Safety/medical concerns  Walk 10 feet on uneven surface  activity   Assist Walk 10 feet on uneven surfaces activity did not occur: Safety/medical concerns         Wheelchair     Assist   Type of Wheelchair: Manual    Wheelchair assist level: Moderate Assistance - Patient 50 - 74% Max wheelchair distance: 75    Wheelchair 50 feet with 2 turns activity    Assist        Assist Level: Moderate Assistance - Patient 50 - 74%   Wheelchair 150 feet activity     Assist  Wheelchair 150 feet activity did not occur: Safety/medical concerns          Medical Problem List and Plan: 1.Right HP and functional deficitssecondary to left > right bi-cerebral infarcts  Continue CIR 2. DVT Prophylaxis/Anticoagulation: Pharmaceutical:Heparin 3. Pain Management:N/A 4. Mood:LCSW to follow for evaluation and support. 5. Neuropsych: This patientiscapable of making decisions on hisown behalf. 6. Skin/Wound Care:Monitor wound daily for healing. Maintain adequate nutritional and hydration status  7. Fluids/Electrolytes/Nutrition:Monitor I's and O's.  8.S/PLeft CMK:LKJZ x3 weeks followed by Plavix alone. Continue statin 9.CKD: Continue to monitor with serial checks. Encourage fluid intake.  Now with AKI, creatinine 1.85 on 10/18  Encourage fluids  Labs pending 10.Seizure disorder: Cont Keppra twice daily 11.T2DM: Hgb Aic- 7.2.Was on metformin and Januvia at home prior to admission Continuetomonitor blood sugars AC/HS basis. Use sliding scale insulin for tighter blood sugar control.  Resumed Januvia. Continue hold metformin due to baseline serum creatinine 1.7 Will considerAmaryl.   Labile on 10/21 12.BPH: Continue Flomax and Proscar. 13.E coli UTI: Completed treatment 10/10 D/c condom cath, voiding trial. 14.  Hypoalbuminemia  Supplement initiated on 10/18 15.  Morbid obesity  Encourage weight loss  LOS: 4 days A FACE TO FACE EVALUATION WAS PERFORMED  Ankit [name] 01/03/2018, 8:02 AM

## 2018-01-03 NOTE — Progress Notes (Signed)
Occupational Therapy Session Note  Patient Details  Name: [name] [name] MRN: [id number] Date of Birth: 1934-12-24  Today's Date: 01/03/2018 OT Individual Time: 1402-1500 OT Individual Time Calculation (min): 58 min    Short Term Goals: Week 1:  OT Short Term Goal 1 (Week 1): Pt will be able to sit to stand from commode with min A. OT Short Term Goal 2 (Week 1): Pt will be able to self cleanse with min A post toileting. OT Short Term Goal 3 (Week 1): Pt will be able to don pants over feet with max A.   OT Short Term Goal 4 (Week 1): Pt will be able to don shirt with mod A.   OT Short Term Goal 5 (Week 1): Pt will be able to use RUE to pull pants over hips with mod A.  Skilled Therapeutic Interventions/Progress Updates:    Pt completed transfer from the wheelchair to the therapy mat with mod assist stand pivot using the RW for support.   Pt then transferred to sidelying where therapist completed scapular mobilizations bilaterally.  Then pt transitioned to supine where therapist completed shoulder mobilizations for glenohumeral joint bilaterally while incorporating slight movements of internal and external rotation.  Pt with severely tight posterior capsule of the shoulder as well as tightness in the pectoral and lats.  Incorporated PROM shoulder abduction and horizontal adduction with therapist facilitation.  Also had pt hold a hula hoop in supine and work on AROM bilateral shoulder flexion and horizontal abduction adduction movements.  Finished session with transition back to sitting from supine with mod facilitation from the hemi right side.  Finished session with call button and phone in reach and pt staying up in the wheelchair at bedside.  Chair alarm in place as well.   Therapy Documentation Precautions:  Precautions Precautions: Fall Precaution Comments: right hemiparesis Restrictions Weight Bearing Restrictions: No  Pain: Pain Assessment Pain Scale: Faces Pain Score: 0-No  pain Faces Pain Scale: Hurts a little bit Pain Type: Chronic pain Pain Location: Back Pain Orientation: Lower;Medial Patients Stated Pain Goal: 0 Pain Intervention(s): Heat applied  Therapy/Group: Individual Therapy  Suleima Ohlendorf OTR/L 01/03/2018, 3:56 PM

## 2018-01-03 NOTE — Progress Notes (Signed)
Physical Therapy Session Note  Patient Details  Name: [name] [name] MRN: [id number] Date of Birth: [birth year]  Today's Date: 01/03/2018 PT Individual Time: 1324-4010 and 2725-3664 PT Individual Time Calculation (min): 40 min and 42 min Short Term Goals: Week 1:  PT Short Term Goal 1 (Week 1): Pt will perform bed mobility with min assist  PT Short Term Goal 2 (Week 1): Pt will ambulate 49f with min assist and LRAD  PT Short Term Goal 3 (Week 1): Pt will perform WC<>bed transfer with min assist consistently PT Short Term Goal 4 (Week 1): PT will initiate stair training   Skilled Therapeutic Interventions/Progress Updates: tx1: Pt presented in w/c agreeable to therapy. Pt states discomfort in back however no numerical assessment provided and did not want to request for pain meds.  Session focused on balance and endurance. Pt transported to [employer] participated in DLorisin standing for static balance and forced use R NMR. Pt noted to have improved posture during standing activity. Pt noted to have delayed scanning to R during activity. Pt transported to rehab gym hallway and participated in gait training with RW. Pt ambulated 150fx 1 and 1069f 1 with step to pattern. Cues for erect posture, and staying close to RW. Pt able to take first several steps with fair R foot clearance however decreased clearance with fatigue. Pt transported back to room and K pack placed at low back for pain management. Pt remained in w/c with call bell within reach, lunch tray set up and current needs met.   Tx2: Pt presented in w/c agreeable to therapy. Pt stating heating pad helped with LB pain. Pt transported to rehab gym and participated in standing R NMR via forced use as follows. Standing hip flexion at high/low table to fatigue, hip abd to fatigue. Pt required mod tactile cues for increased wt shifting to L while performing RLE activities. Pt then transported to day room and performed Cybex Kinetron  initially 80cm/sec x 10 cycles for reciprocal action then at 70cm/sec 3 bouts x 2 min for general strengthening. Pt then transported to rehab gym and performed stand pivot transfer to mat minA with RW. Pt stood to perform peg board activity and indicated urgency for BM. Pt returned to w/c and transported to bathroom. Performed stand pivot to toilet with wall rail and PTA provided total A for clothing management. Pt left at toilet at end of session verbalizing understanding to use call bell once completed and nsg notified of pt's status.       Therapy Documentation Precautions:  Precautions Precautions: Fall Precaution Comments: right hemiparesis Restrictions Weight Bearing Restrictions: No General:   Vital Signs: Therapy Vitals Resp: 17 Pain: Pain Assessment Pain Score: 0-No pain Mobility:   Locomotion :    Trunk/Postural Assessment :    Balance:   Exercises:   Other Treatments:      Therapy/Group: Individual Therapy  Azure Barrales 01/03/2018, 12:35 PM

## 2018-01-04 ENCOUNTER — Inpatient Hospital Stay (HOSPITAL_COMMUNITY): Payer: Medicare HMO | Admitting: Occupational Therapy

## 2018-01-04 ENCOUNTER — Inpatient Hospital Stay (HOSPITAL_COMMUNITY): Payer: Medicare HMO | Admitting: Physical Therapy

## 2018-01-04 LAB — GLUCOSE, CAPILLARY
GLUCOSE-CAPILLARY: 164 mg/dL — AB (ref 70–99)
GLUCOSE-CAPILLARY: 147 mg/dL — AB (ref 70–99)
GLUCOSE-CAPILLARY: 139 mg/dL — AB (ref 70–99)
Glucose-Capillary: 124 mg/dL — ABNORMAL HIGH (ref 70–99)

## 2018-01-04 NOTE — Progress Notes (Signed)
Physical Therapy Session Note  Patient Details  Name: [name] [name] MRN: [id number] Date of Birth: 28-Sep-1934  Today's Date: 01/04/2018 PT Individual Time: 1000-1100 PT Individual Time Calculation (min): 60 min   Short Term Goals: Week 1:  PT Short Term Goal 1 (Week 1): Pt will perform bed mobility with min assist  PT Short Term Goal 2 (Week 1): Pt will ambulate 17f with min assist and LRAD  PT Short Term Goal 3 (Week 1): Pt will perform WC<>bed transfer with min assist consistently PT Short Term Goal 4 (Week 1): PT will initiate stair training   Skilled Therapeutic Interventions/Progress Updates: Pt presented in w/c agreeable to therapy. Pt requesting to use bathroom prior to leaving room. Performed stand pivot to toilet with use of wall rails, PTA providing total A for LB clothing management (+BM). Pt returned to w/c in same manner as prior requiring increased effort to come to standing with use of wall rails. Pt transported to rehab gym and performed stand pivot to mat minA and increased time to come to standing. On mat pt transferred to supine then sidelying requiring modA and tactile cues for sequencing. In sidelying pt performed hip flexion with powder board and hip ER with RLE for NMR via forced use. Pt was able to complete 2 x 10 ea AROM. Returned to supine and performed RLE SAQ and KTC with yellow physioball 2 x 10 ea. Performed LTR x 10 for mobilization and HS stretching 1 min x 2 bilaterally to improve knee extension. Pt returned to sitting modA form mat and performed stand pivot to w/c with minA. Pt encouraged in proper hand placement throughout session with RW during transfers however poor carryover noted. Participated in gait training 267fx 1 with RW with improved RLE foot clearance. Pt transported back to room at end of session and remained in w/c. K pack placed at low back and pt left with chair alarm on, call button within reach and needs met.      Therapy  Documentation Precautions:  Precautions Precautions: Fall Precaution Comments: right hemiparesis Restrictions Weight Bearing Restrictions: No General:   Vital Signs:   Pain: Pain Assessment Pain Scale: 0-10 Pain Score: 3  Pain Type: Chronic pain Pain Location: Back Pain Orientation: Lower Pain Descriptors / Indicators: Aching Pain Onset: Gradual Pain Intervention(s): Medication (See eMAR) Mobility:   Locomotion :    Trunk/Postural Assessment :    Balance:   Exercises:   Other Treatments:      Therapy/Group: Individual Therapy  Indi Willhite  Oral Hallgren, PTA  01/04/2018, 12:47 PM

## 2018-01-04 NOTE — Progress Notes (Signed)
Physical Therapy Session Note  Patient Details  Name: [name] [name] MRN: [id number] Date of Birth: Mar 27, 1934  Today's Date: 01/04/2018 PT Individual Time: 0905-0930 PT Individual Time Calculation (min): 25 min   Short Term Goals: Week 1:  PT Short Term Goal 1 (Week 1): Pt will perform bed mobility with min assist  PT Short Term Goal 2 (Week 1): Pt will ambulate 37ft with min assist and LRAD  PT Short Term Goal 3 (Week 1): Pt will perform WC<>bed transfer with min assist consistently PT Short Term Goal 4 (Week 1): PT will initiate stair training   Skilled Therapeutic Interventions/Progress Updates:   Pt in w/c and agreeable to therapy, low back pain 1/10 at rest and gets up to 5-6/10 w/ standing. Able to decrease pain in standing w/ verbal cues for LE support and tactile cues for upright posture. Worked on posture in standing at RW while reaching for clothespins, emphasizing increased thoracic extension and increasing knee extension as he stands w/ flexed knee posture bilaterally. Performed 4-5 stands in total. Returned to room and ended session in w/c, all needs in reach. Made RN aware of pt's request for pain meds.   Therapy Documentation Precautions:  Precautions Precautions: Fall Precaution Comments: right hemiparesis Restrictions Weight Bearing Restrictions: No Pain: Pain Assessment Pain Scale: 0-10 Pain Score: 3  Pain Type: Chronic pain Pain Location: Back Pain Orientation: Lower Pain Descriptors / Indicators: Aching Pain Onset: Gradual Pain Intervention(s): Medication (See eMAR)  Therapy/Group: Individual Therapy  Khallid Pasillas K Muaad Boehning 01/04/2018, 10:04 AM

## 2018-01-04 NOTE — Progress Notes (Signed)
Occupational Therapy Session Note  Patient Details  Name: [name] [name] MRN: [id number] Date of Birth: [birth year]  Today's Date: 01/04/2018 OT Individual Time: 1405-1500 OT Individual Time Calculation (min): 55 min    Short Term Goals: Week 1:  OT Short Term Goal 1 (Week 1): Pt will be able to sit to stand from commode with min A. OT Short Term Goal 2 (Week 1): Pt will be able to self cleanse with min A post toileting. OT Short Term Goal 3 (Week 1): Pt will be able to don pants over feet with max A.   OT Short Term Goal 4 (Week 1): Pt will be able to don shirt with mod A.   OT Short Term Goal 5 (Week 1): Pt will be able to use RUE to pull pants over hips with mod A.  Skilled Therapeutic Interventions/Progress Updates:    Pt worked on doffing and donning socks and shoes to start session.  He used the reacher to doff his left shoe with increased time but then with several attempts to use the reacher to un-velcro his shoe, he was unsuccessful.  Finally, with min assist he was able to cross the RLE over the left and maintain while removing the shoe and the sock.  He reported that prior to this CVA, he was able to cross his LEs over the opposite knee for donning and doffing his footwear.  Educated pt on use of the sockaide as he could not donn either sock with his leg crossed over the opposite knee.  He was able to use with increased time and supervision.  Therapist assisted with crossing LEs for donning shoes.  Discussed benefit of AE when needed, but therapist emphasized that after seeing him cross his legs over the opposite knee, it would be better to work on getting his strength and flexibility back to complete LB selfcare without AE if possible.  He was in agreement.  Second part of session focused on bilateral shoulder PROM and stretching.  Pt transferred from wheelchair to therapy mat with mod assist stand pivot using the RW for support.  Once on the therapy mat he transitioned to supine with min  assist.  Therapist completed glenohumeral mobilizations and PROM for shoulder abduction as well as internal and external rotation.  Pt severely limited with both internal and external rotation with any degree of shoulder flexion or abduction.  Noted increased tightness and decreased ability to relax secondary to pain apprehension during most PROM movements.  Finished session with transition back to sitting from the right side with mod assist.  Pt then transferred back to the wheelchair at the same level with use of the RW for support.  Increased trunk and knee flexion noted in standing with decreased weight shift to the left and increased right lean.  Pt left in wheelchair with chair alarm in place and call button and phone in reach at end of session.    Therapy Documentation Precautions:  Precautions Precautions: Fall Precaution Comments: right hemiparesis Restrictions Weight Bearing Restrictions: No  Pain: Pain Assessment Pain Scale: Faces Faces Pain Scale: Hurts little more Pain Type: Chronic pain Pain Location: Shoulder Pain Orientation: Right;Left Pain Descriptors / Indicators: Discomfort Pain Onset: With Activity Pain Intervention(s): Repositioned;Emotional support  Therapy/Group: Individual Therapy  Leory Allinson OTR/L 01/04/2018, 4:23 PM

## 2018-01-04 NOTE — Progress Notes (Signed)
Occupational Therapy Session Note  Patient Details  Name: [name] [name] MRN: [id number] Date of Birth: [birth year]  Today's Date: 01/04/2018 OT Individual Time: 0800-0900 OT Individual Time Calculation (min): 60 min    Short Term Goals: Week 1:  OT Short Term Goal 1 (Week 1): Pt will be able to sit to stand from commode with min A. OT Short Term Goal 2 (Week 1): Pt will be able to self cleanse with min A post toileting. OT Short Term Goal 3 (Week 1): Pt will be able to don pants over feet with max A.   OT Short Term Goal 4 (Week 1): Pt will be able to don shirt with mod A.   OT Short Term Goal 5 (Week 1): Pt will be able to use RUE to pull pants over hips with mod A.  Skilled Therapeutic Interventions/Progress Updates:    Pt began session with transfer from supine to sit EOB with mod assist.  He then completed stand pivot transfers to the wheelchair with use of the RW and to the toilet, both with mod assist.  Flexed trunk and bilateral knees noted with sit to stand and transfer with decreased ability to take a normal step with the RLE.  Max assist for toileting to manage clothing and toilet hygiene.  He then transitioned over to the sink for bathing and dressing.  LH sponge utilized for washing lower legs and feet as well as washing upper arms and under arms.  Mod assist for sit to stand in order for pt to wash front peri area.  Max assist for washing his buttocks.  He completed UB dressing with min assist to pull the shirt down in the back.  LB dressing with mod assist for donning button up pants and total assist for socks and shoes.  Will educated on AE for LB dressing next session and see if it helps some.  He finished session with grooming task of brushing his teeth before resting in the wheelchair at bedside for next session.  Call button and phone in reach.    Therapy Documentation Precautions:  Precautions Precautions: Fall Precaution Comments: right hemiparesis Restrictions Weight  Bearing Restrictions: No  Pain: Pain Assessment Pain Scale: 0-10 Pain Score: 3  Pain Type: Chronic pain Pain Location: Back Pain Orientation: Lower Pain Descriptors / Indicators: Aching Pain Onset: Gradual Pain Intervention(s): Medication (See eMAR) ADL: See Care Plan Section for Details  Therapy/Group: Individual Therapy  Sohana Austell OTR/L 01/04/2018, 12:08 PM

## 2018-01-04 NOTE — Progress Notes (Signed)
[facility] PHYSICAL MEDICINE & REHABILITATION PROGRESS NOTE  Subjective/Complaints: Patient seen laying in bed this morning.  He states he slept well overnight.  ROS: Denies CP, SOB, nausea, vomiting, diarrhea.  Objective: Vital Signs: Blood pressure 118/63, pulse 71, temperature 98.5 F (36.9 C), temperature source Oral, resp. rate 20, height 5\' 9"  (1.753 m), weight 106.2 kg, SpO2 93 %. No results found. No results for input(s): WBC, HGB, HCT, PLT in the last 72 hours. No results for input(s): NA, K, CL, CO2, GLUCOSE, BUN, CREATININE, CALCIUM in the last 72 hours.  Physical Exam: BP 118/63 (BP Location: Left Arm)   Pulse 71   Temp 98.5 F (36.9 C) (Oral)   Resp 20   Ht 5\' 9"  (1.753 m)   Wt 106.2 kg   SpO2 93%   BMI 34.57 kg/m  Constitutional: Well-developed.  Obese.  NAD. HENT: Normocephalic.  Atraumatic. Eyes:EOMI.  No discharge.   Cardiovascular:RRR.  No JVD. Murmurheard. Respiratory:Effort normal.  Clea . LT:JQZE. He exhibitsno distension. There isno tenderness.  Musculoskeletal: He exhibitsedema(min edema bilateral hands and feet.).  Neurological: He isalertand oriented.  Dysarthria.   Able to follow basic commands without difficulty.  Motor: RUE 4/5 prox to distal.  LUE 4+/5 proximal to distal, stable.  RLE 4/5 prox to distal.  LLE 4+/5 proximal to distal.  Pill-rolling tremor left upper extremity Skin: Skin iswarmand dry. He isnot diaphoretic.  Psychiatric: He has anormal mood and affect. Hisbehavior is normal.  Assessment/Plan: 1. Functional deficits secondary to left > right bi-cerebral infarcts which require 3+ hours per day of interdisciplinary therapy in a comprehensive inpatient rehab setting.  Physiatrist is providing close team supervision and 24 hour management of active medical problems listed below.  Physiatrist and rehab team continue to assess barriers to discharge/monitor patient progress toward functional and medical  goals  Care Tool:  Bathing    Body parts bathed by patient: Chest, Abdomen, Right upper leg, Left upper leg, Face   Body parts bathed by helper: Right lower leg, Left lower leg, Buttocks, Left arm, Right arm     Bathing assist Assist Level: Moderate Assistance - Patient 50 - 74%     Upper Body Dressing/Undressing Upper body dressing   What is the patient wearing?: Pull over shirt    Upper body assist Assist Level: Moderate Assistance - Patient 50 - 74%    Lower Body Dressing/Undressing Lower body dressing      What is the patient wearing?: Incontinence brief, Pants     Lower body assist Assist for lower body dressing: Maximal Assistance - Patient 25 - 49%     Toileting Toileting    Toileting assist Assist for toileting: Moderate Assistance - Patient 50 - 74%     Transfers Chair/bed transfer  Transfers assist     Chair/bed transfer assist level: Moderate Assistance - Patient 50 - 74%     Locomotion Ambulation   Ambulation assist      Assist level: Moderate Assistance - Patient 50 - 74% Assistive device: Walker-rolling Max distance: 53ft   Walk 10 feet activity   Assist     Assist level: Minimal Assistance - Patient > 75% Assistive device: Walker-rolling   Walk 50 feet activity   Assist Walk 50 feet with 2 turns activity did not occur: Safety/medical concerns         Walk 150 feet activity   Assist Walk 150 feet activity did not occur: Safety/medical concerns         Walk 10  feet on uneven surface  activity   Assist Walk 10 feet on uneven surfaces activity did not occur: Safety/medical concerns         Wheelchair     Assist   Type of Wheelchair: Manual    Wheelchair assist level: Moderate Assistance - Patient 50 - 74% Max wheelchair distance: 75    Wheelchair 50 feet with 2 turns activity    Assist        Assist Level: Moderate Assistance - Patient 50 - 74%   Wheelchair 150 feet activity      Assist Wheelchair 150 feet activity did not occur: Safety/medical concerns          Medical Problem List and Plan: 1.Right HP and functional deficitssecondary to left > right bi-cerebral infarcts  Continue CIR 2. DVT Prophylaxis/Anticoagulation: Pharmaceutical:Heparin 3. Pain Management:N/A 4. Mood:LCSW to follow for evaluation and support. 5. Neuropsych: This patientiscapable of making decisions on hisown behalf. 6. Skin/Wound Care:Monitor wound daily for healing. Maintain adequate nutritional and hydration status  7. Fluids/Electrolytes/Nutrition:Monitor I's and O's.  8.S/PLeft QRF:XJOI x3 weeks followed by Plavix alone. Continue statin 9.CKD: Continue to monitor with serial checks. Encourage fluid intake.  Now with AKI, creatinine 1.85 on 10/18  Encourage fluids  Labs ordered for tomorrow 10.Seizure disorder: Cont Keppra twice daily 11.T2DM: Hgb Aic- 7.2.Was on metformin and Januvia at home prior to admission [employer] blood sugars AC/HS basis. Use sliding scale insulin for tighter blood sugar control.  Resumed Januvia. Continue hold metformin due to baseline serum creatinine 1.7 Will considerAmaryl.   Labile on 10/22 12.BPH: Continue Flomax and Proscar. 13.E coli UTI: Completed treatment 10/10 D/ced condom cath 14.  Hypoalbuminemia  Supplement initiated on 10/18 15.  Morbid obesity  Encourage weight loss  LOS: 5 days A FACE TO FACE EVALUATION WAS PERFORMED  [name] [name] [place] 01/04/2018, 8:16 AM

## 2018-01-05 ENCOUNTER — Inpatient Hospital Stay (HOSPITAL_COMMUNITY): Payer: Medicare HMO | Admitting: Occupational Therapy

## 2018-01-05 ENCOUNTER — Inpatient Hospital Stay (HOSPITAL_COMMUNITY): Payer: Medicare HMO | Admitting: *Deleted

## 2018-01-05 ENCOUNTER — Inpatient Hospital Stay (HOSPITAL_COMMUNITY): Payer: Medicare HMO | Admitting: Physical Therapy

## 2018-01-05 LAB — BASIC METABOLIC PANEL
Anion gap: 9 (ref 5–15)
BUN: 30 mg/dL — ABNORMAL HIGH (ref 8–23)
CALCIUM: 8.7 mg/dL — AB (ref 8.9–10.3)
CHLORIDE: 109 mmol/L (ref 98–111)
CO2: 23 mmol/L (ref 22–32)
Creatinine, Ser: 1.56 mg/dL — ABNORMAL HIGH (ref 0.61–1.24)
GFR calc non Af Amer: 40 mL/min — ABNORMAL LOW (ref >60)
GFR, EST AFRICAN AMERICAN: 46 mL/min — AB (ref >60)
GLUCOSE: 144 mg/dL — AB (ref 70–99)
POTASSIUM: 3.8 mmol/L (ref 3.5–5.1)
Sodium: 141 mmol/L (ref 135–145)

## 2018-01-05 LAB — GLUCOSE, CAPILLARY
GLUCOSE-CAPILLARY: 181 mg/dL — AB (ref 70–99)
GLUCOSE-CAPILLARY: 102 mg/dL — AB (ref 70–99)
Glucose-Capillary: 145 mg/dL — ABNORMAL HIGH (ref 70–99)

## 2018-01-05 LAB — CBC
HEMATOCRIT: 46 % (ref 39.0–52.0)
HEMOGLOBIN: 14.8 g/dL (ref 13.0–17.0)
MCH: 31.2 pg (ref 26.0–34.0)
MCHC: 32.2 g/dL (ref 30.0–36.0)
MCV: 96.8 fL (ref 80.0–100.0)
Platelets: 189 10*3/uL (ref 150–400)
RBC: 4.75 MIL/uL (ref 4.22–5.81)
RDW: 12.7 % (ref 11.5–15.5)
WBC: 7.1 10*3/uL (ref 4.0–10.5)
nRBC: 0 % (ref 0.0–0.2)

## 2018-01-05 MED ORDER — GLIMEPIRIDE 1 MG PO TABS
1.0000 mg | ORAL_TABLET | Freq: Every day | ORAL | Status: DC
  Administered 2018-01-05 – 2018-01-18 (×14): 1 mg via ORAL
  Filled 2018-01-05 (×14): qty 1

## 2018-01-05 NOTE — Progress Notes (Addendum)
[facility] PHYSICAL MEDICINE & REHABILITATION PROGRESS NOTE  Subjective/Complaints: Patient seen laying in bed this morning.  He states he slept well overnight.  He denies complaints.  He is looking forward to hearing about his tentative discharge date.  ROS: Denies CP, SOB, nausea, vomiting, diarrhea.  Objective: Vital Signs: Blood pressure 121/87, pulse 99, temperature 98.2 F (36.8 C), temperature source Oral, resp. rate 19, height 5\' 9"  (1.753 m), weight 106.2 kg, SpO2 91 %. No results found. Recent Labs    01/05/18 0602  WBC 7.1  HGB 14.8  HCT 46.0  PLT 189   Recent Labs    01/05/18 0602  NA 141  K 3.8  CL 109  CO2 23  GLUCOSE 144*  BUN 30*  CREATININE 1.56*  CALCIUM 8.7*    Physical Exam: BP 121/87 (BP Location: Left Arm)   Pulse 99   Temp 98.2 F (36.8 C) (Oral)   Resp 19   Ht 5\' 9"  (1.753 m)   Wt 106.2 kg   SpO2 91%   BMI 34.57 kg/m  Constitutional: Well-developed.  Obese.  NAD. HENT: Normocephalic.  Atraumatic. Eyes:EOMI.  No discharge.   Cardiovascular:RRR.  No JVD. Murmurheard. Respiratory:Effort normal.  Clear. ZO:XWRU. He exhibitsno distension. There isno tenderness.  Musculoskeletal: He exhibitsno edema or tenderness. Neurological: He isalertand oriented.  Dysarthria.   Able to follow basic commands without difficulty.  Motor: RUE 4/5, except limited at shoulder.  LUE 4+/5, except limited at shoulder.  RLE 4/5 prox to distal.  LLE 4+/5 proximal to distal.  Pill-rolling tremor left upper extremity Skin: Skin iswarmand dry. He isnot diaphoretic.  Psychiatric: He has anormal mood and affect. Hisbehavior is normal.  Assessment/Plan: 1. Functional deficits secondary to left > right bi-cerebral infarcts which require 3+ hours per day of interdisciplinary therapy in a comprehensive inpatient rehab setting.  Physiatrist is providing close team supervision and 24 hour management of active medical problems listed  below.  Physiatrist and rehab team continue to assess barriers to discharge/monitor patient progress toward functional and medical goals  Care Tool:  Bathing    Body parts bathed by patient: Right arm, Left arm, Chest, Abdomen, Front perineal area, Right upper leg, Left upper leg, Right lower leg, Left lower leg, Face   Body parts bathed by helper: Buttocks     Bathing assist Assist Level: Moderate Assistance - Patient 50 - 74%     Upper Body Dressing/Undressing Upper body dressing   What is the patient wearing?: Pull over shirt    Upper body assist Assist Level: Minimal Assistance - Patient > 75%    Lower Body Dressing/Undressing Lower body dressing      What is the patient wearing?: Incontinence brief, Pants     Lower body assist Assist for lower body dressing: Maximal Assistance - Patient 25 - 49%     Toileting Toileting    Toileting assist Assist for toileting: Maximal Assistance - Patient 25 - 49%     Transfers Chair/bed transfer  Transfers assist     Chair/bed transfer assist level: Minimal Assistance - Patient > 75%     Locomotion Ambulation   Ambulation assist      Assist level: Minimal Assistance - Patient > 75% Assistive device: Walker-rolling Max distance: 38ft   Walk 10 feet activity   Assist     Assist level: Minimal Assistance - Patient > 75% Assistive device: Walker-rolling   Walk 50 feet activity   Assist Walk 50 feet with 2 turns activity did not  occur: Safety/medical concerns         Walk 150 feet activity   Assist Walk 150 feet activity did not occur: Safety/medical concerns         Walk 10 feet on uneven surface  activity   Assist Walk 10 feet on uneven surfaces activity did not occur: Safety/medical concerns         Wheelchair     Assist   Type of Wheelchair: Manual    Wheelchair assist level: Moderate Assistance - Patient 50 - 74% Max wheelchair distance: 75    Wheelchair 50 feet with 2  turns activity    Assist        Assist Level: Moderate Assistance - Patient 50 - 74%   Wheelchair 150 feet activity     Assist Wheelchair 150 feet activity did not occur: Safety/medical concerns          Medical Problem List and Plan: 1.Right HP and functional deficitssecondary to left > right bi-cerebral infarcts  Continue CIR 2. DVT Prophylaxis/Anticoagulation: Pharmaceutical:Heparin 3. Pain Management:N/A 4. Mood:LCSW to follow for evaluation and support. 5. Neuropsych: This patientiscapable of making decisions on hisown behalf. 6. Skin/Wound Care:Monitor wound daily for healing. Maintain adequate nutritional and hydration status  7. Fluids/Electrolytes/Nutrition:Monitor I's and O's.  8.S/PLeft PJK:DTOI x3 weeks followed by Plavix alone. Continue statin 9.CKD: Continue to monitor with serial checks. Encourage fluid intake.  Now with AKI, creatinine 1.56 on 10/23  Encourage fluids, will consider IVF if no improvement 10.Seizure disorder: Cont Keppra twice daily 11.T2DM: Hgb Aic- 7.2.Was on metformin and Januvia at home prior to admission Continuetomonitor blood sugars AC/HS basis. Use sliding scale insulin for tighter blood sugar control.  Resumed Januvia. Metformin on hold due to AKI  Amaryl 1 mg started on 10/23  Elevated on 10/23 12.BPH: Continue Flomax and Proscar. 13.E coli UTI: Completed treatment 10/10 D/ced condom cath 14.  Hypoalbuminemia  Supplement initiated on 10/18 15.  Morbid obesity  Encourage weight loss 16.  Labile blood pressure  Labile on 10/23  LOS: 6 days A FACE TO FACE EVALUATION WAS PERFORMED  Ankit [name] 01/05/2018, 8:07 AM

## 2018-01-05 NOTE — Progress Notes (Signed)
Occupational Therapy Session Note  Patient Details  Name: [name] [name] MRN: [id number] Date of Birth: May 17, 1934  Today's Date: 01/05/2018 OT Individual Time: 1093-2355 OT Individual Time Calculation (min): 54 min    Short Term Goals: Week 1:  OT Short Term Goal 1 (Week 1): Pt will be able to sit to stand from commode with min A. OT Short Term Goal 2 (Week 1): Pt will be able to self cleanse with min A post toileting. OT Short Term Goal 3 (Week 1): Pt will be able to don pants over feet with max A.   OT Short Term Goal 4 (Week 1): Pt will be able to don shirt with mod A.   OT Short Term Goal 5 (Week 1): Pt will be able to use RUE to pull pants over hips with mod A.  Skilled Therapeutic Interventions/Progress Updates:    Pt worked on shaving to start session with min assist for thoroughness.  Min assist for RUE use to complete 50% of shaving in sitting.  Once shaving was completed, had pt work on static standing at the sink for support as well as standing with the RW.  He demonstrates increased bilateral knee flexion as well as trunk flexion in standing with increased lean to the right.  He demonstrates resistance to weightshift to the left.  Mod assist for sit to stand and standing.  Pt then completed stand pivot transfer to the toilet with mod assist using the RW for support.  He needed max assist to complete toilet hygiene and toilet clothing management sit to stand from elevated toilet with use of the grab bar on the left side.  Completed session with transfer back to the wheelchair with call button and phone in reach.       Therapy Documentation Precautions:  Precautions Precautions: Fall Precaution Comments: right hemiparesis Restrictions Weight Bearing Restrictions: No  Vital Signs: Therapy Vitals Temp: 98.5 F (36.9 C) Temp Source: Oral Pulse Rate: 95 Resp: 16 BP: 104/80 Patient Position (if appropriate): Sitting Oxygen Therapy SpO2: 93 % O2 Device: Room  Air Pain: Pain Assessment Pain Scale: Faces Faces Pain Scale: Hurts a little bit Pain Type: Chronic pain Pain Location: Back Pain Orientation: Lower Pain Descriptors / Indicators: Discomfort Pain Intervention(s): Repositioned   Therapy/Group: Individual Therapy  Bodhi Moradi OTR/L 01/05/2018, 2:41 PM

## 2018-01-05 NOTE — Progress Notes (Signed)
Physical Therapy Session Note  Patient Details  Name: [name] [name] MRN: [id number] Date of Birth: 05/27/1934  Today's Date: 01/05/2018 PT Individual Time: 0900-1000 AND 1530-1555 PT Individual Time Calculation (min): 60 min AND 25 min   Short Term Goals: Week 1:  PT Short Term Goal 1 (Week 1): Pt will perform bed mobility with min assist  PT Short Term Goal 2 (Week 1): Pt will ambulate 28f with min assist and LRAD  PT Short Term Goal 3 (Week 1): Pt will perform WC<>bed transfer with min assist consistently PT Short Term Goal 4 (Week 1): PT will initiate stair training    Skilled Therapeutic Interventions/Progress Updates:   Pt received sitting in WC and agreeable to PT. [facility]   PT instructed pt in seated and standing NMR to improve RLE strength and coordination.  Standing: reciprocal marches x 8. Hip abduction x 6. Stepping to target x 5, lateral reach to the L with 3 sec hold x 5. Sit<>stand x 5 with min assist.  Sitting. LAQ, reciprocal marches, isometric hip abduction. Each completed x 10 BLE  Gait training with RW x 336fwith min-mod assist ovreall from PT. Multimodal cues for posture, improved L lateral weight shift and improved hip flexion to increase foot clearance on the R.   Stair management to ascend/descend 2 steps with mod assist and BUE support on rails. Max cues for step to gait pattern and upright posture to clear RLE to step up to 3 inch step.   [facility] to [facility] and [facility] reach and all needs met.      Session 2.   Pt received sitting in WC and agreeable to PT. Pt noted to be distraught and reports that he is "disgusted" with ELOS and prognosis following team conference. PT educated pt on rehab process and improved potential with increased therapy in rehab setting. .  [facility] to N[facility] with min assist from PT and RW.   Nustep reciprocal movement  training x 6 min with min assist intermittently for improved ROM on the R and improved postural control to prevent LOB to the R  Gait training with RW x 302fith min-mod assist assist. Pt noted to demonstrate mild trendelenburg gait with L hip weakness, increasing R LOB and poor foot clearance.   Patient returned to room and [facility] reach and all needs met.            Therapy Documentation Precautions:  Precautions Precautions: Fall Precaution Comments: right hemiparesis Restrictions Weight Bearing Restrictions: No Vital Signs: Therapy Vitals Temp: 98.5 F (36.9 C) Temp Source: Oral Pulse Rate: 95 Resp: 16 BP: 104/80 Patient Position (if appropriate): Sitting Oxygen Therapy SpO2: 93 % O2 Device: Room Air Pain: Pain Assessment Pain Scale: Faces Faces Pain Scale: Hurts a little bit Pain Type: Chronic pain Pain Location: Back Pain Orientation: Lower Pain Descriptors / Indicators: Discomfort Pain Intervention(s): Repositioned   Therapy/Group: Individual Therapy  AusLorie Phenix/23/2019, 3:14 PM

## 2018-01-05 NOTE — Progress Notes (Signed)
Social Work Patient ID: [name] [name], male   DOB: 1935-01-21, 83 y.o.   MRN: [id number]  Have reviewed team conference with pt who is aware of targeted d/c date of 11/5, however, "not happy about it."  Made pt aware that entire team knows that his goal is to d/c sooner than this and that we can consider change in date of meeting goals sooner.  Stressed to him that team feels it would be of great longer term benefit to stay the allotted time and be less of a safety concern in the home.  Pt agreed that I would follow up with him early next week and check progress and discuss d/c date again.  Verenise Moulin, LCSW

## 2018-01-05 NOTE — Progress Notes (Signed)
Occupational Therapy Session Note  Patient Details  Name: [name] [name] MRN: [id number] Date of Birth: Jul 21, 1934  Today's Date: 01/05/2018 OT Individual Time: 0802-0901 OT Individual Time Calculation (min): 59 min    Short Term Goals: Week 1:  OT Short Term Goal 1 (Week 1): Pt will be able to sit to stand from commode with min A. OT Short Term Goal 2 (Week 1): Pt will be able to self cleanse with min A post toileting. OT Short Term Goal 3 (Week 1): Pt will be able to don pants over feet with max A.   OT Short Term Goal 4 (Week 1): Pt will be able to don shirt with mod A.   OT Short Term Goal 5 (Week 1): Pt will be able to use RUE to pull pants over hips with mod A.  Skilled Therapeutic Interventions/Progress Updates:    Patient worked on toileting, bathing, and dressing during session.  He was able to transfer to the EOB with mod assist and complete transfers to the wheelchair, toilet, and walk-in shower with mod assist using the RW for support.  He bathed sit to stand with mod assist and use of the LH sponge for washing his under arms secondary to limited shoulder AROM.  He used the RUE spontaneously as an active assist for bathing tasks but still demonstrates weakness and decreased FM coordination, especially noted during dressing tasks.  Dressing at a min assist for pullover with min instructional cueing for hemi technique.  LB dressing mod assist overall.  Pt utilized the sockaide for donning socks, but therapist applied them to the sockaide secondary to decreased time.  He was able to cross his LE over the opposite knee for donning shoes with setup.  Pt still with increased knee, hip, and trunk flexion.  Finished session with pt up in the wheelchair for PT session with call button and phone in reach.    Therapy Documentation Precautions:  Precautions Precautions: Fall Precaution Comments: right hemiparesis Restrictions Weight Bearing Restrictions: No Pain: Pain Assessment Pain  Score: 0-No pain ADL: See Care Plan Section for details  Therapy/Group: Individual Therapy  [name] OTR/L 01/05/2018, 9:24 AM

## 2018-01-05 NOTE — Patient Care Conference (Cosign Needed)
Inpatient RehabilitationTeam Conference and Plan of Care Update Date: 01/05/2018   Time: 2:35 PM    Patient Name: [name] [name]      Medical Record Number: [id number]  Date of Birth: 04-Jul-1934 Sex: Male         Room/Bed: 4M03C/4M03C-01 Payor Info: Payor: [employer] MEDICARE / Plan: [place] HMO / Product Type: *No Product type* /    Admitting Diagnosis: CVA  Admit Date/Time:  12/30/2017  3:57 PM Admission Comments: No comment available   Primary Diagnosis:  <principal problem not specified> Principal Problem: <principal problem not specified>  Patient Active Problem List   Diagnosis Date Noted  . Hyperlipidemia LDL goal <70   . Hypoalbuminemia due to protein-calorie malnutrition ([place])   . Benign prostatic hyperplasia   . Diabetes mellitus type 2 in obese ([place])   . AKI (acute kidney injury) ([place])   . Stage 3 chronic kidney disease ([place])   . Morbid obesity ([place])   . Acute ischemic left middle cerebral artery (MCA) stroke ([place]) 12/30/2017  . Cerebral infarction ([place])   . Cerebral embolism with cerebral infarction 12/20/2017  . CVA (cerebral vascular accident) ([place]) 12/20/2017  . Acute ischemic stroke ([place])   . History of CVA (cerebrovascular accident)   . Parkinson disease ([place])   . Diastolic dysfunction   . Hypokalemia   . Right sided weakness 12/18/2017  . Chronic venous insufficiency 05/05/2017  . .0... 12/21/2016  . Weakness 12/21/2016  . Contracture of muscle of right upper arm 12/21/2016  . UTI (urinary tract infection) 12/21/2016  . Seizures ([place])   . Left knee pain 09/06/2015  . Hyperlipidemia associated with type 2 diabetes mellitus ([place]) 09/06/2015  . Tremor 09/06/2015  . Urinary frequency 09/06/2015  . Acute urinary retention 08/12/2015  . Hyperkalemia 08/10/2015  . Acute renal failure ([place]) 08/10/2015  . Acute bilateral obstructive uropathy 08/10/2015  . HTN (hypertension) 08/10/2015  . Diabetes mellitus, type 2 ([place]) 08/10/2015    Expected Discharge  Date: Expected Discharge Date: 01/18/18  Team Members Present: Physician leading conference: Dr. Delice Lesch Social Worker Present: Lennart Pall, LCSW Nurse Present: Dorien Chihuahua, RN PT Present: Barrie Folk, PT OT Present: Clyda Greener, OT SLP Present: Windell Moulding, SLP PPS Coordinator present : Daiva Nakayama, RN, CRRN     Current Status/Progress Goal Weekly Team Focus  Medical   Right HP and functional deficits secondary to left > right bi-cerebral infarcts  Improve mobility, transfers, AKI, DM, BP  See above   Bowel/Bladder   Continent of Bladder and Bowel; LBM 01/04/18  Remain continent of Bladder and Bowel  Assess and assist with toileting q 2hr and as needed   Swallow/Nutrition/ Hydration             ADL's   Mod assist for UB dressing with min assist for UB bathing, mod assist for LB bathing with AE and for LB dressing, mod assist for sit to stand and transfers stand pivot with use of the RW for support.  Uses the RUE as an active assist, Brunnstrum stage iV to V   supervision to min assist  selfcare retraining, transfer training, balance retraining, therapeutic exercise, manual therapy, neuromuscular re-education, pt/family education   Mobility   Min-mod assist for bed mobility including sit<>supine and rolling. min assist sit<>stand and stand pivot to and from [facility]. Min-mods assist ambulation with RW up to 59ft.   Supervision assist bed mobility, tansfers, and ambulation up to 138ft. min assist stair management to ascend/descend 7 steps with  1 rail   improved postural control, endurance, improved safety with mobility tasks including gait, transfers, initiation of stair management.    Communication             Safety/Cognition/ Behavioral Observations            Pain   C/o of low back pain; use Kpad and prn tylenol pain  Pain < or =3  Asess pain q shift and as needed and provide pharmacological treatments and nonpharmacological treatment as needed   Skin   MASD to groin and  abdominal folds; bilateral bruising to lower abdomen, skin tear rt elbow  Free from additonal skin breakdown  Assess, monitor, treat and educate to maintain  health skin intergrity and prevent further  skin brealkdown    Rehab Goals Patient on target to meet rehab goals: Yes *See Care Plan and progress notes for long and short-term goals.     Barriers to Discharge  Current Status/Progress Possible Resolutions Date Resolved   Physician    Medical stability     See above  Therapies, optimize DM meds, follow labs, encourage fluids      Nursing                  PT                    OT                  SLP                SW                Discharge Planning/Teaching Needs:  Pt to d/c home with "wife", [name], who is blind but can provide home management assistance and very light physical assist.  Teaching TBD   Team Discussion:  Still adjusting meds;  Encourage fluids;  Cont b/b.  Min/ mod assist overall with txs. Very limited shoulder ROM.  Can be min assist sit-stands if allowed more time.  Difficulty shifting weight on LE.  Poor standing posture.  Goals set for supervision to min assist overall.  Highly recommend that pt use ramp for home entry.  Revisions to Treatment Plan:  NA    Continued Need for Acute Rehabilitation Level of Care: The patient requires daily medical management by a physician with specialized training in physical medicine and rehabilitation for the following conditions: Daily direction of a multidisciplinary physical rehabilitation program to ensure safe treatment while eliciting the highest outcome that is of practical value to the patient.: Yes Daily medical management of patient stability for increased activity during participation in an intensive rehabilitation regime.: Yes Daily analysis of laboratory values and/or radiology reports with any subsequent need for medication adjustment of medical intervention for : Neurological problems;Diabetes problems;Blood  pressure problems;Renal problems   I attest that I was present, lead the team conference, and concur with the assessment and plan of the team.   Providence Stivers 01/05/2018, 3:37 PM

## 2018-01-06 ENCOUNTER — Inpatient Hospital Stay (HOSPITAL_COMMUNITY): Payer: Medicare HMO | Admitting: *Deleted

## 2018-01-06 ENCOUNTER — Inpatient Hospital Stay (HOSPITAL_COMMUNITY): Payer: Medicare HMO | Admitting: Occupational Therapy

## 2018-01-06 ENCOUNTER — Inpatient Hospital Stay (HOSPITAL_COMMUNITY): Payer: Medicare HMO | Admitting: Physical Therapy

## 2018-01-06 LAB — GLUCOSE, CAPILLARY
GLUCOSE-CAPILLARY: 172 mg/dL — AB (ref 70–99)
GLUCOSE-CAPILLARY: 102 mg/dL — AB (ref 70–99)
GLUCOSE-CAPILLARY: 105 mg/dL — AB (ref 70–99)
Glucose-Capillary: 157 mg/dL — ABNORMAL HIGH (ref 70–99)
Glucose-Capillary: 235 mg/dL — ABNORMAL HIGH (ref 70–99)

## 2018-01-06 NOTE — Progress Notes (Signed)
Recreational Therapy Assessment and Plan  Patient Details  Name: [name] [name] MRN: [id number] Date of Birth: 1934/10/01 Today's Date: 01/06/2018  Rehab Potential: Good ELOS:  discharge 11/5  Assessment  Problem List:      Patient Active Problem List   Diagnosis Date Noted  . Hyperlipidemia LDL goal <70   . Hypoalbuminemia due to protein-calorie malnutrition ([place])   . Benign prostatic hyperplasia   . Diabetes mellitus type 2 in obese ([place])   . AKI (acute kidney injury) ([place])   . Stage 3 chronic kidney disease ([place])   . Morbid obesity ([place])   . Acute ischemic left middle cerebral artery (MCA) stroke ([place]) 12/30/2017  . Cerebral infarction ([place])   . Cerebral embolism with cerebral infarction 12/20/2017  . CVA (cerebral vascular accident) ([place]) 12/20/2017  . Acute ischemic stroke ([place])   . History of CVA (cerebrovascular accident)   . Parkinson disease ([place])   . Diastolic dysfunction   . Hypokalemia   . Right sided weakness 12/18/2017  . Chronic venous insufficiency 05/05/2017  . .0... 12/21/2016  . Weakness 12/21/2016  . Contracture of muscle of right upper arm 12/21/2016  . UTI (urinary tract infection) 12/21/2016  . Seizures ([place])   . Left knee pain 09/06/2015  . Hyperlipidemia associated with type 2 diabetes mellitus ([place]) 09/06/2015  . Tremor 09/06/2015  . Urinary frequency 09/06/2015  . Acute urinary retention 08/12/2015  . Hyperkalemia 08/10/2015  . Acute renal failure ([place]) 08/10/2015  . Acute bilateral obstructive uropathy 08/10/2015  . HTN (hypertension) 08/10/2015  . Diabetes mellitus, type 2 ([place]) 08/10/2015    Past Medical History:      Past Medical History:  Diagnosis Date  . Arthritis   . Diabetes mellitus without complication ([place])    type 2  . Prostate disease   . Renal calculi   . Renal disorder    kidney stones  . Seizures ([place])    Past Surgical History:       Past Surgical History:  Procedure Laterality Date   . CYSTOSCOPY W/ URETERAL STENT PLACEMENT Bilateral 08/10/2015   Procedure: CYSTOSCOPY WITH BILATERAL RETROGRADE PYELOGRAM/BILATERAL URETERAL STENT PLACEMENT;  Surgeon: Kathie Rhodes, MD;  Location: [facility];  Service: Urology;  Laterality: Bilateral;  . CYSTOSCOPY WITH RETROGRADE PYELOGRAM, URETEROSCOPY AND STENT PLACEMENT Bilateral 09/02/2015   Procedure: CYSTOSCOPY BILATERAL STENT REMOVAL BILATERAL RETROGRADE PYELOGRAM, BILATERAL URETEROSCOPY AND BILATERAL STENT PLACEMENT;  Surgeon: Kathie Rhodes, MD;  Location: [facility];  Service: Urology;  Laterality: Bilateral;  . ENDARTERECTOMY Left 12/21/2017   Procedure: Left ENDARTERECTOMY CAROTID;  Surgeon: Angelia Mould, MD;  Location: [facility];  Service: Vascular;  Laterality: Left;  . HOLMIUM LASER APPLICATION Bilateral 4/53/6468   Procedure: BILATERAL HOLMIUM LASER  LITHO ;  Surgeon: Kathie Rhodes, MD;  Location: [facility];  Service: Urology;  Laterality: Bilateral;  . JOINT REPLACEMENT Right 2007   hip, Dr. Alvan [name]  . PATCH ANGIOPLASTY Left 12/21/2017   Procedure: PATCH ANGIOPLASTY;  Surgeon: Angelia Mould, MD;  Location: [facility];  Service: Vascular;  Laterality: Left;  . TONSILLECTOMY Bilateral    70 years ago    Assessment & Plan Clinical Impression: Patient is a 86 year oldRH-malewith history of seizure disorder, T2DM,LUEtremors, recurrent UTIs, questionable TIA event last year; who was admitted on 12/18/2017 with right-sided weakness, dizziness and numbness. MRI/MRA brain showed multiple small acute infarct in left MCA territory and left occipital lobe, acute lacunar infarct right centrum semiovale and moderate left P2 and possible left A1 stenosis.  MRI cervical spine showed moderate DDD most severe C5/C6with neural foraminal stenosis.2D echo showed EF of 50 to 55% with moderate concentric hypertrophy,akinesis of input septal myocardium and no SOE.Carotid Dopplers done revealing left 80 to 99% ICA stenosis. Dr. Doren [name] consulted  for input and recommended left carotid endarterectomy to reduce risk of further strokes.Dr [name] [name] consulted for input and did not feel that surgical intervention needed at this time. He underwent left CEA on 10/08/2019and to continue DAPT for 3 weeks then Plavix alone. Patient with deficits in mobility and self-care tasks.   Patient transferred to CIR on 12/30/2017.   Pt presents with decreased activity tolerance, decreased functional mobility, decreased balance, decreased coordination Limiting pt's independence with leisure/community pursuits.   Plan Min 1 TR session >20 minutes during LOS  Recommendations for other services: None   Discharge Criteria: Patient will be discharged from TR if patient refuses treatment 3 consecutive times without medical reason.  If treatment goals not met, if there is a change in medical status, if patient makes no progress towards goals or if patient is discharged from hospital.  The above assessment, treatment plan, treatment alternatives and goals were discussed and mutually agreed upon: by patient  [place] 01/06/2018, 8:36 AM

## 2018-01-06 NOTE — Progress Notes (Signed)
[facility] PHYSICAL MEDICINE & REHABILITATION PROGRESS NOTE  Subjective/Complaints: Patient seen laying in bed this morning.  He states he slept well overnight.  He still sleepy this morning.  ROS: Denies CP, SOB, nausea, vomiting, diarrhea.  Objective: Vital Signs: Blood pressure (!) 112/47, pulse 65, temperature 98.6 F (37 C), temperature source Oral, resp. rate 19, height 5\' 9"  (1.753 m), weight 106.2 kg, SpO2 93 %. No results found. Recent Labs    01/05/18 0602  WBC 7.1  HGB 14.8  HCT 46.0  PLT 189   Recent Labs    01/05/18 0602  NA 141  K 3.8  CL 109  CO2 23  GLUCOSE 144*  BUN 30*  CREATININE 1.56*  CALCIUM 8.7*    Physical Exam: BP (!) 112/47 (BP Location: Left Arm)   Pulse 65   Temp 98.6 F (37 C) (Oral)   Resp 19   Ht 5\' 9"  (1.753 m)   Wt 106.2 kg   SpO2 93%   BMI 34.57 kg/m  Constitutional: Well-developed.  Obese.  NAD. HENT: Normocephalic.  Atraumatic. Eyes:EOMI.  No discharge.   Cardiovascular:RRR.  No JVD. Respiratory:Effort normal.  Clear. NW:GNFA. He exhibitsno distension. There isno tenderness.  Musculoskeletal: He exhibitsno edema or tenderness. Neurological: He isalertand oriented.  Dysarthria.   Able to follow basic commands without difficulty.  Motor: RUE 4/5, except limited at shoulder, stable.  LUE 4+/5, except limited at shoulder, stable.  RLE 4/5 prox to distal.  LLE 4+/5 proximal to distal.  Pill-rolling tremor left upper extremity Skin: Skin iswarmand dry. He isnot diaphoretic.  Psychiatric: He has anormal mood and affect. Hisbehavior is normal.  Assessment/Plan: 1. Functional deficits secondary to left > right bi-cerebral infarcts which require 3+ hours per day of interdisciplinary therapy in a comprehensive inpatient rehab setting.  Physiatrist is providing close team supervision and 24 hour management of active medical problems listed below.  Physiatrist and rehab team continue to assess barriers to  discharge/monitor patient progress toward functional and medical goals  Care Tool:  Bathing    Body parts bathed by patient: Right arm, Left arm, Chest, Abdomen, Right upper leg, Left upper leg, Right lower leg, Left lower leg, Face   Body parts bathed by helper: Buttocks, Front perineal area     Bathing assist Assist Level: Moderate Assistance - Patient 50 - 74%     Upper Body Dressing/Undressing Upper body dressing   What is the patient wearing?: Pull over shirt    Upper body assist Assist Level: Minimal Assistance - Patient > 75%    Lower Body Dressing/Undressing Lower body dressing      What is the patient wearing?: Pants     Lower body assist Assist for lower body dressing: Maximal Assistance - Patient 25 - 49%     Toileting Toileting    Toileting assist Assist for toileting: Maximal Assistance - Patient 25 - 49%     Transfers Chair/bed transfer  Transfers assist     Chair/bed transfer assist level: Moderate Assistance - Patient 50 - 74%     Locomotion Ambulation   Ambulation assist      Assist level: Moderate Assistance - Patient 50 - 74% Assistive device: Walker-rolling Max distance: 38   Walk 10 feet activity   Assist     Assist level: Minimal Assistance - Patient > 75% Assistive device: Walker-rolling   Walk 50 feet activity   Assist Walk 50 feet with 2 turns activity did not occur: Safety/medical concerns  Assist level: Moderate  Assistance - Patient - 50 - 74%      Walk 150 feet activity   Assist Walk 150 feet activity did not occur: Safety/medical concerns         Walk 10 feet on uneven surface  activity   Assist Walk 10 feet on uneven surfaces activity did not occur: Safety/medical concerns         Wheelchair     Assist   Type of Wheelchair: Manual    Wheelchair assist level: Moderate Assistance - Patient 50 - 74% Max wheelchair distance: 75    Wheelchair 50 feet with 2 turns  activity    Assist        Assist Level: Moderate Assistance - Patient 50 - 74%   Wheelchair 150 feet activity     Assist Wheelchair 150 feet activity did not occur: Safety/medical concerns          Medical Problem List and Plan: 1.Right HP and functional deficitssecondary to left > right bi-cerebral infarcts  Continue CIR 2. DVT Prophylaxis/Anticoagulation: Pharmaceutical:Heparin 3. Pain Management:N/A 4. Mood:LCSW to follow for evaluation and support. 5. Neuropsych: This patientiscapable of making decisions on hisown behalf. 6. Skin/Wound Care:Monitor wound daily for healing. Maintain adequate nutritional and hydration status  7. Fluids/Electrolytes/Nutrition:Monitor I's and O's.  8.S/PLeft PIR:JJOA x3 weeks followed by Plavix alone. Continue statin 9.CKD: Continue to monitor with serial checks. Encourage fluid intake.  Now with AKI, creatinine 1.56 on 10/23  Encourage fluids, will consider IVF if no improvement  Labs ordered for tomorrow 10.Seizure disorder: Cont Keppra twice daily 11.T2DM: Hgb Aic- 7.2.Was on metformin and Januvia at home prior to admission Contin[employer]itor blood sugars AC/HS basis. Use sliding scale insulin for tighter blood sugar control.  Resumed Januvia. Metformin on hold due to AKI  Amaryl 1 mg started on 10/23  Labile on 10/24 12.BPH: Continue Flomax and Proscar. 13.E coli UTI: Completed treatment 10/10 D/ced condom cath 14.  Hypoalbuminemia  Supplement initiated on 10/18 15.  Morbid obesity  Encourage weight loss 16.  Labile blood pressure  Controlled on 10/24  LOS: 7 days A FACE TO FACE EVALUATION WAS PERFORMED  Toniette Devera [name] 01/06/2018, 8:27 AM

## 2018-01-06 NOTE — Progress Notes (Signed)
Physical Therapy Session Note  Patient Details  Name: [name] [name] MRN: [id number] Date of Birth: 01-Jun-1934  Today's Date: 01/06/2018 PT Individual Time:1000-1100 60 min      Short Term Goals: Week 1:  PT Short Term Goal 1 (Week 1): Pt will perform bed mobility with min assist  PT Short Term Goal 2 (Week 1): Pt will ambulate 40f with min assist and LRAD  PT Short Term Goal 3 (Week 1): Pt will perform WC<>bed transfer with min assist consistently PT Short Term Goal 4 (Week 1): PT will initiate stair training   Skilled Therapeutic Interventions/Progress Updates:   Pt received sitting in WC and agreeable to PT. WC mobility x 562fwith min-supervision assist from PT with moderate cues for improved use of and ROM on the R.   [facility] throughout treatment, min cues for improved anterior weight shift from PT. Stand pivot transfers to the R and L with min assist overall from PT.   Foot taps on 2 inch step 2 x 5 BLE with UE support on RW. Min assist on the L and mod assist on the R due to pain in the L knee. Pt reports 5/10 knee pain in the L while standing on LLE.   Sit>supine with supervision from PT with min cues technique and sequencing. Supine>sit with min assist for improve sequencing of movement in UE and LE.   Supine NMR. SAQ x10. Bridges x 5, clam shells x 10, PNF stabilizing reversals 6 sec hold 4 x 10. Cues to proper speed and ROM throughout to improve neuromotor control.   L knee Grade III joint mobs 3 x 1 min. Distraction 1 min x 3.   Gait training x 5059fith RW and min assist from PT for safety and intermittent weight shift to the L. Pt reports decreased pain in the LLE with WB following manual therapy.   Patient returned to room and left sitting in [facility] bell in reach and all needs met.          Therapy Documentation Precautions:  Precautions Precautions: Fall Precaution Comments: right hemiparesis Restrictions Weight Bearing  Restrictions: No Pain: Pain Assessment Pain Scale: Faces Pain Score: 0-No pain    Therapy/Group: Individual Therapy  AusLorie Phenix/24/2019, 10:42 AM

## 2018-01-06 NOTE — Progress Notes (Signed)
Occupational Therapy Weekly Progress Note  Patient Details  Name: [name] [name] MRN: [id number] Date of Birth: 03/19/1934  Beginning of progress report period: December 31, 2017 End of progress report period: January 06, 2018  Today's Date: 01/06/2018 OT Individual Time: 1305-1415 OT Individual Time Calculation (min): 70 min    Patient has met 3 of 5 short term goals.  Pt is making steady progress with OT at this time.   He continues to need mod assist for stand pivot transfers to the toilet and to the wheelchair but is approaching min assist level.  He is completing UB bathing with supervision using a LH sponge for washing his upper arms and underarms secondary to decreased bilateral shoulder internal rotation and horizontal adduction.  He is able to donn a pullover shirt with increased time and overall min assist to supervision inconsistently.  LB bathing is currently at a mod assist as well as dressing.  With sit to stand he is approaching min assist level, but needs mod instructional cueing to push up off of the surface he is sitting on instead of grabbing and trying to pull up on the walker.  In standing he demonstrates moderate trunk, hip, and bilateral knee flexion with increased lean to the right.  He currently demonstrates Brunnstrum stage IV-V movement in the RUE during selfcare tasks with slight increased tone in the biceps and digit flexors.  He uses it functionally for bathing tasks and dressing tasks with increased time.  Overall he is limited in BUE movement with regards to all shoulder movements of rotation, abduction, and flexion.  Therapist has been completing shoulder/scapular mobilizations as well as stretching to help increase this but progress has been limited.  He is resistant to midline orientation secondary to having a painful left knee as well as decreased spatial awareness.   He needs use of a mirror for feedback to realize his moderate lean to the right.  Overall feel like he  is progressing at a good rate.  Goals overall are set for supervision to min assist level.  Feel he can achieve these consistently with current discharge plan of 01/18/18  Recommend continued OT at CIR level to continue progression.    Patient continues to demonstrate the following deficits: muscle weakness, unbalanced muscle activation and decreased coordination, decreased midline orientation and decreased standing balance, decreased postural control, hemiplegia and decreased balance strategies and therefore will continue to benefit from skilled OT intervention to enhance overall performance with BADL.  Patient progressing toward long term goals..  Continue plan of care.  OT Short Term Goals Week 2:  OT Short Term Goal 1 (Week 2): Pt will be able to self cleanse with min A post toileting sit to stand OT Short Term Goal 2 (Week 2): Pt will be able to use RUE to pull pants over hips with mod A. OT Short Term Goal 3 (Week 2): Pt will complete all bathing sit to stand with min assist for 2 consecutive sessions.   OT Short Term Goal 4 (Week 2): Pt will perform toilet transfer with min assist ambulating from bedside to the bathroom with use of the RW for support.  Skilled Therapeutic Interventions/Progress Updates:    Pt transferred from the wheelchair to the therapy mat with mod assist stand pivot with mod assist.  Next had pt transfer to supine with mod assist in order to complete bilateral shoulder/scapular mobilizations and stretching in all planes.  Pt still with limited PROM shoulder flexion bilaterally to less   than 120 degrees bilaterally.  He demonstrates resistance to external and internal rotation PROM and stretching secondary to anticipation of pain.  Max cueing to relax, especially in the RUE secondary to some noted increased tone in the biceps and pectoral region as he resists.  Therapist performed stretching of posterior capsule with humeral mobilizations as well as pectoral release and  latissimus release. Had pt use the RUE in supine to work on reaching to cup to different directions, emphasizing shoulder horizontals abduction, shoulder flexion and elbow extension.  Had pt transition to sitting where focus was on pushing up from side sitting to sitting with use of the RUE.  Transitioned to laying down on the right side as well and working on transitioning to sitting with mod assist using the RUE to push up with.  Finished session with return to the wheelchair with mod assist and pt returning to the room.  Pt left up in the wheelchair with call button and phone in reach.    Therapy Documentation Precautions:  Precautions Precautions: Fall Precaution Comments: right hemiparesis Restrictions Weight Bearing Restrictions: No  Pain: Pain Assessment Pain Scale: Faces Faces Pain Scale: Hurts a little bit Pain Type: Chronic pain Pain Location: Shoulder Pain Orientation: Right;Left Pain Descriptors / Indicators: Discomfort Pain Onset: With Activity Pain Intervention(s): Repositioned;Emotional support  Therapy/Group: Individual Therapy  MCGUIRE,JAMES OTR/L 01/06/2018, 4:24 PM   

## 2018-01-06 NOTE — Progress Notes (Signed)
Occupational Therapy Session Note  Patient Details  Name: [name] [name] MRN: [id number] Date of Birth: 07-18-1934  Today's Date: 01/06/2018 OT Individual Time: 0902-1002 OT Individual Time Calculation (min): 60 min    Short Term Goals: Week 1:  OT Short Term Goal 1 (Week 1): Pt will be able to sit to stand from commode with min A. OT Short Term Goal 2 (Week 1): Pt will be able to self cleanse with min A post toileting. OT Short Term Goal 3 (Week 1): Pt will be able to don pants over feet with max A.   OT Short Term Goal 4 (Week 1): Pt will be able to don shirt with mod A.   OT Short Term Goal 5 (Week 1): Pt will be able to use RUE to pull pants over hips with mod A.  Skilled Therapeutic Interventions/Progress Updates:    Pt began session with supine to sit EOB with mod assist to the right side and HOB elevated 30 degrees.  He then completed functional mobility to the bathroom with mod assist using the RW for support for toileting.  Max assist for toilet hygiene with mod assist for clothing management to push his brief down in standing.  Pt still with flexed posture in standing and lean to the right.  He transferred from the elevated toilet seat to the wheelchair for bathing and dressing at the sink with mod assist, using the RW again for UE support and balance.  Supervision for UB bathing and for donning a pullover shirt.  Mod assist for donning brief and pants over his hips, but he was able to thread them both over his feet without physical assist.  He used the sockaide for donning both socks with setup but therapist applied them to the sockaide secondary to time restrictions.  Supervision for donning velcro shoes by crossing one LE over the opposite knee.  Pt finished session with completion of oral hygiene and combing his hair with setup only.  Pt left in wheelchair with call button and phone in reach.    Therapy Documentation Precautions:  Precautions Precautions: Fall Precaution  Comments: right hemiparesis Restrictions Weight Bearing Restrictions: No   Pain: Pain Assessment Pain Scale: Faces Pain Score: 0-No pain ADL: See Care Plan Section for some details  Therapy/Group: Individual Therapy  Bona Hubbard OTR/L 01/06/2018, 10:02 AM

## 2018-01-07 ENCOUNTER — Inpatient Hospital Stay (HOSPITAL_COMMUNITY): Payer: Medicare HMO | Admitting: Occupational Therapy

## 2018-01-07 ENCOUNTER — Inpatient Hospital Stay (HOSPITAL_COMMUNITY): Payer: Medicare HMO | Admitting: Physical Therapy

## 2018-01-07 DIAGNOSIS — R7309 Other abnormal glucose: Secondary | ICD-10-CM

## 2018-01-07 LAB — BASIC METABOLIC PANEL
Anion gap: 13 (ref 5–15)
BUN: 33 mg/dL — AB (ref 8–23)
CHLORIDE: 107 mmol/L (ref 98–111)
CO2: 24 mmol/L (ref 22–32)
Calcium: 9.1 mg/dL (ref 8.9–10.3)
Creatinine, Ser: 1.63 mg/dL — ABNORMAL HIGH (ref 0.61–1.24)
GFR calc Af Amer: 44 mL/min — ABNORMAL LOW (ref >60)
GFR calc non Af Amer: 38 mL/min — ABNORMAL LOW (ref >60)
Glucose, Bld: 106 mg/dL — ABNORMAL HIGH (ref 70–99)
POTASSIUM: 4 mmol/L (ref 3.5–5.1)
Sodium: 144 mmol/L (ref 135–145)

## 2018-01-07 LAB — GLUCOSE, CAPILLARY
GLUCOSE-CAPILLARY: 112 mg/dL — AB (ref 70–99)
GLUCOSE-CAPILLARY: 136 mg/dL — AB (ref 70–99)
GLUCOSE-CAPILLARY: 138 mg/dL — AB (ref 70–99)
GLUCOSE-CAPILLARY: 127 mg/dL — AB (ref 70–99)

## 2018-01-07 MED ORDER — SODIUM CHLORIDE 0.9 % IV SOLN
INTRAVENOUS | Status: AC
  Administered 2018-01-07 – 2018-01-09 (×3): via INTRAVENOUS

## 2018-01-07 NOTE — Progress Notes (Addendum)
Physical Therapy Session Note  Patient Details  Name: [name] [name] MRN: [id number] Date of Birth: 1934-09-27  Today's Date: 01/07/2018 PT Individual Time: 1105-1200 PT Individual Time Calculation (min): 55 min   Short Term Goals: Week 1:  PT Short Term Goal 1 (Week 1): Pt will perform bed mobility with min assist  PT Short Term Goal 2 (Week 1): Pt will ambulate 28f with min assist and LRAD  PT Short Term Goal 3 (Week 1): Pt will perform WC<>bed transfer with min assist consistently PT Short Term Goal 4 (Week 1): PT will initiate stair training   Skilled Therapeutic Interventions/Progress Updates:   Pt received sitting in WC and agreeable to PT. Pt reports increased pain in the low back and requests pain medication. RN administered( see MAR)  [facility] Stand pivot transfer to Nustep with mod assist the R. nustep reciprocal movement training x 5 min, level 1; with prolonged rest break following. Nustep training instructed to improved trunk/lumbar rotation and reduce back pain. Assist from PT throughout to increase knee extension on the RLE. Min assist stand pivot transfer to [place]   Gait training x 338fwith mod assist and RW. PT required to facilitate L hip stabilize on to prevent trendelenburg and improve weight shifting to the L. Sit<>stand [facility] treatment with min assist and increased time with UE on RW into standing.   Car transfer training with min assist to transfer to [name] [name] the Car and mod assist to bring BLE in into car. Pt reports that he will be using [name] [name] transfer home and plans on not leaving home for several months following D/C.   Patient returned to room and left sitting in [facility] in reach and all needs met.        Therapy Documentation Precautions:  Precautions Precautions: Fall Precaution Comments: right hemiparesis Restrictions Weight Bearing Restrictions: No    Pain: Pain Assessment Pain Scale:  Faces Faces Pain Scale: Hurts little more Pain Type: Chronic pain Pain Location: Knee Pain Orientation: Left Pain Descriptors / Indicators: Discomfort Pain Onset: With Activity Pain Intervention(s): Repositioned    Therapy/Group: Individual Therapy  AuLorie Phenix0/25/2019, 1:01 PM

## 2018-01-07 NOTE — Progress Notes (Signed)
Occupational Therapy Session Note  Patient Details  Name: [name] MRN: [id number] Date of Birth: 28-Sep-1934  Today's Date: 01/07/2018 OT Individual Time: 1015-1045 OT Individual Time Calculation (min): 30 min     Skilled Therapeutic Interventions/Progress Updates:    1:1 Therapeutic activities with focus on upright trunk posture, expanding rib cage (chest extension), shoulder/ UE abduction/ horizontal adduction/ hand supination (to be in correct anatomic position) and functional hand use. Performed sit to stands with min to mod A with focus on maintaining stance at midline. Ace wrapped left knee for more support (pt reports no knee pain in session). Progressed to reaching with right hand across body and upward to place horseshoe on basketball rim (positioned on the left to promote weight shift to the left and promote upright trunk posture. Pt required A for shoulder adduction and more distal reach. Then switched sides to retrieve horseshoes with left UE. Laid back on wedge with small towel roll along supine for trunk stretch and again opening up chest. Only could tolerate it with LEgs propped up. Pt performed transfers with mod A with extreme flexed position and extra time.  Ambulated three muskteers to promote weight shifts to the left and more upright trunk posture ~ 20 feet. Heat on back during rest periods in session.   Therapy Documentation Precautions:  Precautions Precautions: Fall Precaution Comments: right hemiparesis Restrictions Weight Bearing Restrictions: No Pain: Pain Assessment Pain Scale: Faces Faces Pain Scale: Hurts little more Pain Type: Chronic pain Pain Location: Knee Pain Orientation: Left Pain Descriptors / Indicators: Discomfort Pain Onset: With Activity Pain Intervention(s): Repositioned; heat applied and rest when needed   Therapy/Group: Individual Therapy  Willeen Cass [facility] 01/07/2018, 1:32 PM

## 2018-01-07 NOTE — Progress Notes (Signed)
Occupational Therapy Session Note  Patient Details  Name: [name] [name] MRN: [id number] Date of Birth: 05-16-1934  Today's Date: 01/07/2018 OT Individual Time: 0900-1000 OT Individual Time Calculation (min): 60 min    Short Term Goals: Week 2:  OT Short Term Goal 1 (Week 2): Pt will be able to self cleanse with min A post toileting sit to stand OT Short Term Goal 2 (Week 2): Pt will be able to use RUE to pull pants over hips with mod A. OT Short Term Goal 3 (Week 2): Pt will complete all bathing sit to stand with min assist for 2 consecutive sessions.   OT Short Term Goal 4 (Week 2): Pt will perform toilet transfer with min assist ambulating from bedside to the bathroom with use of the RW for support.  Skilled Therapeutic Interventions/Progress Updates:    Pt transitioned to the therapy gym via wheelchair and therapist assist to start session.  He completed transfer from wheelchair to mat with mod assist stand pivot with use of the RW.  Once on the mat worked on anterior pelvic tilt mobilizations with thoracic mobilizations as well.  Progressed to working on lateral weightshifts to the left while reaching with the LUE to place clothespins to promote weightshift.  RUE placed in weightbearing to encourage pushing down to help left trunk shortening while reaching.  Attempted to have pt stand and work on reaching to the left as well to place clothespins with RW for support, however pt unable to shift weight to the left in standing secondary to left knee pain.  Transitioned back to sitting where pt worked on functional reaching to pick up and place clothes pins with the RUE.  He was able to exhibit 98% accuracy with picking them up and placing them at chest level on the bedside table.  Still with decreased full elbow extension with reaching, exhibiting slight tone.  Pt returned to wheelchair with mod assist at the end of the session and back to the room.  Call button and phone in reach.   Therapy  Documentation Precautions:  Precautions Precautions: Fall Precaution Comments: right hemiparesis Restrictions Weight Bearing Restrictions: No  Pain: Pain Assessment Pain Scale: Faces Pain Score: 0-No pain Faces Pain Scale: Hurts little more Pain Type: Chronic pain Pain Location: Knee Pain Orientation: Left Pain Descriptors / Indicators: Discomfort Pain Onset: With Activity Pain Intervention(s): Repositioned  Therapy/Group: Individual Therapy  Latorria Zeoli OTR/L 01/07/2018, 10:00 AM

## 2018-01-07 NOTE — Progress Notes (Signed)
[place] PHYSICAL MEDICINE & REHABILITATION PROGRESS NOTE  Subjective/Complaints: Patient seen laying in bed this morning.  He states he slept well overnight.  He denies complaints.  ROS: Denies CP, SOB, nausea, vomiting, diarrhea.  Objective: Vital Signs: Blood pressure 133/78, pulse 87, temperature 98.1 F (36.7 C), temperature source Oral, resp. rate 18, height 5\' 9"  (1.753 m), weight 106.2 kg, SpO2 94 %. No results found. Recent Labs    01/05/18 0602  WBC 7.1  HGB 14.8  HCT 46.0  PLT 189   Recent Labs    01/05/18 0602 01/07/18 0554  NA 141 144  K 3.8 4.0  CL 109 107  CO2 23 24  GLUCOSE 144* 106*  BUN 30* 33*  CREATININE 1.56* 1.63*  CALCIUM 8.7* 9.1    Physical Exam: BP 133/78 (BP Location: Left Arm)   Pulse 87   Temp 98.1 F (36.7 C) (Oral)   Resp 18   Ht 5\' 9"  (1.753 m)   Wt 106.2 kg   SpO2 94%   BMI 34.57 kg/m  Constitutional: Well-developed.  Obese.  NAD. HENT: Normocephalic.  Atraumatic. Eyes:EOMI.  No discharge.   Cardiovascular:RRR.  No JVD. Respiratory:Effort normal.  Clear. PI:RJJO. He exhibitsno distension. There isno tenderness.  Musculoskeletal: He exhibitsno edema or tenderness. Neurological: He isalertand oriented.  Dysarthria.   Able to follow basic commands without difficulty.  Motor: RUE 4/5, except limited at shoulder, unchanged.  LUE 4+/5, except limited at shoulder, unchanged.  RLE 4/5 prox to distal.  LLE 4+/5 proximal to distal.  Pill-rolling tremor left upper extremity, unchanged. Skin: Skin iswarmand dry. He isnot diaphoretic.  Psychiatric: He has anormal mood and affect. Hisbehavior is normal.  Assessment/Plan: 1. Functional deficits secondary to left > right bi-cerebral infarcts which require 3+ hours per day of interdisciplinary therapy in a comprehensive inpatient rehab setting.  Physiatrist is providing close team supervision and 24 hour management of active medical problems listed  below.  Physiatrist and rehab team continue to assess barriers to discharge/monitor patient progress toward functional and medical goals  Care Tool:  Bathing    Body parts bathed by patient: Right arm, Left arm, Chest, Abdomen, Right upper leg, Left upper leg, Right lower leg, Left lower leg, Face, Front perineal area   Body parts bathed by helper: Buttocks     Bathing assist Assist Level: Moderate Assistance - Patient 50 - 74%     Upper Body Dressing/Undressing Upper body dressing   What is the patient wearing?: Pull over shirt    Upper body assist Assist Level: Supervision/Verbal cueing    Lower Body Dressing/Undressing Lower body dressing      What is the patient wearing?: Pants, Incontinence brief     Lower body assist Assist for lower body dressing: Moderate Assistance - Patient 50 - 74%     Toileting Toileting    Toileting assist Assist for toileting: Maximal Assistance - Patient 25 - 49%     Transfers Chair/bed transfer  Transfers assist     Chair/bed transfer assist level: Minimal Assistance - Patient > 75%     Locomotion Ambulation   Ambulation assist      Assist level: Moderate Assistance - Patient 50 - 74% Assistive device: Walker-rolling Max distance: 50   Walk 10 feet activity   Assist     Assist level: Minimal Assistance - Patient > 75% Assistive device: Walker-rolling   Walk 50 feet activity   Assist Walk 50 feet with 2 turns activity did not occur: Safety/medical concerns  Assist level: Moderate Assistance - Patient - 50 - 74% Assistive device: Walker-rolling    Walk 150 feet activity   Assist Walk 150 feet activity did not occur: Safety/medical concerns         Walk 10 feet on uneven surface  activity   Assist Walk 10 feet on uneven surfaces activity did not occur: Safety/medical concerns         Wheelchair     Assist   Type of Wheelchair: Manual    Wheelchair assist level: Supervision/Verbal  cueing Max wheelchair distance: 50    Wheelchair 50 feet with 2 turns activity    Assist        Assist Level: Supervision/Verbal cueing   Wheelchair 150 feet activity     Assist Wheelchair 150 feet activity did not occur: Safety/medical concerns          Medical Problem List and Plan: 1.Right HP and functional deficitssecondary to left > right bi-cerebral infarcts  Continue CIR 2. DVT Prophylaxis/Anticoagulation: Pharmaceutical:Heparin 3. Pain Management:N/A 4. Mood:LCSW to follow for evaluation and support. 5. Neuropsych: This patientiscapable of making decisions on hisown behalf. 6. Skin/Wound Care:Monitor wound daily for healing. Maintain adequate nutritional and hydration status  7. Fluids/Electrolytes/Nutrition:Monitor I's and O's.  8.S/PLeft SHF:WYOV x3 weeks followed by Plavix alone. Continue statin 9.CKD: Continue to monitor with serial checks. Encourage fluid intake.  Now with AKI, creatinine 1.63 on 10/25  Will order IVF x3 nights, echo reviewed-EF 50-55%  Labs ordered for Monday 10.Seizure disorder: Cont Keppra twice daily 11.T2DM: Hgb Aic- 7.2.Was on metformin and Januvia at home prior to admission Continuetomonitor blood sugars AC/HS basis. Use sliding scale insulin for tighter blood sugar control.  Resumed Januvia. Metformin on hold due to AKI  Amaryl 1 mg started on 10/23  Labile on 10/25 12.BPH: Continue Flomax and Proscar. 13.E coli UTI: Completed treatment 10/10 D/ced condom cath 14.  Hypoalbuminemia  Supplement initiated on 10/18 15.  Morbid obesity  Encourage weight loss 16.  Labile blood pressure  Controlled on 10/24  LOS: 8 days A FACE TO FACE EVALUATION WAS PERFORMED  Tima Curet [name] 01/07/2018, 8:17 AM

## 2018-01-07 NOTE — Plan of Care (Signed)
  Problem: RH SAFETY Goal: RH STG ADHERE TO SAFETY PRECAUTIONS W/ASSISTANCE/DEVICE Description STG Adhere to Safety Precautions With mod Assistance/Device.  Outcome: Progressing Goal: RH STG DECREASED RISK OF FALL WITH ASSISTANCE Description STG Decreased Risk of Fall With mod  Assistance.  Outcome: Progressing  Call within reach, bed/chair alarm, proper footwear

## 2018-01-07 NOTE — Progress Notes (Signed)
Occupational Therapy Session Note  Patient Details  Name: [name] [name] MRN: [id number] Date of Birth: [birth year]  Today's Date: 01/07/2018 OT Individual Time: 1300-1400 OT Individual Time Calculation (min): 60 min    Short Term Goals: Week 2:  OT Short Term Goal 1 (Week 2): Pt will be able to self cleanse with min A post toileting sit to stand OT Short Term Goal 2 (Week 2): Pt will be able to use RUE to pull pants over hips with mod A. OT Short Term Goal 3 (Week 2): Pt will complete all bathing sit to stand with min assist for 2 consecutive sessions.   OT Short Term Goal 4 (Week 2): Pt will perform toilet transfer with min assist ambulating from bedside to the bathroom with use of the RW for support.  Skilled Therapeutic Interventions/Progress Updates:    Pt transferred from wheelchair to therapy mat with mod assist stand pivot with use of the RW.  Once on the mat he then transitioned to supine and sidelying for work on bilateral shoulder mobilizations and stretches as well as AROM.  Therapist completed glenohumeral mobilizations bilaterally as well as scapular mobilizations.  Therapist then performed stretches to horizontal abduction, adduction, and flexion bilaterally.  Pt with increased pain with shoulder movements in all directions when arm was lifted greater than 60 degrees flexion and coupled with AAROM shoulder internal and external rotation.  Pt with severe limitations in internal rotation bilaterally as well as shoulder adduction.  Had pt work on [employer] in supine while holding hula hoop with emphasis on shoulder flexion bilaterally while maintaining right elbow extension.   Also worked on side to side horizontal abduction;adduction as well.  Finished session with transfer to sitting from supine on the left side with min assist.  Pt then transferred back to the wheelchair with mod facilitation and was taken back to the room.  He was left up in the wheelchair with Kpad on his lower back.   Call button and phone in reach at end of session.  Therapy Documentation Precautions:  Precautions Precautions: Fall Precaution Comments: right hemiparesis Restrictions Weight Bearing Restrictions: No  Pain: Pain Assessment Pain Scale: Faces Faces Pain Scale: Hurts little more Pain Type: Acute pain Pain Location: Shoulder Pain Orientation: Left;Right Pain Descriptors / Indicators: Discomfort Pain Onset: With Activity Pain Intervention(s): Repositioned ADL: Therapy/Group: Individual Therapy  Emmory Solivan OTR/L 01/07/2018, 3:31 PM

## 2018-01-08 ENCOUNTER — Inpatient Hospital Stay (HOSPITAL_COMMUNITY): Payer: Medicare HMO | Admitting: Occupational Therapy

## 2018-01-08 LAB — GLUCOSE, CAPILLARY
GLUCOSE-CAPILLARY: 123 mg/dL — AB (ref 70–99)
GLUCOSE-CAPILLARY: 117 mg/dL — AB (ref 70–99)
Glucose-Capillary: 121 mg/dL — ABNORMAL HIGH (ref 70–99)
Glucose-Capillary: 110 mg/dL — ABNORMAL HIGH (ref 70–99)

## 2018-01-08 MED ORDER — INFLUENZA VAC SPLIT HIGH-DOSE 0.5 ML IM SUSY
0.5000 mL | PREFILLED_SYRINGE | INTRAMUSCULAR | Status: AC
  Administered 2018-01-09: 0.5 mL via INTRAMUSCULAR
  Filled 2018-01-08: qty 0.5

## 2018-01-08 NOTE — Progress Notes (Signed)
Occupational Therapy Session Note  Patient Details  Name: [name] [name] MRN: [id number] Date of Birth: [birth year]  Today's Date: 01/08/2018 OT Individual Time: 1016-1101 OT Individual Time Calculation (min): 45 min    Short Term Goals: Week 2:  OT Short Term Goal 1 (Week 2): Pt will be able to self cleanse with min A post toileting sit to stand OT Short Term Goal 2 (Week 2): Pt will be able to use RUE to pull pants over hips with mod A. OT Short Term Goal 3 (Week 2): Pt will complete all bathing sit to stand with min assist for 2 consecutive sessions.   OT Short Term Goal 4 (Week 2): Pt will perform toilet transfer with min assist ambulating from bedside to the bathroom with use of the RW for support.  Skilled Therapeutic Interventions/Progress Updates:    Pt worked on grooming tasks at the sink during session.  He was able to shave using his electric razor, using the RUE for 50% of task with min instructional cueing.  He used the left for thoroughness and to shave the left side of his face.  He then worked on washing his lower legs and feet, as all other parts were completed earlier with nursing.  He needed min assist for crossing the RLE over the left knee this am, stating his lower back was a little sore.  He was able to remove his shoes and socks with this method as well as washing his feet.  He then donned his socks without use of the sockaide this session and also his velcro shoes, again with min assist for crossing the RLE only.  Finished session with work on functional reaching with the RUE to pick up foam pieces and place in cup.  Initially one at a time and then progressing to 2 at a time with in hand manipulation to work them out to his fingertips.  He demonstrated 50% drops when attempting to manipulate them from his palm to fingertip and back.  Finished session with pt in the wheelchair with call button and phone in reach.    Therapy Documentation Precautions:   Precautions Precautions: Fall Precaution Comments: right hemiparesis Restrictions Weight Bearing Restrictions: No   Pain: Pain Assessment Pain Scale: 0-10 Pain Score: 2  Pain Type: Chronic pain Pain Location: Back Pain Orientation: Lower Pain Descriptors / Indicators: Aching Pain Onset: On-going Pain Intervention(s): Repositioned ADL: See Care Plan Section for Details  Therapy/Group: Individual Therapy  Leyanna Bittman OTR/L 01/08/2018, 11:01 AM

## 2018-01-08 NOTE — Progress Notes (Signed)
[place] PHYSICAL MEDICINE & REHABILITATION PROGRESS NOTE  Subjective/Complaints: Overall feeling well. Continues to have urinary frequency, IVF makes it worse.   ROS: Patient denies fever, rash, sore throat, blurred vision, nausea, vomiting, diarrhea, cough, shortness of breath or chest pain, joint or back pain, headache, or mood change.   Objective: Vital Signs: Blood pressure 134/89, pulse 94, temperature 98.1 F (36.7 C), temperature source Oral, resp. rate 18, height 5\' 9"  (1.753 m), weight 106.2 kg, SpO2 95 %. No results found. No results for input(s): WBC, HGB, HCT, PLT in the last 72 hours. Recent Labs    01/07/18 0554  NA 144  K 4.0  CL 107  CO2 24  GLUCOSE 106*  BUN 33*  CREATININE 1.63*  CALCIUM 9.1    Physical Exam: BP 134/89 (BP Location: Right Arm)   Pulse 94   Temp 98.1 F (36.7 C) (Oral)   Resp 18   Ht 5\' 9"  (1.753 m)   Wt 106.2 kg   SpO2 95%   BMI 34.57 kg/m  Constitutional: No distress . Vital signs reviewed. HEENT: EOMI, oral membranes moist Neck: supple Cardiovascular: RRR without murmur. No JVD    Respiratory: CTA Bilaterally without wheezes or rales. Normal effort    GI: BS +, non-tender, non-distended   Musculoskeletal: He exhibitsno edema or tenderness. Neurological: He isalertand oriented.  Dysarthria.   Able to follow basic commands without difficulty.  Motor: RUE 4/5, except limited at shoulder, stable  LUE 4+/5, except limited at shoulder, stable  RLE 4/5 prox to distal.  LLE 4+/5 proximal to distal.  Pill-rolling tremor left upper extremity, unchanged. Skin: Skin iswarmand dry. He isnot diaphoretic.  Psychiatric: He has anormal mood and affect. Hisbehavior is normal.  Assessment/Plan: 1. Functional deficits secondary to left > right bi-cerebral infarcts which require 3+ hours per day of interdisciplinary therapy in a comprehensive inpatient rehab setting.  Physiatrist is providing close team supervision and 24 hour  management of active medical problems listed below.  Physiatrist and rehab team continue to assess barriers to discharge/monitor patient progress toward functional and medical goals  Care Tool:  Bathing    Body parts bathed by patient: Right arm, Left arm, Chest, Abdomen, Right upper leg, Left upper leg, Right lower leg, Left lower leg, Face, Front perineal area   Body parts bathed by helper: Buttocks     Bathing assist Assist Level: Moderate Assistance - Patient 50 - 74%     Upper Body Dressing/Undressing Upper body dressing   What is the patient wearing?: Pull over shirt    Upper body assist Assist Level: Supervision/Verbal cueing    Lower Body Dressing/Undressing Lower body dressing      What is the patient wearing?: Pants, Incontinence brief     Lower body assist Assist for lower body dressing: Moderate Assistance - Patient 50 - 74%     Toileting Toileting    Toileting assist Assist for toileting: Maximal Assistance - Patient 25 - 49%     Transfers Chair/bed transfer  Transfers assist     Chair/bed transfer assist level: Moderate Assistance - Patient 50 - 74%     Locomotion Ambulation   Ambulation assist      Assist level: Moderate Assistance - Patient 50 - 74% Assistive device: Walker-rolling Max distance: 35   Walk 10 feet activity   Assist     Assist level: Moderate Assistance - Patient - 50 - 74% Assistive device: Walker-rolling   Walk 50 feet activity   Assist Walk  50 feet with 2 turns activity did not occur: Safety/medical concerns  Assist level: Moderate Assistance - Patient - 50 - 74% Assistive device: Walker-rolling    Walk 150 feet activity   Assist Walk 150 feet activity did not occur: Safety/medical concerns         Walk 10 feet on uneven surface  activity   Assist Walk 10 feet on uneven surfaces activity did not occur: Safety/medical concerns         Wheelchair     Assist   Type of Wheelchair:  Manual    Wheelchair assist level: Supervision/Verbal cueing Max wheelchair distance: 50    Wheelchair 50 feet with 2 turns activity    Assist        Assist Level: Supervision/Verbal cueing   Wheelchair 150 feet activity     Assist Wheelchair 150 feet activity did not occur: Safety/medical concerns          Medical Problem List and Plan: 1.Right HP and functional deficitssecondary to left > right bi-cerebral infarcts  Continue CIR 2. DVT Prophylaxis/Anticoagulation: Pharmaceutical:Heparin 3. Pain Management:N/A 4. Mood:LCSW to follow for evaluation and support. 5. Neuropsych: This patientiscapable of making decisions on hisown behalf. 6. Skin/Wound Care:Monitor wound daily for healing. Maintain adequate nutritional and hydration status  7. Fluids/Electrolytes/Nutrition:Monitor I's and O's.  8.S/PLeft LZJ:QBHA x3 weeks followed by Plavix alone. Continue statin 9.CKD: Continue to monitor with serial checks. Encourage fluid intake.  Now with AKI, creatinine 1.63 on 10/25  [facility] at [facility] ordered for 3 days  Pt states that he's hesitant to drink because he's afraid to have to use the bathroom too much 10.Seizure disorder: Cont Keppra twice daily 11.T2DM: Hgb Aic- 7.2.Was on metformin and Januvia at home prior to admission Continuetomonitor blood sugars AC/HS basis. Use sliding scale insulin for tighter blood sugar control.  Resumed Januvia. Metformin on hold due to AKI  Amaryl 1 mg started on 10/23  -reasonable control 10/26 12.BPH: Continue Flomax and Proscar. 13.E coli UTI: Completed treatment 10/10 D/ced condom cath 14.  Hypoalbuminemia  Supplement initiated on 10/18 15.  Morbid obesity  Encourage weight loss 16.  Labile blood pressure  Borderline control 10/26  LOS: 9 days A FACE TO [place] 01/08/2018, 9:49 AM

## 2018-01-09 ENCOUNTER — Inpatient Hospital Stay (HOSPITAL_COMMUNITY): Payer: Medicare HMO | Admitting: Physical Therapy

## 2018-01-09 LAB — GLUCOSE, CAPILLARY
GLUCOSE-CAPILLARY: 106 mg/dL — AB (ref 70–99)
GLUCOSE-CAPILLARY: 99 mg/dL (ref 70–99)
GLUCOSE-CAPILLARY: 112 mg/dL — AB (ref 70–99)
GLUCOSE-CAPILLARY: 153 mg/dL — AB (ref 70–99)

## 2018-01-09 NOTE — Progress Notes (Signed)
[place] PHYSICAL MEDICINE & REHABILITATION PROGRESS NOTE  Subjective/Complaints: No new issues. Mild low back pain.   ROS: Patient denies fever, rash, sore throat, blurred vision, nausea, vomiting, diarrhea, cough, shortness of breath or chest pain, joint or back pain, headache, or mood change.    Objective: Vital Signs: Blood pressure (!) 151/88, pulse 84, temperature 97.8 F (36.6 C), temperature source Oral, resp. rate 20, height 5\' 9"  (1.753 m), weight 106.2 kg, SpO2 96 %. No results found. No results for input(s): WBC, HGB, HCT, PLT in the last 72 hours. Recent Labs    01/07/18 0554  NA 144  K 4.0  CL 107  CO2 24  GLUCOSE 106*  BUN 33*  CREATININE 1.63*  CALCIUM 9.1    Physical Exam: BP (!) 151/88 (BP Location: Right Arm)   Pulse 84   Temp 97.8 F (36.6 C) (Oral)   Resp 20   Ht 5\' 9"  (1.753 m)   Wt 106.2 kg   SpO2 96%   BMI 34.57 kg/m  Constitutional: No distress . Vital signs reviewed. HEENT: EOMI, oral membranes moist Neck: supple Cardiovascular: RRR without murmur. No JVD    Respiratory: CTA Bilaterally without wheezes or rales. Normal effort    GI: BS +, non-tender, non-distended  Musculoskeletal: He exhibitsno edema or tenderness. Neurological: He isalertand oriented.  Dysarthria.   Able to follow basic commands without difficulty.  Motor: RUE 4/5, except limited at shoulder, stable  LUE 4+/5, except limited at shoulder, stable  RLE 4/5 prox to distal.  LLE 4+/5 proximal to distal.  Pill-rolling tremor left upper extremity, unchanged. Skin: Skin iswarmand dry. He isnot diaphoretic.  Psychiatric: He has anormal mood and affect. Hisbehavior is normal.  Assessment/Plan: 1. Functional deficits secondary to left > right bi-cerebral infarcts which require 3+ hours per day of interdisciplinary therapy in a comprehensive inpatient rehab setting.  Physiatrist is providing close team supervision and 24 hour management of active medical problems  listed below.  Physiatrist and rehab team continue to assess barriers to discharge/monitor patient progress toward functional and medical goals  Care Tool:  Bathing    Body parts bathed by patient: Right lower leg, Left lower leg, Face, Right upper leg, Left upper leg   Body parts bathed by helper: Buttocks Body parts n/a: Right arm, Left arm, Chest, Abdomen, Front perineal area, Buttocks(Completed earlier with nursing)   Bathing assist Assist Level: Minimal Assistance - Patient > 75%     Upper Body Dressing/Undressing Upper body dressing   What is the patient wearing?: Pull over shirt    Upper body assist Assist Level: Supervision/Verbal cueing    Lower Body Dressing/Undressing Lower body dressing      What is the patient wearing?: Pants, Incontinence brief     Lower body assist Assist for lower body dressing: Moderate Assistance - Patient 50 - 74%     Toileting Toileting    Toileting assist Assist for toileting: Moderate Assistance - Patient 50 - 74%     Transfers Chair/bed transfer  Transfers assist     Chair/bed transfer assist level: Moderate Assistance - Patient 50 - 74%     Locomotion Ambulation   Ambulation assist      Assist level: Moderate Assistance - Patient 50 - 74% Assistive device: Walker-rolling Max distance: 35   Walk 10 feet activity   Assist     Assist level: Moderate Assistance - Patient - 50 - 74% Assistive device: Walker-rolling   Walk 50 feet activity   Assist  Walk 50 feet with 2 turns activity did not occur: Safety/medical concerns  Assist level: Moderate Assistance - Patient - 50 - 74% Assistive device: Walker-rolling    Walk 150 feet activity   Assist Walk 150 feet activity did not occur: Safety/medical concerns         Walk 10 feet on uneven surface  activity   Assist Walk 10 feet on uneven surfaces activity did not occur: Safety/medical concerns         Wheelchair     Assist   Type of  Wheelchair: Manual    Wheelchair assist level: Supervision/Verbal cueing Max wheelchair distance: 50    Wheelchair 50 feet with 2 turns activity    Assist        Assist Level: Supervision/Verbal cueing   Wheelchair 150 feet activity     Assist Wheelchair 150 feet activity did not occur: Safety/medical concerns          Medical Problem List and Plan: 1.Right HP and functional deficitssecondary to left > right bi-cerebral infarcts  Continue CIR 2. DVT Prophylaxis/Anticoagulation: Pharmaceutical:Heparin 3. Pain Management:N/A 4. Mood:LCSW to follow for evaluation and support. 5. Neuropsych: This patientiscapable of making decisions on hisown behalf. 6. Skin/Wound Care:Monitor wound daily for healing. Maintain adequate nutritional and hydration status  7. Fluids/Electrolytes/Nutrition:Monitor I's and O's.  8.S/PLeft VEH:MCNO x3 weeks followed by Plavix alone. Continue statin 9.CKD: Continue to monitor with serial checks.    Now with AKI, creatinine 1.63 on 10/25--labs tomorrow  IVF at HS day 3  Pt states that he's hesitant to drink because he's afraid to have to use the bathroom too much. Encouraged otherwise 10.Seizure disorder: Cont Keppra twice daily 11.T2DM: Hgb Aic- 7.2.Was on metformin and Januvia at home prior to admission Continuetomonitor blood sugars AC/HS basis. Use sliding scale insulin for tighter blood sugar control.  Resumed Januvia. Metformin on hold due to AKI  Amaryl 1 mg started on 10/23  -reasonable control 10/27 12.BPH: Continue Flomax and Proscar. 13.E coli UTI: Completed treatment 10/10 D/ced condom cath 14.  Hypoalbuminemia  Supplement initiated on 10/18 15.  Morbid obesity  Encourage weight loss 16.  Labile blood pressure  Borderline control 10/27. Consider medicating if persistent  LOS: 10 days A FACE TO FACE EVALUATION WAS PERFORMED  [name] 01/09/2018, 10:10 AM

## 2018-01-09 NOTE — Progress Notes (Signed)
Physical Therapy Session Note  Patient Details  Name: [name] [name] MRN: [id number] Date of Birth: April 14, 1934  Today's Date: 01/09/2018 PT Individual Time: 0900-1000 PT Individual Time Calculation (min): 60 min   Short Term Goals: Week 1:  PT Short Term Goal 1 (Week 1): Pt will perform bed mobility with min assist  PT Short Term Goal 2 (Week 1): Pt will ambulate 69ft with min assist and LRAD  PT Short Term Goal 3 (Week 1): Pt will perform WC<>bed transfer with min assist consistently PT Short Term Goal 4 (Week 1): PT will initiate stair training   Skilled Therapeutic Interventions/Progress Updates:  Pt was seen bedside in the am. Pt propelled w/c about 75 feet with B UEs and S with increased time. Pt performed multiple sit to stand and stand pivot transfers with min A and verbal cues with rolling walker. Pt ambulated 5 feet x 2 with rolling walker and min A with verbal cues. Pt rode Nustep 3 x 5 mins with B UE and LEs. Pt performed toilet transfers and sit to stands from toilet with grab bar and min A with verbal cues. Pt dependent for hygiene.  Pt left sitting up in w/c following treatment with call bell within reach.   Therapy Documentation Precautions:  Precautions Precautions: Fall Precaution Comments: right hemiparesis Restrictions Weight Bearing Restrictions: No General:   Vital Signs:  Pain: No c/o pain.    Therapy/Group: Individual Therapy  Dub Amis 01/09/2018, 12:15 PM

## 2018-01-10 ENCOUNTER — Inpatient Hospital Stay (HOSPITAL_COMMUNITY): Payer: Medicare HMO | Admitting: Physical Therapy

## 2018-01-10 ENCOUNTER — Inpatient Hospital Stay (HOSPITAL_COMMUNITY): Payer: Medicare HMO | Admitting: Occupational Therapy

## 2018-01-10 DIAGNOSIS — R0989 Other specified symptoms and signs involving the circulatory and respiratory systems: Secondary | ICD-10-CM

## 2018-01-10 LAB — BASIC METABOLIC PANEL
Anion gap: 10 (ref 5–15)
BUN: 26 mg/dL — ABNORMAL HIGH (ref 8–23)
CALCIUM: 8.9 mg/dL (ref 8.9–10.3)
CHLORIDE: 109 mmol/L (ref 98–111)
CO2: 23 mmol/L (ref 22–32)
CREATININE: 1.59 mg/dL — AB (ref 0.61–1.24)
GFR calc non Af Amer: 39 mL/min — ABNORMAL LOW (ref >60)
GFR, EST AFRICAN AMERICAN: 45 mL/min — AB (ref >60)
GLUCOSE: 131 mg/dL — AB (ref 70–99)
Potassium: 4.9 mmol/L (ref 3.5–5.1)
Sodium: 142 mmol/L (ref 135–145)

## 2018-01-10 LAB — GLUCOSE, CAPILLARY
GLUCOSE-CAPILLARY: 112 mg/dL — AB (ref 70–99)
GLUCOSE-CAPILLARY: 105 mg/dL — AB (ref 70–99)
Glucose-Capillary: 112 mg/dL — ABNORMAL HIGH (ref 70–99)
Glucose-Capillary: 90 mg/dL (ref 70–99)

## 2018-01-10 NOTE — Progress Notes (Signed)
Physical Therapy Weekly Progress Note  Patient Details  Name: [name] [name] MRN: [id number] Date of Birth: [birth year]  Beginning of progress report period: December 31, 2017 End of progress report period: January 10, 2018  Today's Date: 01/10/2018 PT Individual Time: 2119-4174 PT Individual Time Calculation (min): 71 min   Patient has met 3 of 4 short term goals.  Pt has demonstrated slow but steady gains with therapy. He continues to require cues for R foot clearance with ambulation and safety with transfers.   Patient continues to demonstrate the following deficits muscle weakness and impaired timing and sequencing, unbalanced muscle activation, decreased coordination and decreased motor planning and therefore will continue to benefit from skilled PT intervention to increase functional independence with mobility.  Patient progressing toward long term goals..  Continue plan of care.  PT Short Term Goals Week 1:  PT Short Term Goal 1 (Week 1): Pt will perform bed mobility with min assist  PT Short Term Goal 1 - Progress (Week 1): Progressing toward goal PT Short Term Goal 2 (Week 1): Pt will ambulate 47f with min assist and LRAD  PT Short Term Goal 2 - Progress (Week 1): Met PT Short Term Goal 3 (Week 1): Pt will perform WC<>bed transfer with min assist consistently PT Short Term Goal 3 - Progress (Week 1): Met PT Short Term Goal 4 (Week 1): PT will initiate stair training  PT Short Term Goal 4 - Progress (Week 1): Partly met Week 2:    STG=LTG due to ELOS   Skilled Therapeutic Interventions/Progress Updates: Pt presented in w/c agreeable to therapy. Pt transported to rehab gym for energy conservation. Participated in gait training with RW and chair follow with minA x 540f Pt required verbal cues for improving erect posture, staying close to RW, and improving R foot clearance. Pt participated in toe taps to 2in step in parallel bars. Pt was able to elevate RLE fully to clear step. Pt  performed 2 x 5 RLE and x 10 alternating BLE. Pt participated in standing tolerance activities by participating in peg board. Pt noted to tolerate increased time with peg board this session. Participated in w/c propulsion x 50 ft with multimodal cues for increasing retraction for improved push through. Pt was able to perform however did not maintain carryover. Transported remaining distance to day room and performed stand pivot to NuStep CGA. Participated in NuStep L1 x 10 min for endurance and global strengthening. Pt required x 1 rest break due to fatigue. Pt returned to w/c in same manner as prior and transported back to room. Pt remained in w/c and left with call bell within reach, chair alarm on, and needs met.      Therapy Documentation Precautions:  Precautions Precautions: Fall Precaution Comments: right hemiparesis Restrictions Weight Bearing Restrictions: No     Therapy/Group: Individual Therapy  Rosita DeChalus  Rosita DeChalus, PTA  01/10/2018, 4:30 PM

## 2018-01-10 NOTE — Progress Notes (Signed)
[place] PHYSICAL MEDICINE & REHABILITATION PROGRESS NOTE  Subjective/Complaints: Patient seen laying in bed this AM.  He states he slept well overnight and had a good weekend.   ROS: Denies CP, SOB, N/V/D  Objective: Vital Signs: Blood pressure 138/70, pulse 86, temperature 98.5 F (36.9 C), temperature source Oral, resp. rate 16, height 5\' 9"  (1.753 m), weight 106.2 kg, SpO2 93 %. No results found. No results for input(s): WBC, HGB, HCT, PLT in the last 72 hours. No results for input(s): NA, K, CL, CO2, GLUCOSE, BUN, CREATININE, CALCIUM in the last 72 hours.  Physical Exam: BP 138/70 (BP Location: Left Arm)   Pulse 86   Temp 98.5 F (36.9 C) (Oral)   Resp 16   Ht 5\' 9"  (1.753 m)   Wt 106.2 kg   SpO2 93%   BMI 34.57 kg/m  Constitutional: No distress . Vital signs reviewed. HENT: Normocephalic.  Atraumatic. Eyes: EOMI. No discharge. Cardiovascular: RRR. No JVD. Respiratory: CTA Bilaterally. Normal effort. GI: BS +. Non-distended. Musc: No edema or tenderness in extremities. Neurological: He isalertand oriented.  Dysarthria.   Able to follow basic commands without difficulty.  Motor: RUE 4/5, except limited at shoulder, unchanged LUE 4+/5, except limited at shoulder, unchanged RLE 4/5 prox to distal.  LLE 4+/5 proximal to distal.  Pill-rolling tremor left upper extremity, unchanged. Skin: Skin iswarmand dry. He isnot diaphoretic.  Psychiatric: He has anormal mood and affect. Hisbehavior is normal.  Assessment/Plan: 1. Functional deficits secondary to left > right bi-cerebral infarcts which require 3+ hours per day of interdisciplinary therapy in a comprehensive inpatient rehab setting.  Physiatrist is providing close team supervision and 24 hour management of active medical problems listed below.  Physiatrist and rehab team continue to assess barriers to discharge/monitor patient progress toward functional and medical goals  Care Tool:  Bathing    Body  parts bathed by patient: Right lower leg, Left lower leg, Face, Right upper leg, Left upper leg   Body parts bathed by helper: Buttocks Body parts n/a: Right arm, Left arm, Chest, Abdomen, Front perineal area, Buttocks(Completed earlier with nursing)   Bathing assist Assist Level: Minimal Assistance - Patient > 75%     Upper Body Dressing/Undressing Upper body dressing   What is the patient wearing?: Pull over shirt    Upper body assist Assist Level: Supervision/Verbal cueing    Lower Body Dressing/Undressing Lower body dressing      What is the patient wearing?: Pants, Incontinence brief     Lower body assist Assist for lower body dressing: Moderate Assistance - Patient 50 - 74%     Toileting Toileting    Toileting assist Assist for toileting: Moderate Assistance - Patient 50 - 74%     Transfers Chair/bed transfer  Transfers assist     Chair/bed transfer assist level: Moderate Assistance - Patient 50 - 74%     Locomotion Ambulation   Ambulation assist      Assist level: Minimal Assistance - Patient > 75% Assistive device: Walker-rolling Max distance: 5   Walk 10 feet activity   Assist     Assist level: Moderate Assistance - Patient - 50 - 74% Assistive device: Walker-rolling   Walk 50 feet activity   Assist Walk 50 feet with 2 turns activity did not occur: Safety/medical concerns  Assist level: Moderate Assistance - Patient - 50 - 74% Assistive device: Walker-rolling    Walk 150 feet activity   Assist Walk 150 feet activity did not occur: Safety/medical concerns  Walk 10 feet on uneven surface  activity   Assist Walk 10 feet on uneven surfaces activity did not occur: Safety/medical concerns         Wheelchair     Assist   Type of Wheelchair: Manual    Wheelchair assist level: Supervision/Verbal cueing Max wheelchair distance: 75    Wheelchair 50 feet with 2 turns activity    Assist        Assist  Level: Supervision/Verbal cueing   Wheelchair 150 feet activity     Assist Wheelchair 150 feet activity did not occur: Safety/medical concerns          Medical Problem List and Plan: 1.Right HP and functional deficitssecondary to left > right bi-cerebral infarcts  Continue CIR 2. DVT Prophylaxis/Anticoagulation: Pharmaceutical:Heparin 3. Pain Management:N/A 4. Mood:LCSW to follow for evaluation and support. 5. Neuropsych: This patientiscapable of making decisions on hisown behalf. 6. Skin/Wound Care:Monitor wound daily for healing. Maintain adequate nutritional and hydration status  7. Fluids/Electrolytes/Nutrition:Monitor I's and O's.  8.S/PLeft IPJ:ASNK x3 weeks followed by Plavix alone. Continue statin 9.CKD: Continue to monitor with serial checks.    Now with AKI, Creatinine 1.63 on 10/25  Labs pending  IVF x3 days completed on 10/28 10.Seizure disorder: Cont Keppra twice daily 11.T2DM: Hgb Aic- 7.2.Was on metformin and Januvia at home prior to admission Continuetomonitor blood sugars AC/HS basis. Use sliding scale insulin for tighter blood sugar control.  Resumed Januvia. Metformin on hold due to AKI  Amaryl 1 mg started on 10/23  Labile on 10/28 12.BPH: Continue Flomax and Proscar. 13.E coli UTI: Completed treatment 10/10 D/ced condom cath 14.  Hypoalbuminemia  Supplement initiated on 10/18 15.  Morbid obesity  Encourage weight loss 16.  Labile blood pressure  Labile on 10/28  LOS: 11 days A FACE TO FACE EVALUATION WAS PERFORMED  Janely Gullickson [name] 01/10/2018, 8:21 AM

## 2018-01-10 NOTE — Progress Notes (Signed)
Physical Therapy Session Note  Patient Details  Name: [name] [name] MRN: [id number] Date of Birth: 1935-03-02  Today's Date: 01/10/2018 PT Individual Time: 1450-1550 PT Individual Time Calculation (min): 60 min   Short Term Goals: Week 1:  PT Short Term Goal 1 (Week 1): Pt will perform bed mobility with min assist  PT Short Term Goal 1 - Progress (Week 1): Progressing toward goal PT Short Term Goal 2 (Week 1): Pt will ambulate 71f with min assist and LRAD  PT Short Term Goal 2 - Progress (Week 1): Met PT Short Term Goal 3 (Week 1): Pt will perform WC<>bed transfer with min assist consistently PT Short Term Goal 3 - Progress (Week 1): Met PT Short Term Goal 4 (Week 1): PT will initiate stair training  PT Short Term Goal 4 - Progress (Week 1): Partly met  Skilled Therapeutic Interventions/Progress Updates: Pt presented in w/c agreeable to therapy, pt stating 5/10 pain in L knee. Pt discussed with pt home set up and rationale to performing ambulation and strengthening. Pt indicated urgency for bathroom, pt ambulated from bathroom door to toilet with minA and poor RLE clearance. Performed turn with modA and toilet transfers with min to descent and mod to ascend (+void). PTA provided mod/maxA  for LB clothing management for both donning and doffing. Pt performed squat pivot to w/c with use of wall rails as pt was unable to button shorts in standing.Pt transported to rehab gym for energy conservation. PTA spent significant time discussing with pt safety concerns using rollator vs RW. Pt indicating that he is more comfortable using rollator and is what he plans to use once home. Discussed increased stability in regards to current deficits currently. Then had pt ambulate approx 141fwith rollator, noting that each time pt attempts to advance RLE (with poor clearance) rollator shifts to L. Also had pt perform standing march with rollator to demonstrate same shifting. Had pt perform standing march with RW  to demonstrate improved stability. Pt verbalized understanding after demonstration. Pt ambulated 1528fith RW with minA and consistent foot clearance however pt stating increased fatigue. Pt returned to w/c and transported back to room. Pt remained in w/c with chair alarm on, call bell within reach and needs met.      Therapy Documentation Precautions:  Precautions Precautions: Fall Precaution Comments: right hemiparesis Restrictions Weight Bearing Restrictions: No General:   Vital Signs: Therapy Vitals Temp: 98.1 F (36.7 C) Pulse Rate: 93 Resp: 19 BP: (!) 158/84 Patient Position (if appropriate): Sitting Oxygen Therapy SpO2: 98 % O2 Device: Room Air   Therapy/Group: Individual Therapy  Toma Erichsen  Jeremy Mclamb, PTA  01/10/2018, 4:16 PM

## 2018-01-10 NOTE — Progress Notes (Signed)
Occupational Therapy Session Note  Patient Details  Name: [name] [name] MRN: [id number] Date of Birth: 18-Dec-1934  Today's Date: 01/10/2018 OT Individual Time:  1300-1400 (60 mins)       Short Term Goals: Week 2:  OT Short Term Goal 1 (Week 2): Pt will be able to self cleanse with min A post toileting sit to stand OT Short Term Goal 2 (Week 2): Pt will be able to use RUE to pull pants over hips with mod A. OT Short Term Goal 3 (Week 2): Pt will complete all bathing sit to stand with min assist for 2 consecutive sessions.   OT Short Term Goal 4 (Week 2): Pt will perform toilet transfer with min assist ambulating from bedside to the bathroom with use of the RW for support.  Skilled Therapeutic Interventions/Progress Updates:    Pt sitting in w/c upon entry with reports of pain in low back however agreeable to begin therapy session with reports pain meds not due. Pt transferred to therapy gym total A. Pt performed stand pivot transfers this session with RW and min A for balance.   Pt transferred EOM <> supine with min A for controlled decent of upper body onto mat. In supine, pt participated in various bimanual UE tasks with focus on R UE NMR for shoulder flexion/extension and shoulder internal/external rotation for 3 sets x10 reps with min VC's req throughout for breath support strategies due to pt holding breath and min A at times for facilitation of full R UE elbow extension. Pt then transferred to sidelying on L side and performed 2 sets of AROM gravity eliminated shoulder flexion/ extension ~100 degrees.  Sitting EOM pt participated in tasks with focus on anterior weight shift beginning with hands on physio ball to facilitate increased anterior weight shift each rep and to alleviate lower back pain with CGA req throughout. Pt ultimately able to reach top of toes by end of session, retrieve dowel rod and bring 2# rod to shoulder flexion ~80 degrees. Pt transferred back to room as mentioned  above and left seated in w/c with chair alarm activated and all needs in reach.   Therapy Documentation Precautions:  Precautions Precautions: Fall Precaution Comments: right hemiparesis Restrictions Weight Bearing Restrictions: No   Therapy/Group: Individual Therapy  Cecily Lawhorne 01/10/2018, 3:15 PM

## 2018-01-11 ENCOUNTER — Inpatient Hospital Stay (HOSPITAL_COMMUNITY): Payer: Medicare HMO | Admitting: Physical Therapy

## 2018-01-11 ENCOUNTER — Inpatient Hospital Stay (HOSPITAL_COMMUNITY): Payer: Medicare HMO | Admitting: Occupational Therapy

## 2018-01-11 LAB — GLUCOSE, CAPILLARY
GLUCOSE-CAPILLARY: 101 mg/dL — AB (ref 70–99)
Glucose-Capillary: 122 mg/dL — ABNORMAL HIGH (ref 70–99)
Glucose-Capillary: 91 mg/dL (ref 70–99)
Glucose-Capillary: 106 mg/dL — ABNORMAL HIGH (ref 70–99)

## 2018-01-11 MED ORDER — POLYETHYLENE GLYCOL 3350 17 G PO PACK
17.0000 g | PACK | Freq: Once | ORAL | Status: AC
  Administered 2018-01-14: 17 g via ORAL
  Filled 2018-01-11 (×2): qty 1

## 2018-01-11 NOTE — Progress Notes (Signed)
Occupational Therapy Session Note  Patient Details  Name: [name] [name] MRN: [id number] Date of Birth: 04/18/1934  Today's Date: 01/11/2018 OT Individual Time: 0800-0900 OT Individual Time Calculation (min): 60 min    Short Term Goals: Week 2:  OT Short Term Goal 1 (Week 2): Pt will be able to self cleanse with min A post toileting sit to stand OT Short Term Goal 2 (Week 2): Pt will be able to use RUE to pull pants over hips with mod A. OT Short Term Goal 3 (Week 2): Pt will complete all bathing sit to stand with min assist for 2 consecutive sessions.   OT Short Term Goal 4 (Week 2): Pt will perform toilet transfer with min assist ambulating from bedside to the bathroom with use of the RW for support.  Skilled Therapeutic Interventions/Progress Updates:    Pt completed transfer from supine to sit EOB on the right side with min assist to start session.  He then ambulated to the bathroom toilet with min assist using the RW.  Decreased step length on the right side with increased trunk flexion and decreased weight shift to the left.  Min assist for managing clothing but pt did not successfully have BM this attempt.  Had him ambulate out to the wheelchair with min assist and mod instructional cueing to stay closer to the walker.  He then completed bathing and dressing sit to stand at the sink.  He utilized the [facility] sponge for washing his upper arm and underarms.  Min assist for crossing the RLE over the left knee to remove shoes and socks and to wash the feet.  Mod assist for donning pants, socks, and shoes this session.  Pt with reports of increased left knee pain greater this am than previous sessions.  Finished with pt completing oral hygiene in sitting and then moving wheelchair next to the bed for call bell and phone access.    Therapy Documentation Precautions:  Precautions Precautions: Fall Precaution Comments: right hemiparesis Restrictions Weight Bearing Restrictions: No  Pain: Pain  Assessment Pain Scale: Faces Faces Pain Scale: Hurts little more Pain Type: Chronic pain Pain Location: Knee Pain Orientation: Left Pain Descriptors / Indicators: Aching Pain Onset: With Activity Pain Intervention(s): Emotional support;Repositioned ADL: See Care Plan for Details  Therapy/Group: Individual Therapy  Bryla Burek OTR/L 01/11/2018, 9:17 AM

## 2018-01-11 NOTE — Plan of Care (Signed)
  Problem: Consults Goal: RH STROKE PATIENT EDUCATION Description See Patient Education module for education specifics  Outcome: Progressing   Problem: Consults Goal: RH STROKE PATIENT EDUCATION Description See Patient Education module for education specifics  Outcome: Progressing   Problem: RH BOWEL ELIMINATION Goal: RH STG MANAGE BOWEL WITH ASSISTANCE Description STG Manage Bowel with mod Assistance.  Outcome: Progressing Goal: RH STG MANAGE BOWEL W/MEDICATION W/ASSISTANCE Description STG Manage Bowel with Medication with mod Assistance.  Outcome: Progressing   Problem: RH BLADDER ELIMINATION Goal: RH STG MANAGE BLADDER WITH ASSISTANCE Description STG Manage Bladder With mod Assistance  Outcome: Progressing   Problem: RH SKIN INTEGRITY Goal: RH STG SKIN FREE OF INFECTION/BREAKDOWN Description No new breakdown with min assist  Outcome: Progressing Goal: RH STG MAINTAIN SKIN INTEGRITY WITH ASSISTANCE Description STG Maintain Skin Integrity With mod Assistance.  Outcome: Progressing Goal: RH STG ABLE TO PERFORM INCISION/WOUND CARE W/ASSISTANCE Description STG Able To Perform Incision/Wound Care With mod Assistance.  Outcome: Progressing   Problem: RH SAFETY Goal: RH STG ADHERE TO SAFETY PRECAUTIONS W/ASSISTANCE/DEVICE Description STG Adhere to Safety Precautions With mod Assistance/Device.  Outcome: Progressing Goal: RH STG DECREASED RISK OF FALL WITH ASSISTANCE Description STG Decreased Risk of Fall With mod  Assistance.  Outcome: Progressing   Problem: RH COGNITION-NURSING Goal: RH STG USES MEMORY AIDS/STRATEGIES W/ASSIST TO PROBLEM SOLVE Description STG Uses Memory Aids/Strategies With mod Assistance to Problem Solve.  Outcome: Progressing Goal: RH STG ANTICIPATES NEEDS/CALLS FOR ASSIST W/ASSIST/CUES Description STG Anticipates Needs/Calls for Assist With mod Assistance/Cues.  Outcome: Progressing   Problem: RH PAIN MANAGEMENT Goal: RH STG PAIN MANAGED  AT OR BELOW PT'S PAIN GOAL Description Less than 2  Outcome: Progressing   Problem: RH KNOWLEDGE DEFICIT Goal: RH STG INCREASE KNOWLEDGE OF DIABETES Description Patient will be able to verbalize care of diabetes including medications, blood sugar checks, diet and target CBG with handouts and cues  Outcome: Progressing Goal: RH STG INCREASE KNOWLEDGE OF HYPERTENSION Description Patient will be able to verbalize care of htn with cues and handouts.  Outcome: Progressing Goal: RH STG INCREASE KNOWLEDGE OF STROKE PROPHYLAXIS Description Patient will be able to verbalize stroke prophylaxis measures with cues/handouts  Outcome: Progressing

## 2018-01-11 NOTE — Progress Notes (Signed)
Occupational Therapy Session Note  Patient Details  Name: [name] [name] MRN: [id number] Date of Birth: [birth year]  Today's Date: 01/11/2018 OT Individual Time: 1100-1130 OT Individual Time Calculation (min): 30 min    Short Term Goals: Week 2:  OT Short Term Goal 1 (Week 2): Pt will be able to self cleanse with min A post toileting sit to stand OT Short Term Goal 2 (Week 2): Pt will be able to use RUE to pull pants over hips with mod A. OT Short Term Goal 3 (Week 2): Pt will complete all bathing sit to stand with min assist for 2 consecutive sessions.   OT Short Term Goal 4 (Week 2): Pt will perform toilet transfer with min assist ambulating from bedside to the bathroom with use of the RW for support.  Skilled Therapeutic Interventions/Progress Updates:    Pt completed wheelchair mobility from his room to the nurses station entrance with occasional min assist and increased time.  Emphasis on RUE neuromuscular re-education.  Took pt the rest of the way to the dayroom for further neuro re-ed.  Positioned pt at the high/low table in sitting and had him work on RUE functional use to complete PVC pipe puzzle.  Min instructional cueing to use the RUE predominantly and to not use the LUE as much.  He was able to complete two puzzles with overall supervision and increased time, including taking them apart.  Once completed therapist and pt stopped by the pt laundry to pick up his clothing from the dryer. Once in his room therapist placed clothing up on bed and had pt work on Tax adviser from wheelchair level to complete session.  Pt left with call button and phone in reach and instructions to continue working on folding clothing.    Therapy Documentation Precautions:  Precautions Precautions: Fall Precaution Comments: right hemiparesis Restrictions Weight Bearing Restrictions: No Pain: Pain Assessment Pain Scale: 0-10 Pain Score: 0-No pain Faces Pain Scale: No hurt  Therapy/Group:  Individual Therapy  Conley Delisle OTR/L 01/11/2018, 1:01 PM

## 2018-01-11 NOTE — Progress Notes (Signed)
Physical Therapy Session Note  Patient Details  Name: [name] [name] MRN: [id number] Date of Birth: December 14, 1934  Today's Date: 01/11/2018 PT Individual Time: 0930-1030 PT Individual Time Calculation (min): 60 min   Short Term Goals: Week 2:     Skilled Therapeutic Interventions/Progress Updates: Pt presented in w/c agreeable to therapy c/o 5/10 L knee pain requests no intervention. Pt indicating that has laundry in washer, transported pt to laundry room currently still on wash setting. Pt then transported to rehab gym. Performed ambulatory transfer with RW to mat minA due to poor RLE clearance. Performed grade I/II a-p mobilizations to L knee followed by hamstring stretch 1 min x 2. Pt then indicating urgency for bathroom. Performed ambulatory transfer to w/c and pt transported to room. Performed stand pivot with wall rails to toilet CGA and maxA for clothing management (+BM). Pt returned to w/c in same manner as prior. Pt returned to rehab gym and performed step ups 2 x 5 with RLE for hip flexor strengthening and R NMR. Performed ambulatory transfer to w/c with cues for increased R foot clearance. Pt then transported to laundry room and performed STS with RW and standing balance while reaching for laundry in washing machine. Pt then transported back to room and remained in w/c at end of session with call bell within reach and needs met.      Therapy Documentation Precautions:  Precautions Precautions: Fall Precaution Comments: right hemiparesis Restrictions Weight Bearing Restrictions: No General:   Vital Signs: Therapy Vitals Pulse Rate: 79 BP: (!) 145/93 Patient Position (if appropriate): Sitting Oxygen Therapy SpO2: 96 % O2 Device: Room Air    Therapy/Group: Individual Therapy  Miquan Tandon  Jara Feider, PTA  01/11/2018, 4:30 PM

## 2018-01-11 NOTE — Progress Notes (Signed)
[place] PHYSICAL MEDICINE & REHABILITATION PROGRESS NOTE  Subjective/Complaints: Patient seen sitting up in bed eating breakfast this morning.  He states he slept well overnight.  When asked, he states he is trying to drink more water.  ROS: Denies CP, SOB, N/V/D  Objective: Vital Signs: Blood pressure (!) 116/59, pulse 65, temperature (!) 97.5 F (36.4 C), resp. rate 18, height 5\' 9"  (1.753 m), weight 106.2 kg, SpO2 97 %. No results found. No results for input(s): WBC, HGB, HCT, PLT in the last 72 hours. Recent Labs    01/10/18 1428  NA 142  K 4.9  CL 109  CO2 23  GLUCOSE 131*  BUN 26*  CREATININE 1.59*  CALCIUM 8.9    Physical Exam: BP (!) 116/59 (BP Location: Left Arm)   Pulse 65   Temp (!) 97.5 F (36.4 C)   Resp 18   Ht 5\' 9"  (1.753 m)   Wt 106.2 kg   SpO2 97%   BMI 34.57 kg/m  Constitutional: No distress . Vital signs reviewed. HENT: Normocephalic.  Atraumatic. Eyes: EOMI. No discharge. Cardiovascular: RRR.  No JVD. Respiratory: CTA bilaterally. Normal effort. GI: BS +. Non-distended. Musc: No edema or tenderness in extremities. Neurological: He isalertand oriented.  Dysarthria.   Able to follow basic commands without difficulty.  Motor: RUE 4-4+/5, except limited at shoulder  LUE 4+/5, except limited at shoulder, stable RLE 4-4+/5 prox to distal.  LLE 4+/5 proximal to distal.  Pill-rolling tremor left upper extremity, stable. Skin: Skin iswarmand dry. He isnot diaphoretic.  Psychiatric: He has anormal mood and affect. Hisbehavior is normal.  Assessment/Plan: 1. Functional deficits secondary to left > right bi-cerebral infarcts which require 3+ hours per day of interdisciplinary therapy in a comprehensive inpatient rehab setting.  Physiatrist is providing close team supervision and 24 hour management of active medical problems listed below.  Physiatrist and rehab team continue to assess barriers to discharge/monitor patient progress toward  functional and medical goals  Care Tool:  Bathing    Body parts bathed by patient: Right lower leg, Left lower leg, Face, Right upper leg, Left upper leg   Body parts bathed by helper: Buttocks Body parts n/a: Right arm, Left arm, Chest, Abdomen, Front perineal area, Buttocks(Completed earlier with nursing)   Bathing assist Assist Level: Minimal Assistance - Patient > 75%     Upper Body Dressing/Undressing Upper body dressing   What is the patient wearing?: Pull over shirt    Upper body assist Assist Level: Supervision/Verbal cueing    Lower Body Dressing/Undressing Lower body dressing      What is the patient wearing?: Pants, Incontinence brief     Lower body assist Assist for lower body dressing: Moderate Assistance - Patient 50 - 74%     Toileting Toileting    Toileting assist Assist for toileting: Moderate Assistance - Patient 50 - 74%     Transfers Chair/bed transfer  Transfers assist     Chair/bed transfer assist level: Moderate Assistance - Patient 50 - 74%     Locomotion Ambulation   Ambulation assist      Assist level: Minimal Assistance - Patient > 75% Assistive device: Walker-rolling Max distance: 5   Walk 10 feet activity   Assist     Assist level: Moderate Assistance - Patient - 50 - 74% Assistive device: Walker-rolling   Walk 50 feet activity   Assist Walk 50 feet with 2 turns activity did not occur: Safety/medical concerns  Assist level: Moderate Assistance - Patient -  50 - 74% Assistive device: Walker-rolling    Walk 150 feet activity   Assist Walk 150 feet activity did not occur: Safety/medical concerns         Walk 10 feet on uneven surface  activity   Assist Walk 10 feet on uneven surfaces activity did not occur: Safety/medical concerns         Wheelchair     Assist   Type of Wheelchair: Manual    Wheelchair assist level: Supervision/Verbal cueing Max wheelchair distance: 75    Wheelchair 50  feet with 2 turns activity    Assist        Assist Level: Supervision/Verbal cueing   Wheelchair 150 feet activity     Assist Wheelchair 150 feet activity did not occur: Safety/medical concerns          Medical Problem List and Plan: 1.Right HP and functional deficitssecondary to left > right bi-cerebral infarcts  Continue CIR 2. DVT Prophylaxis/Anticoagulation: Pharmaceutical:Heparin 3. Pain Management:N/A 4. Mood:LCSW to follow for evaluation and support. 5. Neuropsych: This patientiscapable of making decisions on hisown behalf. 6. Skin/Wound Care:Monitor wound daily for healing. Maintain adequate nutritional and hydration status  7. Fluids/Electrolytes/Nutrition:Monitor I's and O's.  8.S/PLeft KPV:VZSM x3 weeks followed by Plavix alone. Continue statin 9.CKD: Continue to monitor with serial checks.    Now with AKI, Creatinine 1.59 on 10/28  IVF x3 days completed on 10/28 10.Seizure disorder: Cont Keppra twice daily 11.T2DM: Hgb Aic- 7.2.Was on metformin and Januvia at home prior to admission Continuetomonitor blood sugars AC/HS basis. Use sliding scale insulin for tighter blood sugar control.  Resumed Januvia. Metformin on hold due to AKI  Amaryl 1 mg started on 10/23  Relatively controlled on 10/29 12.BPH: Continue Flomax and Proscar. 13.E coli UTI: Completed treatment 10/10 D/ced condom cath 14.  Hypoalbuminemia  Supplement initiated on 10/18 15.  Morbid obesity  Encourage weight loss 16.  Labile blood pressure  Labile on 10/29  LOS: 12 days A FACE TO FACE EVALUATION WAS PERFORMED  Emilya Justen [name] 01/11/2018, 8:03 AM

## 2018-01-11 NOTE — Progress Notes (Signed)
Physical Therapy Session Note  Patient Details  Name: [name] [name] MRN: [id number] Date of Birth: December 08, 1934  Today's Date: 01/11/2018 PT Individual Time: 0930-1030 PT Individual Time Calculation (min): 60 min   Short Term Goals: Week 1:  PT Short Term Goal 1 (Week 1): Pt will perform bed mobility with min assist  PT Short Term Goal 1 - Progress (Week 1): Progressing toward goal PT Short Term Goal 2 (Week 1): Pt will ambulate 10f with min assist and LRAD  PT Short Term Goal 2 - Progress (Week 1): Met PT Short Term Goal 3 (Week 1): Pt will perform WC<>bed transfer with min assist consistently PT Short Term Goal 3 - Progress (Week 1): Met PT Short Term Goal 4 (Week 1): PT will initiate stair training  PT Short Term Goal 4 - Progress (Week 1): Partly met  Skilled Therapeutic Interventions/Progress Updates:   Pt in w/c and agreeable to therapy, denies pain at rest. Provided min assist to finish folding clothes at w/c level. Verbal and visual cues for technique while utilizing BUEs. Total assist w/c transport to/from therapy gym. Ambulated 50' x4 reps w/ CGA and close w/c follow for safety. Verbal cues for increased BLE extension in stance and for upright posture. Antalgic gait pattern 2/2 5/10 L knee pain in standing, otherwise adequate R foot clearance. Seated rest break in between gait bouts. Returned to room and ended session in w/c, all needs in reach. Ice applied to L knee for pain relief.   Therapy Documentation Precautions:  Precautions Precautions: Fall Precaution Comments: right hemiparesis Restrictions Weight Bearing Restrictions: No Vital Signs: Therapy Vitals Pulse Rate: 79 BP: (!) 145/93 Patient Position (if appropriate): Sitting Oxygen Therapy SpO2: 96 % O2 Device: Room Air  Therapy/Group: Individual Therapy  Tamiah Dysart K Carlynn Leduc 01/11/2018, 4:30 PM

## 2018-01-12 ENCOUNTER — Inpatient Hospital Stay (HOSPITAL_COMMUNITY): Payer: Medicare HMO | Admitting: Physical Therapy

## 2018-01-12 ENCOUNTER — Inpatient Hospital Stay (HOSPITAL_COMMUNITY): Payer: Medicare HMO | Admitting: Occupational Therapy

## 2018-01-12 LAB — CBC
HEMATOCRIT: 43.5 % (ref 39.0–52.0)
Hemoglobin: 14.1 g/dL (ref 13.0–17.0)
MCH: 31.3 pg (ref 26.0–34.0)
MCHC: 32.4 g/dL (ref 30.0–36.0)
MCV: 96.7 fL (ref 80.0–100.0)
Platelets: 171 10*3/uL (ref 150–400)
RBC: 4.5 MIL/uL (ref 4.22–5.81)
RDW: 12.8 % (ref 11.5–15.5)
WBC: 6.2 10*3/uL (ref 4.0–10.5)
nRBC: 0 % (ref 0.0–0.2)

## 2018-01-12 LAB — GLUCOSE, CAPILLARY
GLUCOSE-CAPILLARY: 106 mg/dL — AB (ref 70–99)
Glucose-Capillary: 103 mg/dL — ABNORMAL HIGH (ref 70–99)
Glucose-Capillary: 170 mg/dL — ABNORMAL HIGH (ref 70–99)
Glucose-Capillary: 88 mg/dL (ref 70–99)

## 2018-01-12 NOTE — Progress Notes (Signed)
Physical Therapy Session Note  Patient Details  Name: [name] [name] MRN: [id number] Date of Birth: 02-20-1935  Today's Date: 01/12/2018 PT Individual Time: 1100-1200 PT Individual Time Calculation (min): 60 min   Short Term Goals: Week 2:     Skilled Therapeutic Interventions/Progress Updates: Pt presented in w/c agreeable to therapy. Session focused on bed mobility and ambulation. Pt transported to rehab gym for energy conservation. Participated in gait training 48ft, then additional 151ft with RW to ortho gym. Pt was able to consistently clear R foot and performed with CGA. In ortho gym blocked practice of sit to/from supine on high/low mat. Performed sit to supine with supervision however required minA and increased time for supine to sit. Pt required cues to come closer to [facility] and attempting to push through elbow. Based on pt's previous set up at home placed RW to R side to use as "bed rail". Pt then ambulated to room and performed sit to/from supine on flat bed without bed rail. Pt consistently required minA for final transition form sidelying to sit. Pt then requesting to use bathroom, performed stand pivot to w/c and transported to bathroom. Pt was able to perform clothing management with minA to doff brief and shorts (-BM). Pt returned to w/c in same manner as prior. Pt transferred to room and left with lunch tray set up and NT present.      Therapy Documentation Precautions:  Precautions Precautions: Fall Precaution Comments: right hemiparesis Restrictions Weight Bearing Restrictions: No General:   Vital Signs:   Pain: Pain Assessment Pain Scale: 0-10 Pain Score: 2  Pain Type: Chronic pain Pain Location: Knee Pain Orientation: Left Pain Descriptors / Indicators: Aching Pain Onset: With Activity Pain Intervention(s): Medication (See eMAR);Repositioned   Therapy/Group: Individual Therapy  Payzlee Ryder  .Joeanthony Seeling, PTA  01/12/2018, 12:22 PM

## 2018-01-12 NOTE — Progress Notes (Signed)
[place] PHYSICAL MEDICINE & REHABILITATION PROGRESS NOTE  Subjective/Complaints: Patient seen laying in bed this morning.  He states he slept well overnight.  He denies complaints.  ROS: Denies CP, SOB, N/V/D  Objective: Vital Signs: Blood pressure 124/76, pulse 64, temperature 97.6 F (36.4 C), resp. rate 20, height 5\' 9"  (1.753 m), weight 106.2 kg, SpO2 92 %. No results found. Recent Labs    01/12/18 0527  WBC 6.2  HGB 14.1  HCT 43.5  PLT 171   Recent Labs    01/10/18 1428  NA 142  K 4.9  CL 109  CO2 23  GLUCOSE 131*  BUN 26*  CREATININE 1.59*  CALCIUM 8.9    Physical Exam: BP 124/76 (BP Location: Left Arm)   Pulse 64   Temp 97.6 F (36.4 C)   Resp 20   Ht 5\' 9"  (1.753 m)   Wt 106.2 kg   SpO2 92%   BMI 34.57 kg/m  Constitutional: No distress . Vital signs reviewed. HENT: Normocephalic.  Atraumatic. Eyes: EOMI. No discharge. Cardiovascular: RRR.  No JVD. Respiratory: CTA bilaterally.  Normal effort. GI: BS +. Non-distended. Musc: No edema or tenderness in extremities. Neurological: He isalertand oriented.  Dysarthria.   Able to follow basic commands without difficulty.  Motor: RUE4+/5, except limited at shoulder  LUE 4+/5, except limited at shoulder, stable RLE 4+/5 prox to distal.  LLE 4+/5 proximal to distal.  Pill-rolling tremor left upper extremity, stable. Skin: Skin iswarmand dry. He isnot diaphoretic.  Psychiatric: He has anormal mood and affect. Hisbehavior is normal.  Assessment/Plan: 1. Functional deficits secondary to left > right bi-cerebral infarcts which require 3+ hours per day of interdisciplinary therapy in a comprehensive inpatient rehab setting.  Physiatrist is providing close team supervision and 24 hour management of active medical problems listed below.  Physiatrist and rehab team continue to assess barriers to discharge/monitor patient progress toward functional and medical goals  Care Tool:  Bathing    Body  parts bathed by patient: Right lower leg, Left lower leg, Face, Right upper leg, Left upper leg, Right arm, Left arm, Chest, Abdomen   Body parts bathed by helper: Buttocks Body parts n/a: Front perineal area, Buttocks   Bathing assist Assist Level: Minimal Assistance - Patient > 75%     Upper Body Dressing/Undressing Upper body dressing   What is the patient wearing?: Pull over shirt    Upper body assist Assist Level: Minimal Assistance - Patient > 75%    Lower Body Dressing/Undressing Lower body dressing      What is the patient wearing?: Pants, Incontinence brief     Lower body assist Assist for lower body dressing: Moderate Assistance - Patient 50 - 74%     Toileting Toileting    Toileting assist Assist for toileting: Moderate Assistance - Patient 50 - 74%     Transfers Chair/bed transfer  Transfers assist     Chair/bed transfer assist level: Minimal Assistance - Patient > 75%     Locomotion Ambulation   Ambulation assist      Assist level: Contact Guard/Touching assist Assistive device: Walker-rolling Max distance: 50'   Walk 10 feet activity   Assist     Assist level: Contact Guard/Touching assist Assistive device: Walker-rolling   Walk 50 feet activity   Assist Walk 50 feet with 2 turns activity did not occur: Safety/medical concerns  Assist level: Contact Guard/Touching assist Assistive device: Walker-rolling    Walk 150 feet activity   Assist Walk 150 feet  activity did not occur: Safety/medical concerns         Walk 10 feet on uneven surface  activity   Assist Walk 10 feet on uneven surfaces activity did not occur: Safety/medical concerns         Wheelchair     Assist   Type of Wheelchair: Manual    Wheelchair assist level: Supervision/Verbal cueing Max wheelchair distance: 75    Wheelchair 50 feet with 2 turns activity    Assist        Assist Level: Supervision/Verbal cueing   Wheelchair 150  feet activity     Assist Wheelchair 150 feet activity did not occur: Safety/medical concerns          Medical Problem List and Plan: 1.Right HP and functional deficitssecondary to left > right bi-cerebral infarcts  Continue CIR 2. DVT Prophylaxis/Anticoagulation: Pharmaceutical:Heparin  CBC within normal limits on 10/30 3. Pain Management:N/A 4. Mood:LCSW to follow for evaluation and support. 5. Neuropsych: This patientiscapable of making decisions on hisown behalf. 6. Skin/Wound Care:Monitor wound daily for healing. Maintain adequate nutritional and hydration status  7. Fluids/Electrolytes/Nutrition:Monitor I's and O's.  8.S/PLeft UJW:JXBJ x3 weeks followed by Plavix alone. Continue statin 9.CKD: Continue to monitor with serial checks.    Now with AKI, Creatinine 1.59 on 10/28  Will order labs for Friday  IVF x3 days completed on 10/28 10.Seizure disorder: Cont Keppra twice daily 11.T2DM: Hgb Aic- 7.2.Was on metformin and Januvia at home prior to admission Continuetomonitor blood sugars AC/HS basis. Use sliding scale insulin for tighter blood sugar control.  Resumed Januvia. Metformin on hold due to AKI  Amaryl 1 mg started on 10/23  Relatively controlled on 10/30 12.BPH: Continue Flomax and Proscar. 13.E coli UTI: Completed treatment 10/10 D/ced condom cath 14.  Hypoalbuminemia  Supplement initiated on 10/18 15.  Morbid obesity  Encourage weight loss 16.  Labile blood pressure  Controlled on 10/30  LOS: 13 days A FACE TO FACE EVALUATION WAS PERFORMED  Alexander Aument [name] 01/12/2018, 7:54 AM

## 2018-01-12 NOTE — Progress Notes (Signed)
Physical Therapy Session Note  Patient Details  Name: [name] [name] MRN: [id number] Date of Birth: 1934/10/20  Today's Date: 01/12/2018 PT Individual Time: 1400-1430 PT Individual Time Calculation (min): 30 min   Short Term Goals: Week 2:    STG=LTG due to ELOS  Skilled Therapeutic Interventions/Progress Updates:   Pt received sitting in WC and agreeable to PT. Pt transported to rehab gym in [facility]. Gait training  RW x 65f with supervision assist from PT. Pt noted to have full step length on the R, but continutes to have decreased stance time on the L due to back and knee pain. Standing balance instructed by PT to performed lateral/anterior reaches to place horse shoes on basketball goal x 5 BUE. Min cues for improved lateral weight shifting L>R. [facility] returned to [facility] and [facility] in reach and all needs met.          Therapy Documentation Precautions:  Precautions Precautions: Fall Precaution Comments: right hemiparesis Restrictions Weight Bearing Restrictions: No Pain: Pain Assessment Pain Scale: Faces Faces Pain Scale: Hurts a little bit Pain Type: Chronic pain Pain Location: Knee Pain Orientation: Left Pain Descriptors / Indicators: Discomfort Pain Onset: With Activity Pain Intervention(s): Repositioned   Therapy/Group: Individual Therapy  ALorie Phenix10/30/2019, 2:47 PM

## 2018-01-12 NOTE — Progress Notes (Signed)
Occupational Therapy Session Note  Patient Details  Name: [name] [name] MRN: [id number] Date of Birth: 12-15-1934  Today's Date: 01/12/2018 OT Individual Time: 2119-4174 OT Individual Time Calculation (min): 57 min    Short Term Goals: Week 2:  OT Short Term Goal 1 (Week 2): Pt will be able to self cleanse with min A post toileting sit to stand OT Short Term Goal 2 (Week 2): Pt will be able to use RUE to pull pants over hips with mod A. OT Short Term Goal 3 (Week 2): Pt will complete all bathing sit to stand with min assist for 2 consecutive sessions.   OT Short Term Goal 4 (Week 2): Pt will perform toilet transfer with min assist ambulating from bedside to the bathroom with use of the RW for support.  Skilled Therapeutic Interventions/Progress Updates:    Pt completed supine to sit EOB on the right side with mod assist and HOB elevated 40 degrees.  Pt still demonstrates decreased strength and coordination for pushing up from sidelying with the RUE to transition to sitting.  Min assist for sit to stand and for ambulation to the elevated toilet.  Pt attempted toileting for BM but was unsuccessful this attempt.  Had him transfer out to the wheelchair at the sink for grooming and partial bathing.  Pt already had on a clean shirt as well as his shoes and socks, so he did not want to remove these for bathing or re-dressing.  Instead he washed his face and arms with supervision as well as his upper legs.  Mod assist for washing buttocks thoroughly in standing.  Mod assist as well for donning underpants and pants.  Pt finished session with grooming tasks of shaving, brushing his hair, and brushing his teeth.  Pt left at bedside in the wheelchair with call button and phone in reach.    Therapy Documentation Precautions:  Precautions Precautions: Fall Precaution Comments: right hemiparesis Restrictions Weight Bearing Restrictions: No  Pain: Pain Assessment Pain Scale: 0-10 Pain Score: 4  Pain  Type: Chronic pain Pain Location: Knee Pain Orientation: Left Pain Descriptors / Indicators: Aching Pain Onset: With Activity Pain Intervention(s): Medication (See eMAR);Repositioned ADL:  Therapy/Group: Individual Therapy  Zennie Ayars OTR/L 01/12/2018, 8:58 AM

## 2018-01-12 NOTE — Plan of Care (Signed)
Long term stair goal D/C due to pt report that he plans on accessing home via ambulance.

## 2018-01-12 NOTE — Progress Notes (Signed)
Occupational Therapy Session Note  Patient Details  Name: [name] [name] MRN: [id number] Date of Birth: 1934-05-13  Today's Date: 01/12/2018 OT Individual Time: 1300-1347 OT Individual Time Calculation (min): 47 min    Short Term Goals: Week 2:  OT Short Term Goal 1 (Week 2): Pt will be able to self cleanse with min A post toileting sit to stand OT Short Term Goal 2 (Week 2): Pt will be able to use RUE to pull pants over hips with mod A. OT Short Term Goal 3 (Week 2): Pt will complete all bathing sit to stand with min assist for 2 consecutive sessions.   OT Short Term Goal 4 (Week 2): Pt will perform toilet transfer with min assist ambulating from bedside to the bathroom with use of the RW for support.  Skilled Therapeutic Interventions/Progress Updates:    Pt worked on sit to stand and standing balance while engaged in RUE use to toss bean bags to the corn hole board.  He was able to complete sit to stand with min assist several times and maintain standing balance.  Transitioned to working on RUE use for picking up apples and fruit with tongs and increased time.  He was also able to self feed with use of the RUE and supervision for finger foods.  Returned to room at end of session with call button and phone in reach.  Took back word search and word jumble for pt to work on in his room.    Therapy Documentation Precautions:  Precautions Precautions: Fall Precaution Comments: right hemiparesis Restrictions Weight Bearing Restrictions: No General:   Vital Signs:   Pain: Pain Assessment Pain Scale: Faces Faces Pain Scale: Hurts a little bit Pain Type: Chronic pain Pain Location: Knee Pain Orientation: Left Pain Descriptors / Indicators: Discomfort Pain Onset: With Activity Pain Intervention(s): Repositioned  Therapy/Group: Individual Therapy  Lemar Bakos OTR/L 01/12/2018, 2:35 PM

## 2018-01-13 ENCOUNTER — Inpatient Hospital Stay (HOSPITAL_COMMUNITY): Payer: Medicare HMO | Admitting: Occupational Therapy

## 2018-01-13 ENCOUNTER — Inpatient Hospital Stay (HOSPITAL_COMMUNITY): Payer: Medicare HMO | Admitting: Physical Therapy

## 2018-01-13 LAB — GLUCOSE, CAPILLARY
GLUCOSE-CAPILLARY: 107 mg/dL — AB (ref 70–99)
GLUCOSE-CAPILLARY: 118 mg/dL — AB (ref 70–99)
Glucose-Capillary: 138 mg/dL — ABNORMAL HIGH (ref 70–99)
Glucose-Capillary: 103 mg/dL — ABNORMAL HIGH (ref 70–99)

## 2018-01-13 NOTE — Progress Notes (Signed)
[facility] PHYSICAL MEDICINE & REHABILITATION PROGRESS NOTE  Subjective/Complaints: Patient seen laying in bed this morning.  He states he slept fairly overnight due to discomfort from the bed.  ROS: Denies CP, SOB, N/V/D  Objective: Vital Signs: Blood pressure 121/62, pulse 88, temperature 98 F (36.7 C), resp. rate 18, height 5\' 9"  (1.753 m), weight 106.2 kg, SpO2 93 %. No results found. Recent Labs    01/12/18 0527  WBC 6.2  HGB 14.1  HCT 43.5  PLT 171   Recent Labs    01/10/18 1428  NA 142  K 4.9  CL 109  CO2 23  GLUCOSE 131*  BUN 26*  CREATININE 1.59*  CALCIUM 8.9    Physical Exam: BP 121/62 (BP Location: Left Arm)   Pulse 88   Temp 98 F (36.7 C)   Resp 18   Ht 5\' 9"  (1.753 m)   Wt 106.2 kg   SpO2 93%   BMI 34.57 kg/m  Constitutional: No distress . Vital signs reviewed. HENT: Normocephalic.  Atraumatic. Eyes: EOMI. No discharge. Cardiovascular: RRR.  No JVD. Respiratory: CTA bilaterally.  Normal effort. GI: BS +. Non-distended. Musc: No edema or tenderness in extremities. Neurological: He isalertand oriented.  Dysarthria.   Able to follow basic commands without difficulty.  Motor: RUE4+/5, except limited at shoulder  LUE 4+/5, except limited at shoulder, unchanged RLE 4+/5 prox to distal.  LLE 4+/5 proximal to distal, unchanged.  Pill-rolling tremor left upper extremity, unchanged. Skin: Skin iswarmand dry. He isnot diaphoretic.  Psychiatric: He has anormal mood and affect. Hisbehavior is normal.  Assessment/Plan: 1. Functional deficits secondary to left > right bi-cerebral infarcts which require 3+ hours per day of interdisciplinary therapy in a comprehensive inpatient rehab setting.  Physiatrist is providing close team supervision and 24 hour management of active medical problems listed below.  Physiatrist and rehab team continue to assess barriers to discharge/monitor patient progress toward functional and medical goals  Care  Tool:  Bathing    Body parts bathed by patient: Right upper leg, Left upper leg, Left arm, Right arm, Face   Body parts bathed by helper: Buttocks Body parts n/a: Chest, Abdomen, Front perineal area, Right lower leg, Left lower leg(Pt did not attempt)   Bathing assist Assist Level: Minimal Assistance - Patient > 75%     Upper Body Dressing/Undressing Upper body dressing   What is the patient wearing?: Pull over shirt    Upper body assist Assist Level: Minimal Assistance - Patient > 75%    Lower Body Dressing/Undressing Lower body dressing      What is the patient wearing?: Underwear/pull up, Pants     Lower body assist Assist for lower body dressing: Moderate Assistance - Patient 50 - 74%     Toileting Toileting    Toileting assist Assist for toileting: Moderate Assistance - Patient 50 - 74%     Transfers Chair/bed transfer  Transfers assist     Chair/bed transfer assist level: Supervision/Verbal cueing     Locomotion Ambulation   Ambulation assist      Assist level: Supervision/Verbal cueing Assistive device: Walker-rolling Max distance: 75ft   Walk 10 feet activity   Assist     Assist level: Supervision/Verbal cueing Assistive device: Walker-rolling   Walk 50 feet activity   Assist Walk 50 feet with 2 turns activity did not occur: Safety/medical concerns  Assist level: Supervision/Verbal cueing Assistive device: Walker-rolling    Walk 150 feet activity   Assist Walk 150 feet activity did  not occur: Safety/medical concerns         Walk 10 feet on uneven surface  activity   Assist Walk 10 feet on uneven surfaces activity did not occur: Safety/medical concerns         Wheelchair     Assist   Type of Wheelchair: Manual    Wheelchair assist level: Supervision/Verbal cueing Max wheelchair distance: 75    Wheelchair 50 feet with 2 turns activity    Assist        Assist Level: Supervision/Verbal cueing    Wheelchair 150 feet activity     Assist Wheelchair 150 feet activity did not occur: Safety/medical concerns          Medical Problem List and Plan: 1.Right HP and functional deficitssecondary to left > right bi-cerebral infarcts  Continue CIR 2. DVT Prophylaxis/Anticoagulation: Pharmaceutical:Heparin  CBC within normal limits on 10/30 3. Pain Management:N/A 4. Mood:LCSW to follow for evaluation and support. 5. Neuropsych: This patientiscapable of making decisions on hisown behalf. 6. Skin/Wound Care:Monitor wound daily for healing. Maintain adequate nutritional and hydration status  7. Fluids/Electrolytes/Nutrition:Monitor I's and O's.  8.S/PLeft QJF:HLKT x3 weeks followed by Plavix alone. Continue statin 9.CKD: Continue to monitor with serial checks.    Now with AKI, Creatinine 1.59 on 10/28  Labs ordered for tomorrow  IVF x3 days completed on 10/28 10.Seizure disorder: Cont Keppra twice daily 11.T2DM: Hgb Aic- 7.2.Was on metformin and Januvia at home prior to admission Continuetomonitor blood sugars AC/HS basis. Use sliding scale insulin for tighter blood sugar control.  Resumed Januvia. Metformin on hold due to AKI  Amaryl 1 mg started on 10/23  Labile on 10/31 12.BPH: Continue Flomax and Proscar. 13.E coli UTI: Completed treatment 10/10 D/ced condom cath 14.  Hypoalbuminemia  Supplement initiated on 10/18 15.  Morbid obesity  Encourage weight loss 16.  Labile blood pressure  Controlled on 10/31  LOS: 14 days A FACE TO FACE EVALUATION WAS PERFORMED  Srijan Givan [name] 01/13/2018, 8:11 AM

## 2018-01-13 NOTE — Progress Notes (Signed)
Occupational Therapy Session Note  Patient Details  Name: [name] [name] MRN: [id number] Date of Birth: Aug 29, 1934  Today's Date: 01/13/2018 OT Individual Time: 1420-1500 OT Individual Time Calculation (min): 40 min    Short Term Goals: Week 2:  OT Short Term Goal 1 (Week 2): Pt will be able to self cleanse with min A post toileting sit to stand OT Short Term Goal 2 (Week 2): Pt will be able to use RUE to pull pants over hips with mod A. OT Short Term Goal 3 (Week 2): Pt will complete all bathing sit to stand with min assist for 2 consecutive sessions.   OT Short Term Goal 4 (Week 2): Pt will perform toilet transfer with min assist ambulating from bedside to the bathroom with use of the RW for support.  Skilled Therapeutic Interventions/Progress Updates:    Pt was transported down to the therapy gym where he transferred to the mat with use of the RW and min assist.  Once on the mat he transitioned to supine with min assist as well.  Therapist completed joint mobilizations bilaterally to the glenohumeral joints secondary to increased tightness with internal and external rotation as well as horizontal abduction and adduction.  Next therapist performed PROM stretching for shoulder flexion and all of the previously stated positions.  Pt with increased tightness in bilateral pectorals and difficulty relaxing for stretching secondary to slight pain.  Progressed to [facility] using therapy ball and hula hoop.  Had pt complete 2 sets of 10 reps for bilateral shoulder flexion while holding therapy ball.  Min facilitation in the RUE to maintain digit extension as well as elbow extension throughout.  Next used the hula hoop and had pt work on shoulder horizontal abduction/adduction bilaterally with min to mod facilitation to increase AROM for washing the opposite shoulders during bathing as well as applying deodorant.  Finished session with transfer back to the wheelchair with min assist and return to the room.   Call button and phone in reach at end of session.    Therapy Documentation Precautions:  Precautions Precautions: Fall Precaution Comments: right hemiparesis Restrictions Weight Bearing Restrictions: No  Pain: Pain Assessment Pain Scale: Faces Faces Pain Scale: Hurts a little bit Pain Type: Chronic pain Pain Location: Shoulder Pain Orientation: Right;Left Pain Descriptors / Indicators: Discomfort;Grimacing Pain Onset: With Activity Pain Intervention(s): Repositioned  Therapy/Group: Individual Therapy  Toya Palacios OTR/L 01/13/2018, 3:23 PM

## 2018-01-13 NOTE — Patient Care Conference (Addendum)
Inpatient RehabilitationTeam Conference and Plan of Care Update Date: 01/12/2018   Time: 2:30 PM    Patient Name: [name] [name]      Medical Record Number: [id number]  Date of Birth: 1934/05/31 Sex: Male         Room/Bed: 4M03C/4M03C-01 Payor Info: Payor: [employer] MEDICARE / Plan: [employer] HMO / Product Type: *No Product type* /    Admitting Diagnosis: CVA  Admit Date/Time:  12/30/2017  3:57 PM Admission Comments: No comment available   Primary Diagnosis:  <principal problem not specified> Principal Problem: <principal problem not specified>  Patient Active Problem List   Diagnosis Date Noted  . Labile blood pressure   . Labile blood glucose   . Hyperlipidemia LDL goal <70   . Hypoalbuminemia due to protein-calorie malnutrition ([place])   . Benign prostatic hyperplasia   . Diabetes mellitus type 2 in obese ([place])   . AKI (acute kidney injury) ([place])   . Stage 3 chronic kidney disease ([place])   . Morbid obesity ([place])   . Acute ischemic left middle cerebral artery (MCA) stroke ([place]) 12/30/2017  . Cerebral infarction ([place])   . Cerebral embolism with cerebral infarction 12/20/2017  . CVA (cerebral vascular accident) ([place]) 12/20/2017  . Acute ischemic stroke ([place])   . History of CVA (cerebrovascular accident)   . Parkinson disease ([place])   . Diastolic dysfunction   . Hypokalemia   . Right sided weakness 12/18/2017  . Chronic venous insufficiency 05/05/2017  . .0... 12/21/2016  . Weakness 12/21/2016  . Contracture of muscle of right upper arm 12/21/2016  . UTI (urinary tract infection) 12/21/2016  . Seizures ([place])   . Left knee pain 09/06/2015  . Hyperlipidemia associated with type 2 diabetes mellitus ([place]) 09/06/2015  . Tremor 09/06/2015  . Urinary frequency 09/06/2015  . Acute urinary retention 08/12/2015  . Hyperkalemia 08/10/2015  . Acute renal failure ([place]) 08/10/2015  . Acute bilateral obstructive uropathy 08/10/2015  . HTN (hypertension) 08/10/2015  . Diabetes  mellitus, type 2 ([place]) 08/10/2015    Expected Discharge Date: Expected Discharge Date: 01/18/18  Team Members Present: Physician leading conference: Dr. Delice Lesch Social Worker Present: Lennart Pall, LCSW Nurse Present: Rayetta Pigg, RN PT Present: Barrie Folk, PT;Rosita Dechalus, PTA OT Present: Clyda Greener, OT SLP Present: Windell Moulding, SLP PPS Coordinator present : Daiva Nakayama, RN, CRRN     Current Status/Progress Goal Weekly Team Focus  Medical   Right HP and functional deficits secondary to left > right bi-cerebral infarcts  Improve mobility, transfers, endurance, AKI, DM  See above   Bowel/Bladder   mostly cont bladder; cont bowel; lbm 10/28  cont b/b with min assist  hourly rounding; assess q shift and prn   Swallow/Nutrition/ Hydration             ADL's   Supervision for UB dressing and bathing with LH sponge,  min assist LB bathing with mod assist for dressing, mod assist for toileting tasks as well.  Min assist for toilet transfers  supervision to min assist  selfcare retraining, transfer training, balance retraining, therapeutic exercise, manual therapy, neuromuscular re-education, pt education   Mobility   minA bed mobilty, CGA STS from w/c, gait up to 113ft with RW CGA  Supervision assist bed mobility, tansfers, and ambulation up to 171ft  Endurance, bed mobility, standing tolerance, endurance   Communication             Safety/Cognition/ Behavioral Observations  Pain   no c/o pain  <3  assess q shift and prn; treat prn   Skin   ecchymosis to abd; abdominal folds have some masd and to groin  Free from skin breakdown  assess q shift and prn; barrier cream prn    Rehab Goals Patient on target to meet rehab goals: Yes *See Care Plan and progress notes for long and short-term goals.     Barriers to Discharge  Current Status/Progress Possible Resolutions Date Resolved   Physician    Medical stability     See above  Therapies, optimize DM meds,  follow labs, encourage fluids      Nursing                  PT                    OT                  SLP                SW                Discharge Planning/Teaching Needs:  Pt to d/c home with "wife", [name], who is blind but can provide home management assistance and very light physical assist.  doubtful will be able to complete teaching as [name] does not have any transportation available to get her to the hospital.  Will request [place] start ASAP upon d/c from CIR.   Team Discussion:  Continue to encourage fluids;  Cont b/b but having urgency with bladder which makes pt resistant to increase fluid intake.  Shoulder ROM continues to be addressed.  Better flexibility overall.  Min/mod assist for b/d and bed mobility.  CGA with tfs;  Supervision/ min assist goals.amb CGA.  Recommend regular rw for more safety.    Revisions to Treatment Plan:  NA    Continued Need for Acute Rehabilitation Level of Care: The patient requires daily medical management by a physician with specialized training in physical medicine and rehabilitation for the following conditions: Daily direction of a multidisciplinary physical rehabilitation program to ensure safe treatment while eliciting the highest outcome that is of practical value to the patient.: Yes Daily medical management of patient stability for increased activity during participation in an intensive rehabilitation regime.: Yes Daily analysis of laboratory values and/or radiology reports with any subsequent need for medication adjustment of medical intervention for : Neurological problems;Diabetes problems;Renal problems   I attest that I was present, lead the team conference, and concur with the assessment and plan of the team.   Jkwon Treptow 01/13/2018, 10:17 AM

## 2018-01-13 NOTE — Progress Notes (Signed)
Occupational Therapy Session Note  Patient Details  Name: [name] [name] MRN: [id number] Date of Birth: 12-29-1934  Today's Date: 01/13/2018 OT Individual Time: 0904-1000 OT Individual Time Calculation (min): 56 min    Short Term Goals: Week 2:  OT Short Term Goal 1 (Week 2): Pt will be able to self cleanse with min A post toileting sit to stand OT Short Term Goal 2 (Week 2): Pt will be able to use RUE to pull pants over hips with mod A. OT Short Term Goal 3 (Week 2): Pt will complete all bathing sit to stand with min assist for 2 consecutive sessions.   OT Short Term Goal 4 (Week 2): Pt will perform toilet transfer with min assist ambulating from bedside to the bathroom with use of the RW for support.  Skilled Therapeutic Interventions/Progress Updates:   Pt worked on bathing and dressing sitting at the sink.  Pt already with clean per area per his report secondary to toileting episode earlier this am.  He wanted to use the current brief and the shorts that he has on already, so did not attempt removal of them.  He completed UB bathing and dressing with setup and use of the LH sponge for support.  He utilized sockaide for donning socks this session secondary to not being able to cross his LEs and reach his feet efficiently.  Min assist for donning socks with mod assist for placement on the sockaide. He donned his shoes with setup only and increased time.  Session completed with oral hygiene and combine of his hair with setup as well.  Finished session with pt in the wheelchair at bedside with call button and phone in reach.     Therapy Documentation Precautions:  Precautions Precautions: Fall Precaution Comments: right hemiparesis Restrictions Weight Bearing Restrictions: No   Pain: Pain Assessment Pain Scale: 0-10 Pain Score: 4  Pain Type: Chronic pain Pain Location: Knee Pain Orientation: Left Pain Descriptors / Indicators: Discomfort Pain Frequency: Intermittent Pain Onset:  With Activity Pain Intervention(s): Repositioned ADL: See Care Plan Section for some details of ADLs  Therapy/Group: Individual Therapy  Elaura Calix OTR/L 01/13/2018, 10:00 AM

## 2018-01-13 NOTE — Progress Notes (Signed)
Social Work Patient ID: [name] [name], male   DOB: [birth year], 83 y.o.   MRN: [id number]   Have reviewed team conference with pt and family. Both aware and agreeable still with targeted d/c date of 11/5 and goals of supervision/ CGA/ min assist overall.  Pt pleased with progress and "wife" is pleased that he agreed to stay the projected LOS "to get as good as he can."  Reviewed HH and DME needs.  Pt and wife still request ambulance transfer home.  Anish Vana, LCSW

## 2018-01-13 NOTE — Progress Notes (Signed)
Physical Therapy Session Note  Patient Details  Name: [name] [name] MRN: [id number] Date of Birth: 27-Mar-1934  Today's Date: 01/13/2018 PT Individual Time: 5361-4431 AND 1105-1200 PT Individual Time Calculation (min): 26 min AND 12mn   Short Term Goals: Week 2:    STG =LTG due to ELOS   Skilled Therapeutic Interventions/Progress Updates:   Pt received supine in bed and agreeable to PT. Supine>sit transfer with CGA assist and min cues for use of the RUE to push into sitting. PT applied Ace wrap to the Lknee sitting EOB.   Ambulatory transfer to bathroom with RW and close supervision assist. Min cues from PT to improved step length on the R and AD management over threshold into bathroom. Toilet transfer with RW and Supervision assist.   Sit<>stand x 2 from toilet with supervision assist and heacy use of rail in bathroom. PT required to perform pericare while standing at toilet. Following pericare, PT instructed pt in Lower body dressing with PT to manage clothes while pt standing with UE support on WC.  Patient returned to [facility] as listed [facility], and [facility] and all needs met.      Session 2.   Pt received sitting in WC and agreeable to PT. [facility] to nustep with supervision assist and RW min cues for full upright posture in transfer.   nustep reciprocal movement training x 5 min +3 min with 2 min break between bouts. Min cues intermittently throughout reciprocal movement training to improved LE ROM and increase speed and amplitude of movement. Supervision assist from to performed [facility].   Gait training x RW x [id number] close supervision assist. Min cues for decreased force of heel contact on the RLE and increased step length as tolerated. Pt reports need for BM and [facility]  Toilet transfer  with RW and supervision assist from PT. Pt able to doff pants in preparation of stand>sit on toilet. PT required to perform pericare while pt standing at toilet with UE support on RW.   Pt transported to rehab gym and performed stepping task over 1" obstacle in parallel bars, supervision assist from PT. Completed x 4 BLE with moderate cues for step height/length and lateral weight shifting to improve foot clearance.   [facility] returned to [facility] and [facility] and all needs met.                  Therapy Documentation Precautions:  Precautions Precautions: Fall Precaution Comments: right hemiparesis Restrictions Weight Bearing Restrictions: No    Pain: Pain Assessment Pain Scale: 0-10 Pain Score: 3  Pain Type: Chronic pain Pain Location: Knee Pain Orientation: Left Pain Descriptors / Indicators: Aching Pain Frequency: Intermittent Pain Onset: With Activity Pain Intervention(s): Medication (See eMAR)    Therapy/Group: Individual Therapy  AuLorie Phenix0/31/2019, 8:34 AM

## 2018-01-14 ENCOUNTER — Inpatient Hospital Stay (HOSPITAL_COMMUNITY): Payer: Medicare HMO | Admitting: Occupational Therapy

## 2018-01-14 ENCOUNTER — Inpatient Hospital Stay (HOSPITAL_COMMUNITY): Payer: Medicare HMO | Admitting: Physical Therapy

## 2018-01-14 DIAGNOSIS — R0981 Nasal congestion: Secondary | ICD-10-CM

## 2018-01-14 LAB — GLUCOSE, CAPILLARY
GLUCOSE-CAPILLARY: 122 mg/dL — AB (ref 70–99)
GLUCOSE-CAPILLARY: 94 mg/dL (ref 70–99)
Glucose-Capillary: 91 mg/dL (ref 70–99)
Glucose-Capillary: 109 mg/dL — ABNORMAL HIGH (ref 70–99)

## 2018-01-14 LAB — BASIC METABOLIC PANEL
Anion gap: 5 (ref 5–15)
BUN: 29 mg/dL — ABNORMAL HIGH (ref 8–23)
CALCIUM: 8.5 mg/dL — AB (ref 8.9–10.3)
CHLORIDE: 111 mmol/L (ref 98–111)
CO2: 24 mmol/L (ref 22–32)
Creatinine, Ser: 1.6 mg/dL — ABNORMAL HIGH (ref 0.61–1.24)
GFR, EST AFRICAN AMERICAN: 45 mL/min — AB (ref >60)
GFR, EST NON AFRICAN AMERICAN: 39 mL/min — AB (ref >60)
Glucose, Bld: 109 mg/dL — ABNORMAL HIGH (ref 70–99)
POTASSIUM: 3.8 mmol/L (ref 3.5–5.1)
Sodium: 140 mmol/L (ref 135–145)

## 2018-01-14 NOTE — Progress Notes (Signed)
Social Work Patient ID: [name] [name], male   DOB: 04-Mar-1935, 83 y.o.   MRN: [id number]   Checking in with pt who is tearful about death of his mother-in-law this morning.  He is agreed to stay and complete CIR, however, therapists and nursing aware of the new issues he is now coping with.  Deante Blough, LCSW

## 2018-01-14 NOTE — Progress Notes (Addendum)
Pt seen and examined with Dr. Donnetta Hutching.  Pt doing rehab in the gym and looks good.  His incision has healed nicely.  He is planned for discharge on Tuesday.  Will cancel his appointment with Dr. Scot Dock on Wednesday and our office will arrange a follow up appointment for 9 months with a carotid duplex.   He has 1-39% right ICA stenosis.   Leontine Locket, [facility] 01/14/2018 2:45 PM I have examined the patient, reviewed and agree with above.  Curt Jews, MD 01/14/2018 2:50 PM

## 2018-01-14 NOTE — Progress Notes (Signed)
Occupational Therapy Session Note  Patient Details  Name: [name] [name] MRN: [id number] Date of Birth: 09-10-1934  Today's Date: 01/14/2018 OT Individual Time: 7510-2585 OT Individual Time Calculation (min): 58 min    Short Term Goals: Week 2:  OT Short Term Goal 1 (Week 2): Pt will be able to self cleanse with min A post toileting sit to stand OT Short Term Goal 2 (Week 2): Pt will be able to use RUE to pull pants over hips with mod A. OT Short Term Goal 3 (Week 2): Pt will complete all bathing sit to stand with min assist for 2 consecutive sessions.   OT Short Term Goal 4 (Week 2): Pt will perform toilet transfer with min assist ambulating from bedside to the bathroom with use of the RW for support.  Skilled Therapeutic Interventions/Progress Updates:     Session 1:   Pt completed bathing, dressing, grooming, and toileting during session.  He needed mod assist for supine to sit EOB on the right side.  He then ambulated to the toilet with min guard assist using the RW for support.  Increased lean to the right noted with mobility as well as flexed knees and hips.  Max assist needed for thorough toilet hygiene.  He then transferred out to the sink for bathing and dressing.  Pt able to reach his underarms better this session for bathing so LH sponge not used.  He completed washing lower legs and feet with supervision as well.  Min guard for sit to stand with mod assist for pulling pants over hips and buttoning them.  He was able to donn his socks and shoes.  Finished session with setup for grooming tasks with pt left up in the wheelchair with call button and phone in reach.    Session 2:  Pt worked on tub/shower transfers with use of the RW and tub bench, which he has at home.  Pt reports that his tub is lower to the ground then the one we have and that he usually holds onto the bars and steps over.  Based on pt's current level of assist and new onset right side weakness, this therapist feels that  the tub bench is the safest method.  Pt needed min assist for transfer into and out of the tub using the bench.  He needed assist with lifting the RLE in and sliding around but was able to complete the LLE without assistance.  He was then able to lift them both out of the tub and slide over to the edge with supervision before standing up with min guard assist.  Also during session had pt sit in his wheelchair at the sink and work on completion of shaving with his electric razor, using the RUE for 50% of task.  Finished session with pt's wheelchair being positioned at the side of the bed for access of the call button and phone.    Therapy Documentation Precautions:  Precautions Precautions: Fall Precaution Comments: right hemiparesis Restrictions Weight Bearing Restrictions: No  Pain: Pain Assessment Pain Score: 0-No pain ADL: See Care Plan section for some ADL details  Therapy/Group: Individual Therapy  Tyquasia Pant OTR/L 01/14/2018, 8:58 AM

## 2018-01-14 NOTE — Progress Notes (Signed)
Physical Therapy Session Note  Patient Details  Name: OSAMU OLGUIN MRN: [id number] Date of Birth: 05-11-1934  Today's Date: 01/14/2018 PT Individual Time: 0908-1003 PT Individual Time Calculation (min): 55 min   Short Term Goals: Week 2:  PT Short Term Goal 1 (Week 2): STG=LTG due to ELOS  Skilled Therapeutic Interventions/Progress Updates:   Pt received sitting in WC and agreeable to PT. Pt noted to be mildly distressed following news that his mother-in-law passed away earlier this AM. Emotional support provided by PT.   Sit<>stand and stand pivot tranfers from [facility] with supervision assist throughout treatment; UE support on RW with min cues for proper UE placement.   Nustep AAROM and reciprocal movement training x 8 min with min cues for full ROM in the RLE  Gait training x 113ft with supervision assist and RW. Min cues from PT for increased step length on the RLE. Pt noted to have full step thright gait pattern for ~ 134ft of gait training.   Bed mobility instructed by PT with Supervision assist to sit on the R side of bed. Cues for sequencing and safety to prevent LOB while transferring into sitting.   Dynamic reaching task with sit<>stand supervision assist from PT. Cues from PT to increased speed and amplitude of movement as well as increased ROM in shoulder.       Therapy Documentation Precautions:  Precautions Precautions: Fall Precaution Comments: right hemiparesis Restrictions Weight Bearing Restrictions: No    Vital Signs: Therapy Vitals Temp: 97.7 F (36.5 C) Temp Source: Oral Pulse Rate: 95 Resp: 18 BP: (!) 146/89 Patient Position (if appropriate): Lying Oxygen Therapy SpO2: 95 % O2 Device: Room Air Pain: Pain Assessment Pain Score: 0-No pain    Therapy/Group: Individual Therapy  [name] 01/14/2018, 10:03 AM

## 2018-01-14 NOTE — Progress Notes (Signed)
Occupational Therapy Weekly Progress Note  Patient Details  Name: [name] [name] MRN: [id number] Date of Birth: 1934/07/15  Beginning of progress report period: January 08, 2018 End of progress report period: January 14, 2018  Today's Date: 01/14/2018 OT Individual Time: 6015-6153 OT Individual Time Calculation (min): 42 min    Patient has met 3 of 4 short term goals.  [name] [name] is continuing to make steady progress with OT at this time.  He has progressed to being able to complete stand pivot transfers and functional mobility to the toilet with min assist using the RW for support.  He still demonstrates increased trunk and knee flexion however with increased lean to the right side.  He is able to complete most UB selfcare at supervision to min assist with use of the LH sponge for washing his upper arms and underarms.  His AROM in both shoulders has improved to allow for greater use with selfcare tasks.  He is currently able to use the RUE at a diminished level for all selfcare tasks and grooming.  Slight increased tone noted in digit flexion with slight flexor tone noted in the elbow and chest.  He is able to complete LB selfcare sit to stand with mod assist for pulling pants over hips.  He is also able to donn his socks and shoes with setup help only, crossing one LE over the other.  Anticipated discharge on 11/5 with current min assist level goals.  Feel pt is on target to reach this.  Recommend continued CIR level therapy until expected discharge.    Patient continues to demonstrate the following deficits: muscle weakness, impaired timing and sequencing, abnormal tone, unbalanced muscle activation and decreased coordination and decreased memory and therefore will continue to benefit from skilled OT intervention to enhance overall performance with BADL and Reduce care partner burden.  Patient progressing toward long term goals..  Continue plan of care.  OT Short Term Goals Week 3:  OT Short  Term Goal 1 (Week 3): Continue working on established LTGs set at [employer] assist level.   Skilled Therapeutic Interventions/Progress Updates:    Pt worked on [place] coordination and functional use in the therapy gym during session.  He was able to transfer from the wheelchair to and from the therapy mat during session with min guard assist using the RW for support.  Once on the mat he worked on picking up and placing pegs to [employer], with therapist moving container of pegs in different locations to promote RUE functional reach across his body for adduction as well as away promoting abduction.  He was also able to complete PVC pipe puzzle using primarily the RUE to place pieces and the LUE at an assist as needed.  He was able to grasp and place pieces with supervision overall with slight synergy pattern of elbow flexion and internal rotation. Finished session with return to the room via wheelchair and pt encouraged to continue working on picking up small foam pieces over the weekend for homework.     Therapy Documentation Precautions:  Precautions Precautions: Fall Precaution Comments: right hemiparesis Restrictions Weight Bearing Restrictions: No  Pain: Pain Assessment Pain Scale: Faces Pain Score: 0-No pain  Therapy/Group: Individual Therapy  [name] [name] OTR/L 01/14/2018, 4:11 PM

## 2018-01-14 NOTE — Progress Notes (Addendum)
[facility] PHYSICAL MEDICINE & REHABILITATION PROGRESS NOTE  Subjective/Complaints: Patient seen laying in bed this morning.  He states he slept well overnight.  He complains of congestion and cough.  ROS: Denies CP, SOB, N/V/D  Objective: Vital Signs: Blood pressure (!) 146/89, pulse 95, temperature 97.7 F (36.5 C), temperature source Oral, resp. rate 18, height 5\' 9"  (1.753 m), weight 106.2 kg, SpO2 95 %. No results found. Recent Labs    01/12/18 0527  WBC 6.2  HGB 14.1  HCT 43.5  PLT 171   Recent Labs    01/14/18 0527  NA 140  K 3.8  CL 111  CO2 24  GLUCOSE 109*  BUN 29*  CREATININE 1.60*  CALCIUM 8.5*    Physical Exam: BP (!) 146/89 (BP Location: Right Arm)   Pulse 95   Temp 97.7 F (36.5 C) (Oral)   Resp 18   Ht 5\' 9"  (1.753 m)   Wt 106.2 kg   SpO2 95%   BMI 34.57 kg/m  Constitutional: No distress . Vital signs reviewed. HENT: Normocephalic.  Atraumatic. Eyes: EOMI. No discharge. Cardiovascular: RRR.  No JVD. Respiratory: CTA bilaterally.  Normal effort. GI: BS +. Non-distended. Musc: No edema or tenderness in extremities. Neurological: He isalertand oriented.  Dysarthria.   Able to follow basic commands without difficulty.  Motor: RUE4+/5, except limited at shoulder  LUE 4+/5, except limited at shoulder, stable RLE 4+/5 prox to distal.  LLE 4+/5 proximal to distal, stable.  Pill-rolling tremor left upper extremity, stable. Skin: Skin iswarmand dry. He isnot diaphoretic.  Psychiatric: He has anormal mood and affect. Hisbehavior is normal.  Assessment/Plan: 1. Functional deficits secondary to left > right bi-cerebral infarcts which require 3+ hours per day of interdisciplinary therapy in a comprehensive inpatient rehab setting.  Physiatrist is providing close team supervision and 24 hour management of active medical problems listed below.  Physiatrist and rehab team continue to assess barriers to discharge/monitor patient progress  toward functional and medical goals  Care Tool:  Bathing    Body parts bathed by patient: Right arm, Chest, Abdomen, Right upper leg, Left upper leg, Right lower leg, Left lower leg, Face, Left arm   Body parts bathed by helper: Buttocks Body parts n/a: Front perineal area, Buttocks   Bathing assist Assist Level: Minimal Assistance - Patient > 75%     Upper Body Dressing/Undressing Upper body dressing   What is the patient wearing?: Pull over shirt    Upper body assist Assist Level: Supervision/Verbal cueing    Lower Body Dressing/Undressing Lower body dressing      What is the patient wearing?: Underwear/pull up, Pants     Lower body assist Assist for lower body dressing: Moderate Assistance - Patient 50 - 74%     Toileting Toileting    Toileting assist Assist for toileting: Maximal Assistance - Patient 25 - 49%     Transfers Chair/bed transfer  Transfers assist     Chair/bed transfer assist level: Contact Guard/Touching assist     Locomotion Ambulation   Ambulation assist      Assist level: Supervision/Verbal cueing Assistive device: Walker-rolling Max distance: 10   Walk 10 feet activity   Assist     Assist level: Supervision/Verbal cueing Assistive device: Walker-rolling   Walk 50 feet activity   Assist Walk 50 feet with 2 turns activity did not occur: Safety/medical concerns  Assist level: Supervision/Verbal cueing Assistive device: Walker-rolling    Walk 150 feet activity   Assist Walk 150 feet  activity did not occur: Safety/medical concerns         Walk 10 feet on uneven surface  activity   Assist Walk 10 feet on uneven surfaces activity did not occur: Safety/medical concerns         Wheelchair     Assist   Type of Wheelchair: Manual    Wheelchair assist level: Supervision/Verbal cueing Max wheelchair distance: 75    Wheelchair 50 feet with 2 turns activity    Assist        Assist Level:  Supervision/Verbal cueing   Wheelchair 150 feet activity     Assist Wheelchair 150 feet activity did not occur: Safety/medical concerns          Medical Problem List and Plan: 1.Right HP and functional deficitssecondary to left > right bi-cerebral infarcts  Continue CIR 2. DVT Prophylaxis/Anticoagulation: Pharmaceutical:Heparin  CBC within normal limits on 10/30 3. Pain Management:N/A 4. Mood:LCSW to follow for evaluation and support. 5. Neuropsych: This patientiscapable of making decisions on hisown behalf. 6. Skin/Wound Care:Monitor wound daily for healing. Maintain adequate nutritional and hydration status  7. Fluids/Electrolytes/Nutrition:Monitor I's and O's.  8.S/PLeft BJY:NWGN x3 weeks followed by Plavix alone. Continue statin 9.CKD: Continue to monitor with serial checks.    Creatinine 1.60 on 11/1, baseline  IVF x3 days completed on 10/28 10.Seizure disorder: Cont Keppra twice daily 11.T2DM: Hgb Aic- 7.2.Was on metformin and Januvia at home prior to admission Continuetomonitor blood sugars AC/HS basis. Use sliding scale insulin for tighter blood sugar control.  Resumed Januvia. Metformin on hold due to AKI  Amaryl 1 mg started on 10/23  Labile on 11/1 12.BPH: Continue Flomax and Proscar. 13.E coli UTI: Completed treatment 10/10 D/ced condom cath 14.  Hypoalbuminemia  Supplement initiated on 10/18 15.  Morbid obesity  Encourage weight loss 16.  Labile blood pressure  Labile on 11/1 17.  Congestion and cough  Robitussin as needed  LOS: 15 days A FACE TO FACE EVALUATION WAS PERFORMED  Deloy Archey [name] 01/14/2018, 8:31 AM

## 2018-01-14 NOTE — Discharge Summary (Addendum)
Physician Discharge Summary  Patient ID: [name] [name] MRN: [id number] DOB/AGE: 20-Jan-1935 83 y.o.  Admit date: 12/30/2017 Discharge date: 01/18/2018  Discharge Diagnoses:  Principal Problem:   Acute ischemic left middle cerebral artery (MCA) stroke (HCC) Active Problems:   Hyperlipidemia LDL goal <70   Hypoalbuminemia due to protein-calorie malnutrition (HCC)   Benign prostatic hyperplasia   Diabetes mellitus type 2 in obese (HCC)   Acute on chronic renal failure (HCC)   Stage 3 chronic kidney disease (HCC)   Morbid obesity (HCC)   Congestion of nasal sinus   Discharged Condition:  Stable    Significant Diagnostic Studies: No results found.  Labs:  Basic Metabolic Panel: BMP Latest Ref Rng & Units 01/14/2018 01/10/2018 01/07/2018  Glucose 70 - 99 mg/dL 109(H) 131(H) 106(H)  BUN 8 - 23 mg/dL 29(H) 26(H) 33(H)  Creatinine 0.61 - 1.24 mg/dL 1.60(H) 1.59(H) 1.63(H)  BUN/Creat Ratio 10 - 24 - - -  Sodium 135 - 145 mmol/L 140 142 144  Potassium 3.5 - 5.1 mmol/L 3.8 4.9 4.0  Chloride 98 - 111 mmol/L 111 109 107  CO2 22 - 32 mmol/L 24 23 24   Calcium 8.9 - 10.3 mg/dL 8.5(L) 8.9 9.1    CBC: CBC Latest Ref Rng & Units 01/12/2018 01/05/2018 12/31/2017  WBC 4.0 - 10.5 K/uL 6.2 7.1 6.2  Hemoglobin 13.0 - 17.0 g/dL 14.1 14.8 14.7  Hematocrit 39.0 - 52.0 % 43.5 46.0 45.8  Platelets 150 - 400 K/uL 171 189 210    CBG: Recent Labs  Lab 01/17/18 0612 01/17/18 1139 01/17/18 1642 01/17/18 2221 01/18/18 0703  GLUCAP 95 94 136* 111* 108*    Brief HPI:   [name] [name] is an 83 year old right-handed male with history of seizure disorder, T2DM, left upper extremity tremors recurrent UTI, questionable TIA event last year; who was admitted on 12/18/2017 with right-sided weakness, dizziness and numbness.  MRI/MRA of brain done revealing multiple small acute infarcts in left MCA territory and left occipital lobe, acute lacunar infarct in right central wall and moderate left P2 and  possible left A1 stenosis.  MRI cervical spine showed moderate DDD most severe at C5/C6 with neural foraminal stenosis.  Carotid Dopplers done revealing left 82 9 9% ICA stenosis.  Dr. Doren Custard was consulted for input and recommended left carotid endarterectomy to reduce risk of further strokes.  Patient underwent left CEA on 12/21/2017 with recommendations to continue DAPT x3 weeks then Plavix alone.  Dr. Trenton Gammon was consulted for input on DDD and did not feel surgical intervention needed at this time.  Patient with deficits in mobility and self-care tasks and CIR recommended for follow-up therapy   Hospital Course: [name] [name] was admitted to rehab 12/30/2017 for inpatient therapies to consist of PT and OT at least three hours five days a week. Past admission physiatrist, therapy team and rehab RN have worked together to provide customized collaborative inpatient rehab. Left neck incision is healing well with resolution of edema.  Incision is clean dry intact.  He continues on DAPT.  He has been seizure-free on Keppra.  Diabetes has been monitored with ac/hs CBG checks and Januvia was resumed with improvement in blood sugars.  P.o. intake has been good and blood sugar started trending up therefore Amaryl was added on 10/23.  Metformin was discontinued due to CKD.  Check of lytes at admission revealed acute on chronic renal failure.  Fluids were encouraged without much improvement therefore he was treated with IV fluids x3 days.  Follow-up labs shows improvement in renal status.  Blood pressures have been monitored on twice daily basis and showing improvement in control.  Nutritional supplement was added due to hypoalbuminemia.  Patient has shown improvement in activity tolerance and activity.  He has progressed to min guard assist level overall.  Family to provide light physical assistance as needed past discharge.  He will continue to receive further follow-up HHPT, OT and social worker via [place].     Rehab course: During patient's stay in rehab weekly team conferences were held to monitor patient's progress, set goals and discuss barriers to discharge. At admission, patient required max assist with ADL tasks to assist with mobility.  He  has had improvement in activity tolerance, balance, postural control as well as ability to compensate for deficits. He has had improvement in functional use RUE  and RLE as well as improvement in awareness.  He requires supervision for upper body care and min assist with lower body care.   He requires min to contact-guard assist for dynamic standing balance and mod assist for toilet hygiene.  He is able to perform transfers with supervision and is ambulating 100 feet with rolling walker and supervision. He requires min assist with left lower extremity management for car transfer.  His family was unable to come in for any family education   Disposition: Home  Diet: Heart healthy/diabetic  Special Instructions: 1.  30-day event monitor recommended for work-up with outpatient basis. 2. Contact your MD for home visit in 1-2 weeks.   Discharge Instructions    Ambulatory referral to Physical Medicine Rehab   Complete by:  As directed    1-2 weeks transitional care appt     Allergies as of 01/18/2018   No Known Allergies     Medication List    STOP taking these medications   heparin 5000 UNIT/ML injection   ibuprofen 200 MG tablet Commonly known as:  ADVIL,MOTRIN   metFORMIN 1000 MG tablet Commonly known as:  GLUCOPHAGE     TAKE these medications   acetaminophen 325 MG tablet Commonly known as:  TYLENOL Take 1-2 tablets (325-650 mg total) by mouth every 4 (four) hours as needed for mild pain.   clopidogrel 75 MG tablet Commonly known as:  PLAVIX Take 1 tablet (75 mg total) by mouth daily.   finasteride 5 MG tablet Commonly known as:  PROSCAR Take 5 mg by mouth daily.   glimepiride 1 MG tablet Commonly known as:  AMARYL Take 1 tablet (1  mg total) by mouth daily with breakfast.   levETIRAcetam 500 MG tablet Commonly known as:  KEPPRA Take 1 tablet (500 mg total) by mouth 2 (two) times daily.   pantoprazole 40 MG tablet Commonly known as:  PROTONIX Take 1 tablet (40 mg total) by mouth daily.   senna-docusate 8.6-50 MG tablet Commonly known as:  Senokot-S Take 2 tablets by mouth at bedtime.   simvastatin 40 MG tablet Commonly known as:  ZOCOR Take 1 tablet (40 mg total) by mouth daily. What changed:    medication strength  how much to take   sitaGLIPtin 50 MG tablet Commonly known as:  JANUVIA Take 1 tablet (50 mg total) by mouth daily.   tamsulosin 0.4 MG Caps capsule Commonly known as:  FLOMAX Take 1 capsule (0.4 mg total) by mouth daily after supper. What changed:  when to take this      Follow-up Information    Jamse Arn, MD Follow up.  Specialty:  Physical Medicine and Rehabilitation Why:  office will call you with follow up appointment Contact information: [address] [place] [address] [phone]        Angelia Mould, MD Follow up.   Specialties:  Vascular Surgery, Cardiology Why:  office will call you for follow up appointment in 9 months.  Contact information: [place] [address] [phone]        Earnie Larsson, MD. Call.   Specialty:  Neurosurgery Why:  for follow up on denerative changes in neck.   Contact information: [address] [place] [address] [phone]        [facility]. Call in 1 day(s).   Why:  for follow up appointment Contact information: [address]     [place] [address] [phone]       Clovia Cuff, MD. Schedule an appointment as soon as possible for a visit in 2 week(s).   Specialty:  Internal Medicine Why:  for hospital follow up appointment Contact information: [address] [place]  [address] [phone]           Signed: Bary Leriche 01/18/2018, 5:10 PM  Delice Lesch, MD, ABPMR 01/18/18

## 2018-01-15 ENCOUNTER — Inpatient Hospital Stay (HOSPITAL_COMMUNITY): Payer: Medicare HMO | Admitting: Physical Therapy

## 2018-01-15 LAB — GLUCOSE, CAPILLARY
GLUCOSE-CAPILLARY: 100 mg/dL — AB (ref 70–99)
Glucose-Capillary: 133 mg/dL — ABNORMAL HIGH (ref 70–99)
Glucose-Capillary: 94 mg/dL (ref 70–99)
Glucose-Capillary: 118 mg/dL — ABNORMAL HIGH (ref 70–99)

## 2018-01-15 MED ORDER — LOPERAMIDE HCL 2 MG PO CAPS
2.0000 mg | ORAL_CAPSULE | Freq: Three times a day (TID) | ORAL | Status: DC | PRN
  Administered 2018-01-15: 2 mg via ORAL
  Filled 2018-01-15: qty 1

## 2018-01-15 NOTE — Progress Notes (Signed)
[name] [name] is a 83 y.o. male Jul 24, 1934 [id number]  Subjective: Complains of loose stools. No new problems. Slept well. Feeling OK.  Objective: Vital signs in last 24 hours: Temp:  [98 F (36.7 C)-98.2 F (36.8 C)] 98 F (36.7 C) (11/02 0352) Pulse Rate:  [87-98] 96 (11/02 0352) Resp:  [18-19] 19 (11/02 0352) BP: (106-147)/(60-84) 134/75 (11/02 0352) SpO2:  [91 %-98 %] 91 % (11/02 0352) Weight change:  Last BM Date: 01/15/18  Intake/Output from previous day: 11/01 0701 - 11/02 0700 In: 994 [P.O.:994] Out: 775 [Urine:775] Last cbgs: CBG (last 3)  Recent Labs    01/14/18 1132 01/14/18 1620 01/14/18 2151  GLUCAP 122* 94 109*     Physical Exam General: No apparent distress   HEENT: not dry Lungs: Normal effort. Lungs clear to auscultation, no crackles or wheezes. Cardiovascular: Regular rate and rhythm, no edema Abdomen: S/NT/ND; BS(+) Musculoskeletal:  unchanged Neurological: No new neurological deficits Wounds: NA   Skin: clear  Aging changes Mental state: Alert, oriented, cooperative    Lab Results: BMET    Component Value Date/Time   NA 140 01/14/2018 0527   NA 142 09/06/2015 1003   K 3.8 01/14/2018 0527   CL 111 01/14/2018 0527   CO2 24 01/14/2018 0527   GLUCOSE 109 (H) 01/14/2018 0527   BUN 29 (H) 01/14/2018 0527   BUN 25 09/06/2015 1003   CREATININE 1.60 (H) 01/14/2018 0527   CALCIUM 8.5 (L) 01/14/2018 0527   GFRNONAA 39 (L) 01/14/2018 0527   GFRAA 45 (L) 01/14/2018 0527   CBC    Component Value Date/Time   WBC 6.2 01/12/2018 0527   RBC 4.50 01/12/2018 0527   HGB 14.1 01/12/2018 0527   HCT 43.5 01/12/2018 0527   PLT 171 01/12/2018 0527   MCV 96.7 01/12/2018 0527   MCH 31.3 01/12/2018 0527   MCHC 32.4 01/12/2018 0527   RDW 12.8 01/12/2018 0527   LYMPHSABS 1.2 12/31/2017 0450   MONOABS 0.6 12/31/2017 0450   EOSABS 0.3 12/31/2017 0450   BASOSABS 0.0 12/31/2017 0450    Studies/Results: No results found.  Medications: I have  reviewed the patient's current medications.  Assessment/Plan:   1.  Left greater than the right by cerebral infarctions.  Right hemiparesis and functional deficits.  Continue with inpatient CIR 2.  DVT prophylaxis with subcu heparin 3.  Status post left CEA: DAPT x3 weeks followed by Plavix alone.  Continue statin 4.  Chronic kidney disease.  Monitoring bmet 5.  Seizure disorder.  Continue with Keppra twice daily 6.  Type 2 diabetes.  Sliding scale insulin as needed.  Januvia was resumed.  Metformin is on hold due to acute renal insufficiency.  Amaryl was restarted. 7.  BPH.  On Flomax and Proscar 8.  E. coli UTI.  Treatment completed 9.  Hypoalbuminemia.  Supplemental protein shakes 10.  Morbid obesity.  On diet. 11.  Left bile blood pressure. 12.  New diarrhea (history of chronic diarrhea).  Imodium as needed      Length of stay, days: [place] , MD 01/15/2018, 12:52 PM

## 2018-01-15 NOTE — Progress Notes (Addendum)
Physical Therapy Session Note  Patient Details  Name: [name] [name] MRN: [id number] Date of Birth: 1934/03/25  Today's Date: 01/15/2018 PT Individual Time: 8315-1761 PT Individual Time Calculation (min): 43 min   Short Term Goals: Week 1:  PT Short Term Goal 1 (Week 1): Pt will perform bed mobility with min assist  PT Short Term Goal 1 - Progress (Week 1): Progressing toward goal PT Short Term Goal 2 (Week 1): Pt will ambulate 68f with min assist and LRAD  PT Short Term Goal 2 - Progress (Week 1): Met PT Short Term Goal 3 (Week 1): Pt will perform WC<>bed transfer with min assist consistently PT Short Term Goal 3 - Progress (Week 1): Met PT Short Term Goal 4 (Week 1): PT will initiate stair training  PT Short Term Goal 4 - Progress (Week 1): Partly met  Skilled Therapeutic Interventions/Progress Updates:  Pt was seen bedside in the pm. Wrapped R knee with ace wrap prior to treatment. Pt performed multiple sit to stand and stand pivot transfers with rolling walker and c/g with verbal cues. Pt propelled w/c about 75 feet with B Ues and S. Pt ambulated 5 feet x 2 with rolling walkerand c/g. Pt rode Nustep at level 2 x 15 minutes with no rest break. Removed ace wrap at end of treatment. Pt returned to room and left sitting up in w/c with call bell within reach.   Therapy Documentation Precautions:  Precautions Precautions: Fall Precaution Comments: right hemiparesis Restrictions Weight Bearing Restrictions: No General:  Pain: Pain Assessment Pain Scale: 0-10 Pain Score: 0-No pain    Therapy/Group: Individual Therapy  MDub Amis11/04/2017, 3:29 PM

## 2018-01-16 ENCOUNTER — Inpatient Hospital Stay (HOSPITAL_COMMUNITY): Payer: Medicare HMO | Admitting: Occupational Therapy

## 2018-01-16 DIAGNOSIS — N189 Chronic kidney disease, unspecified: Secondary | ICD-10-CM

## 2018-01-16 LAB — GLUCOSE, CAPILLARY
GLUCOSE-CAPILLARY: 98 mg/dL (ref 70–99)
GLUCOSE-CAPILLARY: 120 mg/dL — AB (ref 70–99)
GLUCOSE-CAPILLARY: 132 mg/dL — AB (ref 70–99)
Glucose-Capillary: 141 mg/dL — ABNORMAL HIGH (ref 70–99)

## 2018-01-16 NOTE — Progress Notes (Signed)
Occupational Therapy Session Note  Patient Details  Name: [name] [name] MRN: [id number] Date of Birth: 12/20/1934  Today's Date: 01/16/2018 OT Individual Time: 1115-1200 OT Individual Time Calculation (min): 45 min    Short Term Goals: Week 3:  OT Short Term Goal 1 (Week 3): Continue working on established LTGs set at [employer] assist level.   Skilled Therapeutic Interventions/Progress Updates:    Pt seen for OT session focusing on functional standing balance/endurance and UE ROM with use of music. Pt sitting up in w/c upon arrival, agreeable to tx session and denying pain. Pt with requests to go to music therapy/ dance group as he did last week.  1:1 session in group setting. With lots of encouragement and education, pt stood and danced for 2 songs with HHA from therapist, no AD. Pt able to weight shift R/L and anterior/posterior along with beat of music as well as moving arms in all planes while dancing. Pt tolerated standing for entirety of song before requesting seated rest break. During seated songs, he was able to march with B LEs, clap hands, and complete shoulder ROM movements while holding hands with other group members. Pt smiling and dancing throughout session. Pt returned to room at end of session, left set-up with meal tray and all needs in reach.   Therapy Documentation Precautions:  Precautions Precautions: Fall Precaution Comments: right hemiparesis Restrictions Weight Bearing Restrictions: No   Therapy/Group: Individual Therapy  Romana Deaton L 01/16/2018, 12:23 PM

## 2018-01-16 NOTE — Progress Notes (Signed)
[name] [name] is a 83 y.o. male 1934/12/06 [id number]  Subjective: No new complaints. No new problems. Slept well. Feeling OK.  No diarrhea today.  Planning to go home next week.  Objective: Vital signs in last 24 hours: Temp:  [98.1 F (36.7 C)-98.6 F (37 C)] 98.6 F (37 C) (11/03 0440) Pulse Rate:  [91-98] 94 (11/03 0440) Resp:  [17-20] 18 (11/03 0440) BP: (121-146)/(66-77) 146/77 (11/03 0440) SpO2:  [93 %-95 %] 94 % (11/03 0440) Weight change:  Last BM Date: 01/15/18  Intake/Output from previous day: 11/02 0701 - 11/03 0700 In: 960 [P.O.:960] Out: 925 [Urine:925] Last cbgs: CBG (last 3)  Recent Labs    01/15/18 1653 01/15/18 2118 01/16/18 0638  GLUCAP 94 118* 98     Physical Exam General: No apparent distress - eating breakfast HEENT: not dry Lungs: Normal effort. Lungs clear to auscultation, no crackles or wheezes. Cardiovascular: Regular rate and rhythm, no edema Abdomen: S/NT/ND; BS(+) Musculoskeletal:  unchanged Neurological: No new neurological deficits Wounds: N/A    Skin: clear  Aging changes Mental state: Alert, oriented, cooperative    Lab Results: BMET    Component Value Date/Time   NA 140 01/14/2018 0527   NA 142 09/06/2015 1003   K 3.8 01/14/2018 0527   CL 111 01/14/2018 0527   CO2 24 01/14/2018 0527   GLUCOSE 109 (H) 01/14/2018 0527   BUN 29 (H) 01/14/2018 0527   BUN 25 09/06/2015 1003   CREATININE 1.60 (H) 01/14/2018 0527   CALCIUM 8.5 (L) 01/14/2018 0527   GFRNONAA 39 (L) 01/14/2018 0527   GFRAA 45 (L) 01/14/2018 0527   CBC    Component Value Date/Time   WBC 6.2 01/12/2018 0527   RBC 4.50 01/12/2018 0527   HGB 14.1 01/12/2018 0527   HCT 43.5 01/12/2018 0527   PLT 171 01/12/2018 0527   MCV 96.7 01/12/2018 0527   MCH 31.3 01/12/2018 0527   MCHC 32.4 01/12/2018 0527   RDW 12.8 01/12/2018 0527   LYMPHSABS 1.2 12/31/2017 0450   MONOABS 0.6 12/31/2017 0450   EOSABS 0.3 12/31/2017 0450   BASOSABS 0.0 12/31/2017 0450     Studies/Results: No results found.  Medications: I have reviewed the patient's current medications.  Assessment/Plan:   1.  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  Right hemiparesis and functional deficits.  Continue with inpatient CIR 2.  DVT prophylaxis with subcutaneous heparin 3.  Status post left CEA: DAPT x3 weeks followed by Plavix alone.  Continue statin 4.  Chronic kidney disease.  Monitoring blood chemistry 5.  Type 2 diabetes.  Sliding scale insulin as needed.  Januvia was resumed.  Metformin is on hold due to acute renal insufficiency.  Amaryl was restarted 6.  Seizure disorder.  He is on Keppra twice daily 7.  BPH.  Continue with Flomax and Proscar 8.  E. coli UTI.  Antibiotic therapy completed 9.  Hypoalbuminemia.  Supplemental protein shakes 10.  Morbid obesity.  On diet 11.  Labile BP -monitoring BP 12.  Diarrhea.  Imodium as needed     Length of stay, days: [place] , MD 01/16/2018, 11:43 AM

## 2018-01-17 ENCOUNTER — Inpatient Hospital Stay (HOSPITAL_COMMUNITY): Payer: Medicare HMO | Admitting: Physical Therapy

## 2018-01-17 ENCOUNTER — Inpatient Hospital Stay (HOSPITAL_COMMUNITY): Payer: Medicare HMO | Admitting: Occupational Therapy

## 2018-01-17 LAB — GLUCOSE, CAPILLARY
GLUCOSE-CAPILLARY: 111 mg/dL — AB (ref 70–99)
Glucose-Capillary: 95 mg/dL (ref 70–99)
Glucose-Capillary: 94 mg/dL (ref 70–99)
Glucose-Capillary: 136 mg/dL — ABNORMAL HIGH (ref 70–99)

## 2018-01-17 MED ORDER — SIMVASTATIN 40 MG PO TABS: 40.0000 mg | ORAL_TABLET | Freq: Every day | ORAL | 0 refills | Status: AC

## 2018-01-17 MED ORDER — SENNOSIDES-DOCUSATE SODIUM 8.6-50 MG PO TABS: 2.0000 | ORAL_TABLET | Freq: Every day | ORAL | 0 refills | Status: AC

## 2018-01-17 MED ORDER — GLIMEPIRIDE 1 MG PO TABS: 1.0000 mg | ORAL_TABLET | Freq: Every day | ORAL | 0 refills | Status: AC

## 2018-01-17 MED ORDER — TAMSULOSIN HCL 0.4 MG PO CAPS: 0.4000 mg | ORAL_CAPSULE | ORAL | 0 refills | Status: DC

## 2018-01-17 MED ORDER — CLOPIDOGREL BISULFATE 75 MG PO TABS: 75.0000 mg | ORAL_TABLET | Freq: Every day | ORAL | 0 refills | Status: DC

## 2018-01-17 MED ORDER — SITAGLIPTIN PHOSPHATE 50 MG PO TABS: 50.0000 mg | ORAL_TABLET | Freq: Every day | ORAL | 0 refills | Status: AC

## 2018-01-17 MED ORDER — ACETAMINOPHEN 325 MG PO TABS: 325.0000 mg | ORAL_TABLET | ORAL | Status: AC | PRN

## 2018-01-17 NOTE — Progress Notes (Signed)
Occupational Therapy Session Note  Patient Details  Name: [name] [name] MRN: [id number] Date of Birth: 11-21-1934  Today's Date: 01/17/2018 OT Individual Time: 0800-0900 OT Individual Time Calculation (min): 60 min    Short Term Goals: Week 3:  OT Short Term Goal 1 (Week 3): Continue working on established LTGs set at [employer] assist level.   Skilled Therapeutic Interventions/Progress Updates:    Pt began session with transition from supine to sit EOB with mod assist secondary to not being able to push himself up on the right side.  Min guard assist for ambulation to the elevated toilet with rails with use of the RW.  Flexed trunk, hips, and knees with mobility.  Pt with incontinence of bowel before reaching the toilet.  He was able to urinate but no further BM was noted.  Mod assist for clothing management and toilet hygiene thoroughness.  Min guard for ambulation back out to the wheelchair at the sink where he engaged in bathing and dressing tasks sit to stand.  Supervision for washing his UB except for washing his upper shoulders and back.  He could complete this with use of the LH sponge but opted not to this session.  He completed all LB bathing with min assist sit to stand as well with assist from therapist for washing buttocks thoroughly.  He completed UB dressing with supervision and LB dressing with overall min assist for pulling his brief and pants up in the back.  He was able to donn his socks and shoes with supervision.  Finished session with grooming activities from the wheelchair such as brushing teeth, combing hair, and shaving with the electric razor.  Pt placed in reach of call button and phone at end of session still up in the wheelchair.    Therapy Documentation Precautions:  Precautions Precautions: Fall Precaution Comments: right hemiparesis Restrictions Weight Bearing Restrictions: No  Pain: Pain Assessment Pain Scale: 0-10 Pain Score: 0-No pain Pain Type: Chronic  pain Pain Location: Knee Pain Orientation: Left Pain Descriptors / Indicators: Aching Pain Frequency: Intermittent Pain Onset: On-going Patients Stated Pain Goal: 0 Pain Intervention(s): Medication (See eMAR) ADL: See Care Plan Section for details  Therapy/Group: Individual Therapy  Nadelyn Enriques OTR/L 01/17/2018, 9:00 AM

## 2018-01-17 NOTE — Progress Notes (Signed)
Physical Therapy Discharge Summary  Patient Details  Name: [name] [name] MRN: [id number] Date of Birth: 07-Jun-1934  Today's Date: 01/17/2018 PT Individual Time: 0930-1045 PT Individual Time Calculation (min): 75 min    Patient has met 13 of 13 long term goals due to improved activity tolerance, improved balance, improved postural control, increased strength, ability to compensate for deficits, improved attention, improved awareness and improved coordination.  Patient to discharge at an ambulatory level Supervision.   Patient's care partner is independent to provide the necessary cognitive assistance at discharge.  Reasons goals not met: Pt has met all rehab goals.  Recommendation:  Patient will benefit from ongoing skilled PT services in home health setting to continue to advance safe functional mobility, address ongoing impairments in balance, coordination, endurance, strength, and minimize fall risk.  Equipment: No equipment provided. Pt already owns rollator.  Reasons for discharge: treatment goals met and discharge from hospital  Patient/family agrees with progress made and goals achieved: Yes   Skilled Intervention: Pt received seated in w/c in room, agreeable to PT. ACE wrap applied to L knee for increased stability. Per pt report he owns a L knee brace at home that he can use going forwards. Pt has some L knee pain and low back pain at rest, not rated. Pt performs all sit to stand transfers with Supervision to RW throughout therapy session. Ambulation x 100 ft with RW and Supervision, details on gait pattern below. Car transfer with min A for LLE management. Sit to supine with Supervision, supine to sit with min A for trunk control from flat surface. Stairs deferred due to safety concerns and pt will be going home via ambulance. Toilet transfer with Supervision with use of grab bars, pt is able to manage clothing with Supervision and needs assist for pericare. Pt left seated in w/c in  room at end of therapy session.  PT Discharge Precautions/Restrictions Precautions Precautions: Fall Precaution Comments: right hemiparesis Restrictions Weight Bearing Restrictions: No Pain Pain Assessment Pain Scale: 0-10 Pain Score: 1  Pain Type: Chronic pain Pain Location: Knee Pain Orientation: Left Pain Radiating Towards: lower back Pain Descriptors / Indicators: Aching Pain Frequency: Intermittent Pain Onset: On-going Pain Intervention(s): Medication (See eMAR) Vision/Perception  Vision - History Baseline Vision: Wears glasses all the time Perception Perception: Within Functional Limits Praxis Praxis: Intact  Cognition Overall Cognitive Status: Within Functional Limits for tasks assessed Arousal/Alertness: Awake/alert Orientation Level: Oriented X4 Attention: Sustained Sustained Attention: Appears intact Memory: Appears intact Awareness: Appears intact Problem Solving: Appears intact Safety/Judgment: Appears intact Sensation Sensation Light Touch: Appears Intact Proprioception: Impaired by gross assessment(ongoing impairment in RUE and RLE) Coordination Gross Motor Movements are Fluid and Coordinated: No Fine Motor Movements are Fluid and Coordinated: No Coordination and Movement Description: ataxia, decreased speed and amplitude of movement on the R UE Finger Nose Finger Test: ongoing ataxia and decreased coordination in RUE, pt reports improved from eval Heel Shin Test: impaired R to L due to RLE weakness Motor  Motor Motor: Hemiplegia;Abnormal postural alignment and control Motor - Skilled Clinical Observations: R hemiplegia UE >LE. baseline tremor from PD Motor - Discharge Observations: R hemi UE>LE, baseline tremor from PD  Mobility Bed Mobility Bed Mobility: Rolling Right;Rolling Left;Supine to Sit;Sit to Supine Rolling Right: Supervision/verbal cueing Rolling Left: Supervision/Verbal cueing Supine to Sit: Minimal Assistance - Patient > 75% Sit  to Supine: Supervision/Verbal cueing Transfers Transfers: Sit to Stand;Stand to Sit;Stand Pivot Transfers Sit to Stand: Supervision/Verbal cueing Stand to Sit: Supervision/Verbal cueing Stand  Pivot Transfers: Supervision/Verbal cueing Stand Pivot Transfer Details: Verbal cues for precautions/safety;Verbal cues for technique Transfer (Assistive device): Rolling walker Locomotion  Gait Ambulation: Yes Gait Assistance: Supervision/Verbal cueing Gait Distance (Feet): 100 Feet Assistive device: Rolling walker Gait Assistance Details: Verbal cues for precautions/safety Gait Gait: Yes Gait Pattern: Impaired Gait Pattern: Right steppage;Right flexed knee in stance;Left flexed knee in stance;Lateral trunk lean to right;Decreased step length - right Gait velocity: decreased Stairs / Additional Locomotion Stairs: No Wheelchair Mobility Wheelchair Mobility: Yes Wheelchair Assistance: Chartered loss adjuster: Both upper extremities Wheelchair Parts Management: Needs assistance Distance: 100  Trunk/Postural Assessment  Cervical Assessment Cervical Assessment: Within Functional Limits Thoracic Assessment Thoracic Assessment: Exceptions to WFL(flexed trunk) Lumbar Assessment Lumbar Assessment: Exceptions to WFL(posterior pelvic tilt) Postural Control Postural Control: Deficits on evaluation(ongoing R lateral lean, improved from eval)  Balance Balance Balance Assessed: Yes Dynamic Sitting Balance Dynamic Sitting - Level of Assistance: 5: Stand by assistance Static Standing Balance Static Standing - Level of Assistance: 5: Stand by assistance Dynamic Standing Balance Dynamic Standing - Level of Assistance: 5: Stand by assistance Extremity Assessment   RLE Assessment RLE Assessment: Within Functional Limits Active Range of Motion (AROM) Comments: lacking ~10 degrees full extension due to premorbid OA and tight HS General Strength Comments: 4+/5 proximal to  distal  LLE Assessment LLE Assessment: Within Functional Limits Active Range of Motion (AROM) Comments: lack ~5 degrees full extension due to premorbid OA and HS tightness.  General Strength Comments: 5/5 Proximal to distal      Excell Seltzer, PT, DPT 01/17/2018, 12:30 PM

## 2018-01-17 NOTE — Progress Notes (Signed)
Occupational Therapy Discharge Summary  Patient Details  Name: [name] [name] MRN: [id number] Date of Birth: 05/28/1934  Today's Date: 01/17/2018 OT Individual Time: 8309-4076 OT Individual Time Calculation (min): 43 min   Session Note:  Pt completed transfer stand pivot from the wheelchair to the therapy mat with min guard assist using the RW for support.  He was able to complete cane exercises in supine for shoulder flexion bilaterally as well as horizontal adduction abduction for 1 set of 10 repetitions each.  Had him transition to sitting to complete bilateral elbow flexion/extension for 1 set of 10 repetitions as well.  He also completed functional reaching and FM coordination task of picking up small 1" pegs from container and placing them in the peg board with the RUE.  Min facilitation to avoid head lean to the left and shoulder hike to the left as well.  Finished session with transfer back to the wheelchair at min guard assist and therapist taking him back to the room with call button and phone in reach.    Patient has met 7 of 9 long term goals due to improved activity tolerance, improved balance, ability to compensate for deficits and functional use of  RIGHT upper extremity.  Patient to discharge at [facility] Assist level.  Patient's care partner unavailable to come in for family education.  Pt will be able to direct them in  assistance needed for ADLs at discharge.    Reasons goals not met: Pt needs mod assist for toilet hygiene and min contact guard for dynamic standing balance  Recommendation:  Patient will benefit from ongoing skilled OT services in home health setting to continue to advance functional skills in the area of BADL and Reduce care partner burden.  Equipment: RW  Reasons for discharge: treatment goals met and discharge from hospital  Patient/family agrees with progress made and goals achieved: Yes  OT Discharge Precautions/Restrictions   Precautions Precautions: Fall Precaution Comments: right hemiparesis Restrictions Weight Bearing Restrictions: No   Pain Pain Assessment Pain Score: 0-No pain ADL ADL Eating: Modified independent Grooming: Setup Where Assessed-Grooming: Sitting at sink Upper Body Bathing: Supervision/safety Where Assessed-Upper Body Bathing: Sitting at sink Lower Body Bathing: Minimal assistance Where Assessed-Lower Body Bathing: Standing at sink, Sitting at sink Upper Body Dressing: Supervision/safety Where Assessed-Upper Body Dressing: Sitting at sink Lower Body Dressing: Minimal assistance Where Assessed-Lower Body Dressing: Sitting at sink, Standing at sink Toileting: Moderate assistance Where Assessed-Toileting: Glass blower/designer: Therapist, music Method: Counselling psychologist: Raised toilet seat, Grab bars Tub/Shower Transfer: Minimal Museum/gallery conservator Method: Optometrist: Facilities manager: Curator Method: Heritage manager: Radio broadcast assistant Vision Baseline Vision/History: Wears glasses Wears Glasses: At all times Patient Visual Report: No change from baseline Vision Assessment?: Yes Eye Alignment: Within Functional Limits Ocular Range of Motion: Restricted looking up Tracking/Visual Pursuits: Decreased smoothness of vertical tracking;Decreased smoothness of horizontal tracking;Other (comment)(pt would overshoot and anticipate tracking.  Noted decreased superior movement bilateraly as well.  ) Saccades: Additional eye shifts occurred during testing Visual Fields: No apparent deficits Perception  Perception: Within Functional Limits Praxis Praxis: Intact Cognition Overall Cognitive Status: Within Functional Limits for tasks assessed Arousal/Alertness: Awake/alert Orientation Level: Oriented X4 Attention: Sustained Sustained Attention:  Appears intact Memory: Impaired(Pt needs mod instructional cueing for hand placement with functional transfers) Memory Impairment: Decreased recall of new information;Decreased short term memory Awareness: Impaired Awareness Impairment: Intellectual impairment;Anticipatory impairment Problem Solving:  Impaired Safety/Judgment: Impaired Comments: Pt still states that he will try things the way he has been doing them at home even though he is different from a functional standpoint.  Decreased anticipatory awareness noted Sensation Sensation Light Touch: Impaired Detail Light Touch Impaired Details: Impaired RUE(decreased light touch in the digits) Hot/Cold: Not tested Proprioception: Impaired Detail Proprioception Impaired Details: Impaired RUE Additional Comments: Decreased proprioception in the right wrist and hand Coordination Gross Motor Movements are Fluid and Coordinated: No Fine Motor Movements are Fluid and Coordinated: No Coordination and Movement Description: Pt with slight ataxia in the right hand with functional use as well as synergy pattern and increased flexor tone in the digits.  He uses the RUE at a diminished level for selfcare tasks and transfers.  Motor  Motor Motor: Hemiplegia;Abnormal postural alignment and control Motor - Discharge Observations: R hemi UE>LE Mobility  Bed Mobility Rolling Right: Supervision/verbal cueing Supine to Sit: Moderate Assistance - Patient 50-74% Sit to Supine: Minimal Assistance - Patient > 75% Transfers Sit to Stand: Contact Guard/Touching assist Stand to Sit: Supervision/Verbal cueing  Trunk/Postural Assessment  Cervical Assessment Cervical Assessment: Exceptions to WFL(forward head and head tilt to the left.  Decreased AROM extension) Thoracic Assessment Thoracic Assessment: Exceptions to WFL(Moderate thoracic rounding) Lumbar Assessment Lumbar Assessment: Exceptions to WFL(posteiror pelvic tilt at rest with decreased lumbar  extension to neutral noted)  Balance Balance Balance Assessed: Yes Static Sitting Balance Static Sitting - Balance Support: Feet supported Static Sitting - Level of Assistance: 7: Independent Dynamic Sitting Balance Dynamic Sitting - Level of Assistance: 5: Stand by assistance Static Standing Balance Static Standing - Level of Assistance: 5: Stand by assistance Dynamic Standing Balance Dynamic Standing - Balance Support: During functional activity Dynamic Standing - Level of Assistance: Other (comment)(Min guard with use of the RW) Extremity/Trunk Assessment RUE Assessment RUE Assessment: Exceptions to [facility] Passive Range of Motion (PROM) Comments: Shoulder flexion 0-80 degrees.  Limitations in horiziontal abduction and adduction as well as internal and external rotation coupled with shoulder flexion Active Range of Motion (AROM) Comments: AROM shoulder flexion 0-80 degrees.  Limitations with all shoulder movements as well as limited digit extension to approximately 80% of full AROM.  Decreased ability to complete FM tasks as well with inability to oppose thumb to third and fourth digits.   General Strength Comments: Pt still with slight synergy pattern and flexor tightness in the digits.  He uses the UE functionally at a diminshed level for selfcare tasks.   Brunstrum levels for arm and hand: Arm;Hand Brunstrum level for arm: Stage V Relative Independence from Synergy Brunstrum level for hand: Stage V Independence from basic synergies LUE Assessment LUE Assessment: Exceptions to [facility] Passive Range of Motion (PROM) Comments: sh flexion to 80, Limitations in horiziontal abduction and adduction as well as internal and external rotation coupled with shoulder flexion Active Range of Motion (AROM) Comments: sh flexion to 80, Limitations in shoulder horizontal adduction for washing the right arm and underarm.  increased pain with AAROM attempts. General Strength Comments: Pill rolling tremor in the  hand at rest,  hand and elbow strength 4/5.     Alphonza Tramell OTR/L 01/17/2018, 4:13 PM

## 2018-01-17 NOTE — Progress Notes (Signed)
[place] PHYSICAL MEDICINE & REHABILITATION PROGRESS NOTE  Subjective/Complaints: Patient seen laying in bed this AM.  He states he slept well overnight.  He had a good weekend and is looking forward to discharge tomorrow.  ROS: Denies CP, SOB, N/V/D  Objective: Vital Signs: Blood pressure 137/70, pulse 92, temperature 98.4 F (36.9 C), temperature source Oral, resp. rate 16, height 5\' 9"  (1.753 m), weight 106.2 kg, SpO2 93 %. No results found. No results for input(s): WBC, HGB, HCT, PLT in the last 72 hours. No results for input(s): NA, K, CL, CO2, GLUCOSE, BUN, CREATININE, CALCIUM in the last 72 hours.  Physical Exam: BP 137/70 (BP Location: Left Arm)   Pulse 92   Temp 98.4 F (36.9 C) (Oral)   Resp 16   Ht 5\' 9"  (1.753 m)   Wt 106.2 kg   SpO2 93%   BMI 34.57 kg/m  Constitutional: No distress . Vital signs reviewed. HENT: Normocephalic.  Atraumatic. Eyes: EOMI. No discharge. Cardiovascular: RRR. No JVD. Respiratory: CTA bilaterally. Normal effort. GI: BS +. Non-distended. Musc: No edema or tenderness in extremities. Neurological: He isalertand oriented.  Dysarthria.   Able to follow basic commands without difficulty.  Motor: RUE 4+/5, except limited at shoulder  LUE 4+/5, except limited at shoulder, improving RLE 4+/5 prox to distal, stable.  LLE 4+/5 proximal to distal, stable.  Pill-rolling tremor left upper extremity, stable. Skin: Skin iswarmand dry. He isnot diaphoretic.  Psychiatric: He has anormal mood and affect. Hisbehavior is normal.  Assessment/Plan: 1. Functional deficits secondary to left > right bi-cerebral infarcts which require 3+ hours per day of interdisciplinary therapy in a comprehensive inpatient rehab setting.  Physiatrist is providing close team supervision and 24 hour management of active medical problems listed below.  Physiatrist and rehab team continue to assess barriers to discharge/monitor patient progress toward functional  and medical goals  Care Tool:  Bathing    Body parts bathed by patient: Chest, Left arm, Right arm   Body parts bathed by helper: Buttocks, Front perineal area Body parts n/a: Front perineal area, Buttocks   Bathing assist Assist Level: Minimal Assistance - Patient > 75%     Upper Body Dressing/Undressing Upper body dressing   What is the patient wearing?: Pull over shirt    Upper body assist Assist Level: Supervision/Verbal cueing    Lower Body Dressing/Undressing Lower body dressing      What is the patient wearing?: Pants     Lower body assist Assist for lower body dressing: Minimal Assistance - Patient > 75%     Toileting Toileting    Toileting assist Assist for toileting: Moderate Assistance - Patient 50 - 74%     Transfers Chair/bed transfer  Transfers assist     Chair/bed transfer assist level: Supervision/Verbal cueing     Locomotion Ambulation   Ambulation assist      Assist level: Contact Guard/Touching assist Assistive device: Walker-rolling Max distance: 5   Walk 10 feet activity   Assist     Assist level: Supervision/Verbal cueing Assistive device: Walker-rolling   Walk 50 feet activity   Assist Walk 50 feet with 2 turns activity did not occur: Safety/medical concerns  Assist level: Supervision/Verbal cueing Assistive device: Walker-rolling    Walk 150 feet activity   Assist Walk 150 feet activity did not occur: Safety/medical concerns         Walk 10 feet on uneven surface  activity   Assist Walk 10 feet on uneven surfaces activity did not  occur: Safety/medical concerns         Wheelchair     Assist   Type of Wheelchair: Manual    Wheelchair assist level: Supervision/Verbal cueing Max wheelchair distance: 75    Wheelchair 50 feet with 2 turns activity    Assist        Assist Level: Supervision/Verbal cueing   Wheelchair 150 feet activity     Assist Wheelchair 150 feet activity did  not occur: Safety/medical concerns          Medical Problem List and Plan: 1.Right HP and functional deficitssecondary to left > right bi-cerebral infarcts  Continue CIR  Plan for d/c tomorrow  Will see patient for transitional care management in 1-2 weeks post-discharge  Weekend notes reviewed 2. DVT Prophylaxis/Anticoagulation: Pharmaceutical:Heparin  CBC within normal limits on 10/30 3. Pain Management:N/A 4. Mood:LCSW to follow for evaluation and support. 5. Neuropsych: This patientiscapable of making decisions on hisown behalf. 6. Skin/Wound Care:Monitor wound daily for healing. Maintain adequate nutritional and hydration status  7. Fluids/Electrolytes/Nutrition:Monitor I's and O's.  8.S/PLeft QAS:UORV x3 weeks followed by Plavix alone. Continue statin 9.CKD: Continue to monitor with serial checks.    Creatinine 1.60 on 11/1, baseline  IVF x3 days completed on 10/28 10.Seizure disorder: Cont Keppra twice daily 11.T2DM: Hgb Aic- 7.2.Was on metformin and Januvia at home prior to admission Continuetomonitor blood sugars AC/HS basis. Use sliding scale insulin for tighter blood sugar control.  Resumed Januvia. Metformin on hold due to AKI  Amaryl 1 mg started on 10/23  Labile on 11/4 12.BPH: Continue Flomax and Proscar. 13.E coli UTI: Completed treatment 10/10 D/ced condom cath 14.  Hypoalbuminemia  Supplement initiated on 10/18 15.  Morbid obesity  Encourage weight loss 16.  Labile blood pressure  Controlled on 11/4 17.  Congestion and cough  Robitussin as needed  LOS: 18 days A FACE TO FACE EVALUATION WAS PERFORMED   [name] 01/17/2018, 8:37 AM

## 2018-01-18 DIAGNOSIS — Z23 Encounter for immunization: Secondary | ICD-10-CM | POA: Diagnosis not present

## 2018-01-18 LAB — GLUCOSE, CAPILLARY: GLUCOSE-CAPILLARY: 108 mg/dL — AB (ref 70–99)

## 2018-01-18 MED ORDER — TAMSULOSIN HCL 0.4 MG PO CAPS: 0.4000 mg | ORAL_CAPSULE | ORAL | 0 refills | Status: AC

## 2018-01-18 NOTE — Progress Notes (Signed)
Social Work  Discharge Note  The overall goal for the admission was met for:   Discharge location: Yes - home with wife and her son who can provide supervision/ light physical assistance.  Length of Stay: Yes - 19 days  Discharge activity level: Yes - min-guard overall  Home/community participation: Yes  Services provided included: MD, RD, PT, OT, SLP, RN, TR and Pharmacy  Financial Services: [employer]  Follow-up services arranged: Home Health: PT, OT, SW via [facility], DME: rolling walker via [facility] and Patient/Family request agency HH: [facility], DME: [facility]  Comments (or additional information): pt requests ambulance transport home and this was arranged via [facility]  Patient/Family verbalized understanding of follow-up arrangements: Yes  Individual responsible for coordination of the follow-up plan: pt  Confirmed correct DME delivered: [facility] 01/18/2018    Brynnleigh Mcelwee

## 2018-01-18 NOTE — Discharge Instructions (Signed)
Inpatient Rehab Discharge Instructions  [name] [name] Discharge date and time: 01/18/18   Activities/Precautions/ Functional Status: Activity: no lifting, driving, or strenuous exercise till cleared by MD Diet: cardiac diet and diabetic diet Wound Care: keep wound clean and dry   Functional status:   ___ No restrictions     ___ Walk up steps independently _X__ 24/7 supervision/assistance   ___ Walk up steps with assistance ___ Intermittent supervision/assistance  ___ Bathe/dress independently ___ Walk with walker                   _X__ Bathe/dress with assistance ___ Walk Independently    ___ Shower independently _X__ Walk with assistance    ___ Shower with assistance _X__ No alcohol     ___ Return to work/school ________   COMMUNITY REFERRALS UPON DISCHARGE:    Home Health:   PT     OT      SW                   Agency:  [facility] Phone:  [phone]   Medical Equipment/Items Ordered:  Rolling walker                                                      Agency/Supplier:  [facility] [phone]  Special Instructions: 1. Monitor blood sugars twice a day. 2. Needs assistance with bathing and dressing. Needs supervision when walking.     STROKE/TIA DISCHARGE INSTRUCTIONS SMOKING Cigarette smoking nearly doubles your risk of having a stroke & is the single most alterable risk factor  If you smoke or have smoked in the last 12 months, you are advised to quit smoking for your health.  Most of the excess cardiovascular risk related to smoking disappears within a year of stopping.  Ask you doctor about anti-smoking medications  [place] Quit Line: 1-800-QUIT NOW  Free Smoking Cessation Classes [phone]  CHOLESTEROL Know your levels; limit fat & cholesterol in your diet  Lipid Panel     Component Value Date/Time   CHOL 137 12/19/2017 0740   CHOL 113 09/06/2015 1003   TRIG 116 12/19/2017 0740   HDL 33 (L) 12/19/2017 0740   HDL 40 09/06/2015 1003   CHOLHDL 4.2  12/19/2017 0740   VLDL 23 12/19/2017 0740   LDLCALC 81 12/19/2017 0740   LDLCALC 40 09/06/2015 1003      Many patients benefit from treatment even if their cholesterol is at goal.  Goal: Total Cholesterol (CHOL) less than 160  Goal:  Triglycerides (TRIG) less than 150  Goal:  HDL greater than 40  Goal:  LDL (LDLCALC) less than 100   BLOOD PRESSURE American Stroke Association blood pressure target is less that 120/80 mm/Hg  Your discharge blood pressure is:  BP: (!) 81/66  Monitor your blood pressure  Limit your salt and alcohol intake  Many individuals will require more than one medication for high blood pressure  DIABETES (A1c is a blood sugar average for last 3 months) Goal HGBA1c is under 7% (HBGA1c is blood sugar average for last 3 months)  Diabetes:     Lab Results  Component Value Date   HGBA1C 7.2 (H) 12/19/2017     Your HGBA1c can be lowered with medications, healthy diet, and exercise.  Check your blood sugar as directed  by your physician  Call your physician if you experience unexplained or low blood sugars.  PHYSICAL ACTIVITY/REHABILITATION Goal is 30 minutes at least 4 days per week  Activity: No driving, Therapies: see above Return to work: N/A  Activity decreases your risk of heart attack and stroke and makes your heart stronger.  It helps control your weight and blood pressure; helps you relax and can improve your mood.  Participate in a regular exercise program.  Talk with your doctor about the best form of exercise for you (dancing, walking, swimming, cycling).  DIET/WEIGHT Goal is to maintain a healthy weight  Your discharge diet is:  Diet Order            Diet Carb Modified Fluid consistency: Thin; Room service appropriate? Yes  Diet effective now              liquids Your height is:  Height: 5\' 9"  (175.3 cm) Your current weight is: Weight: 106.2 kg Your Body Mass Index (BMI) is:  BMI (Calculated): 34.56  Following the type of diet  specifically designed for you will help prevent another stroke.  Your goal weight is:    Your goal Body Mass Index (BMI) is 19-24.  Healthy food habits can help reduce 3 risk factors for stroke:  High cholesterol, hypertension, and excess weight.  RESOURCES Stroke/Support Group:  Call [phone]   STROKE EDUCATION PROVIDED/REVIEWED AND GIVEN TO PATIENT Stroke warning signs and symptoms How to activate emergency medical system (call 911). Medications prescribed at discharge. Need for follow-up after discharge. Personal risk factors for stroke. Pneumonia vaccine given:  Flu vaccine given:  My questions have been answered, the writing is legible, and I understand these instructions.  I will adhere to these goals & educational materials that have been provided to me after my discharge from the hospital.     My questions have been answered and I understand these instructions. I will adhere to these goals and the provided educational materials after my discharge from the hospital.  Patient/Caregiver Signature _______________________________ Date __________  Clinician Signature _______________________________________ Date __________  Please bring this form and your medication list with you to all your follow-up doctor's appointments.

## 2018-01-18 NOTE — Progress Notes (Signed)
Pt transported via  [facility] with belongings and RW. No concerns or c/o noted. Pt DC at this time.   Erie Noe, LPN 79/44/46 1:90 AM

## 2018-01-18 NOTE — Progress Notes (Signed)
Pt DC summary completed by nurse. No questions regarding medications or appts. Pt belongings packed up and ready to DC. Pt will be transported via [facility]. No c/o pain noted. Will f/up when leaving.  Erie Noe, LPN 1:69 AM

## 2018-01-19 ENCOUNTER — Telehealth: Payer: Self-pay

## 2018-01-19 ENCOUNTER — Encounter: Payer: Medicare HMO | Admitting: Vascular Surgery

## 2018-01-19 DIAGNOSIS — M1990 Unspecified osteoarthritis, unspecified site: Secondary | ICD-10-CM | POA: Diagnosis not present

## 2018-01-19 DIAGNOSIS — N183 Chronic kidney disease, stage 3 (moderate): Secondary | ICD-10-CM | POA: Diagnosis not present

## 2018-01-19 DIAGNOSIS — I69351 Hemiplegia and hemiparesis following cerebral infarction affecting right dominant side: Secondary | ICD-10-CM | POA: Diagnosis not present

## 2018-01-19 DIAGNOSIS — I872 Venous insufficiency (chronic) (peripheral): Secondary | ICD-10-CM | POA: Diagnosis not present

## 2018-01-19 DIAGNOSIS — E1122 Type 2 diabetes mellitus with diabetic chronic kidney disease: Secondary | ICD-10-CM | POA: Diagnosis not present

## 2018-01-19 DIAGNOSIS — Z48812 Encounter for surgical aftercare following surgery on the circulatory system: Secondary | ICD-10-CM | POA: Diagnosis not present

## 2018-01-19 DIAGNOSIS — G20 Parkinson's disease: Secondary | ICD-10-CM | POA: Diagnosis not present

## 2018-01-19 DIAGNOSIS — I129 Hypertensive chronic kidney disease with stage 1 through stage 4 chronic kidney disease, or unspecified chronic kidney disease: Secondary | ICD-10-CM | POA: Diagnosis not present

## 2018-01-19 DIAGNOSIS — G40909 Epilepsy, unspecified, not intractable, without status epilepticus: Secondary | ICD-10-CM | POA: Diagnosis not present

## 2018-01-19 NOTE — Telephone Encounter (Addendum)
Transitional Care call--patient and he states he has stairs at his house and can't get out, also he has no transportation to come to the appt. He states that he had to be brought home in an ambulance.    1. Are you/is patient experiencing any problems since coming home?no Are there any questions regarding any aspect of care?no 2. Are there any questions regarding medications administration/dosing? No Are meds being taken as prescribed? Yes Patient should review meds with caller to confirm 3. Have there been any falls? no 4. Has Home Health been to the house and/or have they contacted you? yes  If not, have you tried to contact them? Can we help you contact them? 5. Are bowels and bladder emptying properly? yes Are there any unexpected incontinence issues?no If applicable, is patient following bowel/bladder programs? 6. Any fevers, problems with breathing, unexpected pain? no 7. Are there any skin problems or new areas of breakdown?no 8. Has the patient/family member arranged specialty MD follow up (ie cardiology/neurology/renal/surgical/etc)? Dr. Daphene Jaeger has came to the house to f/u but waiting until he is able to leave the house to f/u with other doctors Can we help arrange? 9. Does the patient need any other services or support that we can help arrange?no 10. Are caregivers following through as expected in assisting the patient?yes 11. Has the patient quit smoking, drinking alcohol, or using drugs as recommended?yes  Appointment time, arrive time 2:20pm got 2:40pm appt on 01/28/18 with Dr. Posey Pronto [address] [phone]

## 2018-01-20 ENCOUNTER — Telehealth: Payer: Self-pay

## 2018-01-20 NOTE — Telephone Encounter (Signed)
[facility] PT [facility] called requesting verbal orders for 2xwk X 4wks for strength, balance, form, ambulation, and stairs training.  Called her back and approved verbal orders.

## 2018-01-21 DIAGNOSIS — Z48812 Encounter for surgical aftercare following surgery on the circulatory system: Secondary | ICD-10-CM | POA: Diagnosis not present

## 2018-01-21 DIAGNOSIS — I69351 Hemiplegia and hemiparesis following cerebral infarction affecting right dominant side: Secondary | ICD-10-CM | POA: Diagnosis not present

## 2018-01-21 DIAGNOSIS — N183 Chronic kidney disease, stage 3 (moderate): Secondary | ICD-10-CM | POA: Diagnosis not present

## 2018-01-21 DIAGNOSIS — M1990 Unspecified osteoarthritis, unspecified site: Secondary | ICD-10-CM | POA: Diagnosis not present

## 2018-01-21 DIAGNOSIS — I872 Venous insufficiency (chronic) (peripheral): Secondary | ICD-10-CM | POA: Diagnosis not present

## 2018-01-21 DIAGNOSIS — I129 Hypertensive chronic kidney disease with stage 1 through stage 4 chronic kidney disease, or unspecified chronic kidney disease: Secondary | ICD-10-CM | POA: Diagnosis not present

## 2018-01-21 DIAGNOSIS — E1122 Type 2 diabetes mellitus with diabetic chronic kidney disease: Secondary | ICD-10-CM | POA: Diagnosis not present

## 2018-01-21 DIAGNOSIS — G40909 Epilepsy, unspecified, not intractable, without status epilepticus: Secondary | ICD-10-CM | POA: Diagnosis not present

## 2018-01-21 DIAGNOSIS — G20 Parkinson's disease: Secondary | ICD-10-CM | POA: Diagnosis not present

## 2018-01-24 DIAGNOSIS — G40909 Epilepsy, unspecified, not intractable, without status epilepticus: Secondary | ICD-10-CM | POA: Diagnosis not present

## 2018-01-24 DIAGNOSIS — I129 Hypertensive chronic kidney disease with stage 1 through stage 4 chronic kidney disease, or unspecified chronic kidney disease: Secondary | ICD-10-CM | POA: Diagnosis not present

## 2018-01-24 DIAGNOSIS — N183 Chronic kidney disease, stage 3 (moderate): Secondary | ICD-10-CM | POA: Diagnosis not present

## 2018-01-24 DIAGNOSIS — I69351 Hemiplegia and hemiparesis following cerebral infarction affecting right dominant side: Secondary | ICD-10-CM | POA: Diagnosis not present

## 2018-01-24 DIAGNOSIS — I872 Venous insufficiency (chronic) (peripheral): Secondary | ICD-10-CM | POA: Diagnosis not present

## 2018-01-24 DIAGNOSIS — M1990 Unspecified osteoarthritis, unspecified site: Secondary | ICD-10-CM | POA: Diagnosis not present

## 2018-01-24 DIAGNOSIS — Z48812 Encounter for surgical aftercare following surgery on the circulatory system: Secondary | ICD-10-CM | POA: Diagnosis not present

## 2018-01-24 DIAGNOSIS — E1122 Type 2 diabetes mellitus with diabetic chronic kidney disease: Secondary | ICD-10-CM | POA: Diagnosis not present

## 2018-01-24 DIAGNOSIS — G20 Parkinson's disease: Secondary | ICD-10-CM | POA: Diagnosis not present

## 2018-01-26 DIAGNOSIS — I69351 Hemiplegia and hemiparesis following cerebral infarction affecting right dominant side: Secondary | ICD-10-CM | POA: Diagnosis not present

## 2018-01-26 DIAGNOSIS — Z48812 Encounter for surgical aftercare following surgery on the circulatory system: Secondary | ICD-10-CM | POA: Diagnosis not present

## 2018-01-26 DIAGNOSIS — E1122 Type 2 diabetes mellitus with diabetic chronic kidney disease: Secondary | ICD-10-CM | POA: Diagnosis not present

## 2018-01-26 DIAGNOSIS — I129 Hypertensive chronic kidney disease with stage 1 through stage 4 chronic kidney disease, or unspecified chronic kidney disease: Secondary | ICD-10-CM | POA: Diagnosis not present

## 2018-01-26 DIAGNOSIS — M1990 Unspecified osteoarthritis, unspecified site: Secondary | ICD-10-CM | POA: Diagnosis not present

## 2018-01-26 DIAGNOSIS — I872 Venous insufficiency (chronic) (peripheral): Secondary | ICD-10-CM | POA: Diagnosis not present

## 2018-01-26 DIAGNOSIS — G40909 Epilepsy, unspecified, not intractable, without status epilepticus: Secondary | ICD-10-CM | POA: Diagnosis not present

## 2018-01-26 DIAGNOSIS — G20 Parkinson's disease: Secondary | ICD-10-CM | POA: Diagnosis not present

## 2018-01-26 DIAGNOSIS — N183 Chronic kidney disease, stage 3 (moderate): Secondary | ICD-10-CM | POA: Diagnosis not present

## 2018-01-27 DIAGNOSIS — G40909 Epilepsy, unspecified, not intractable, without status epilepticus: Secondary | ICD-10-CM | POA: Diagnosis not present

## 2018-01-27 DIAGNOSIS — Z96641 Presence of right artificial hip joint: Secondary | ICD-10-CM

## 2018-01-27 DIAGNOSIS — M1990 Unspecified osteoarthritis, unspecified site: Secondary | ICD-10-CM | POA: Diagnosis not present

## 2018-01-27 DIAGNOSIS — N400 Enlarged prostate without lower urinary tract symptoms: Secondary | ICD-10-CM

## 2018-01-27 DIAGNOSIS — G20 Parkinson's disease: Secondary | ICD-10-CM | POA: Diagnosis not present

## 2018-01-27 DIAGNOSIS — Z7984 Long term (current) use of oral hypoglycemic drugs: Secondary | ICD-10-CM

## 2018-01-27 DIAGNOSIS — I872 Venous insufficiency (chronic) (peripheral): Secondary | ICD-10-CM | POA: Diagnosis not present

## 2018-01-27 DIAGNOSIS — N183 Chronic kidney disease, stage 3 (moderate): Secondary | ICD-10-CM | POA: Diagnosis not present

## 2018-01-27 DIAGNOSIS — E1122 Type 2 diabetes mellitus with diabetic chronic kidney disease: Secondary | ICD-10-CM

## 2018-01-27 DIAGNOSIS — E785 Hyperlipidemia, unspecified: Secondary | ICD-10-CM

## 2018-01-27 DIAGNOSIS — Z87440 Personal history of urinary (tract) infections: Secondary | ICD-10-CM

## 2018-01-27 DIAGNOSIS — I129 Hypertensive chronic kidney disease with stage 1 through stage 4 chronic kidney disease, or unspecified chronic kidney disease: Secondary | ICD-10-CM | POA: Diagnosis not present

## 2018-01-27 DIAGNOSIS — Z48812 Encounter for surgical aftercare following surgery on the circulatory system: Secondary | ICD-10-CM | POA: Diagnosis not present

## 2018-01-27 DIAGNOSIS — Z9181 History of falling: Secondary | ICD-10-CM

## 2018-01-27 DIAGNOSIS — I69351 Hemiplegia and hemiparesis following cerebral infarction affecting right dominant side: Secondary | ICD-10-CM | POA: Diagnosis not present

## 2018-01-27 DIAGNOSIS — Z7902 Long term (current) use of antithrombotics/antiplatelets: Secondary | ICD-10-CM

## 2018-01-28 ENCOUNTER — Encounter: Payer: Medicare HMO | Admitting: Physical Medicine & Rehabilitation

## 2018-01-28 DIAGNOSIS — Z0000 Encounter for general adult medical examination without abnormal findings: Secondary | ICD-10-CM | POA: Diagnosis not present

## 2018-01-28 DIAGNOSIS — G40909 Epilepsy, unspecified, not intractable, without status epilepticus: Secondary | ICD-10-CM | POA: Diagnosis not present

## 2018-01-28 DIAGNOSIS — B372 Candidiasis of skin and nail: Secondary | ICD-10-CM | POA: Diagnosis not present

## 2018-01-28 DIAGNOSIS — N183 Chronic kidney disease, stage 3 (moderate): Secondary | ICD-10-CM | POA: Diagnosis not present

## 2018-01-28 DIAGNOSIS — E119 Type 2 diabetes mellitus without complications: Secondary | ICD-10-CM | POA: Diagnosis not present

## 2018-01-28 DIAGNOSIS — E782 Mixed hyperlipidemia: Secondary | ICD-10-CM | POA: Diagnosis not present

## 2018-01-28 DIAGNOSIS — I639 Cerebral infarction, unspecified: Secondary | ICD-10-CM | POA: Diagnosis not present

## 2018-01-28 DIAGNOSIS — N400 Enlarged prostate without lower urinary tract symptoms: Secondary | ICD-10-CM | POA: Diagnosis not present

## 2018-01-31 DIAGNOSIS — N183 Chronic kidney disease, stage 3 (moderate): Secondary | ICD-10-CM | POA: Diagnosis not present

## 2018-01-31 DIAGNOSIS — Z48812 Encounter for surgical aftercare following surgery on the circulatory system: Secondary | ICD-10-CM | POA: Diagnosis not present

## 2018-01-31 DIAGNOSIS — G40909 Epilepsy, unspecified, not intractable, without status epilepticus: Secondary | ICD-10-CM | POA: Diagnosis not present

## 2018-01-31 DIAGNOSIS — G20 Parkinson's disease: Secondary | ICD-10-CM | POA: Diagnosis not present

## 2018-01-31 DIAGNOSIS — I872 Venous insufficiency (chronic) (peripheral): Secondary | ICD-10-CM | POA: Diagnosis not present

## 2018-01-31 DIAGNOSIS — E1122 Type 2 diabetes mellitus with diabetic chronic kidney disease: Secondary | ICD-10-CM | POA: Diagnosis not present

## 2018-01-31 DIAGNOSIS — M1990 Unspecified osteoarthritis, unspecified site: Secondary | ICD-10-CM | POA: Diagnosis not present

## 2018-01-31 DIAGNOSIS — I69351 Hemiplegia and hemiparesis following cerebral infarction affecting right dominant side: Secondary | ICD-10-CM | POA: Diagnosis not present

## 2018-01-31 DIAGNOSIS — I129 Hypertensive chronic kidney disease with stage 1 through stage 4 chronic kidney disease, or unspecified chronic kidney disease: Secondary | ICD-10-CM | POA: Diagnosis not present

## 2018-02-02 DIAGNOSIS — G40909 Epilepsy, unspecified, not intractable, without status epilepticus: Secondary | ICD-10-CM | POA: Diagnosis not present

## 2018-02-02 DIAGNOSIS — Z48812 Encounter for surgical aftercare following surgery on the circulatory system: Secondary | ICD-10-CM | POA: Diagnosis not present

## 2018-02-02 DIAGNOSIS — I129 Hypertensive chronic kidney disease with stage 1 through stage 4 chronic kidney disease, or unspecified chronic kidney disease: Secondary | ICD-10-CM | POA: Diagnosis not present

## 2018-02-02 DIAGNOSIS — N183 Chronic kidney disease, stage 3 (moderate): Secondary | ICD-10-CM | POA: Diagnosis not present

## 2018-02-02 DIAGNOSIS — G20 Parkinson's disease: Secondary | ICD-10-CM | POA: Diagnosis not present

## 2018-02-02 DIAGNOSIS — I872 Venous insufficiency (chronic) (peripheral): Secondary | ICD-10-CM | POA: Diagnosis not present

## 2018-02-02 DIAGNOSIS — M1990 Unspecified osteoarthritis, unspecified site: Secondary | ICD-10-CM | POA: Diagnosis not present

## 2018-02-02 DIAGNOSIS — E1122 Type 2 diabetes mellitus with diabetic chronic kidney disease: Secondary | ICD-10-CM | POA: Diagnosis not present

## 2018-02-02 DIAGNOSIS — I69351 Hemiplegia and hemiparesis following cerebral infarction affecting right dominant side: Secondary | ICD-10-CM | POA: Diagnosis not present

## 2018-02-03 DIAGNOSIS — E1122 Type 2 diabetes mellitus with diabetic chronic kidney disease: Secondary | ICD-10-CM | POA: Diagnosis not present

## 2018-02-03 DIAGNOSIS — I69351 Hemiplegia and hemiparesis following cerebral infarction affecting right dominant side: Secondary | ICD-10-CM | POA: Diagnosis not present

## 2018-02-03 DIAGNOSIS — M1990 Unspecified osteoarthritis, unspecified site: Secondary | ICD-10-CM | POA: Diagnosis not present

## 2018-02-03 DIAGNOSIS — I129 Hypertensive chronic kidney disease with stage 1 through stage 4 chronic kidney disease, or unspecified chronic kidney disease: Secondary | ICD-10-CM | POA: Diagnosis not present

## 2018-02-03 DIAGNOSIS — G20 Parkinson's disease: Secondary | ICD-10-CM | POA: Diagnosis not present

## 2018-02-03 DIAGNOSIS — I872 Venous insufficiency (chronic) (peripheral): Secondary | ICD-10-CM | POA: Diagnosis not present

## 2018-02-03 DIAGNOSIS — G40909 Epilepsy, unspecified, not intractable, without status epilepticus: Secondary | ICD-10-CM | POA: Diagnosis not present

## 2018-02-03 DIAGNOSIS — Z48812 Encounter for surgical aftercare following surgery on the circulatory system: Secondary | ICD-10-CM | POA: Diagnosis not present

## 2018-02-03 DIAGNOSIS — N183 Chronic kidney disease, stage 3 (moderate): Secondary | ICD-10-CM | POA: Diagnosis not present

## 2018-02-04 ENCOUNTER — Telehealth: Payer: Self-pay | Admitting: *Deleted

## 2018-02-04 NOTE — Telephone Encounter (Signed)
[facility] calling for OT orders 1wk1, 2wk2, 1wk2.  Approval given.

## 2018-02-07 ENCOUNTER — Telehealth: Payer: Self-pay | Admitting: *Deleted

## 2018-02-07 DIAGNOSIS — Z48812 Encounter for surgical aftercare following surgery on the circulatory system: Secondary | ICD-10-CM | POA: Diagnosis not present

## 2018-02-07 DIAGNOSIS — G40909 Epilepsy, unspecified, not intractable, without status epilepticus: Secondary | ICD-10-CM | POA: Diagnosis not present

## 2018-02-07 DIAGNOSIS — N183 Chronic kidney disease, stage 3 (moderate): Secondary | ICD-10-CM | POA: Diagnosis not present

## 2018-02-07 DIAGNOSIS — I69351 Hemiplegia and hemiparesis following cerebral infarction affecting right dominant side: Secondary | ICD-10-CM | POA: Diagnosis not present

## 2018-02-07 DIAGNOSIS — I129 Hypertensive chronic kidney disease with stage 1 through stage 4 chronic kidney disease, or unspecified chronic kidney disease: Secondary | ICD-10-CM | POA: Diagnosis not present

## 2018-02-07 DIAGNOSIS — E1122 Type 2 diabetes mellitus with diabetic chronic kidney disease: Secondary | ICD-10-CM | POA: Diagnosis not present

## 2018-02-07 DIAGNOSIS — M1990 Unspecified osteoarthritis, unspecified site: Secondary | ICD-10-CM | POA: Diagnosis not present

## 2018-02-07 DIAGNOSIS — G20 Parkinson's disease: Secondary | ICD-10-CM | POA: Diagnosis not present

## 2018-02-07 DIAGNOSIS — I872 Venous insufficiency (chronic) (peripheral): Secondary | ICD-10-CM | POA: Diagnosis not present

## 2018-02-07 NOTE — Telephone Encounter (Signed)
[facility] asking for 1wk1, 2wk3, 1wk1.  Approval given.

## 2018-02-09 DIAGNOSIS — Z48812 Encounter for surgical aftercare following surgery on the circulatory system: Secondary | ICD-10-CM | POA: Diagnosis not present

## 2018-02-09 DIAGNOSIS — I872 Venous insufficiency (chronic) (peripheral): Secondary | ICD-10-CM | POA: Diagnosis not present

## 2018-02-09 DIAGNOSIS — N183 Chronic kidney disease, stage 3 (moderate): Secondary | ICD-10-CM | POA: Diagnosis not present

## 2018-02-09 DIAGNOSIS — I129 Hypertensive chronic kidney disease with stage 1 through stage 4 chronic kidney disease, or unspecified chronic kidney disease: Secondary | ICD-10-CM | POA: Diagnosis not present

## 2018-02-09 DIAGNOSIS — G20 Parkinson's disease: Secondary | ICD-10-CM | POA: Diagnosis not present

## 2018-02-09 DIAGNOSIS — M1990 Unspecified osteoarthritis, unspecified site: Secondary | ICD-10-CM | POA: Diagnosis not present

## 2018-02-09 DIAGNOSIS — G40909 Epilepsy, unspecified, not intractable, without status epilepticus: Secondary | ICD-10-CM | POA: Diagnosis not present

## 2018-02-09 DIAGNOSIS — E1122 Type 2 diabetes mellitus with diabetic chronic kidney disease: Secondary | ICD-10-CM | POA: Diagnosis not present

## 2018-02-09 DIAGNOSIS — I69351 Hemiplegia and hemiparesis following cerebral infarction affecting right dominant side: Secondary | ICD-10-CM | POA: Diagnosis not present

## 2018-02-14 DIAGNOSIS — Z48812 Encounter for surgical aftercare following surgery on the circulatory system: Secondary | ICD-10-CM | POA: Diagnosis not present

## 2018-02-14 DIAGNOSIS — I872 Venous insufficiency (chronic) (peripheral): Secondary | ICD-10-CM | POA: Diagnosis not present

## 2018-02-14 DIAGNOSIS — I129 Hypertensive chronic kidney disease with stage 1 through stage 4 chronic kidney disease, or unspecified chronic kidney disease: Secondary | ICD-10-CM | POA: Diagnosis not present

## 2018-02-14 DIAGNOSIS — I69351 Hemiplegia and hemiparesis following cerebral infarction affecting right dominant side: Secondary | ICD-10-CM | POA: Diagnosis not present

## 2018-02-14 DIAGNOSIS — M1990 Unspecified osteoarthritis, unspecified site: Secondary | ICD-10-CM | POA: Diagnosis not present

## 2018-02-14 DIAGNOSIS — G40909 Epilepsy, unspecified, not intractable, without status epilepticus: Secondary | ICD-10-CM | POA: Diagnosis not present

## 2018-02-14 DIAGNOSIS — N183 Chronic kidney disease, stage 3 (moderate): Secondary | ICD-10-CM | POA: Diagnosis not present

## 2018-02-14 DIAGNOSIS — G20 Parkinson's disease: Secondary | ICD-10-CM | POA: Diagnosis not present

## 2018-02-14 DIAGNOSIS — E1122 Type 2 diabetes mellitus with diabetic chronic kidney disease: Secondary | ICD-10-CM | POA: Diagnosis not present

## 2018-02-15 DIAGNOSIS — M1990 Unspecified osteoarthritis, unspecified site: Secondary | ICD-10-CM | POA: Diagnosis not present

## 2018-02-15 DIAGNOSIS — E1122 Type 2 diabetes mellitus with diabetic chronic kidney disease: Secondary | ICD-10-CM | POA: Diagnosis not present

## 2018-02-15 DIAGNOSIS — I69351 Hemiplegia and hemiparesis following cerebral infarction affecting right dominant side: Secondary | ICD-10-CM | POA: Diagnosis not present

## 2018-02-15 DIAGNOSIS — G20 Parkinson's disease: Secondary | ICD-10-CM | POA: Diagnosis not present

## 2018-02-15 DIAGNOSIS — N183 Chronic kidney disease, stage 3 (moderate): Secondary | ICD-10-CM | POA: Diagnosis not present

## 2018-02-15 DIAGNOSIS — I872 Venous insufficiency (chronic) (peripheral): Secondary | ICD-10-CM | POA: Diagnosis not present

## 2018-02-15 DIAGNOSIS — G40909 Epilepsy, unspecified, not intractable, without status epilepticus: Secondary | ICD-10-CM | POA: Diagnosis not present

## 2018-02-15 DIAGNOSIS — Z48812 Encounter for surgical aftercare following surgery on the circulatory system: Secondary | ICD-10-CM | POA: Diagnosis not present

## 2018-02-15 DIAGNOSIS — I129 Hypertensive chronic kidney disease with stage 1 through stage 4 chronic kidney disease, or unspecified chronic kidney disease: Secondary | ICD-10-CM | POA: Diagnosis not present

## 2018-02-16 DIAGNOSIS — I872 Venous insufficiency (chronic) (peripheral): Secondary | ICD-10-CM | POA: Diagnosis not present

## 2018-02-16 DIAGNOSIS — E1122 Type 2 diabetes mellitus with diabetic chronic kidney disease: Secondary | ICD-10-CM | POA: Diagnosis not present

## 2018-02-16 DIAGNOSIS — G40909 Epilepsy, unspecified, not intractable, without status epilepticus: Secondary | ICD-10-CM | POA: Diagnosis not present

## 2018-02-16 DIAGNOSIS — M1990 Unspecified osteoarthritis, unspecified site: Secondary | ICD-10-CM | POA: Diagnosis not present

## 2018-02-16 DIAGNOSIS — I69351 Hemiplegia and hemiparesis following cerebral infarction affecting right dominant side: Secondary | ICD-10-CM | POA: Diagnosis not present

## 2018-02-16 DIAGNOSIS — Z48812 Encounter for surgical aftercare following surgery on the circulatory system: Secondary | ICD-10-CM | POA: Diagnosis not present

## 2018-02-16 DIAGNOSIS — I129 Hypertensive chronic kidney disease with stage 1 through stage 4 chronic kidney disease, or unspecified chronic kidney disease: Secondary | ICD-10-CM | POA: Diagnosis not present

## 2018-02-16 DIAGNOSIS — G20 Parkinson's disease: Secondary | ICD-10-CM | POA: Diagnosis not present

## 2018-02-16 DIAGNOSIS — N183 Chronic kidney disease, stage 3 (moderate): Secondary | ICD-10-CM | POA: Diagnosis not present

## 2018-02-17 DIAGNOSIS — I69351 Hemiplegia and hemiparesis following cerebral infarction affecting right dominant side: Secondary | ICD-10-CM | POA: Diagnosis not present

## 2018-02-17 DIAGNOSIS — Z48812 Encounter for surgical aftercare following surgery on the circulatory system: Secondary | ICD-10-CM | POA: Diagnosis not present

## 2018-02-17 DIAGNOSIS — N183 Chronic kidney disease, stage 3 (moderate): Secondary | ICD-10-CM | POA: Diagnosis not present

## 2018-02-17 DIAGNOSIS — G20 Parkinson's disease: Secondary | ICD-10-CM | POA: Diagnosis not present

## 2018-02-17 DIAGNOSIS — I129 Hypertensive chronic kidney disease with stage 1 through stage 4 chronic kidney disease, or unspecified chronic kidney disease: Secondary | ICD-10-CM | POA: Diagnosis not present

## 2018-02-17 DIAGNOSIS — I872 Venous insufficiency (chronic) (peripheral): Secondary | ICD-10-CM | POA: Diagnosis not present

## 2018-02-17 DIAGNOSIS — E1122 Type 2 diabetes mellitus with diabetic chronic kidney disease: Secondary | ICD-10-CM | POA: Diagnosis not present

## 2018-02-17 DIAGNOSIS — M1990 Unspecified osteoarthritis, unspecified site: Secondary | ICD-10-CM | POA: Diagnosis not present

## 2018-02-17 DIAGNOSIS — G40909 Epilepsy, unspecified, not intractable, without status epilepticus: Secondary | ICD-10-CM | POA: Diagnosis not present

## 2018-02-18 DIAGNOSIS — B351 Tinea unguium: Secondary | ICD-10-CM | POA: Diagnosis not present

## 2018-02-18 DIAGNOSIS — M2041 Other hammer toe(s) (acquired), right foot: Secondary | ICD-10-CM | POA: Diagnosis not present

## 2018-02-18 DIAGNOSIS — E0840 Diabetes mellitus due to underlying condition with diabetic neuropathy, unspecified: Secondary | ICD-10-CM | POA: Diagnosis not present

## 2018-02-18 DIAGNOSIS — L84 Corns and callosities: Secondary | ICD-10-CM | POA: Diagnosis not present

## 2018-02-21 DIAGNOSIS — G20 Parkinson's disease: Secondary | ICD-10-CM | POA: Diagnosis not present

## 2018-02-21 DIAGNOSIS — I129 Hypertensive chronic kidney disease with stage 1 through stage 4 chronic kidney disease, or unspecified chronic kidney disease: Secondary | ICD-10-CM | POA: Diagnosis not present

## 2018-02-21 DIAGNOSIS — I69351 Hemiplegia and hemiparesis following cerebral infarction affecting right dominant side: Secondary | ICD-10-CM | POA: Diagnosis not present

## 2018-02-21 DIAGNOSIS — I872 Venous insufficiency (chronic) (peripheral): Secondary | ICD-10-CM | POA: Diagnosis not present

## 2018-02-21 DIAGNOSIS — Z48812 Encounter for surgical aftercare following surgery on the circulatory system: Secondary | ICD-10-CM | POA: Diagnosis not present

## 2018-02-21 DIAGNOSIS — N183 Chronic kidney disease, stage 3 (moderate): Secondary | ICD-10-CM | POA: Diagnosis not present

## 2018-02-21 DIAGNOSIS — M1990 Unspecified osteoarthritis, unspecified site: Secondary | ICD-10-CM | POA: Diagnosis not present

## 2018-02-21 DIAGNOSIS — G40909 Epilepsy, unspecified, not intractable, without status epilepticus: Secondary | ICD-10-CM | POA: Diagnosis not present

## 2018-02-21 DIAGNOSIS — E1122 Type 2 diabetes mellitus with diabetic chronic kidney disease: Secondary | ICD-10-CM | POA: Diagnosis not present

## 2018-02-22 DIAGNOSIS — I129 Hypertensive chronic kidney disease with stage 1 through stage 4 chronic kidney disease, or unspecified chronic kidney disease: Secondary | ICD-10-CM | POA: Diagnosis not present

## 2018-02-22 DIAGNOSIS — E1122 Type 2 diabetes mellitus with diabetic chronic kidney disease: Secondary | ICD-10-CM | POA: Diagnosis not present

## 2018-02-22 DIAGNOSIS — M1990 Unspecified osteoarthritis, unspecified site: Secondary | ICD-10-CM | POA: Diagnosis not present

## 2018-02-22 DIAGNOSIS — G20 Parkinson's disease: Secondary | ICD-10-CM | POA: Diagnosis not present

## 2018-02-22 DIAGNOSIS — G40909 Epilepsy, unspecified, not intractable, without status epilepticus: Secondary | ICD-10-CM | POA: Diagnosis not present

## 2018-02-22 DIAGNOSIS — Z48812 Encounter for surgical aftercare following surgery on the circulatory system: Secondary | ICD-10-CM | POA: Diagnosis not present

## 2018-02-22 DIAGNOSIS — N183 Chronic kidney disease, stage 3 (moderate): Secondary | ICD-10-CM | POA: Diagnosis not present

## 2018-02-22 DIAGNOSIS — I69351 Hemiplegia and hemiparesis following cerebral infarction affecting right dominant side: Secondary | ICD-10-CM | POA: Diagnosis not present

## 2018-02-22 DIAGNOSIS — I872 Venous insufficiency (chronic) (peripheral): Secondary | ICD-10-CM | POA: Diagnosis not present

## 2018-02-23 DIAGNOSIS — E1122 Type 2 diabetes mellitus with diabetic chronic kidney disease: Secondary | ICD-10-CM | POA: Diagnosis not present

## 2018-02-23 DIAGNOSIS — Z48812 Encounter for surgical aftercare following surgery on the circulatory system: Secondary | ICD-10-CM | POA: Diagnosis not present

## 2018-02-23 DIAGNOSIS — G20 Parkinson's disease: Secondary | ICD-10-CM | POA: Diagnosis not present

## 2018-02-23 DIAGNOSIS — N183 Chronic kidney disease, stage 3 (moderate): Secondary | ICD-10-CM | POA: Diagnosis not present

## 2018-02-23 DIAGNOSIS — I69351 Hemiplegia and hemiparesis following cerebral infarction affecting right dominant side: Secondary | ICD-10-CM | POA: Diagnosis not present

## 2018-02-23 DIAGNOSIS — I872 Venous insufficiency (chronic) (peripheral): Secondary | ICD-10-CM | POA: Diagnosis not present

## 2018-02-23 DIAGNOSIS — I129 Hypertensive chronic kidney disease with stage 1 through stage 4 chronic kidney disease, or unspecified chronic kidney disease: Secondary | ICD-10-CM | POA: Diagnosis not present

## 2018-02-23 DIAGNOSIS — G40909 Epilepsy, unspecified, not intractable, without status epilepticus: Secondary | ICD-10-CM | POA: Diagnosis not present

## 2018-02-23 DIAGNOSIS — M1990 Unspecified osteoarthritis, unspecified site: Secondary | ICD-10-CM | POA: Diagnosis not present

## 2018-02-26 DIAGNOSIS — G40909 Epilepsy, unspecified, not intractable, without status epilepticus: Secondary | ICD-10-CM | POA: Diagnosis not present

## 2018-02-26 DIAGNOSIS — I6930 Unspecified sequelae of cerebral infarction: Secondary | ICD-10-CM | POA: Diagnosis not present

## 2018-02-26 DIAGNOSIS — E119 Type 2 diabetes mellitus without complications: Secondary | ICD-10-CM | POA: Diagnosis not present

## 2018-02-26 DIAGNOSIS — I519 Heart disease, unspecified: Secondary | ICD-10-CM | POA: Diagnosis not present

## 2018-02-28 DIAGNOSIS — I69351 Hemiplegia and hemiparesis following cerebral infarction affecting right dominant side: Secondary | ICD-10-CM | POA: Diagnosis not present

## 2018-02-28 DIAGNOSIS — N183 Chronic kidney disease, stage 3 (moderate): Secondary | ICD-10-CM | POA: Diagnosis not present

## 2018-02-28 DIAGNOSIS — M1990 Unspecified osteoarthritis, unspecified site: Secondary | ICD-10-CM | POA: Diagnosis not present

## 2018-02-28 DIAGNOSIS — G40909 Epilepsy, unspecified, not intractable, without status epilepticus: Secondary | ICD-10-CM | POA: Diagnosis not present

## 2018-02-28 DIAGNOSIS — I129 Hypertensive chronic kidney disease with stage 1 through stage 4 chronic kidney disease, or unspecified chronic kidney disease: Secondary | ICD-10-CM | POA: Diagnosis not present

## 2018-02-28 DIAGNOSIS — I872 Venous insufficiency (chronic) (peripheral): Secondary | ICD-10-CM | POA: Diagnosis not present

## 2018-02-28 DIAGNOSIS — Z48812 Encounter for surgical aftercare following surgery on the circulatory system: Secondary | ICD-10-CM | POA: Diagnosis not present

## 2018-02-28 DIAGNOSIS — E1122 Type 2 diabetes mellitus with diabetic chronic kidney disease: Secondary | ICD-10-CM | POA: Diagnosis not present

## 2018-02-28 DIAGNOSIS — G20 Parkinson's disease: Secondary | ICD-10-CM | POA: Diagnosis not present

## 2018-03-01 DIAGNOSIS — N183 Chronic kidney disease, stage 3 (moderate): Secondary | ICD-10-CM | POA: Diagnosis not present

## 2018-03-01 DIAGNOSIS — M1990 Unspecified osteoarthritis, unspecified site: Secondary | ICD-10-CM | POA: Diagnosis not present

## 2018-03-01 DIAGNOSIS — I872 Venous insufficiency (chronic) (peripheral): Secondary | ICD-10-CM | POA: Diagnosis not present

## 2018-03-01 DIAGNOSIS — G40909 Epilepsy, unspecified, not intractable, without status epilepticus: Secondary | ICD-10-CM | POA: Diagnosis not present

## 2018-03-01 DIAGNOSIS — I129 Hypertensive chronic kidney disease with stage 1 through stage 4 chronic kidney disease, or unspecified chronic kidney disease: Secondary | ICD-10-CM | POA: Diagnosis not present

## 2018-03-01 DIAGNOSIS — I69351 Hemiplegia and hemiparesis following cerebral infarction affecting right dominant side: Secondary | ICD-10-CM | POA: Diagnosis not present

## 2018-03-01 DIAGNOSIS — E1122 Type 2 diabetes mellitus with diabetic chronic kidney disease: Secondary | ICD-10-CM | POA: Diagnosis not present

## 2018-03-01 DIAGNOSIS — G20 Parkinson's disease: Secondary | ICD-10-CM | POA: Diagnosis not present

## 2018-03-01 DIAGNOSIS — Z48812 Encounter for surgical aftercare following surgery on the circulatory system: Secondary | ICD-10-CM | POA: Diagnosis not present

## 2018-03-04 DIAGNOSIS — I69351 Hemiplegia and hemiparesis following cerebral infarction affecting right dominant side: Secondary | ICD-10-CM | POA: Diagnosis not present

## 2018-03-04 DIAGNOSIS — G20 Parkinson's disease: Secondary | ICD-10-CM | POA: Diagnosis not present

## 2018-03-04 DIAGNOSIS — I872 Venous insufficiency (chronic) (peripheral): Secondary | ICD-10-CM | POA: Diagnosis not present

## 2018-03-04 DIAGNOSIS — M1990 Unspecified osteoarthritis, unspecified site: Secondary | ICD-10-CM | POA: Diagnosis not present

## 2018-03-04 DIAGNOSIS — E1122 Type 2 diabetes mellitus with diabetic chronic kidney disease: Secondary | ICD-10-CM | POA: Diagnosis not present

## 2018-03-04 DIAGNOSIS — I129 Hypertensive chronic kidney disease with stage 1 through stage 4 chronic kidney disease, or unspecified chronic kidney disease: Secondary | ICD-10-CM | POA: Diagnosis not present

## 2018-03-04 DIAGNOSIS — N183 Chronic kidney disease, stage 3 (moderate): Secondary | ICD-10-CM | POA: Diagnosis not present

## 2018-03-04 DIAGNOSIS — Z48812 Encounter for surgical aftercare following surgery on the circulatory system: Secondary | ICD-10-CM | POA: Diagnosis not present

## 2018-03-04 DIAGNOSIS — G40909 Epilepsy, unspecified, not intractable, without status epilepticus: Secondary | ICD-10-CM | POA: Diagnosis not present

## 2018-03-07 DIAGNOSIS — E1122 Type 2 diabetes mellitus with diabetic chronic kidney disease: Secondary | ICD-10-CM | POA: Diagnosis not present

## 2018-03-07 DIAGNOSIS — M1990 Unspecified osteoarthritis, unspecified site: Secondary | ICD-10-CM | POA: Diagnosis not present

## 2018-03-07 DIAGNOSIS — I69351 Hemiplegia and hemiparesis following cerebral infarction affecting right dominant side: Secondary | ICD-10-CM | POA: Diagnosis not present

## 2018-03-07 DIAGNOSIS — G20 Parkinson's disease: Secondary | ICD-10-CM | POA: Diagnosis not present

## 2018-03-07 DIAGNOSIS — I872 Venous insufficiency (chronic) (peripheral): Secondary | ICD-10-CM | POA: Diagnosis not present

## 2018-03-07 DIAGNOSIS — I129 Hypertensive chronic kidney disease with stage 1 through stage 4 chronic kidney disease, or unspecified chronic kidney disease: Secondary | ICD-10-CM | POA: Diagnosis not present

## 2018-03-07 DIAGNOSIS — Z48812 Encounter for surgical aftercare following surgery on the circulatory system: Secondary | ICD-10-CM | POA: Diagnosis not present

## 2018-03-07 DIAGNOSIS — G40909 Epilepsy, unspecified, not intractable, without status epilepticus: Secondary | ICD-10-CM | POA: Diagnosis not present

## 2018-03-07 DIAGNOSIS — N183 Chronic kidney disease, stage 3 (moderate): Secondary | ICD-10-CM | POA: Diagnosis not present

## 2018-05-19 DIAGNOSIS — I519 Heart disease, unspecified: Secondary | ICD-10-CM | POA: Diagnosis not present

## 2018-05-19 DIAGNOSIS — E118 Type 2 diabetes mellitus with unspecified complications: Secondary | ICD-10-CM | POA: Diagnosis not present

## 2018-05-19 DIAGNOSIS — Z7902 Long term (current) use of antithrombotics/antiplatelets: Secondary | ICD-10-CM | POA: Diagnosis not present

## 2018-05-19 DIAGNOSIS — I6930 Unspecified sequelae of cerebral infarction: Secondary | ICD-10-CM | POA: Diagnosis not present

## 2018-05-19 DIAGNOSIS — G40909 Epilepsy, unspecified, not intractable, without status epilepticus: Secondary | ICD-10-CM | POA: Diagnosis not present

## 2019-11-15 ENCOUNTER — Inpatient Hospital Stay (HOSPITAL_COMMUNITY)
Admission: EM | Admit: 2019-11-15 | Discharge: 2019-11-22 | DRG: 176 | Disposition: A | Discharge status: Skilled Nursing Facility | Payer: Medicare HMO | Attending: Internal Medicine | Admitting: Internal Medicine

## 2019-11-15 ENCOUNTER — Emergency Department (HOSPITAL_COMMUNITY): Payer: Medicare HMO

## 2019-11-15 ENCOUNTER — Other Ambulatory Visit: Payer: Self-pay

## 2019-11-15 DIAGNOSIS — I2699 Other pulmonary embolism without acute cor pulmonale: Principal | ICD-10-CM | POA: Diagnosis present

## 2019-11-15 DIAGNOSIS — Z96641 Presence of right artificial hip joint: Secondary | ICD-10-CM | POA: Diagnosis present

## 2019-11-15 DIAGNOSIS — Z803 Family history of malignant neoplasm of breast: Secondary | ICD-10-CM

## 2019-11-15 DIAGNOSIS — I10 Essential (primary) hypertension: Secondary | ICD-10-CM | POA: Diagnosis present

## 2019-11-15 DIAGNOSIS — I5032 Chronic diastolic (congestive) heart failure: Secondary | ICD-10-CM | POA: Diagnosis present

## 2019-11-15 DIAGNOSIS — Z7984 Long term (current) use of oral hypoglycemic drugs: Secondary | ICD-10-CM

## 2019-11-15 DIAGNOSIS — Q7022 Fused toes, left foot: Secondary | ICD-10-CM

## 2019-11-15 DIAGNOSIS — I130 Hypertensive heart and chronic kidney disease with heart failure and stage 1 through stage 4 chronic kidney disease, or unspecified chronic kidney disease: Secondary | ICD-10-CM | POA: Diagnosis present

## 2019-11-15 DIAGNOSIS — B9620 Unspecified Escherichia coli [E. coli] as the cause of diseases classified elsewhere: Secondary | ICD-10-CM | POA: Diagnosis present

## 2019-11-15 DIAGNOSIS — R0902 Hypoxemia: Secondary | ICD-10-CM | POA: Diagnosis present

## 2019-11-15 DIAGNOSIS — Z6841 Body Mass Index (BMI) 40.0 and over, adult: Secondary | ICD-10-CM

## 2019-11-15 DIAGNOSIS — Z20822 Contact with and (suspected) exposure to covid-19: Secondary | ICD-10-CM | POA: Diagnosis present

## 2019-11-15 DIAGNOSIS — N179 Acute kidney failure, unspecified: Secondary | ICD-10-CM | POA: Diagnosis present

## 2019-11-15 DIAGNOSIS — Z87442 Personal history of urinary calculi: Secondary | ICD-10-CM

## 2019-11-15 DIAGNOSIS — I69351 Hemiplegia and hemiparesis following cerebral infarction affecting right dominant side: Secondary | ICD-10-CM

## 2019-11-15 DIAGNOSIS — B351 Tinea unguium: Secondary | ICD-10-CM

## 2019-11-15 DIAGNOSIS — E1165 Type 2 diabetes mellitus with hyperglycemia: Secondary | ICD-10-CM | POA: Diagnosis present

## 2019-11-15 DIAGNOSIS — G40909 Epilepsy, unspecified, not intractable, without status epilepticus: Secondary | ICD-10-CM | POA: Diagnosis present

## 2019-11-15 DIAGNOSIS — N3001 Acute cystitis with hematuria: Secondary | ICD-10-CM

## 2019-11-15 DIAGNOSIS — N400 Enlarged prostate without lower urinary tract symptoms: Secondary | ICD-10-CM | POA: Diagnosis present

## 2019-11-15 DIAGNOSIS — E785 Hyperlipidemia, unspecified: Secondary | ICD-10-CM | POA: Diagnosis present

## 2019-11-15 DIAGNOSIS — Z820 Family history of epilepsy and other diseases of the nervous system: Secondary | ICD-10-CM

## 2019-11-15 DIAGNOSIS — N183 Chronic kidney disease, stage 3 unspecified: Secondary | ICD-10-CM | POA: Diagnosis present

## 2019-11-15 DIAGNOSIS — E119 Type 2 diabetes mellitus without complications: Secondary | ICD-10-CM

## 2019-11-15 DIAGNOSIS — I82402 Acute embolism and thrombosis of unspecified deep veins of left lower extremity: Secondary | ICD-10-CM | POA: Diagnosis present

## 2019-11-15 DIAGNOSIS — Z23 Encounter for immunization: Secondary | ICD-10-CM

## 2019-11-15 DIAGNOSIS — Z79899 Other long term (current) drug therapy: Secondary | ICD-10-CM

## 2019-11-15 DIAGNOSIS — Z7982 Long term (current) use of aspirin: Secondary | ICD-10-CM

## 2019-11-15 DIAGNOSIS — I452 Bifascicular block: Secondary | ICD-10-CM | POA: Diagnosis present

## 2019-11-15 DIAGNOSIS — Z7902 Long term (current) use of antithrombotics/antiplatelets: Secondary | ICD-10-CM

## 2019-11-15 DIAGNOSIS — R531 Weakness: Secondary | ICD-10-CM

## 2019-11-15 DIAGNOSIS — K219 Gastro-esophageal reflux disease without esophagitis: Secondary | ICD-10-CM | POA: Diagnosis present

## 2019-11-15 DIAGNOSIS — E1122 Type 2 diabetes mellitus with diabetic chronic kidney disease: Secondary | ICD-10-CM | POA: Diagnosis present

## 2019-11-15 DIAGNOSIS — R062 Wheezing: Secondary | ICD-10-CM | POA: Diagnosis present

## 2019-11-15 DIAGNOSIS — N390 Urinary tract infection, site not specified: Secondary | ICD-10-CM | POA: Diagnosis present

## 2019-11-15 DIAGNOSIS — Z87440 Personal history of urinary (tract) infections: Secondary | ICD-10-CM

## 2019-11-15 DIAGNOSIS — N3091 Cystitis, unspecified with hematuria: Secondary | ICD-10-CM | POA: Diagnosis present

## 2019-11-15 DIAGNOSIS — I2693 Single subsegmental pulmonary embolism without acute cor pulmonale: Secondary | ICD-10-CM

## 2019-11-15 LAB — CBC WITH DIFFERENTIAL/PLATELET
Abs Immature Granulocytes: 0.04 10*3/uL (ref 0.00–0.07)
Basophils Absolute: 0.1 10*3/uL (ref 0.0–0.1)
Basophils Relative: 1 %
Eosinophils Absolute: 0.2 10*3/uL (ref 0.0–0.5)
Eosinophils Relative: 2 %
HCT: 50.2 % (ref 39.0–52.0)
Hemoglobin: 16 g/dL (ref 13.0–17.0)
Immature Granulocytes: 0 %
Lymphocytes Relative: 9 %
Lymphs Abs: 1 10*3/uL (ref 0.7–4.0)
MCH: 31.1 pg (ref 26.0–34.0)
MCHC: 31.9 g/dL (ref 30.0–36.0)
MCV: 97.5 fL (ref 80.0–100.0)
Monocytes Absolute: 0.8 10*3/uL (ref 0.1–1.0)
Monocytes Relative: 8 %
Neutro Abs: 8.8 10*3/uL — ABNORMAL HIGH (ref 1.7–7.7)
Neutrophils Relative %: 80 %
Platelets: 182 10*3/uL (ref 150–400)
RBC: 5.15 MIL/uL (ref 4.22–5.81)
RDW: 12.5 % (ref 11.5–15.5)
WBC: 10.9 10*3/uL — ABNORMAL HIGH (ref 4.0–10.5)
nRBC: 0 % (ref 0.0–0.2)

## 2019-11-15 LAB — COMPREHENSIVE METABOLIC PANEL
ALT: 14 U/L (ref 0–44)
AST: 14 U/L — ABNORMAL LOW (ref 15–41)
Albumin: 3.3 g/dL — ABNORMAL LOW (ref 3.5–5.0)
Alkaline Phosphatase: 115 U/L (ref 38–126)
Anion gap: 11 (ref 5–15)
BUN: 31 mg/dL — ABNORMAL HIGH (ref 8–23)
CO2: 24 mmol/L (ref 22–32)
Calcium: 9 mg/dL (ref 8.9–10.3)
Chloride: 102 mmol/L (ref 98–111)
Creatinine, Ser: 1.95 mg/dL — ABNORMAL HIGH (ref 0.61–1.24)
GFR calc Af Amer: 36 mL/min — ABNORMAL LOW (ref >60)
GFR calc non Af Amer: 31 mL/min — ABNORMAL LOW (ref >60)
Glucose, Bld: 384 mg/dL — ABNORMAL HIGH (ref 70–99)
Potassium: 4.6 mmol/L (ref 3.5–5.1)
Sodium: 137 mmol/L (ref 135–145)
Total Bilirubin: 0.6 mg/dL (ref 0.3–1.2)
Total Protein: 6.5 g/dL (ref 6.5–8.1)

## 2019-11-15 LAB — CBG MONITORING, ED: Glucose-Capillary: 358 mg/dL — ABNORMAL HIGH (ref 70–99)

## 2019-11-15 MED ORDER — SODIUM CHLORIDE 0.9 % IV SOLN
2.0000 g | Freq: Once | INTRAVENOUS | Status: AC
  Administered 2019-11-16: 2 g via INTRAVENOUS
  Filled 2019-11-15: qty 20

## 2019-11-15 MED ORDER — LACTATED RINGERS IV BOLUS (SEPSIS)
1000.0000 mL | Freq: Once | INTRAVENOUS | Status: AC
  Administered 2019-11-16: 1000 mL via INTRAVENOUS

## 2019-11-15 MED ORDER — LACTATED RINGERS IV BOLUS
1000.0000 mL | Freq: Once | INTRAVENOUS | Status: AC
  Administered 2019-11-16: 1000 mL via INTRAVENOUS

## 2019-11-15 MED ORDER — VANCOMYCIN HCL IN DEXTROSE 1-5 GM/200ML-% IV SOLN: 1000.0000 mg | Freq: Once | INTRAVENOUS | Status: DC

## 2019-11-15 MED ORDER — LACTATED RINGERS IV SOLN: INTRAVENOUS | Status: DC

## 2019-11-15 MED ORDER — VANCOMYCIN HCL 1250 MG/250ML IV SOLN
1250.0000 mg | INTRAVENOUS | Status: DC
  Administered 2019-11-16: 1250 mg via INTRAVENOUS
  Filled 2019-11-15: qty 250

## 2019-11-15 NOTE — ED Provider Notes (Signed)
Here with generalized weakness Walks with walker but harder today Left leg more swollen, ?cellulitis Rectal 100.1 Hypoxic to 87% -not on home O2 93% on 3L Has been COVID vaccinated Is getting vanc and rocephin  Pending CTA, COVID, UA Anticipate admission  COVID negative UA positive for UTI - has already received Rocephin CTA positive for PE right upper and lower lobes.  Discussed with unassigned medicine to admit.    Charlann Lange, PA-C 11/16/19 9476    Wyvonnia Dusky, MD 11/16/19 1120

## 2019-11-15 NOTE — Progress Notes (Signed)
Pharmacy Antibiotic Note  [name] [name] is a 85 y.o. male admitted on 11/15/2019 with cellulitis.  Pharmacy has been consulted for Vancomycin dosing.  Plan: Vancomycin 1250 mg IV q 24 hrs. F/u cultures, renal function and clinical course.  Height: 5\' 8"  (172.7 cm) Weight: 106.6 kg (235 lb) IBW/kg (Calculated) : 68.4  Temp (24hrs), Avg:99.1 F (37.3 C), Min:98.1 F (36.7 C), Max:100.1 F (37.8 C)  Recent Labs  Lab 11/15/19 2222  WBC 10.9*  CREATININE 1.95*    Estimated Creatinine Clearance: 34.6 mL/min (A) (by C-G formula based on SCr of 1.95 mg/dL (H)).    No Known Allergies   Thank you for allowing pharmacy to be a part of this patient's care.  [place] Crane, Roylene Reason, BCCP Clinical Pharmacist  11/15/2019 11:22 PM   [facility] pharmacy phone numbers are listed on [email or web address]

## 2019-11-15 NOTE — ED Provider Notes (Signed)
[facility] EMERGENCY DEPARTMENT Provider Note   CSN: [id number] Arrival date & time: 11/15/19  2118     History Chief Complaint  Patient presents with  . Weakness    [name] [name] is a 85 y.o. male with a past medical history of diabetes, seizures on Keppra, arthritis, obesity, prior stroke resulting in right-sided weakness presenting to the ED with a chief complaint of generalized weakness.  States that he usually ambulates with a walker.  Today he woke up and felt generally weak to the point where he felt like he could not walk to the bathroom.  States that he usually urinates in a can when he cannot walk to the bathroom.  He is unsure exactly what is causing him to feel weak.  He denies any specific chest pain, abdominal pain, headache, nausea, vomiting, diarrhea.  States that he did urinate on himself as he was unable to walk to the bathroom today.  He has noticed that his left lower extremity has been swollen. He has been vaccinated against COVID.  HPI     Past Medical History:  Diagnosis Date  . Arthritis   . Diabetes mellitus without complication ([place])    type 2  . Prostate disease   . Renal calculi   . Renal disorder    kidney stones  . Seizures [facility])     Patient Active Problem List   Diagnosis Date Noted  . Congestion of nasal sinus   . Hyperlipidemia LDL goal <70   . Hypoalbuminemia due to protein-calorie malnutrition ([place])   . Benign prostatic hyperplasia   . Diabetes mellitus type 2 in obese ([place])   . Acute on chronic renal failure ([place])   . Stage 3 chronic kidney disease   . Morbid obesity ([place])   . Acute ischemic left middle cerebral artery (MCA) stroke ([place]) 12/30/2017  . Cerebral infarction ([place])   . Cerebral embolism with cerebral infarction 12/20/2017  . CVA (cerebral vascular accident) ([place]) 12/20/2017  . Acute ischemic stroke ([place])   . History of CVA (cerebrovascular accident)   . Parkinson disease ([place])   . Diastolic dysfunction    . Hypokalemia   . Right sided weakness 12/18/2017  . Chronic venous insufficiency 05/05/2017  . .0... 12/21/2016  . Weakness 12/21/2016  . Contracture of muscle of right upper arm 12/21/2016  . UTI (urinary tract infection) 12/21/2016  . Seizures ([place])   . Left knee pain 09/06/2015  . Hyperlipidemia associated with type 2 diabetes mellitus ([place]) 09/06/2015  . Tremor 09/06/2015  . Urinary frequency 09/06/2015  . Acute urinary retention 08/12/2015  . Hyperkalemia 08/10/2015  . Acute renal failure ([place]) 08/10/2015  . Acute bilateral obstructive uropathy 08/10/2015  . HTN (hypertension) 08/10/2015  . Diabetes mellitus, type 2 ([place]) 08/10/2015    Past Surgical History:  Procedure Laterality Date  . CYSTOSCOPY W/ URETERAL STENT PLACEMENT Bilateral 08/10/2015   Procedure: CYSTOSCOPY WITH BILATERAL RETROGRADE PYELOGRAM/BILATERAL URETERAL STENT PLACEMENT;  Surgeon: Kathie Rhodes, MD;  Location: [facility];  Service: Urology;  Laterality: Bilateral;  . CYSTOSCOPY WITH RETROGRADE PYELOGRAM, URETEROSCOPY AND STENT PLACEMENT Bilateral 09/02/2015   Procedure: CYSTOSCOPY BILATERAL STENT REMOVAL BILATERAL RETROGRADE PYELOGRAM, BILATERAL URETEROSCOPY AND BILATERAL STENT PLACEMENT;  Surgeon: Kathie Rhodes, MD;  Location: [facility];  Service: Urology;  Laterality: Bilateral;  . ENDARTERECTOMY Left 12/21/2017   Procedure: Left ENDARTERECTOMY CAROTID;  Surgeon: Angelia Mould, MD;  Location: [facility];  Service: Vascular;  Laterality: Left;  . HOLMIUM LASER APPLICATION Bilateral 0/27/7412  Procedure: BILATERAL HOLMIUM LASER  LITHO ;  Surgeon: Kathie Rhodes, MD;  Location: [facility];  Service: Urology;  Laterality: Bilateral;  . JOINT REPLACEMENT Right 2007   hip, Dr. Alvan Dame  . PATCH ANGIOPLASTY Left 12/21/2017   Procedure: PATCH ANGIOPLASTY;  Surgeon: Angelia Mould, MD;  Location: [facility] OR;  Service: Vascular;  Laterality: Left;  . TONSILLECTOMY Bilateral    70 years ago       Family History  Problem  Relation Age of Onset  . Early death Neg Hx   . Kidney disease Neg Hx     Social History   Tobacco Use  . Smoking status: Never Smoker  . Smokeless tobacco: Never Used  Vaping Use  . Vaping Use: Never used  Substance Use Topics  . Alcohol use: No  . Drug use: No    Home Medications Prior to Admission medications   Medication Sig Start Date End Date Taking? Authorizing Provider  acetaminophen (TYLENOL) 325 MG tablet Take 1-2 tablets (325-650 mg total) by mouth every 4 (four) hours as needed for mild pain. 01/17/18   Love, Ivan Anchors, PA-C  aspirin 81 MG tablet Take 81 mg by mouth daily.     [provider]  clopidogrel (PLAVIX) 75 MG tablet Take 1 tablet (75 mg total) by mouth daily. 01/17/18   Love, Ivan Anchors, PA-C  finasteride (PROSCAR) 5 MG tablet Take 5 mg by mouth daily. 11/24/17   [provider]  glimepiride (AMARYL) 1 MG tablet Take 1 tablet (1 mg total) by mouth daily with breakfast. 01/18/18   Love, Ivan Anchors, PA-C  levETIRAcetam (KEPPRA) 500 MG tablet Take 1 tablet (500 mg total) by mouth 2 (two) times daily. 08/10/16   Drenda Freeze, MD  pantoprazole (PROTONIX) 40 MG tablet Take 1 tablet (40 mg total) by mouth daily. 12/31/17   Alma Friendly, MD  senna-docusate (SENOKOT-S) 8.6-50 MG tablet Take 2 tablets by mouth at bedtime. 01/17/18   Love, Ivan Anchors, PA-C  simvastatin (ZOCOR) 40 MG tablet Take 1 tablet (40 mg total) by mouth daily. 01/17/18   Love, Ivan Anchors, PA-C  sitaGLIPtin (JANUVIA) 50 MG tablet Take 1 tablet (50 mg total) by mouth daily. 01/17/18   Love, Ivan Anchors, PA-C  tamsulosin (FLOMAX) 0.4 MG CAPS capsule Take 1 capsule (0.4 mg total) by mouth daily after supper. 01/18/18   Bary Leriche, PA-C    Allergies    Patient has no known allergies.  Review of Systems   Review of Systems  Constitutional: Positive for fatigue. Negative for appetite change, chills and fever.  HENT: Negative for ear pain, rhinorrhea, sneezing and sore throat.   Eyes:  Negative for photophobia and visual disturbance.  Respiratory: Negative for cough, chest tightness, shortness of breath and wheezing.   Cardiovascular: Positive for leg swelling. Negative for chest pain and palpitations.  Gastrointestinal: Negative for abdominal pain, blood in stool, constipation, diarrhea, nausea and vomiting.  Genitourinary: Negative for dysuria, hematuria and urgency.  Musculoskeletal: Negative for myalgias.  Skin: Negative for rash.  Neurological: Positive for weakness. Negative for dizziness and light-headedness.    Physical Exam Updated Vital Signs BP 140/80   Pulse 99   Temp 100.1 F (37.8 C) (Rectal)   Resp 18   Ht 5\' 8"  (1.727 m)   Wt 106.6 kg   SpO2 93%   BMI 35.73 kg/m   Physical Exam Vitals and nursing note reviewed.  Constitutional:      General: He is not in  acute distress.    Appearance: He is well-developed. He is obese.     Comments: 3L of oxygen via nasal cannula.  HENT:     Head: Normocephalic and atraumatic.     Nose: Nose normal.  Eyes:     General: No scleral icterus.       Right eye: No discharge.        Left eye: No discharge.     Conjunctiva/sclera: Conjunctivae normal.  Cardiovascular:     Rate and Rhythm: Normal rate and regular rhythm.     Heart sounds: Normal heart sounds. No murmur heard.  No friction rub. No gallop.   Pulmonary:     Effort: Pulmonary effort is normal. No respiratory distress.     Breath sounds: Normal breath sounds.  Abdominal:     General: Bowel sounds are normal. There is no distension.     Palpations: Abdomen is soft.     Tenderness: There is no abdominal tenderness. There is no guarding.  Musculoskeletal:        General: Normal range of motion.     Cervical back: Normal range of motion and neck supple.     Right lower leg: Edema present.     Left lower leg: Edema present.     Comments: Ankle monitor on LLE.  Feet:     Right foot:     Toenail Condition: Right toenails are abnormally thick.      Left foot:     Toenail Condition: Left toenails are abnormally thick.  Skin:    General: Skin is warm and dry.     Findings: Erythema (LLE) present. No rash.  Neurological:     Mental Status: He is alert.     Motor: No abnormal muscle tone.     Coordination: Coordination normal.     Comments: Alert, oriented x3. No facial asymmetry noted. Equal grip strength bilaterally. Moving all extremities without difficulty. Normal sensation to light touch of bilateral upper and lower extremities.     ED Results / Procedures / Treatments   Labs (all labs ordered are listed, but only abnormal results are displayed) Labs Reviewed  COMPREHENSIVE METABOLIC PANEL - Abnormal; Notable for the following components:      Result Value   Glucose, Bld 384 (*)    BUN 31 (*)    Creatinine, Ser 1.95 (*)    Albumin 3.3 (*)    AST 14 (*)    GFR calc non Af Amer 31 (*)    GFR calc Af Amer 36 (*)    All other components within normal limits  CBC WITH DIFFERENTIAL/PLATELET - Abnormal; Notable for the following components:   WBC 10.9 (*)    Neutro Abs 8.8 (*)    All other components within normal limits  CBG MONITORING, ED - Abnormal; Notable for the following components:   Glucose-Capillary 358 (*)    All other components within normal limits  CULTURE, BLOOD (ROUTINE X 2)  CULTURE, BLOOD (ROUTINE X 2)  URINE CULTURE  SARS CORONAVIRUS 2 BY RT PCR (HOSPITAL ORDER, [place] [facility])  URINALYSIS, ROUTINE W REFLEX MICROSCOPIC  LACTIC ACID, PLASMA  LACTIC ACID, PLASMA  PROTIME-INR  APTT  TROPONIN I (HIGH SENSITIVITY)    EKG EKG Interpretation  Date/Time:  Wednesday November 15 2019 22:18:54 EDT Ventricular Rate:  97 PR Interval:    QRS Duration: 171 QT Interval:  385 QTC Calculation: 490 R Axis:   -104 Text Interpretation: Sinus rhythm RBBB and  LAFB Inferior infarct, acute Lateral leads are also involved Does not meet sgarbossa criterai Confirmed by Octaviano Glow [phone]) on  11/15/2019 10:43:32 PM   Radiology DG Chest Portable 1 View  Result Date: 11/15/2019 CLINICAL DATA:  Hypoxia EXAM: PORTABLE CHEST 1 VIEW COMPARISON:  12/21/2016 FINDINGS: Mildly low lung volumes. Mild diffuse chronic appearing interstitial opacity. No consolidation or effusion. Stable cardiomediastinal silhouette with aortic atherosclerosis. No pneumothorax. IMPRESSION: No active disease. Electronically Signed   By: Donavan Foil M.D.   On: 11/15/2019 22:07    Procedures Procedures (including critical care time)  Medications Ordered in ED Medications  lactated ringers infusion (has no administration in time range)  cefTRIAXone (ROCEPHIN) 2 g in sodium chloride 0.9 % 100 mL IVPB (has no administration in time range)  lactated ringers bolus 1,000 mL (has no administration in time range)  vancomycin (VANCOREADY) IVPB 1250 mg/250 mL (has no administration in time range)  lactated ringers bolus 1,000 mL (has no administration in time range)    ED Course  I have reviewed the triage vital signs and the nursing notes.  Pertinent labs & imaging results that were available during my care of the patient were reviewed by me and considered in my medical decision making (see chart for details).    MDM Rules/Calculators/A&P                          85 year old male with past medical history of diabetes, seizures on Keppra, arthritis, obesity, prior stroke with right-sided weakness presented to the ED with a chief complaint of generalized weakness. Usually ambulates with a walker but today woke up feeling generally weak to the point where he felt he could not walk to the bathroom like he usually does. Denies specific chest pain, abdominal pain, headache, diarrhea, vomiting. He did urinate on himself today as he was unable to walk to the bathroom. Reports left lower extremity edema and redness for the past several weeks. On exam abdomen is soft, nontender nondistended. There is some erythema and edema  noted of the left lower extremity greater than right. Equal and intact distal pulses of bilateral lower extremities. Normal sensation to light touch. No facial asymmetry noted. He is alert and oriented x4. Rectal temp is 100.1. Patient hypoxic to the mid 80s on arrival and placed on 3 l of oxygen by nasal cannula. He does not wear supplemental oxygen at home. Denies any respiratory symptoms. He has been vaccinated against Covid. Will obtain lab work including blood cultures, chest x-ray, Covid test and reassess.  Chest x-ray without any acute findings.  Lab work significant for leukocytosis of 10.9, creatinine of 1.95 with a GFR of 31.  This creatinine is slightly higher than baseline of around 1.5.  I have ordered IV fluids and broad-spectrum antibiotics, initiated a code sepsis for him with a likely source being his left lower extremity cellulitis. Will order CTA to r/o PE, given his new hypoxia and decreased mobility at baseline. Care handed off to oncoming provider pending remainder of workup. Will admit once workup is complete. Patient discussed with and seen by the attending, Dr. Langston Masker.  All imaging, if done today, including plain films, CT scans, and ultrasounds, independently reviewed by me, and interpretations confirmed via formal radiology reads.  Portions of this note were generated with Lobbyist. Dictation errors may occur despite best attempts at proofreading.  Final Clinical Impression(s) / ED Diagnoses Final diagnoses:  Hypoxia  Generalized weakness  Rx / DC Orders ED Discharge Orders    None       Delia Heady, PA-C 11/15/19 2338    Wyvonnia Dusky, MD 11/16/19 0100

## 2019-11-15 NOTE — ED Triage Notes (Signed)
Arrived via ems due to weakness. Ems states pt was going from the bathroom to the couch and slide off the couch onto the floor. Pt states he has been more weak than normal unable to tolerate walking. Pt has redness and swelling to the lower extremities.

## 2019-11-16 ENCOUNTER — Emergency Department (HOSPITAL_COMMUNITY): Payer: Medicare HMO

## 2019-11-16 ENCOUNTER — Telehealth: Payer: Self-pay | Admitting: Podiatry

## 2019-11-16 DIAGNOSIS — R0902 Hypoxemia: Secondary | ICD-10-CM

## 2019-11-16 DIAGNOSIS — I2699 Other pulmonary embolism without acute cor pulmonale: Secondary | ICD-10-CM | POA: Diagnosis present

## 2019-11-16 LAB — URINALYSIS, ROUTINE W REFLEX MICROSCOPIC
Bilirubin Urine: NEGATIVE
Glucose, UA: >=500 mg/dL — AB
Ketones, ur: NEGATIVE mg/dL
Nitrite: POSITIVE — AB
Protein, ur: 100 mg/dL — AB
RBC / HPF: >50 RBC/hpf — ABNORMAL HIGH (ref 0–5)
Specific Gravity, Urine: 1.018 (ref 1.005–1.030)
WBC, UA: >50 WBC/hpf — ABNORMAL HIGH (ref 0–5)
pH: 5 (ref 5.0–8.0)

## 2019-11-16 LAB — HEMOGLOBIN A1C
Hgb A1c MFr Bld: 9.6 % — ABNORMAL HIGH (ref 4.8–5.6)
Mean Plasma Glucose: 228.82 mg/dL

## 2019-11-16 LAB — CBC
HCT: 50.4 % (ref 39.0–52.0)
Hemoglobin: 16.1 g/dL (ref 13.0–17.0)
MCH: 31.7 pg (ref 26.0–34.0)
MCHC: 31.9 g/dL (ref 30.0–36.0)
MCV: 99.2 fL (ref 80.0–100.0)
Platelets: 178 10*3/uL (ref 150–400)
RBC: 5.08 MIL/uL (ref 4.22–5.81)
RDW: 12.6 % (ref 11.5–15.5)
WBC: 8.5 10*3/uL (ref 4.0–10.5)
nRBC: 0 % (ref 0.0–0.2)

## 2019-11-16 LAB — LACTIC ACID, PLASMA
Lactic Acid, Venous: 1.8 mmol/L (ref 0.5–1.9)
Lactic Acid, Venous: 1.6 mmol/L (ref 0.5–1.9)

## 2019-11-16 LAB — CBG MONITORING, ED
Glucose-Capillary: 240 mg/dL — ABNORMAL HIGH (ref 70–99)
Glucose-Capillary: 211 mg/dL — ABNORMAL HIGH (ref 70–99)
Glucose-Capillary: 201 mg/dL — ABNORMAL HIGH (ref 70–99)

## 2019-11-16 LAB — SARS CORONAVIRUS 2 BY RT PCR (HOSPITAL ORDER, PERFORMED IN ~~LOC~~ HOSPITAL LAB): SARS Coronavirus 2: NEGATIVE

## 2019-11-16 LAB — PROTIME-INR
INR: 1 (ref 0.8–1.2)
Prothrombin Time: 13 seconds (ref 11.4–15.2)

## 2019-11-16 LAB — TROPONIN I (HIGH SENSITIVITY)
Troponin I (High Sensitivity): 19 ng/L — ABNORMAL HIGH (ref <18)
Troponin I (High Sensitivity): 17 ng/L (ref <18)

## 2019-11-16 LAB — HEPARIN LEVEL (UNFRACTIONATED): Heparin Unfractionated: 0.21 IU/mL — ABNORMAL LOW (ref 0.30–0.70)

## 2019-11-16 LAB — APTT: aPTT: 29 seconds (ref 24–36)

## 2019-11-16 LAB — GLUCOSE, CAPILLARY: Glucose-Capillary: 253 mg/dL — ABNORMAL HIGH (ref 70–99)

## 2019-11-16 MED ORDER — ACETAMINOPHEN 325 MG PO TABS
650.0000 mg | ORAL_TABLET | Freq: Four times a day (QID) | ORAL | Status: DC | PRN
  Administered 2019-11-19 – 2019-11-20 (×2): 650 mg via ORAL
  Filled 2019-11-16 (×2): qty 2

## 2019-11-16 MED ORDER — CEFDINIR 300 MG PO CAPS
300.0000 mg | ORAL_CAPSULE | Freq: Two times a day (BID) | ORAL | Status: DC
  Administered 2019-11-16 – 2019-11-17 (×3): 300 mg via ORAL
  Filled 2019-11-16 (×4): qty 1

## 2019-11-16 MED ORDER — CEFDINIR 300 MG PO CAPS: 300.0000 mg | ORAL_CAPSULE | Freq: Two times a day (BID) | ORAL | Status: DC

## 2019-11-16 MED ORDER — PANTOPRAZOLE SODIUM 40 MG PO TBEC
40.0000 mg | DELAYED_RELEASE_TABLET | Freq: Every day | ORAL | Status: DC
  Administered 2019-11-16 – 2019-11-22 (×7): 40 mg via ORAL
  Filled 2019-11-16 (×8): qty 1

## 2019-11-16 MED ORDER — LEVETIRACETAM 500 MG PO TABS
500.0000 mg | ORAL_TABLET | Freq: Two times a day (BID) | ORAL | Status: DC
  Administered 2019-11-16 – 2019-11-22 (×13): 500 mg via ORAL
  Filled 2019-11-16 (×13): qty 1

## 2019-11-16 MED ORDER — ACETAMINOPHEN 650 MG RE SUPP: 650.0000 mg | Freq: Four times a day (QID) | RECTAL | Status: DC | PRN

## 2019-11-16 MED ORDER — INSULIN ASPART 100 UNIT/ML ~~LOC~~ SOLN: 0.0000 [IU] | Freq: Three times a day (TID) | SUBCUTANEOUS | Status: DC

## 2019-11-16 MED ORDER — CLOPIDOGREL BISULFATE 75 MG PO TABS
75.0000 mg | ORAL_TABLET | Freq: Every day | ORAL | Status: DC
  Administered 2019-11-16 – 2019-11-17 (×2): 75 mg via ORAL
  Filled 2019-11-16 (×2): qty 1

## 2019-11-16 MED ORDER — INSULIN GLARGINE 100 UNIT/ML ~~LOC~~ SOLN: 15.0000 [IU] | Freq: Every day | SUBCUTANEOUS | Status: DC

## 2019-11-16 MED ORDER — INSULIN ASPART 100 UNIT/ML ~~LOC~~ SOLN
0.0000 [IU] | Freq: Three times a day (TID) | SUBCUTANEOUS | Status: DC
  Administered 2019-11-16 (×2): 5 [IU] via SUBCUTANEOUS
  Administered 2019-11-17: 11 [IU] via SUBCUTANEOUS
  Administered 2019-11-17: 8 [IU] via SUBCUTANEOUS
  Administered 2019-11-17: 5 [IU] via SUBCUTANEOUS
  Administered 2019-11-18: 3 [IU] via SUBCUTANEOUS
  Administered 2019-11-18: 8 [IU] via SUBCUTANEOUS
  Administered 2019-11-18: 3 [IU] via SUBCUTANEOUS
  Administered 2019-11-19 (×2): 5 [IU] via SUBCUTANEOUS
  Administered 2019-11-19: 3 [IU] via SUBCUTANEOUS
  Administered 2019-11-20 (×2): 5 [IU] via SUBCUTANEOUS
  Administered 2019-11-20: 3 [IU] via SUBCUTANEOUS
  Administered 2019-11-21: 5 [IU] via SUBCUTANEOUS
  Administered 2019-11-21: 3 [IU] via SUBCUTANEOUS
  Administered 2019-11-21: 2 [IU] via SUBCUTANEOUS
  Administered 2019-11-22: 3 [IU] via SUBCUTANEOUS

## 2019-11-16 MED ORDER — IOHEXOL 350 MG/ML SOLN
60.0000 mL | Freq: Once | INTRAVENOUS | Status: AC | PRN
  Administered 2019-11-16: 60 mL via INTRAVENOUS

## 2019-11-16 MED ORDER — SIMVASTATIN 20 MG PO TABS
40.0000 mg | ORAL_TABLET | Freq: Every day | ORAL | Status: DC
  Administered 2019-11-16 – 2019-11-22 (×7): 40 mg via ORAL
  Filled 2019-11-16 (×7): qty 2

## 2019-11-16 MED ORDER — FINASTERIDE 5 MG PO TABS: 5.0000 mg | ORAL_TABLET | Freq: Every day | ORAL | Status: DC

## 2019-11-16 MED ORDER — HEPARIN BOLUS VIA INFUSION
5000.0000 [IU] | Freq: Once | INTRAVENOUS | Status: AC
  Administered 2019-11-16: 5000 [IU] via INTRAVENOUS
  Filled 2019-11-16: qty 5000

## 2019-11-16 MED ORDER — TAMSULOSIN HCL 0.4 MG PO CAPS
0.4000 mg | ORAL_CAPSULE | Freq: Every day | ORAL | Status: DC
  Administered 2019-11-16 – 2019-11-21 (×6): 0.4 mg via ORAL
  Filled 2019-11-16 (×6): qty 1

## 2019-11-16 MED ORDER — IPRATROPIUM-ALBUTEROL 0.5-2.5 (3) MG/3ML IN SOLN
3.0000 mL | RESPIRATORY_TRACT | Status: DC | PRN
  Administered 2019-11-18: 3 mL via RESPIRATORY_TRACT
  Filled 2019-11-16: qty 3

## 2019-11-16 MED ORDER — PNEUMOCOCCAL VAC POLYVALENT 25 MCG/0.5ML IJ INJ
0.5000 mL | INJECTION | INTRAMUSCULAR | Status: AC
  Administered 2019-11-18: 0.5 mL via INTRAMUSCULAR
  Filled 2019-11-16: qty 0.5

## 2019-11-16 MED ORDER — HEPARIN (PORCINE) 25000 UT/250ML-% IV SOLN
1650.0000 [IU]/h | INTRAVENOUS | Status: DC
  Administered 2019-11-16: 1250 [IU]/h via INTRAVENOUS
  Filled 2019-11-16 (×2): qty 250

## 2019-11-16 NOTE — Progress Notes (Signed)
   11/16/19 1731  Obs Status Letter  Medicare Obs Status Letter given? Yes  Condition Code 44 Given: Yes  Patient signature on CC44 notice: Yes  Documentation of 2 MD's agreement CC44: Yes  Code 44 added to claim: Yes  CSW met with Pt at bedside. Pt expressed understanding of change of status and signed note.

## 2019-11-16 NOTE — Progress Notes (Signed)
Subjective:   [name] [name] currently denies SOB, pleuritic pain, or other CP either now or prior to admission. Continues to endorse bilateral lower extremity weakness and left lower extremity swelling (the ankle that his parole anklet is on). He states that his left leg has "not been swollen for too long" but states that he has been very sedentary since his ischemic L MCA stroke for which he was admitted 12/30/17 (SEE BELOW). He states that he does not feel he would be able to walk up the 7 steps to get into his home, currently. He endorses a chronic cough for months. Denies history of easy bleeding, ICH, or other significant bleeding event. Endorses a history of stroke and carotid endarterectomy but denies any cardiac stenting. He is currently on plavix for secondary stroke prevention. He does not recall ever having a seizure but states that he continues to take seizure medication. He states that he has had regular colon cancer screenings in the past without evidence of CA.  Lives with [name] [name], common-law wife, who was born blind and is 42 years old, but does the cooking and cleaning at home. He lives in a house that she owns. He states everything is on the main level except for 7 steps that he needs to go up to get into his home.   Objective:  Vital signs in last 24 hours: Vitals:   11/15/19 2219 11/16/19 0000 11/16/19 0311 11/16/19 0600  BP:  122/85 (!) 153/101 (!) 168/90  Pulse:  96 99 83  Resp:  (!) 23 (!) 23 17  Temp: 100.1 F (37.8 C)     TempSrc: Rectal     SpO2:  93% 94% 94%  Weight:      Height:        Physical Exam Constitutional: no acute distress Head: atraumatic ENT: external ears normal Eyes: EOMI Cardiovascular: regular rate and rhythm, normal heart sounds  Pulmonary: effort normal, lungs clear to ascultation bilaterally Abdominal: flat, nontender, no suprapubic tenderness, no rebound tenderness, bowel sounds normal  Musculoskeletal: LLE swelling and redness, ankle  monitor on LLE which is flush against the skin but not tight Skin: warm and dry, severe onychomycosis bilaterally, multiple small areas of crusted blood between toes, Left 2nd and 3rd toes appear fused Neurological: alert, no focal deficit, pill rolling tremor, 4/5 strength in bilateral upper and lower extremities Psychiatric: normal mood and affect        Assessment/Plan: [name] [name] is a 85 y.o. male with hx of left MCA and occipital embolic stroke without residual deficits, type II diabetes mellitus, seizure disorder on keppra, CKD 3, BPH presenting with bilateral lower extremity weakness found to have a PE on CTA in the R lung, no evidence of R heart strain. Currently saturating well on 2L RA, on heparin.  Active Problems:   HTN (hypertension)   Diabetes mellitus, type 2 ([place])   UTI (urinary tract infection)   Morbid obesity ([place])   Pulmonary embolism (HCC)  Unprovoked pulmonary Embolism Unprovoked DVT of LLE Patient denies any respiratory symptoms, current saturation of 98-100% on 2L O2. CTA with "partially occlusive pulmonary emboli within the right upper lobe and posterior right lower lobe segmental and subsegmental arterial branches. Currently on heparin. Patient was on DAPT for stroke in 2019, now on Plavix alone. No evidence of right ventricular heart strain." Also noted to have LLE redness and swelling, holding off on duplex US since it would not change management. Patient notes a very sedentary lifestyle since  prior stroke, but no clear provocation of this PE. No known hx of malignancy and reports compliance with cancer screenings, no recent trauma. Denies hx of easy bleeding or significant bleeding events. Of note, his ankle monitor is on his LLE where the suspected DVT is, currently is not tight against his leg. -continue heparin drip -continue Plavix 75mg  -considering DOAC - edoxaban vs apixaban -wean O2 as tolerated -duo-nebs q4hr for wheezing, which is now  resolved -spoke on phone with parole officer who will arrange for his ankle monitor to be moved  AKI on CKD III vs progression of CKD Creatinine on presentation of 1.95, baseline in 2019 of 1.6. Received LR bolus x2 and maintenance fluids. -strict I&Os -daily BMP -avoid nephrotoxic agents -encourage PO intake, further IVF if necessry  UTI Hematuria Significant urologic history including bilateral ureteral stents in 2017 for ureteral calculi, hx of multiple UTIs. He notes foul smelling urine recently. Also reports chronic urinary frequency, he keeps a coffee can by his bed that he urinates into. Denies dysuria. UA with large leukocytes, positive nitrites, over 50 WBC, many bacteria, moderate hemoglobin. Previous culture in 2019 sensitive to Ceftriaxone. -continue cefdinir, day 1 (s/p 1 day ceftriaxone) -f/u urine culture -repeat UA prior to discharge to evaluate hematuria  Onchymycosis Patient's toes highly suspicious for onchymycosis. Also significant for fusion of L digits 2 and 3.  - Podiatry consult, appreciate assistance  History of Type II Diabetes A1c of 9.6. On Glimepiride and Jardiance at home. - Novolog sliding scale  History of Seizure Disorder No seizures noted this admission, and patient denies hx of seizures. - Continue home keppra 500 mg BID  History of BPH - Continue home flomax .4 mg daily  History of GERD - Protonix 40 mg QD ordered  Diet: heart healthy IVF: n/a VTE: heparin Prior to Admission Living Arrangement: home Anticipated Discharge Location: SNF vs home Barriers to Discharge: medical management Dispo: Anticipated discharge in approximately 1-2 day(s).   Andrew Au, MD 11/16/2019, 6:46 AM Pager: [phone] After 5pm on weekdays and 1pm on weekends: On Call pager [phone]

## 2019-11-16 NOTE — ED Notes (Signed)
Numbers given by [employer] if L ankle bracelet needs to be adjusted d/t swelling.  Mrs. [name] [name], parole officer [phone] [facility] office business hours [phone] [facility] office after hours [phone]

## 2019-11-16 NOTE — Progress Notes (Signed)
Inpatient Diabetes Program Recommendations  AACE/ADA: New Consensus Statement on Inpatient Glycemic Control (2015)  Target Ranges:  Prepandial:   less than 140 mg/dL      Peak postprandial:   less than 180 mg/dL (1-2 hours)      Critically ill patients:  140 - 180 mg/dL   Lab Results  Component Value Date   GLUCAP 240 (H) 11/16/2019   HGBA1C 9.6 (H) 11/16/2019    Review of Glycemic Control Results for [name] [name], [name] [name] (MRN [id number]) as of 11/16/2019 10:48  Ref. Range 11/15/2019 22:19 11/16/2019 07:54  Glucose-Capillary Latest Ref Range: 70 - 99 mg/dL 358 (H) 240 (H)   Diabetes history: DM 2 Outpatient Diabetes medications:  Amaryl 1 mg daily with breakfast, Januvia 50 mg daily Current orders for Inpatient glycemic control:  Novolog resistant tid with meals  Inpatient Diabetes Program Recommendations:    Please reduce Novolog correction to moderate and consider adding Lantus 18 units daily.   Thanks  Adah Perl, RN, BC-ADM Inpatient Diabetes Coordinator Pager [phone] (8a-5p)

## 2019-11-16 NOTE — Progress Notes (Signed)
ANTICOAGULATION CONSULT NOTE - Initial Consult  Pharmacy Consult for IV heparin Indication: PE  No Known Allergies  Patient Measurements: Height: 5\' 8"  (172.7 cm) Weight: 106.6 kg (235 lb) IBW/kg (Calculated) : 68.4 Heparin Dosing Weight: 91.8 kg  Vital Signs: Temp: 100.1 F (37.8 C) (09/01 2219) Temp Source: Rectal (09/01 2219) BP: 122/85 (09/02 0000) Pulse Rate: 96 (09/02 0000)  Labs: Recent Labs    11/15/19 2222 11/16/19 0100 11/16/19 0150  HGB 16.0  --   --   HCT 50.2  --   --   PLT 182  --   --   LABPROT  --  13.0  --   INR  --  1.0  --   CREATININE 1.95*  --   --   TROPONINIHS  --   --  19*    Estimated Creatinine Clearance: 34.6 mL/min (A) (by C-G formula based on SCr of 1.95 mg/dL (H)).   Medical History: Past Medical History:  Diagnosis Date  . Arthritis   . Diabetes mellitus without complication ([place])    type 2  . Prostate disease   . Renal calculi   . Renal disorder    kidney stones  . Seizures (HCC)     Medications:  Infusions:  . lactated ringers    . lactated ringers 150 mL/hr at 11/16/19 0107  . vancomycin 1,250 mg (11/16/19 0222)    Assessment: 85 yo male admitted with weakness, cellulitis, new hypoxia.  PE found on CT scan.  Pharmacy asked to begin IV heparin.  No known anticoagulants PTA.  Goal of Therapy:  Heparin level 0.3-0.7 units/ml Monitor platelets by anticoagulation protocol: Yes   Plan:  1. Start IV heparin with bolus of 5000 units x 1 now. 2. Then start heparin gtt at 1250 units/hr. 3. Check heparin level in 8 hrs. 4. Daily heparin level and CBC.  [place] Crane, Roylene Reason, BCCP Clinical Pharmacist  11/16/2019 3:06 AM   [facility] pharmacy phone numbers are listed on [email or web address]

## 2019-11-16 NOTE — H&P (Signed)
Date: 11/16/2019               Patient Name:  [name] [name] MRN: [id number]  DOB: Jan 01, 1935 Age / Sex: 85 y.o., male   PCP: Clovia Cuff, MD         Medical Service: Internal Medicine Teaching Service         Attending Physician: Dr. Lucious Groves, DO    First Contact: Dr. Bridgett Larsson Pager: [phone]  Second Contact: Dr. Myrtie Hawk  Pager: [phone]       After Hours (After 5p/  First Contact Pager: [phone]  weekends / holidays): Second Contact Pager: [phone]   Chief Complaint: Weakness   History of Present Illness:   [name] is an 85 year old male with PMHx of left MCA and occipital embolic stroke without residual deficits, type II diabetes mellitus, seizures on keppra, endorses that he was in his usual state of health until earlier this evening when he endorsed feeling weakness in his bilateral lower extremities. He notes that he was unable to use his rollator from his recliner. He denies any associated pain or recent falls. Denies similar episodes to this in the past. He denies feeling weak in any other part of his body. He denies any recent fever or chills, chest pain, back pain, shortness of breath. He denies any night sweats. He does endorse a chronic productive cough, he is unsure of the color of the phlegm. He does note being more wheezy than normal. He denies dysuria, but notes foul-smelling urine. He notes occasional episodes of watery stools for which he takes imodium. He also endorses chronic lower extremity edema due to inactivity. He also states that he has had worsening of his toe appearance. He is followed by podiatry but has not seen them in a year.  Of significance, he states that his prior stroke was due to blockages in his neck. At the time, he was noted to have significant left carotid artery stenosis for which he underwent endarterectomy with angioplasty. He denies a history of having clots in his legs or lungs. He takes his medications regularly. He has an ankle  monitor for his recent house incarceration that he has been wearing for 4 years  He has no other concerns or questions at the time of my examination.  Meds:  No current facility-administered medications on file prior to encounter.   Current Outpatient Medications on File Prior to Encounter  Medication Sig Dispense Refill  . acetaminophen (TYLENOL) 325 MG tablet Take 1-2 tablets (325-650 mg total) by mouth every 4 (four) hours as needed for mild pain.    Marland Kitchen aspirin 81 MG tablet Take 81 mg by mouth daily.     . clopidogrel (PLAVIX) 75 MG tablet Take 1 tablet (75 mg total) by mouth daily. 30 tablet 0  . finasteride (PROSCAR) 5 MG tablet Take 5 mg by mouth daily.    Marland Kitchen glimepiride (AMARYL) 1 MG tablet Take 1 tablet (1 mg total) by mouth daily with breakfast. 30 tablet 0  . levETIRAcetam (KEPPRA) 500 MG tablet Take 1 tablet (500 mg total) by mouth 2 (two) times daily. 60 tablet 0  . pantoprazole (PROTONIX) 40 MG tablet Take 1 tablet (40 mg total) by mouth daily.    Marland Kitchen senna-docusate (SENOKOT-S) 8.6-50 MG tablet Take 2 tablets by mouth at bedtime. 60 tablet 0  . simvastatin (ZOCOR) 40 MG tablet Take 1 tablet (40 mg total) by mouth daily. 30 tablet 0  .  sitaGLIPtin (JANUVIA) 50 MG tablet Take 1 tablet (50 mg total) by mouth daily. 30 tablet 0  . tamsulosin (FLOMAX) 0.4 MG CAPS capsule Take 1 capsule (0.4 mg total) by mouth daily after supper. 30 capsule 0    Allergies: Allergies as of 11/15/2019  . (No Known Allergies)   Past Medical History:  Diagnosis Date  . Arthritis   . Diabetes mellitus without complication ([place])    type 2  . Prostate disease   . Renal calculi   . Renal disorder    kidney stones  . Seizures ([place])     Family History:  Mother - breast cancer, deceased at 68  Father - Parkinson's disease, deceased at 90  Social History:  Denies any tobacco or alcohol use.  Denies any current illicit drug use.  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 Moved to [place] with [employer].   Surgical History:  Past Surgical History:  Procedure Laterality Date  . CYSTOSCOPY W/ URETERAL STENT PLACEMENT Bilateral 08/10/2015   Procedure: CYSTOSCOPY WITH BILATERAL RETROGRADE PYELOGRAM/BILATERAL URETERAL STENT PLACEMENT;  Surgeon: Kathie Rhodes, MD;  Location: [facility];  Service: Urology;  Laterality: Bilateral;  . CYSTOSCOPY WITH RETROGRADE PYELOGRAM, URETEROSCOPY AND STENT PLACEMENT Bilateral 09/02/2015   Procedure: CYSTOSCOPY BILATERAL STENT REMOVAL BILATERAL RETROGRADE PYELOGRAM, BILATERAL URETEROSCOPY AND BILATERAL STENT PLACEMENT;  Surgeon: Kathie Rhodes, MD;  Location: [facility];  Service: Urology;  Laterality: Bilateral;  . ENDARTERECTOMY Left 12/21/2017   Procedure: Left ENDARTERECTOMY CAROTID;  Surgeon: Angelia Mould, MD;  Location: [facility];  Service: Vascular;  Laterality: Left;  . HOLMIUM LASER APPLICATION Bilateral 5/63/1497   Procedure: BILATERAL HOLMIUM LASER  LITHO ;  Surgeon: Kathie Rhodes, MD;  Location: [facility];  Service: Urology;  Laterality: Bilateral;  . JOINT REPLACEMENT Right 2007   hip, Dr. Alvan Dame  . PATCH ANGIOPLASTY Left 12/21/2017   Procedure: PATCH ANGIOPLASTY;  Surgeon: Angelia Mould, MD;  [facility];  Service: Vascular;  Laterality: Left;  . TONSILLECTOMY Bilateral    70 years ago   Review of Systems: A complete ROS was negative except as per HPI.   Physical Exam: Blood pressure (!) 153/101, pulse 99, temperature 100.1 F (37.8 C), temperature source Rectal, resp. rate (!) 23, height 5\' 8"  (1.727 m), weight 106.6 kg, SpO2 94 %. Physical Exam Constitutional:      General: He is not in acute distress.    Appearance: He is not ill-appearing, toxic-appearing or diaphoretic.  HENT:     Head: Normocephalic and atraumatic.     Nose:     Comments: Nasal cannula Eyes:     Extraocular Movements: Extraocular movements intact.  Cardiovascular:     Rate and  Rhythm: Normal rate and regular rhythm.     Heart sounds: No murmur heard.  No friction rub. No gallop.   Pulmonary:     Breath sounds: Rhonchi present.  Abdominal:     General: Bowel sounds are normal.     Palpations: Abdomen is soft.     Tenderness: There is no abdominal tenderness. There is no guarding or rebound.  Musculoskeletal:        General: Swelling (LLE) present.     Cervical back: Normal range of motion.     Right lower leg: Edema (1+) present.     Left lower leg: Edema (1+) present.     Comments: Bilateral toes consistent with severe onychomycosis.   LLE ankle monitor in place  Skin:    Comments: Left lower extremity is erythematous and edematous. Did not appreciate warmth in one extremity over the other.   Neurological:     General: No focal deficit present.     Mental Status: He is alert and oriented to person, place, and time.  Psychiatric:        Mood and Affect: Mood normal.        Behavior: Behavior normal.    EKG: personally reviewed my interpretation is Rate of 95, sinus rhythm. Normal PR. Normal QT. RBBB and LAFB. unchanged EKG from 12/18/2017   CXR:  IMPRESSION: No active disease.  CT ANGIO CHEST:  IMPRESSION: Partially occlusive pulmonary emboli within the right upper lobe and posterior right lower lobe segmental and subsegmental arterial branches. No evidence of right ventricular heart strain. Hazy ground-glass atelectasis at the posterior right lung base.  Assessment & Plan by Problem: Active Problems:   Pulmonary embolism (HCC)  Joe Gee is an 85 y/o M who presented to the ED with new onset lower extremity weakness bilaterally. He also notes wheezing and worsening lower extremity swelling as well as foul smelling urine. He was found to have redness and swelling of his left lower extremity. A CT chest revealed PE of the right upper lobe and posterior right lower lobe segmental and subsegmental arterial branches. Also found to have UTI and  worsening kidney function. Will be admitted to our team for pulmonary embolism, UTI, and AKI on CKD.  #Pulmonary Embolism - Patient asymptomatic, CT revealed right upper lobe and posterior right lower lobe segmental and subsegmental arterial branches with partially occlusive emboli. - Found to have LLE redness/swelling, suspected source - ED suspected cellulitis, however, believe findings are more consistent with DVT. Discontinued broad spectrum antibiotics and blood cultures. Lactic acid 1.8. - Heparin gtt ordered in ED, patient on plavix and statin at home. On aspirin in the past but stopped in 2019 - Continue 75 mg plavix daily and simvastatin 40 mg daily - Last echo 4742, Grade 1 diastolic dysfunction. EF 50-55%. No heart strain seen - Do not believe an echo is warranted at this time due to no heart strain seen on prior and suspected etiology of PE being the DVT/immobilization - History of CVD and embolic CVA from carotid artery stenosis - Low suspicion for other hypercoagulable states such as malignancy, trauma, coagulation disorders.  - PTT 13.0, INR of 1.0 - Will consider removing patient's ankle monitor as it is on left lower extremity. Police officer in ED called, states the monitor is not through his services, will need to call patient's Biomedical scientist. He does not have the phone number and his wife was not answering the phone. Will try again in the morning.   #UTI - Patient with extensive urological history, ureteral stent placement in 2017 for bilateral ureteral calculi, multiple UTI's - Patient notes recent foul smelling urine - Urinalysis revealed large leukocytes, positive nitrites, over 50 WBC, many bacteria seen. Lab called and state no squamous epithelia were seen.  - Urine culture pending, patient sensitive to ceftriaxone on past culture in 2019. - Received one dose of ceftriaxone while in the ED. Cefdinir 300 mg Q12H for 10 days.   #AKI on CKD - sCr up to 1.95 today from  baseline of 1.60 in 2019. He received 2 liters of LR in the ED.  - May have component of progression of CKD.  - Monitor renal function - Strict I&O  - Avoid nephrotoxic agents as able  #  Onchymycosis - Patient's toes highly suspicious for onchymycosis - Consider terbinafine treatemnt  - Podiatry referral placed - Appreciate their assistance in patient care  #Wheezing - Patient denies history of COPD, Emphysema, Asthma.  - Suspected etiology PE vs deconditioning - Duoneb 102mL Q4H PRN ordered  #Deconditioning - PT/OT referral placed  #History of Type II Diabetes - Novolog sliding scale - Hemoglobin A1C ordered  #History of Seizure Disorder - Continue home keppra 500 mg BID  #History of BPH - Continue home flomax .4 mg daily  #History of GERD - Protonix 40 mg QD ordered  Diet: Heart Healthy DVT Prophylaxis: Heparin Code Status: Full Code  Dispo: Admit patient to Inpatient with expected length of stay greater than 2 midnights.  Signed: Riesa Pope, MD 11/16/2019, 5:14 AM

## 2019-11-16 NOTE — Progress Notes (Signed)
The patient is an 85 year old male presenting with bilateral lower extremity weakness and swelling of the left lower extremity. His past medical history includes left MCA and occipital embolic stroke without residual deficits, type II diabetes mellitus, hyperlipidemia, and seizures on Keppra. A chest CT revealed a PE of the right upper lobe and posterior right lower lobe segmental and subsegmental arterial branches.  The patient is currently receiving unfractionated heparin 1250 units/hr (weight-based dosing per protocol), which has recently increased to 1450 u/hr per-protocol based on his initial six-hour anti-Xa level was 0.21 u/mL. He will need to be transitioned to an alternative anticoagulation agent upon discharge. I will recommend a few options to the team. The first option is to use edoxaban. Edoxaban requires five days of parenteral therapy before initiation of edoxaban. Since this is day 1 of therapy with unfractionated heparin, he should receive four days of enoxaparin and then switch to edoxaban with no overlap. His CrCl is between 15-50 mL/min, so edoxaban will need to be given at a reduced dose of 30 mg PO daily. An alternative option would be to switch him to Xarelto 15 mg PO BID for 21 days, followed by 20 mg PO daily. However, there is a concern with using Xarelto in this patient since he is on Keppra. Keppra has the potential to be a PG-P inducer resulting in decreased concentration and efficacy of the Xarelto. This recommendation is based on a case report ([place], [place] C, [place] P. Rivaroxaban plasma levels and levetiracetam: a case report. Ann Intern Med. 2020;173(1):71-72) where rivaroxaban was found to be less effective. Although there is little evidence to suggest that the level of PG-P induction with Keppra is clinically relevant, therapy would need to be monitored since this drug interaction exists. It is important to note that the patient reported no history of seizures on rounds this  morning, so it may be reasonable to evaluate the need to continue Keppra in this patient. In addition, the patient has been on clopidogrel since he had a stroke in 2019 and has also been on aspirin in the past. If a DOAC is started, the clopidogrel should be discontinued, and aspirin can be continued as antiplatelet therapy.

## 2019-11-16 NOTE — Telephone Encounter (Signed)
Dr. Bridgett Larsson called to follow up on a consult to give more information. He stated the patient has b/l onychomycosis, chronic diabetic, crusted blood in creases between toes, and two toes look fused on the left foot.

## 2019-11-16 NOTE — Progress Notes (Signed)
ANTICOAGULATION CONSULT NOTE - Initial Consult  Pharmacy Consult for IV heparin Indication: PE  No Known Allergies  Patient Measurements: Height: 5\' 8"  (172.7 cm) Weight: 106.6 kg (235 lb) IBW/kg (Calculated) : 68.4 Heparin Dosing Weight: 91.8 kg  Vital Signs: BP: 138/78 (09/02 1300) Pulse Rate: 70 (09/02 1300)  Labs: Recent Labs    11/15/19 2222 11/16/19 0100 11/16/19 0150 11/16/19 0610 11/16/19 1316  HGB 16.0  --   --  16.1  --   HCT 50.2  --   --  50.4  --   PLT 182  --   --  178  --   APTT  --  29  --   --   --   LABPROT  --  13.0  --   --   --   INR  --  1.0  --   --   --   HEPARINUNFRC  --   --   --   --  0.21*  CREATININE 1.95*  --   --   --   --   TROPONINIHS  --   --  19*  --   --     Estimated Creatinine Clearance: 34.6 mL/min (A) (by C-G formula based on SCr of 1.95 mg/dL (H)).   Medical History: Past Medical History:  Diagnosis Date  . Arthritis   . Diabetes mellitus without complication ([place])    type 2  . Prostate disease   . Renal calculi   . Renal disorder    kidney stones  . Seizures ([place])     Medications:  Infusions:  . heparin 1,250 Units/hr (11/16/19 1202)    Assessment: 85 yo male admitted with weakness, cellulitis, new hypoxia.  PE found on CT scan.  Pharmacy asked to begin IV heparin.  No known anticoagulants PTA but is on clopidogrel.  8-hour heparin level is subtherapeutic at 0.21. Hgb is 16, PLT 170s. Reports of some hematuria likely related to UTI, now on antibiotics. Also has minor bleeding around toes from onychomycosis but no major bleeding noted.  Goal of Therapy:  Heparin level 0.3-0.7 units/ml Monitor platelets by anticoagulation protocol: Yes   Plan:  Increase heparin rate to 1,450 units/hr Check heparin level in 8 hrs. Daily heparin level and CBC, monitor signs of bleeding Follow up with plans for oral anticoagulation   Mercy Riding, PharmD PGY1 Acute Care Pharmacy Resident Please refer to [facility] for  unit-specific pharmacist

## 2019-11-17 ENCOUNTER — Encounter (HOSPITAL_COMMUNITY): Payer: Self-pay | Admitting: Internal Medicine

## 2019-11-17 ENCOUNTER — Other Ambulatory Visit: Payer: Self-pay | Admitting: Student

## 2019-11-17 DIAGNOSIS — B351 Tinea unguium: Secondary | ICD-10-CM | POA: Diagnosis present

## 2019-11-17 DIAGNOSIS — Z87442 Personal history of urinary calculi: Secondary | ICD-10-CM | POA: Diagnosis not present

## 2019-11-17 DIAGNOSIS — R0601 Orthopnea: Secondary | ICD-10-CM | POA: Diagnosis not present

## 2019-11-17 DIAGNOSIS — N3000 Acute cystitis without hematuria: Secondary | ICD-10-CM

## 2019-11-17 DIAGNOSIS — Z7902 Long term (current) use of antithrombotics/antiplatelets: Secondary | ICD-10-CM | POA: Diagnosis not present

## 2019-11-17 DIAGNOSIS — N179 Acute kidney failure, unspecified: Secondary | ICD-10-CM | POA: Diagnosis present

## 2019-11-17 DIAGNOSIS — I2693 Single subsegmental pulmonary embolism without acute cor pulmonale: Secondary | ICD-10-CM

## 2019-11-17 DIAGNOSIS — I82402 Acute embolism and thrombosis of unspecified deep veins of left lower extremity: Secondary | ICD-10-CM | POA: Diagnosis present

## 2019-11-17 DIAGNOSIS — E1122 Type 2 diabetes mellitus with diabetic chronic kidney disease: Secondary | ICD-10-CM | POA: Diagnosis present

## 2019-11-17 DIAGNOSIS — Z79899 Other long term (current) drug therapy: Secondary | ICD-10-CM | POA: Diagnosis not present

## 2019-11-17 DIAGNOSIS — I5032 Chronic diastolic (congestive) heart failure: Secondary | ICD-10-CM | POA: Diagnosis present

## 2019-11-17 DIAGNOSIS — B9620 Unspecified Escherichia coli [E. coli] as the cause of diseases classified elsewhere: Secondary | ICD-10-CM | POA: Diagnosis present

## 2019-11-17 DIAGNOSIS — I130 Hypertensive heart and chronic kidney disease with heart failure and stage 1 through stage 4 chronic kidney disease, or unspecified chronic kidney disease: Secondary | ICD-10-CM | POA: Diagnosis present

## 2019-11-17 DIAGNOSIS — Z7984 Long term (current) use of oral hypoglycemic drugs: Secondary | ICD-10-CM | POA: Diagnosis not present

## 2019-11-17 DIAGNOSIS — I69351 Hemiplegia and hemiparesis following cerebral infarction affecting right dominant side: Secondary | ICD-10-CM | POA: Diagnosis not present

## 2019-11-17 DIAGNOSIS — I2699 Other pulmonary embolism without acute cor pulmonale: Secondary | ICD-10-CM | POA: Diagnosis present

## 2019-11-17 DIAGNOSIS — Z7982 Long term (current) use of aspirin: Secondary | ICD-10-CM | POA: Diagnosis not present

## 2019-11-17 DIAGNOSIS — I452 Bifascicular block: Secondary | ICD-10-CM | POA: Diagnosis present

## 2019-11-17 DIAGNOSIS — Z96641 Presence of right artificial hip joint: Secondary | ICD-10-CM | POA: Diagnosis present

## 2019-11-17 DIAGNOSIS — Z6841 Body Mass Index (BMI) 40.0 and over, adult: Secondary | ICD-10-CM | POA: Diagnosis not present

## 2019-11-17 DIAGNOSIS — Z820 Family history of epilepsy and other diseases of the nervous system: Secondary | ICD-10-CM | POA: Diagnosis not present

## 2019-11-17 DIAGNOSIS — Z23 Encounter for immunization: Secondary | ICD-10-CM | POA: Diagnosis present

## 2019-11-17 DIAGNOSIS — Z20822 Contact with and (suspected) exposure to covid-19: Secondary | ICD-10-CM | POA: Diagnosis present

## 2019-11-17 DIAGNOSIS — N3091 Cystitis, unspecified with hematuria: Secondary | ICD-10-CM | POA: Diagnosis present

## 2019-11-17 DIAGNOSIS — N183 Chronic kidney disease, stage 3 unspecified: Secondary | ICD-10-CM | POA: Diagnosis present

## 2019-11-17 DIAGNOSIS — R0902 Hypoxemia: Secondary | ICD-10-CM | POA: Diagnosis present

## 2019-11-17 DIAGNOSIS — E785 Hyperlipidemia, unspecified: Secondary | ICD-10-CM | POA: Diagnosis present

## 2019-11-17 LAB — CBC
HCT: 43.4 % (ref 39.0–52.0)
Hemoglobin: 13.9 g/dL (ref 13.0–17.0)
MCH: 30.9 pg (ref 26.0–34.0)
MCHC: 32 g/dL (ref 30.0–36.0)
MCV: 96.4 fL (ref 80.0–100.0)
Platelets: 154 10*3/uL (ref 150–400)
RBC: 4.5 MIL/uL (ref 4.22–5.81)
RDW: 12.4 % (ref 11.5–15.5)
WBC: 6.4 10*3/uL (ref 4.0–10.5)
nRBC: 0 % (ref 0.0–0.2)

## 2019-11-17 LAB — BASIC METABOLIC PANEL
Anion gap: 9 (ref 5–15)
BUN: 23 mg/dL (ref 8–23)
CO2: 23 mmol/L (ref 22–32)
Calcium: 8.6 mg/dL — ABNORMAL LOW (ref 8.9–10.3)
Chloride: 105 mmol/L (ref 98–111)
Creatinine, Ser: 1.64 mg/dL — ABNORMAL HIGH (ref 0.61–1.24)
GFR calc Af Amer: 44 mL/min — ABNORMAL LOW (ref >60)
GFR calc non Af Amer: 38 mL/min — ABNORMAL LOW (ref >60)
Glucose, Bld: 231 mg/dL — ABNORMAL HIGH (ref 70–99)
Potassium: 4.1 mmol/L (ref 3.5–5.1)
Sodium: 137 mmol/L (ref 135–145)

## 2019-11-17 LAB — URINE CULTURE: Culture: >=100000 — AB

## 2019-11-17 LAB — GLUCOSE, CAPILLARY
Glucose-Capillary: 213 mg/dL — ABNORMAL HIGH (ref 70–99)
Glucose-Capillary: 252 mg/dL — ABNORMAL HIGH (ref 70–99)
Glucose-Capillary: 205 mg/dL — ABNORMAL HIGH (ref 70–99)
Glucose-Capillary: 254 mg/dL — ABNORMAL HIGH (ref 70–99)

## 2019-11-17 LAB — HEPARIN LEVEL (UNFRACTIONATED): Heparin Unfractionated: 0.25 IU/mL — ABNORMAL LOW (ref 0.30–0.70)

## 2019-11-17 MED ORDER — RIVAROXABAN 15 MG PO TABS
15.0000 mg | ORAL_TABLET | Freq: Two times a day (BID) | ORAL | Status: DC
  Administered 2019-11-17 – 2019-11-22 (×10): 15 mg via ORAL
  Filled 2019-11-17 (×10): qty 1

## 2019-11-17 MED ORDER — ASPIRIN EC 81 MG PO TBEC
81.0000 mg | DELAYED_RELEASE_TABLET | Freq: Every day | ORAL | Status: DC
  Administered 2019-11-18 – 2019-11-22 (×5): 81 mg via ORAL
  Filled 2019-11-17 (×5): qty 1

## 2019-11-17 MED ORDER — RIVAROXABAN 20 MG PO TABS: 20.0000 mg | ORAL_TABLET | Freq: Every day | ORAL | Status: DC

## 2019-11-17 MED ORDER — NITROFURANTOIN MONOHYD MACRO 100 MG PO CAPS: 100.0000 mg | ORAL_CAPSULE | Freq: Two times a day (BID) | ORAL | Status: DC

## 2019-11-17 MED ORDER — RIVAROXABAN 15 MG PO TABS: 15.0000 mg | ORAL_TABLET | Freq: Two times a day (BID) | ORAL | Status: DC

## 2019-11-17 MED ORDER — INSULIN GLARGINE 100 UNIT/ML ~~LOC~~ SOLN
18.0000 [IU] | Freq: Every day | SUBCUTANEOUS | Status: DC
  Administered 2019-11-17 – 2019-11-19 (×3): 18 [IU] via SUBCUTANEOUS
  Filled 2019-11-17 (×4): qty 0.18

## 2019-11-17 MED ORDER — RIVAROXABAN (XARELTO) VTE STARTER PACK (15 & 20 MG): ORAL_TABLET | ORAL | 0 refills | Status: DC

## 2019-11-17 MED ORDER — CEFDINIR 300 MG PO CAPS
300.0000 mg | ORAL_CAPSULE | Freq: Two times a day (BID) | ORAL | Status: AC
  Administered 2019-11-17 – 2019-11-20 (×7): 300 mg via ORAL
  Filled 2019-11-17 (×7): qty 1

## 2019-11-17 MED ORDER — CEFDINIR 300 MG PO CAPS: 300.0000 mg | ORAL_CAPSULE | Freq: Two times a day (BID) | ORAL | 0 refills | Status: DC

## 2019-11-17 MED FILL — XARELTO STARTER PACK: 15 & 20 | 30 days supply | Qty: 51 | Fill #0

## 2019-11-17 MED FILL — CEFDINIR 300 MG CAPSULE: 300 | 3 days supply | Qty: 6 | Fill #0

## 2019-11-17 NOTE — Consult Note (Signed)
Spoke with Dr. Bridgett Larsson, resident, regarding podiatry consult for onychomycosis of toenails. States there are no open wound or acute concerns, simply elongated/onychomycotic nails.   Can be managed outpatient. Recommend follow up post discharge in office for routine nail care. Podiatry to sign off.   - Dr. Doree Barthel, DPM [facility]  Dr. Edrick Kins, DPM    [address], [place] [address]                Office [phone]  Fax [phone]

## 2019-11-17 NOTE — Progress Notes (Signed)
   11/17/19 1155  Clinical Encounter Type  Visited With Patient  Visit Type Initial  Referral From Nurse  Consult/Referral To Chaplain  Visit room to provide education on AD; however medical team was in session with patient. Chaplain will return later This note was prepared by Jeanine Luz.  For questions please contact by phone [phone]

## 2019-11-17 NOTE — Progress Notes (Signed)
OT Cancellation Note  Patient Details Name: [name] [name] MRN: [id number] DOB: 1934-11-06   Cancelled Treatment:    Reason Eval/Treat Not Completed: Other (comment) Medical issues which prohibited therapy patient has received heparin for over 24 hours however remains in subtherapeutic range (0.25) in setting of acute PE. Will follow up.  Ramond Dial, OT/L   Acute OT Clinical Specialist Acute Rehabilitation Services Pager [phone] Office [phone]  11/17/2019, 9:17 AM

## 2019-11-17 NOTE — Progress Notes (Signed)
[place] for IV heparin Indication: PE  No Known Allergies  Patient Measurements: Height: 5\' 8"  (172.7 cm) Weight: 106.6 kg (235 lb) IBW/kg (Calculated) : 68.4 Heparin Dosing Weight: 91.8 kg  Vital Signs: Temp: 98 F (36.7 C) (09/02 2305) Temp Source: Oral (09/02 2305) BP: 118/74 (09/02 2305) Pulse Rate: 93 (09/02 2305)  Labs: Recent Labs    11/15/19 2222 11/16/19 0100 11/16/19 0150 11/16/19 0610 11/16/19 1316 11/16/19 2317  HGB 16.0  --   --  16.1  --   --   HCT 50.2  --   --  50.4  --   --   PLT 182  --   --  178  --   --   APTT  --  29  --   --   --   --   LABPROT  --  13.0  --   --   --   --   INR  --  1.0  --   --   --   --   HEPARINUNFRC  --   --   --   --  0.21* 0.25*  CREATININE 1.95*  --   --   --   --   --   TROPONINIHS  --   --  19*  --  17  --     Estimated Creatinine Clearance: 34.6 mL/min (A) (by C-G formula based on SCr of 1.95 mg/dL (H)).   Assessment: 85 yo male admitted with weakness, cellulitis, new hypoxia.  PE found on CT scan.  Pharmacy asked to begin IV heparin.  No known anticoagulants PTA but is on clopidogrel.  Heparin level remains subtherapeutic (0.25) on gtt at 1450 units/hr. No issues with line or bleeding reported per RN.    Goal of Therapy:  Heparin level 0.3-0.7 units/ml Monitor platelets by anticoagulation protocol: Yes   Plan:  Increase heparin rate to 1650 units/hr Check heparin level in 8 hrs.  Sherlon Handing, PharmD, BCPS Please see amion for complete clinical pharmacist phone list 11/17/2019 12:09 AM

## 2019-11-17 NOTE — Progress Notes (Signed)
°   11/17/19 1405  Clinical Encounter Type  Visited With Patient  Visit Type Follow-up  Referral From Nurse  Consult/Referral To Chaplain  Spoke with patient, he is requesting Advanced Directive. Advised notary not on duty at this time, will have chaplain follow-up on  11/21/19.

## 2019-11-17 NOTE — Progress Notes (Signed)
Inpatient Diabetes Program Recommendations  AACE/ADA: New Consensus Statement on Inpatient Glycemic Control (2015)  Target Ranges:  Prepandial:   less than 140 mg/dL      Peak postprandial:   less than 180 mg/dL (1-2 hours)      Critically ill patients:  140 - 180 mg/dL   Lab Results  Component Value Date   GLUCAP 252 (H) 11/17/2019   HGBA1C 9.6 (H) 11/16/2019    Review of Glycemic Control Results for [name] [name], [name] [name] (MRN [id number]) as of 11/17/2019 14:10  Ref. Range 11/16/2019 07:54 11/16/2019 11:34 11/16/2019 17:30 11/16/2019 21:32 11/17/2019 07:46 11/17/2019 12:18  Glucose-Capillary Latest Ref Range: 70 - 99 mg/dL 240 (H) 211 (H) 201 (H) 253 (H) 213 (H) 252 (H)   Diabetes history: DM 2 Outpatient Diabetes medications:  Amaryl 1 mg daily with breakfast, Januvia 50 mg daily Current orders for Inpatient glycemic control:  Novolog resistant tid with meals  Inpatient Diabetes Program Recommendations:    Please reduce Novolog correction to moderate and consider adding Lantus 18 units daily.   Thank you, Nani Gasser. Darlyn Repsher, RN, MSN, CDE  Diabetes Coordinator Inpatient Glycemic Control Team Team Pager [phone] (8am-5pm) 11/17/2019 2:11 PM

## 2019-11-17 NOTE — Progress Notes (Signed)
   11/17/19 1405  Clinical Encounter Type  Visited With Patient  Visit Type Follow-up  Referral From Nurse  Consult/Referral To Chaplain  Spoke with patient. He is requesting an Scientist, physiological. Advised him notary is not available at this time, will have chaplain to follow-up with him on 11/21/19.This note was prepared by Jeanine Luz.  For questions please contact by phone [phone]

## 2019-11-17 NOTE — Plan of Care (Signed)

## 2019-11-17 NOTE — Progress Notes (Signed)
PT Cancellation Note  Patient Details Name: [name] [name] MRN: [id number] DOB: 1935/01/08   Cancelled Treatment:    Reason Eval/Treat Not Completed: Medical issues which prohibited therapy patient has received heparin for over 24 hours however remains in subtherapeutic range (0.25) in setting of acute PE. Will monitor throughout day and return (if time/schedule allow) when heparin reaches therapeutic range and he can safely participate in PT session. If patient has urgent rehab needs, please direct message this therapist in [place] (8am-4pm) or page at number below.     Windell Norfolk, DPT, PN1   Supplemental Physical Therapist [facility]    Pager [phone] Acute Rehab Office [phone]

## 2019-11-17 NOTE — Progress Notes (Addendum)
Subjective:   Patient states that he feels wells this morning, but wants to work on increasing his mobility. Denies SOB or chest pain. Currently not on supplemental O2.   Objective:  Vital signs in last 24 hours: Vitals:   11/16/19 1900 11/16/19 2027 11/16/19 2305 11/17/19 0417  BP: 131/72 (!) 158/61 118/74 121/68  Pulse: 96 96 93 90  Resp: 19  18 19   Temp:  97.8 F (36.6 C) 98 F (36.7 C) 98.5 F (36.9 C)  TempSrc:  Oral Oral Oral  SpO2: 98% 97% 98% 90%  Weight:      Height:        Physical Exam Constitutional: no acute distress Head: atraumatic ENT: external ears normal Eyes: EOMI Cardiovascular: regular rate and rhythm, normal heart sounds  Pulmonary: effort normal, lungs clear to ascultation bilaterally, on room air Abdominal: flat, nontender, no suprapubic tenderness, no rebound tenderness, bowel sounds normal  Musculoskeletal: LLE swelling and redness has improved compared to prior, ankle monitor removed by technician at time of interviewed, to be placed on RLE Skin: warm and dry, severe onychomycosis bilaterally, multiple small areas of crusted blood between toes, Left 2nd and 3rd toes appear fused Neurological: alert, no focal deficit, pill rolling tremor Psychiatric: normal mood and affect  Assessment/Plan: [name] [name] is a 85 y.o. male with hx stroke, type II diabetes mellitus, seizure disorder on keppra, CKD 3, BPH presenting with bilateral lower extremity weakness found to have a PE on CTA in the R lung, no evidence of R heart strain. Currently saturating well on RA, anticoagulated with Xarelto.  Active Problems:   HTN (hypertension)   Diabetes mellitus, type 2 ([place])   UTI (urinary tract infection)   Morbid obesity ([place])   Pulmonary embolism (HCC)  Unprovoked pulmonary Embolism Unprovoked DVT of LLE Patient denies any respiratory symptoms, current saturation of 98-100% on 2L O2. CTA with "partially occlusive pulmonary emboli within the right upper lobe  and posterior right lower lobe segmental and subsegmental arterial branches. Currently on heparin. Patient was on DAPT for stroke in 2019, now on Plavix alone. No evidence of right ventricular heart strain." Also noted to have LLE redness and swelling, holding off on duplex US since it would not change management. Patient notes a very sedentary lifestyle since prior stroke, but no clear provocation of this PE. No known hx of malignancy and reports compliance with cancer screenings, no recent trauma. Denies hx of easy bleeding or significant bleeding events. Ankle monitor moved to RLE. Had extensive discussion with pharmacy colleagues regarding choice of anticoagulant given potential interactions with his Keppra, finally decided on Xarelto. -switch heparin to Xarelto 15mg  BID for 21 days then 20mg  daily -switch Plavix to ASA 81mg  daily -duo-nebs q4hr for wheezing, which is now resolved  AKI on CKD III vs progression of CKD Creatinine on presentation of 1.95, baseline in 2019 of 1.6. Received LR bolus x2 and maintenance fluids.  -f/u BMP which is still pending today -strict I&Os -avoid nephrotoxic agents -encourage PO intake, further IVF if necessry  Acute simple cystitis Hematuria Significant urologic history including bilateral ureteral stents in 2017 for ureteral calculi, hx of multiple UTIs. He notes foul smelling urine recently. Also reports chronic urinary frequency, he keeps a coffee can by his bed that he urinates into. Denies dysuria. UA with large leukocytes, positive nitrites, over 50 WBC, many bacteria, moderate hemoglobin. Culture with E coli susceptible to ceftriaxone and macrobid. -continue cefdinir, day 2 (s/p 1 day ceftriaxone) (macrobid ineffective  with current GFR) -repeat UA after completion of Abx  Onychomycosis Patient's toes highly suspicious for onchymycosis. Also significant for fusion of L digits 2 and 3. Podiatry consulted, discussed the case, they will follow up  outpatient for his onychomycosis. - f/u with podiatry outpatient  History of Type II Diabetes A1c of 9.6. On Glimepiride and Jardiance at home. - Novolog sliding scale  History of Seizure Disorder No seizures noted this admission, and patient denies hx of seizures. - Continue home keppra 500 mg BID  History of BPH - Continue home flomax .4 mg daily  History of GERD - Protonix 40 mg QD ordered  Diet: heart healthy IVF: n/a VTE: Xarelto Prior to Admission Living Arrangement: home Anticipated Discharge Location: SNF vs home Barriers to Discharge: medical management Dispo: Anticipated discharge in approximately 1-2 day(s).   Andrew Au, MD 11/17/2019, 6:00 AM Pager: [phone] After 5pm on weekdays and 1pm on weekends: On Call pager [phone]

## 2019-11-17 NOTE — Discharge Summary (Addendum)
Name: [name] [name] MRN: [id number] DOB: 1935/01/12 85 y.o. PCP: Clovia Cuff, MD  Date of Admission: 11/15/2019  9:18 PM Date of Discharge:  Attending Physician: Lucious Groves, DO  Discharge Diagnosis: 1. Unprovoked pulmonary embolism 2. AKI 3. Acute simple cystitis with hematuria 4. Onychomycosis  Discharge Medications: Allergies as of 11/22/2019   No Known Allergies     Medication List    STOP taking these medications   clopidogrel 75 MG tablet Commonly known as: PLAVIX     TAKE these medications   acetaminophen 325 MG tablet Commonly known as: TYLENOL Take 1-2 tablets (325-650 mg total) by mouth every 4 (four) hours as needed for mild pain.   aspirin 81 MG EC tablet Take 1 tablet (81 mg total) by mouth daily. Swallow whole.   aspirin-sod bicarb-citric acid 325 MG Tbef tablet Commonly known as: ALKA-SELTZER Take 325 mg by mouth every 6 (six) hours as needed (cold).   glimepiride 1 MG tablet Commonly known as: AMARYL Take 1 tablet (1 mg total) by mouth daily with breakfast.   ibuprofen 200 MG tablet Commonly known as: ADVIL Take 200 mg by mouth every 6 (six) hours as needed for mild pain.   levETIRAcetam 500 MG tablet Commonly known as: Keppra Take 1 tablet (500 mg total) by mouth 2 (two) times daily.   loperamide 1 MG/5ML solution Commonly known as: IMODIUM Take 1 mg by mouth as needed for diarrhea or loose stools.   metFORMIN 1000 MG tablet Commonly known as: GLUCOPHAGE Take 1,000 mg by mouth in the morning and at bedtime.   pantoprazole 40 MG tablet Commonly known as: PROTONIX Take 1 tablet (40 mg total) by mouth daily.   Pepto-Bismol 262 MG/15ML suspension Generic drug: bismuth subsalicylate Take 30 mLs by mouth every 6 (six) hours as needed for diarrhea or loose stools.   Rivaroxaban Stater Pack (15 mg and 20 mg) Commonly known as: XARELTO STARTER PACK Follow package directions: Take one 15mg  tablet by mouth twice a day. On 9/23, switch to  one 20mg  tablet once a day. Take with food.   senna-docusate 8.6-50 MG tablet Commonly known as: Senokot-S Take 2 tablets by mouth at bedtime.   simvastatin 40 MG tablet Commonly known as: ZOCOR Take 1 tablet (40 mg total) by mouth daily. What changed: when to take this   sitaGLIPtin 50 MG tablet Commonly known as: Januvia Take 1 tablet (50 mg total) by mouth daily.   tamsulosin 0.4 MG Caps capsule Commonly known as: FLOMAX Take 1 capsule (0.4 mg total) by mouth daily after supper.       Disposition and follow-up:   Mr.Dann R Casten was discharged from [facility] in Stable condition.  At the hospital follow up visit please address:  1. Follow up:     A. Unprovoked pulmonary embolism - never had any shortness of breath, comfortable on room air, discharged with Xarelto without definite endpoint, monitor for bleeding.Ensure age appropriate cancer screenings are up to date.      B. AKI- improved with fluids back to baseline     C. Acute simple cystitis with hematuria - urine culture with E coli sensitive to Ceftriaxone, completed course of oral Cefdinir. Repeat UA in 1 week to ensure resolution of hematuria.     D. Onychomycosis - follow up with podiatry  2.  Labs / imaging needed at time of follow-up: urinalysis, BMP, age appropriate cancer screenings, consider Keppra level for theoretical interaction with Xarelto  3.  Pending labs/ test needing follow-up: none  Follow-up Appointments:   Hospital Course by problem list: Unprovoked pulmonary Embolism Unprovoked DVT of LLE Presented with complaint of bilateral LE weakness, denied any respiratory symptoms. Mild hypoxia on presentation which required up to 4L O2, on day of discharge is comfortably breathing on room air. CTA with "partially occlusive pulmonary emboli within the right upper lobe and posterior right lower lobe segmental and subsegmental arterial branches." Started on heparin drip, was subtherapeutic  for 2 days, then transitioned to Xarelto after extensive discussion with our pharmacy colleagues concerning potential interaction with Keppra. No trigger for this PE identified, though he notes a sedentary lifestyle since prior stroke. Also had redness and swelling noted on LLE suspicious for DVT, ankle monitor changed by law enforcement to RLE, thought not to be involved with current issues. Continue Xarelto anticoagulation, likely will need this indefinitely.  AKI on CKD III vs progression of CKD Creatinine on presentation of 1.95, baseline in 2019 of 1.6. Received LR bolus x2 and maintenance fluids. Had mild increase after Lasix. Creatinine has improved to 1.56 at time of discharge.  Acute simple cystitis Hematuria Significant urologic history including bilateral ureteral stents in 2017 for ureteral calculi, hx of multiple UTIs. He notes foul smelling urine recently and chronic urinary frequency. UA with large leukocytes, positive nitrites, over 50 WBC, many bacteria, moderate hemoglobin. Culture with E coli. Started on Ceftriaxone then transitioned for Cefdinir, completed course. Repeat UA outpatient to check for resolution of hematuria.  Hx of stroke Patient was on Plavix for secondary prevention. Switched to ASA 81mg  for dual therapy along with Xarelto.  Discharge Vitals:   BP (!) 151/113 (BP Location: Left Arm)   Pulse 83   Temp 98.6 F (37 C)   Resp 19   Ht 5\' 8"  (1.727 m)   Wt 120.6 kg   SpO2 90%   BMI 40.43 kg/m   Pertinent Labs, Studies, and Procedures:  CT Angio Chest PE W/Cm &/Or Wo Cm  Result Date: 11/16/2019 CLINICAL DATA:  Weakness and swelling to lower extremities EXAM: CT ANGIOGRAPHY CHEST WITH CONTRAST TECHNIQUE: Multidetector CT imaging of the chest was performed using the standard protocol during bolus administration of intravenous contrast. Multiplanar CT image reconstructions and MIPs were obtained to evaluate the vascular anatomy. CONTRAST:  27mL OMNIPAQUE IOHEXOL  350 MG/ML SOLN COMPARISON:  None. FINDINGS: Cardiovascular: There is a optimal opacification of the pulmonary arteries. There is a partially occlusive thrombus seen in the posterior right lower lobe segmental and subsegmental arterial branches. There is also small partially occlusive thrombus seen within the right upper lobe segmental and subsegmental arterial branches. No left-sided filling defects are seen within the pulmonary arteries. The heart is normal in size. No pericardial effusion or thickening. No evidence right heart strain. There is normal three-vessel brachiocephalic anatomy without proximal stenosis. Coronary artery calcifications are seen. Scattered aortic atherosclerosis is noted. Mediastinum/Nodes: No hilar, mediastinal, or axillary adenopathy. Thyroid gland, trachea, and esophagus demonstrate no significant findings. Lungs/Pleura: Hazy ground-glass opacity seen at the posterior right lung base. No large airspace consolidation or pleural effusion. No pleural effusion or pneumothorax. No airspace consolidation. Upper Abdomen: No acute abnormalities present in the visualized portions of the upper abdomen. Musculoskeletal: No chest wall abnormality. No acute or significant osseous findings. Review of the MIP images confirms the above findings. IMPRESSION: Partially occlusive pulmonary emboli within the right upper lobe and posterior right lower lobe segmental and subsegmental arterial branches. No evidence of right ventricular heart strain.  Hazy ground-glass atelectasis at the posterior right lung base. Aortic Atherosclerosis (ICD10-I70.0). Electronically Signed   By: Prudencio Pair M.D.   On: 11/16/2019 02:26   DG Chest Portable 1 View  Result Date: 11/15/2019 CLINICAL DATA:  Hypoxia EXAM: PORTABLE CHEST 1 VIEW COMPARISON:  12/21/2016 FINDINGS: Mildly low lung volumes. Mild diffuse chronic appearing interstitial opacity. No consolidation or effusion. Stable cardiomediastinal silhouette with aortic  atherosclerosis. No pneumothorax. IMPRESSION: No active disease. Electronically Signed   By: Donavan Foil M.D.   On: 11/15/2019 22:07    Specimen Description URINE, RANDOM   Special Requests NONE  Performed at [facility], [place] [address], [place], [place] [address]   Culture >=100,000 COLONIES/mL ESCHERICHIA COLIAbnormal   Report Status 11/17/2019 FINAL   Organism ID, Bacteria ESCHERICHIA COLIAbnormal   Resulting Agency [facility]  Susceptibility   Escherichia coli    MIC    AMPICILLIN >=32 RESIST... Resistant    AMPICILLIN/SULBACTAM >=32 RESIST... Resistant    CEFAZOLIN >=64 RESIST... Resistant    CEFTRIAXONE <=0.25 SENS... Sensitive    CIPROFLOXACIN >=4 RESISTANT  Resistant    GENTAMICIN >=16 RESIST... Resistant    IMIPENEM <=0.25 SENS... Sensitive    NITROFURANTOIN <=16 SENSIT... Sensitive    PIP/TAZO >=128 RESIS... Resistant    TRIMETH/SULFA >=320 RESIS... Resistant       Discharge Instructions: Discharge Instructions    Ambulatory referral to Podiatry   Complete by: As directed    Severe onychomycosis, also some crusted blood to bilateral feet between toes. Spoke with Dr. Amalia Hailey about this case during admission, patient did not require physical visit.   Call MD for:  extreme fatigue   Complete by: As directed    Call MD for:  persistant dizziness or light-headedness   Complete by: As directed    Diet - low sodium heart healthy   Complete by: As directed    Discharge instructions   Complete by: As directed    Mr. Mcgeehan, thank you for allowing [place] to take care of you. Your testing here revealed that you have a blood clot in your lungs. We have started you on a blood thinner for this. The dosing will change after the initial 3 weeks from twice a day to once a day, see instructions below. We are also going to change your Plavix to Aspirin. Besides that, we treated you for a UTI.  Here are your discharge instructions.  1) start Xarelto: 15mg  twice a day for 21  days, followed by 20mg  once a day with dinner 2) stop Plavix 3) start Aspirin 81mg  daily 4) follow up with your PCP 5) follow up with podiatry   Increase activity slowly   Complete by: As directed       Signed: Andrew Au, MD 11/22/2019, 10:25 AM   Pager: [phone]

## 2019-11-18 LAB — CBC
HCT: 42.6 % (ref 39.0–52.0)
Hemoglobin: 13.8 g/dL (ref 13.0–17.0)
MCH: 31.4 pg (ref 26.0–34.0)
MCHC: 32.4 g/dL (ref 30.0–36.0)
MCV: 96.8 fL (ref 80.0–100.0)
Platelets: 165 10*3/uL (ref 150–400)
RBC: 4.4 MIL/uL (ref 4.22–5.81)
RDW: 12.4 % (ref 11.5–15.5)
WBC: 7 10*3/uL (ref 4.0–10.5)
nRBC: 0 % (ref 0.0–0.2)

## 2019-11-18 LAB — GLUCOSE, CAPILLARY
Glucose-Capillary: 160 mg/dL — ABNORMAL HIGH (ref 70–99)
Glucose-Capillary: 195 mg/dL — ABNORMAL HIGH (ref 70–99)
Glucose-Capillary: 271 mg/dL — ABNORMAL HIGH (ref 70–99)
Glucose-Capillary: 219 mg/dL — ABNORMAL HIGH (ref 70–99)

## 2019-11-18 LAB — BASIC METABOLIC PANEL
Anion gap: 9 (ref 5–15)
BUN: 23 mg/dL (ref 8–23)
CO2: 23 mmol/L (ref 22–32)
Calcium: 8.5 mg/dL — ABNORMAL LOW (ref 8.9–10.3)
Chloride: 106 mmol/L (ref 98–111)
Creatinine, Ser: 1.63 mg/dL — ABNORMAL HIGH (ref 0.61–1.24)
GFR calc Af Amer: 45 mL/min — ABNORMAL LOW (ref >60)
GFR calc non Af Amer: 39 mL/min — ABNORMAL LOW (ref >60)
Glucose, Bld: 156 mg/dL — ABNORMAL HIGH (ref 70–99)
Potassium: 5 mmol/L (ref 3.5–5.1)
Sodium: 138 mmol/L (ref 135–145)

## 2019-11-18 NOTE — Progress Notes (Signed)
Inpatient Rehab Admissions Coordinator:   I have screened this Pt. For CIR candidacy at the recommendation of physical therapy. At this time, pt. Does not demonstrate medical necessity for CIR admit and has not yet been seen by OT. Will await OT note and further medical work up and request order for CIR consult if Pt. Appears to be an appropriate candidate.   Clemens Catholic, [place], [place] Admissions Coordinator  [phone] ([place]) [phone] (office)

## 2019-11-18 NOTE — Progress Notes (Signed)
  Date: 11/18/2019  Patient name: [name] [name]  Medical record number: [id number]  Date of birth: 1934/09/13   This patient's plan of care was discussed with the house staff. Please see Dr. Darcey Nora note for complete details. I concur with her findings.   Sid Falcon, MD 11/18/2019, 9:39 PM

## 2019-11-18 NOTE — Progress Notes (Signed)
Subjective: Patient was seen and evaluated at bedside this morning.  He is doing well. He had an episode of hypoxia around 4 AM today and required 2 L nasal O2.  Denies any shortness of breath.  No chest pain.  No bleeding.   Objective:  Vital signs in last 24 hours: Vitals:   11/17/19 2348 11/18/19 0400 11/18/19 0420 11/18/19 0449  BP: (!) 119/57   133/78  Pulse: 91   94  Resp:  (!) 24  (!) 22  Temp: 98.9 F (37.2 C)   98.2 F (36.8 C)  TempSrc:    Axillary  SpO2: 99% (!) 83% 91% 93%  Weight:      Height:       Constitutional: Well-developed and well-nourished. No acute distress.  HENT:  Head: Normocephalic and atraumatic.  Eyes: Conjunctivae are normal, EOM nl Cardiovascular: RRR, nl S1S2, no murmur, no Ander Respiratory: On 2 L nasal cannula, effort normal and breath sounds normal. No respiratory distress.  Mild wheezes, trace basilar crackles Neurological: Is alert and oriented x 3  Msk: 1+ pitting edema left lower extremity (improved)  Assessment/Plan:  Active Problems:   HTN (hypertension)   Diabetes mellitus, type 2 ([place])   UTI (urinary tract infection)   Morbid obesity ([place])   Pulmonary embolism ([place])  [name] [name] is a 85 y.o. male with hx stroke, type II diabetes mellitus, seizure disorder on keppra, CKD 3, BPH presenting with bilateral lower extremity weakness found to have DVT of left lower extremity and a PE on CTA in the R lung, no evidence of R heart strain. Currently saturating well on RA, anticoagulated with Xarelto.   Unprovoked left lower extremity DVT and PE: No heart right heart strain. Has been treated with IV heparin for 2 days with subtherapeutic level and switched to Xarelto yesterday 11/17/2019. Shortness of breath has been improved.  He had an episode of hypoxia at 4 AM today that prior to liters of nasal cannula. Denies any shortness of breath or chest pain.   No bleeding on Xarelto.  -Continue Xarelto 15 mg twice daily for 2 weeks then 20  mg daily likely indefinite given unprovoked DVT and PE -Follow-up PT OT eval for dispo plan -Follow-up with PCP after discharge  Addendum: O2 saturation decreased to 89% and patient required 3 L nasal cannula.  He improved with breathing treatment.  I evaluated patient at bedside again.  He saturating around 92% when has a good wave on pulse ox.  I tried him off of oxygen for a few minutes and his oxygen saturation remained stable.  We will monitor his oxygen saturation.  -Supplemental oxygen to keep O2 saturation 92% or more -Continue breathing treatment  UTI/UC positive for E. Coli: Patient received one dose of IV ceftriaxone, 2 days of cefdinir. (Today is day four of antibiotic) -Continue cefdinir today and after discharge -Repeat UA at 2 weeks follow-up visit  Bilateral onychomycosis of feet and left toes fusion: -Podiatry evaluated patient yesterday and recommended outpatient follow-up.  AKI on CKD: Resolved with IV fluid. Creatinine now is 1.63 which is around his baseline. Of note, patient potassium this morning is 5 but probably hemolysis hemolysis  Chronic problems: DM/seizure/history of stroke/GERD/BPH: Stable  -Continue SSI and glargine 15 units daily -Continue Keppra -Continue PPI -Continue tamsulosin  Prior to Admission Living Arrangement: Home Anticipated Discharge Location: Pending PT OT eval/likely SNF Barriers to Discharge: Oxygen requirement today, PT OT eval Dispo: Anticipated discharge in approximately 1-3 days  Dewayne Hatch, MD 11/18/2019, 6:29 AM Pager: [phone] After 5pm on weekdays and 1pm on weekends: On Call pager [phone]

## 2019-11-18 NOTE — Evaluation (Signed)
Physical Therapy Evaluation Patient Details Name: [name] [name] MRN: [id number] DOB: 11-19-1934 Today's Date: 11/18/2019   History of Present Illness  Mr Jahbari Repinski is an 85 year old male with PMHx of left MCA and occipital embolic stroke without residual deficits, type II diabetes mellitus, seizures on keppra, endorses that he was in his usual state of health until earlier this evening when he endorsed feeling weakness in his bilateral lower extremities.  Work up includes R Lung PE seen on CTA and AKI  Clinical Impression  Pt admitted with/for Bil LE weakness, PE.  Pt mobilizing and a light moderate assist level with laborious movement at this time..  Pt currently limited functionally due to the problems listed. ( See problems list.)   Pt will benefit from PT to maximize function and safety in order to get ready for next venue listed below.     Follow Up Recommendations CIR    Equipment Recommendations  Other (comment) (TBA)    Recommendations for Other Services Rehab consult     Precautions / Restrictions Precautions Precautions: Fall      Mobility  Bed Mobility Overal bed mobility: Needs Assistance Bed Mobility: Supine to Sit;Sit to Supine     Supine to sit: Mod assist;HOB elevated Sit to supine: Mod assist   General bed mobility comments: assist LE's to position for assisted bridging, truncal assist up and forward via R elbow.  Stability assist for support to help pt scoot.  Assist at R UE to position to help with scoot.  Transfers Overall transfer level: Needs assistance Equipment used: Rolling walker (2 wheeled) Transfers: Sit to/from Stand Sit to Stand: Min assist         General transfer comment: cues for hand placement assist to come forward, stability while pt powers up.  Ambulation/Gait Ambulation/Gait assistance: Min assist Gait Distance (Feet): 3 Feet Assistive device: Rolling walker (2 wheeled) Gait Pattern/deviations: Step-to pattern   Gait velocity  interpretation: <1.8 ft/sec, indicate of risk for recurrent falls General Gait Details: side stepping up toward [facility] with stability assist.  Stairs            Wheelchair Mobility    Modified Rankin (Stroke Patients Only)       Balance Overall balance assessment: Mild deficits observed, not formally tested                                           Pertinent Vitals/Pain Pain Assessment: No/denies pain    Home Living Family/patient expects to be discharged to:: Private residence Living Arrangements: Spouse/significant other;Children Available Help at Discharge: Family;Available 24 hours/day;Other (Comment) (wife and son are blind, not good physical A, but okay gopher) Type of Home: House Home Access: Stairs to enter Entrance Stairs-Rails: Psychiatric nurse of Steps: 7 Home Layout: One level Home Equipment: Walker - 4 wheels      Prior Function Level of Independence: Independent with assistive device(s)         Comments: mobility has gotten laborious, pt was walking to the toilet for stools, but using urinal for convenience.  Pt did not make unnecessary travels around the home.     Hand Dominance        Extremity/Trunk Assessment   Upper Extremity Assessment Upper Extremity Assessment: Defer to OT evaluation    Lower Extremity Assessment Lower Extremity Assessment: Generalized weakness;RLE deficits/detail;LLE deficits/detail RLE Deficits / Details: stiff,  swollen, grossly 3+/5 strength, pt able to isolate motions. RLE Sensation: WNL RLE Coordination: decreased fine motor;decreased gross motor LLE Deficits / Details: stiff, knee pain, swollen, grossly >=3+5 LLE Coordination: decreased fine motor       Communication   Communication: No difficulties  Cognition Arousal/Alertness: Awake/alert Behavior During Therapy: WFL for tasks assessed/performed Overall Cognitive Status:  (NT formally, pt followed commands well, )                                         General Comments General comments (skin integrity, edema, etc.): sats on 2L [place] running between 89 and 93% with good waveform.  At times the SpO2 dropped to 82% with HR up to as much as 203 for sustained periods, but the waveform was poor and pt was asymptommatic    Exercises Other Exercises Other Exercises: warm up ROM exercise to Bil LE's   Assessment/Plan    PT Assessment Patient needs continued PT services  PT Problem List Decreased strength;Decreased activity tolerance;Decreased balance;Decreased mobility;Decreased knowledge of use of DME;Cardiopulmonary status limiting activity       PT Treatment Interventions Gait training;Functional mobility training;Therapeutic activities;Balance training;Patient/family education    PT Goals (Current goals can be found in the Care Plan section)  Acute Rehab PT Goals Patient Stated Goal: be able to do for myself and help my family out a bit. PT Goal Formulation: With patient Time For Goal Achievement: 12/02/19 Potential to Achieve Goals: Good    Frequency Min 3X/week   Barriers to discharge Decreased caregiver support (both family members are blind, they are independent in home)      Co-evaluation               AM-PAC PT "6 Clicks" Mobility  Outcome Measure Help needed turning from your back to your side while in a flat bed without using bedrails?: A Lot Help needed moving from lying on your back to sitting on the side of a flat bed without using bedrails?: A Lot Help needed moving to and from a bed to a chair (including a wheelchair)?: A Lot Help needed standing up from a chair using your arms (e.g., wheelchair or bedside chair)?: A Little Help needed to walk in hospital room?: A Little Help needed climbing 3-5 steps with a railing? : A Little 6 Click Score: 15    End of Session Equipment Utilized During Treatment: Oxygen Activity Tolerance: Patient tolerated treatment  well Patient left: in bed;with call bell/phone within reach;with bed alarm set Nurse Communication: Mobility status PT Visit Diagnosis: Unsteadiness on feet (R26.81);Other abnormalities of gait and mobility (R26.89);Difficulty in walking, not elsewhere classified (R26.2);Other symptoms and signs involving the nervous system (R29.898)    Time: 3329-5188 PT Time Calculation (min) (ACUTE ONLY): 48 min   Charges:   PT Evaluation $PT Eval Moderate Complexity: 1 Mod PT Treatments $Gait Training: 8-22 mins $Therapeutic Activity: 8-22 mins        11/18/2019  Ginger Carne., PT Acute Rehabilitation Services [phone]  (pager) [phone]  (office)  Tessie Fass Tierany Appleby 11/18/2019, 3:28 PM

## 2019-11-19 DIAGNOSIS — I130 Hypertensive heart and chronic kidney disease with heart failure and stage 1 through stage 4 chronic kidney disease, or unspecified chronic kidney disease: Secondary | ICD-10-CM

## 2019-11-19 DIAGNOSIS — N390 Urinary tract infection, site not specified: Secondary | ICD-10-CM

## 2019-11-19 DIAGNOSIS — G40909 Epilepsy, unspecified, not intractable, without status epilepticus: Secondary | ICD-10-CM

## 2019-11-19 DIAGNOSIS — N179 Acute kidney failure, unspecified: Secondary | ICD-10-CM

## 2019-11-19 DIAGNOSIS — B351 Tinea unguium: Secondary | ICD-10-CM

## 2019-11-19 DIAGNOSIS — B9620 Unspecified Escherichia coli [E. coli] as the cause of diseases classified elsewhere: Secondary | ICD-10-CM

## 2019-11-19 DIAGNOSIS — N400 Enlarged prostate without lower urinary tract symptoms: Secondary | ICD-10-CM

## 2019-11-19 DIAGNOSIS — N183 Chronic kidney disease, stage 3 unspecified: Secondary | ICD-10-CM

## 2019-11-19 DIAGNOSIS — I82402 Acute embolism and thrombosis of unspecified deep veins of left lower extremity: Secondary | ICD-10-CM

## 2019-11-19 DIAGNOSIS — I5030 Unspecified diastolic (congestive) heart failure: Secondary | ICD-10-CM

## 2019-11-19 DIAGNOSIS — I2699 Other pulmonary embolism without acute cor pulmonale: Principal | ICD-10-CM

## 2019-11-19 DIAGNOSIS — R0601 Orthopnea: Secondary | ICD-10-CM

## 2019-11-19 LAB — CBC
HCT: 42.6 % (ref 39.0–52.0)
Hemoglobin: 14 g/dL (ref 13.0–17.0)
MCH: 32.2 pg (ref 26.0–34.0)
MCHC: 32.9 g/dL (ref 30.0–36.0)
MCV: 97.9 fL (ref 80.0–100.0)
Platelets: 149 10*3/uL — ABNORMAL LOW (ref 150–400)
RBC: 4.35 MIL/uL (ref 4.22–5.81)
RDW: 12.3 % (ref 11.5–15.5)
WBC: 6.6 10*3/uL (ref 4.0–10.5)
nRBC: 0 % (ref 0.0–0.2)

## 2019-11-19 LAB — BASIC METABOLIC PANEL
Anion gap: 8 (ref 5–15)
BUN: 23 mg/dL (ref 8–23)
CO2: 27 mmol/L (ref 22–32)
Calcium: 8.6 mg/dL — ABNORMAL LOW (ref 8.9–10.3)
Chloride: 104 mmol/L (ref 98–111)
Creatinine, Ser: 1.62 mg/dL — ABNORMAL HIGH (ref 0.61–1.24)
GFR calc Af Amer: 45 mL/min — ABNORMAL LOW (ref >60)
GFR calc non Af Amer: 39 mL/min — ABNORMAL LOW (ref >60)
Glucose, Bld: 160 mg/dL — ABNORMAL HIGH (ref 70–99)
Potassium: 4.3 mmol/L (ref 3.5–5.1)
Sodium: 139 mmol/L (ref 135–145)

## 2019-11-19 LAB — GLUCOSE, CAPILLARY
Glucose-Capillary: 156 mg/dL — ABNORMAL HIGH (ref 70–99)
Glucose-Capillary: 217 mg/dL — ABNORMAL HIGH (ref 70–99)
Glucose-Capillary: 241 mg/dL — ABNORMAL HIGH (ref 70–99)
Glucose-Capillary: 242 mg/dL — ABNORMAL HIGH (ref 70–99)

## 2019-11-19 MED ORDER — FUROSEMIDE 10 MG/ML IJ SOLN
20.0000 mg | Freq: Once | INTRAMUSCULAR | Status: AC
  Administered 2019-11-19: 20 mg via INTRAVENOUS
  Filled 2019-11-19: qty 2

## 2019-11-19 NOTE — Evaluation (Signed)
Occupational Therapy Evaluation Patient Details Name: [name] [name] MRN: [id number] DOB: 1934-03-22 Today's Date: 11/19/2019    History of Present Illness [name] is an 85 year old male with PMHx of left MCA and occipital embolic stroke without residual deficits, type II diabetes mellitus, seizures on keppra, endorses that he was in his usual state of health until earlier this evening when he endorsed feeling weakness in his bilateral lower extremities.  Work up includes R Lung PE seen on CTA and AKI   Clinical Impression   Pt admitted with above. He demonstrates the below listed deficits and will benefit from continued OT to maximize safety and independence with BADLs.  Pt presents to OT with generalized weakness, impaired balance, decreased activity tolerance.  He requires min - mod A for ADLs and min - mod A for functional mobility.  He reports he lives with his wife, who is in poor help and can only assist him minimally.  He will benefit from post acute rehab, but is adamant that he does not want to go to SNF.  Recommend CIR level rehab.  02 sats remained >92% on 2L supplemental 02       Follow Up Recommendations  CIR;Supervision/Assistance - 24 hour    Equipment Recommendations  None recommended by OT    Recommendations for Other Services Rehab consult     Precautions / Restrictions Precautions Precautions: Fall      Mobility Bed Mobility Overal bed mobility: Needs Assistance Bed Mobility: Supine to Sit     Supine to sit: Mod assist     General bed mobility comments: assist to move LEs off the bed and assist to lift trunk   Transfers Overall transfer level: Needs assistance Equipment used: Rolling walker (2 wheeled) Transfers: Sit to/from Stand Sit to Stand: Min assist         General transfer comment: min A to boost into standing     Balance Overall balance assessment: Needs assistance Sitting-balance support: Feet supported;No upper extremity  supported Sitting balance-Leahy Scale: Fair Sitting balance - Comments: able to maintain static sitting balance mod I    Standing balance support: Single extremity supported;Bilateral upper extremity supported Standing balance-Leahy Scale: Poor Standing balance comment: requires UE support                            ADL either performed or assessed with clinical judgement   ADL Overall ADL's : Needs assistance/impaired Eating/Feeding: Set up;Sitting   Grooming: Wash/dry hands;Wash/dry face;Oral care;Minimal assistance;Standing   Upper Body Bathing: Minimal assistance;Sitting   Lower Body Bathing: Moderate assistance;Sit to/from stand   Upper Body Dressing : Minimal assistance;Sitting   Lower Body Dressing: Moderate assistance;Sit to/from stand   Toilet Transfer: Minimal assistance;Stand-pivot;BSC;RW   Toileting- Clothing Manipulation and Hygiene: Moderate assistance;Sit to/from stand       Functional mobility during ADLs: Moderate assistance General ADL Comments: Pt fatigues with activity      Vision Baseline Vision/History: Wears glasses Wears Glasses: At all times Patient Visual Report: No change from baseline       Perception     Praxis Praxis Praxis tested?: Within functional limits    Pertinent Vitals/Pain Pain Assessment: No/denies pain     Hand Dominance Right   Extremity/Trunk Assessment Upper Extremity Assessment Upper Extremity Assessment: RUE deficits/detail;LUE deficits/detail RUE Deficits / Details: Pt demonstrates residual weakness from prior CVA.  He demonstrates ~70* shoulder flexion.  AROM elbow WFL, but with  a tremor, and demonstrates mass grasp and release of Rt hand.  Impaired FMC  RUE Coordination: decreased gross motor;decreased fine motor LUE Deficits / Details: Lt shoulder ROM limited due to arthritis per pt report    Lower Extremity Assessment Lower Extremity Assessment: Defer to PT evaluation   Cervical / Trunk  Assessment Cervical / Trunk Assessment: Other exceptions Cervical / Trunk Exceptions: trunk weakness noted    Communication Communication Communication: No difficulties   Cognition Arousal/Alertness: Awake/alert Behavior During Therapy: WFL for tasks assessed/performed Overall Cognitive Status: Within Functional Limits for tasks assessed                                 General Comments: Grossly WFL    General Comments  02 sats remained >92% with pt on 2L supplemental 02     Exercises     Shoulder Instructions      Home Living Family/patient expects to be discharged to:: Private residence Living Arrangements: Spouse/significant other;Children Available Help at Discharge: Family;Available 24 hours/day;Other (Comment) Type of Home: House Home Access: Stairs to enter [employer] of Steps: 7 Entrance Stairs-Rails: Right;Left Home Layout: One level     Bathroom Shower/Tub: Teacher, early years/pre: Standard     Home Equipment: Environmental consultant - 4 wheels;Grab bars - tub/shower   Additional Comments: Pt reports he lives with wife who is in poor health       Prior Functioning/Environment Level of Independence: Independent with assistive device(s)        Comments: Pt reports he was performing ADLs mod I, but with significant effort.  He used a can for a urinal, and would ambulate to BR one time/day to empty it.  He wears slip on shoes and assists his wife with IADls         OT Problem List: Decreased strength;Decreased activity tolerance;Impaired balance (sitting and/or standing);Decreased range of motion;Decreased safety awareness;Decreased knowledge of use of DME or AE;Cardiopulmonary status limiting activity;Obesity      OT Treatment/Interventions: Self-care/ADL training;Therapeutic exercise;Energy conservation;DME and/or AE instruction;Therapeutic activities;Patient/family education;Balance training    OT Goals(Current goals can be found in  the care plan section) Acute Rehab OT Goals Patient Stated Goal: I want to go to your rehab here to have the opportunity to get stronger  OT Goal Formulation: With patient Time For Goal Achievement: 12/03/19 Potential to Achieve Goals: Good ADL Goals Pt Will Perform Grooming: with supervision;standing Pt Will Perform Upper Body Bathing: with supervision;with set-up;sitting Pt Will Perform Lower Body Bathing: with min guard assist;with adaptive equipment;sit to/from stand Pt Will Perform Upper Body Dressing: with set-up;sitting Pt Will Perform Lower Body Dressing: with min assist;sit to/from stand Pt Will Transfer to Toilet: with min assist;ambulating;regular height toilet;bedside commode;grab bars Pt Will Perform Toileting - Clothing Manipulation and hygiene: with min assist;sit to/from stand Pt/caregiver will Perform Home Exercise Program: Increased strength;Increased ROM;With Supervision;With written HEP provided Additional ADL Goal #1: Pt will independently incorporate energy conservation techniques into ADL tasks  OT Frequency: Min 2X/week   Barriers to D/C: Decreased caregiver support  wife limited with how much she can assist him        Co-evaluation              AM-PAC OT "6 Clicks" Daily Activity     Outcome Measure Help from another person eating meals?: A Little Help from another person taking care of personal grooming?: A Little Help from another  person toileting, which includes using toliet, bedpan, or urinal?: A Lot Help from another person bathing (including washing, rinsing, drying)?: A Lot Help from another person to put on and taking off regular upper body clothing?: A Little Help from another person to put on and taking off regular lower body clothing?: A Lot 6 Click Score: 15   End of Session Equipment Utilized During Treatment: Rolling walker;Oxygen Nurse Communication: Mobility status  Activity Tolerance: Patient tolerated treatment well Patient left:  in bed;with call bell/phone within reach;with nursing/sitter in room;with bed alarm set  OT Visit Diagnosis: Unsteadiness on feet (R26.81)                Time: 8341-9622 OT Time Calculation (min): 27 min Charges:  OT General Charges $OT Visit: 1 Visit OT Evaluation $OT Eval Moderate Complexity: 1 Mod OT Treatments $Self Care/Home Management : 8-22 mins  Nilsa Nutting., OTR/L Acute Rehabilitation Services Pager [phone] Office [phone]   Lucille Passy M 11/19/2019, 2:11 PM

## 2019-11-19 NOTE — Progress Notes (Signed)
° °  Subjective: Patient was seen and evaluated at bedside on morning rounds.  He reports some shortness of breath today.  Reports that he feels more comfortable when he is in sitting position.  He agrees with the plan of going to inpatient rehab for short-term.   Objective:  Vital signs in last 24 hours: Vitals:   11/18/19 1914 11/19/19 0300 11/19/19 0600 11/19/19 0716  BP: (!) 144/83 138/84  133/67  Pulse: 98 84  90  Resp: 18 17  19   Temp: 98.6 F (37 C) 98.4 F (36.9 C)  97.8 F (36.6 C)  TempSrc: Oral Oral    SpO2: 92% 92%  93%  Weight:   122.6 kg   Height:       Constitutional: No acute distress.  Cardiovascular: RRR, nl S1S2, no murmur, 1+ [name] [name] Respiratory: Saturating 88% on 2 L nasal cannula, no respiratory distress. Has bibasilar crackles Neurological: Is alert and oriented x 3  Msk: 1+ pitting edema left lower extremity (improved)  Assessment/Plan:  Active Problems:   HTN (hypertension)   Diabetes mellitus, type 2 (HCC)   UTI (urinary tract infection)   Morbid obesity ([place])   Pulmonary embolism ([place])  [name] [name] is a 84 y.o. male with hx stroke, type II diabetes mellitus, seizure disorder on keppra, CKD 3, BPH presenting with bilateral lower extremity weakness found to have DVT of left lower extremity and a PE on CTA in the R lung, no evidence of R heart strain. Currently saturating well on RA, anticoagulated with Xarelto.   Unprovoked left lower extremity DVT and PE: No heart right heart strain.  -Continue Xarelto -Follow-up with PCP after discharge.  Age appropriate cancer screening for any underlying cancer that could have been  -He will likely need indefinite anticoagulation if no recognized provoking factor   Shortness of breath, orthopnea: hypoxia O2 sat 88% on 2 L (improved with 3 L nasal cannula) Patient has bibasilar crackles and mild bilateral lower extremity edema he has history of diastolic heart failure but no HFrEF.  Not on medications. -Will  give some Lasix today for symptoms relief and given mild volume overload -Continue breathing treatment -Strict intake output -Supplemental oxygen to keep oxygen more than 92% (creased O2 to 3li)  Dispo plan: Recommended CIR but per patient rehab team evaluation, patient is not a candidate for CIR yet.  Recommended OT evaluation. -Follow-up OT recommendation>CIR. Appreciate inpatient rehab re evaluation  UTI/UC positive for E. Coli: Completed 5 days of ABx. (Patient received one dose of IV ceftriaxone, 4 days of cefdinir.) No urinary symptoms.  -Repeat UA at 2 weeks follow-up visit  Bilateral onychomycosis of feet and left toes fusion: -Podiatry consulted and evaluate the patient.  Recommended outpatient follow-up -Podiatry evaluated patient yesterday and recommended outpatient follow-up.  AKI on CKD: Improved with IV fluid.  Creatinine stable at 1.62. Around his baseline with mild increase. Can be his new baseline. -Monitor BMP daily when in hospital  Chronic problems: DM/seizure/history of stroke/GERD/BPH: Stable  -Continue SSI and glargine 15 units daily -Continue Keppra -Continue PPI -Continue tamsulosin  Prior to Admission Living Arrangement: Home Anticipated Discharge Location: Pending OT eval and  Barriers to Discharge: Respiratory improvement Dispo: Anticipated discharge in approximately 1-3 days  Dewayne Hatch, MD 11/19/2019, 2:46 PM Pager: [phone] After 5pm on weekdays and 1pm on weekends: On Call pager [phone]

## 2019-11-20 LAB — CBC
HCT: 42.2 % (ref 39.0–52.0)
Hemoglobin: 13.9 g/dL (ref 13.0–17.0)
MCH: 31.4 pg (ref 26.0–34.0)
MCHC: 32.9 g/dL (ref 30.0–36.0)
MCV: 95.5 fL (ref 80.0–100.0)
Platelets: 271 10*3/uL (ref 150–400)
RBC: 4.42 MIL/uL (ref 4.22–5.81)
RDW: 12.3 % (ref 11.5–15.5)
WBC: 6 10*3/uL (ref 4.0–10.5)
nRBC: 0 % (ref 0.0–0.2)

## 2019-11-20 LAB — BASIC METABOLIC PANEL
Anion gap: 9 (ref 5–15)
BUN: 29 mg/dL — ABNORMAL HIGH (ref 8–23)
CO2: 25 mmol/L (ref 22–32)
Calcium: 8.4 mg/dL — ABNORMAL LOW (ref 8.9–10.3)
Chloride: 102 mmol/L (ref 98–111)
Creatinine, Ser: 1.82 mg/dL — ABNORMAL HIGH (ref 0.61–1.24)
GFR calc Af Amer: 39 mL/min — ABNORMAL LOW (ref >60)
GFR calc non Af Amer: 34 mL/min — ABNORMAL LOW (ref >60)
Glucose, Bld: 217 mg/dL — ABNORMAL HIGH (ref 70–99)
Potassium: 4.7 mmol/L (ref 3.5–5.1)
Sodium: 136 mmol/L (ref 135–145)

## 2019-11-20 LAB — BRAIN NATRIURETIC PEPTIDE: B Natriuretic Peptide: 41 pg/mL (ref 0.0–100.0)

## 2019-11-20 LAB — GLUCOSE, CAPILLARY
Glucose-Capillary: 172 mg/dL — ABNORMAL HIGH (ref 70–99)
Glucose-Capillary: 217 mg/dL — ABNORMAL HIGH (ref 70–99)
Glucose-Capillary: 218 mg/dL — ABNORMAL HIGH (ref 70–99)
Glucose-Capillary: 186 mg/dL — ABNORMAL HIGH (ref 70–99)

## 2019-11-20 MED ORDER — INSULIN GLARGINE 100 UNIT/ML ~~LOC~~ SOLN
21.0000 [IU] | Freq: Every day | SUBCUTANEOUS | Status: DC
  Administered 2019-11-20: 21 [IU] via SUBCUTANEOUS
  Filled 2019-11-20 (×2): qty 0.21

## 2019-11-20 NOTE — Progress Notes (Addendum)
Subjective:   Patient was seen and evaluated at bedside this morning. He feels okay, reports mild SOB at this time, better when sitting up. Reports good urine output after lasix. He feels his breathing is better. Denies chest pain or palpitation.   Objective:  Vital signs in last 24 hours: Vitals:   11/19/19 2302 11/20/19 0114 11/20/19 0310 11/20/19 0752  BP: (!) 118/57  132/68 138/67  Pulse: 69  87 88  Resp: 18  20   Temp: 98.4 F (36.9 C)  (!) 97.5 F (36.4 C) 98.4 F (36.9 C)  TempSrc: Axillary  Axillary   SpO2: 91%  93% 93%  Weight:  122.5 kg    Height:        Physical Exam Constitutional: no acute distress Head: atraumatic ENT: external ears normal Eyes: EOMI Cardiovascular: regular rate and rhythm, normal heart sounds, RLE with mild pitting edema, LLE without edema Pulmonary: effort normal, minimal crackles bilaterally  Abdominal: flat, nontender, no suprapubic tenderness, no rebound tenderness, bowel sounds normal  Musculoskeletal: lower extremity redness has resolved, swelling much improved compared to prior, ankle monitor on RLE Skin: warm and dry, severe onychomycosis bilaterally, multiple small areas of crusted blood between toes, Left 2nd and 3rd toes appear fused Neurological: alert, no focal deficit, pill rolling tremor Psychiatric: normal mood and affect  Assessment/Plan: [name] [name] is a 85 y.o. male with hx stroke, type II diabetes mellitus, seizure disorder on keppra, CKD 3, BPH presenting with bilateral lower extremity weakness found to have a PE on CTA in the R lung, no evidence of R heart strain. Currently requiring supplemental O2 via [place], anticoagulated with Xarelto. Pending CIR evaluation.  Active Problems:   HTN (hypertension)   Diabetes mellitus, type 2 (HCC)   Generalized weakness   UTI (urinary tract infection)   Morbid obesity (HCC)   Pulmonary embolism (HCC)  Unprovoked Pulmonary Embolism Unprovoked DVT of LLE Current saturation of  93-95% on 4L O2. CTA with "partially occlusive pulmonary emboli within the right upper lobe and posterior right lower lobe segmental and subsegmental arterial branches." Patients notes sedentary lifestyle, but otherwise unprovoked PE. Anticoagulated with Xarelto, also ASA for secondary stroke prevention. -continue Xarelto 15mg  BID until 9/23, then 20mg  daily -follow-up with PCP after discharge for age appropriate cancer screening -will likely need indefinite anticoagulation if no recognized provoking factor  SOB, orthopnea Saturating well on 4L currently, no desats in past day. Improvement in bibasilar crackles and LE edema with Lasix yesterday.  -strict I&Os -supplemental O2 to keep saturation >92% -if volume overloaded again, restart Lasix -chest physiotherapy, flutter valve, incentive spirometer  AKI on CKD III vs progression of CKD Creatinine on presentation of 1.95, baseline in 2019 of 1.6. Had improved to his baseline, but creatinine bump today to 1.82 after Lasix yesterday. -daily BMP -avoid nephrotoxic medications  Type II Diabetes A1c of 9.6. On Glimepiride and Jardiance at home. Blood sugars in 200s yesterday -increase Lantus to 21 - Novolog sliding scale    Acute simple cystitis Hematuria Culture with E coli susceptible to ceftriaxone and macrobid. -completed course of Cefdinir -repeat UA outpatient in 2 weeks  Onychomycosis, fusion of L toes 2 and 3 Evaluated by podiatry, who will follow up outpatient. - f/u with podiatry outpatient  History of Seizure Disorder No seizures noted this admission. - Continue home keppra 500 mg BID  History of BPH - Continue home flomax .4 mg daily  History of GERD - Protonix 40 mg QD ordered  Hx  stroke -ASA 81mg  daily -simvastatin 40mg   Diet: heart healthy IVF: n/a VTE: Xarelto Prior to Admission Living Arrangement: home Anticipated Discharge Location: CIR vs home Barriers to Discharge: oxygen requirement, CIR  evaluation Dispo: Anticipated discharge in approximately 1-2 day(s).   Andrew Au, MD 11/20/2019, 9:57 AM Pager: [phone] After 5pm on weekdays and 1pm on weekends: On Call pager [phone]

## 2019-11-20 NOTE — TOC Initial Note (Signed)
Transition of Care [facility]) - Initial/Assessment Note    Patient Details  Name: [name] [name] MRN: [id number] Date of Birth: [birth year]  Transition of Care [facility]) CM/SW Contact:    Pollie Friar, RN Phone Number: 11/20/2019, 4:28 PM  Clinical Narrative:                 CM spoke with patient over the phone about CIR not being an option and to see if he is willing to d/c to SNF for rehab. Pt states he doesn't feel his significant other can manage him currently and that he will need some strengthening. CM offered to call his SO but pt states she is at a cook out. Pt agreeable to being faxed out in the [facility] area.  TOC will f/u with pt tomorrow.  Expected Discharge Plan: Skilled Nursing Facility Barriers to Discharge: Continued Medical Work up   Patient Goals and CMS Choice   CMS Medicare.gov Compare Post Acute Care list provided to:: Patient Choice offered to / list presented to : Patient, Spouse  Expected Discharge Plan and Services Expected Discharge Plan: [place] Acute Care Choice: [place] Living arrangements for the past 2 months: Single Family Home                                      Prior Living Arrangements/Services Living arrangements for the past 2 months: Single Family Home Lives with:: Significant Other Patient language and need for interpreter reviewed:: Yes Do you feel safe going back to the place where you live?: Yes      Need for Family Participation in Patient Care: Yes (Comment) Care giver support system in place?: No (comment)   Criminal Activity/Legal Involvement Pertinent to Current Situation/Hospitalization: No - Comment as needed  Activities of Daily Living Home Assistive Devices/Equipment: Environmental consultant (specify type), Other (Comment), Eyeglasses (rolator ) ADL Screening (condition at time of admission) Patient's cognitive ability adequate to safely complete daily activities?: Yes Is the patient  deaf or have difficulty hearing?: No Does the patient have difficulty seeing, even when wearing glasses/contacts?: No Does the patient have difficulty concentrating, remembering, or making decisions?: No Patient able to express need for assistance with ADLs?: Yes Does the patient have difficulty dressing or bathing?: Yes Independently performs ADLs?: No Communication: Independent Dressing (OT): Needs assistance Is this a change from baseline?: Pre-admission baseline Grooming: Needs assistance Is this a change from baseline?: Pre-admission baseline Feeding: Needs assistance Is this a change from baseline?: Pre-admission baseline Bathing: Needs assistance Is this a change from baseline?: Pre-admission baseline Toileting: Needs assistance Is this a change from baseline?: Pre-admission baseline In/Out Bed: Needs assistance Is this a change from baseline?: Pre-admission baseline Walks in Home: Needs assistance Is this a change from baseline?: Pre-admission baseline Does the patient have difficulty walking or climbing stairs?: Yes Weakness of Legs: Both Weakness of Arms/Hands: Both  Permission Sought/Granted                  Emotional Assessment   Attitude/Demeanor/Rapport: Engaged Affect (typically observed): Accepting Orientation: : Oriented to Self, Oriented to Place, Oriented to  Time, Oriented to Situation   Psych Involvement: No (comment)  Admission diagnosis:  Pulmonary embolism (HCC) [I26.99] Hypoxia [R09.02] Acute cystitis with hematuria [N30.01] Generalized weakness [R53.1] Other acute pulmonary embolism without acute cor pulmonale (HCC) [I26.99] Patient Active Problem List  Diagnosis Date Noted  . Pulmonary embolism ([place]) 11/16/2019  . Congestion of nasal sinus   . Hyperlipidemia LDL goal <70   . Hypoalbuminemia due to protein-calorie malnutrition ([place])   . Benign prostatic hyperplasia   . Diabetes mellitus type 2 in obese ([place])   . Acute on chronic renal  failure ([place])   . Stage 3 chronic kidney disease   . Morbid obesity ([name])   . Acute ischemic left middle cerebral artery (MCA) stroke ([place]) 12/30/2017  . Cerebral infarction ([place])   . Cerebral embolism with cerebral infarction 12/20/2017  . CVA (cerebral vascular accident) ([place]) 12/20/2017  . Acute ischemic stroke ([place])   . History of CVA (cerebrovascular accident)   . Parkinson disease ([place])   . Diastolic dysfunction   . Hypokalemia   . Right sided weakness 12/18/2017  . Chronic venous insufficiency 05/05/2017  . .0... 12/21/2016  . Generalized weakness 12/21/2016  . Contracture of muscle of right upper arm 12/21/2016  . UTI (urinary tract infection) 12/21/2016  . Seizures ([place])   . Left knee pain 09/06/2015  . Hyperlipidemia associated with type 2 diabetes mellitus ([place]) 09/06/2015  . Tremor 09/06/2015  . Urinary frequency 09/06/2015  . Acute urinary retention 08/12/2015  . Hyperkalemia 08/10/2015  . Acute renal failure ([place]) 08/10/2015  . Acute bilateral obstructive uropathy 08/10/2015  . HTN (hypertension) 08/10/2015  . Diabetes mellitus, type 2 ([place]) 08/10/2015   PCP:  Clovia Cuff, MD Pharmacy:   [facility], [place] [address] Phone: [phone] Fax: [phone]  [id number] - [place], [place] [address] Phone: [phone] Fax: [phone]  [facility] [place] [place] - [address] [place] [address] Phone: [phone] Fax: [phone]     Social Determinants of Health (SDOH) Interventions    Readmission Risk Interventions No flowsheet data found.

## 2019-11-20 NOTE — Progress Notes (Addendum)
.  Inpatient Rehab Admissions Coordinator Note:   Per therapy recommendations, pt was screened for CIR candidacy by Clemens Catholic, [place] CCC-SLP. At this time, Pt. is ambulating with min assist  Is mod-min A with ADLS. Pt. does not demonstrate need for CIR-level therapies.or medical necessity for CIR.  Please contact me with any questions.    Clemens Catholic, [place], [place] Admissions Coordinator  [phone] ([place]) [phone] (office)

## 2019-11-20 NOTE — NC FL2 (Signed)
[place] LEVEL OF CARE SCREENING TOOL     IDENTIFICATION  Patient Name: [name] [name] Birthdate: 08/17/1934 Sex: male Admission Date (Current Location): 11/15/2019  [facility] and [place] Number:  Herbalist and Address:  [facility], [place] [address], [place], [place] [address]      Provider Number: [id number]  Attending Physician Name and Address:  Lucious Groves, DO  Relative Name and Phone Number:       Current Level of Care: Hospital Recommended Level of Care: [place] Prior Approval Number:    Date Approved/Denied:   PASRR Number:    Discharge Plan: SNF    Current Diagnoses: Patient Active Problem List   Diagnosis Date Noted   Pulmonary embolism ([place]) 11/16/2019   Congestion of nasal sinus    Hyperlipidemia LDL goal <70    Hypoalbuminemia due to protein-calorie malnutrition (HCC)    Benign prostatic hyperplasia    Diabetes mellitus type 2 in obese (HCC)    Acute on chronic renal failure (HCC)    Stage 3 chronic kidney disease    Morbid obesity ([place])    Acute ischemic left middle cerebral artery (MCA) stroke ([place]) 12/30/2017   Cerebral infarction ([place])    Cerebral embolism with cerebral infarction 12/20/2017   CVA (cerebral vascular accident) ([place]) 12/20/2017   Acute ischemic stroke (HCC)    History of CVA (cerebrovascular accident)    Parkinson disease ([place])    Diastolic dysfunction    Hypokalemia    Right sided weakness 12/18/2017   Chronic venous insufficiency 05/05/2017   .0... 12/21/2016   Generalized weakness 12/21/2016   Contracture of muscle of right upper arm 12/21/2016   UTI (urinary tract infection) 12/21/2016   Seizures ([place])    Left knee pain 09/06/2015   Hyperlipidemia associated with type 2 diabetes mellitus ([place]) 09/06/2015   Tremor 09/06/2015   Urinary frequency 09/06/2015   Acute urinary retention 08/12/2015   Hyperkalemia 08/10/2015   Acute  renal failure ([place]) 08/10/2015   Acute bilateral obstructive uropathy 08/10/2015   HTN (hypertension) 08/10/2015   Diabetes mellitus, type 2 ([place]) 08/10/2015    Orientation RESPIRATION BLADDER Height & Weight     Self, Time, Situation, Place  O2 (see d/c summary for oxygen needs) Incontinent Weight: 122.5 kg Height:  5\' 8"  (172.7 cm)  BEHAVIORAL SYMPTOMS/MOOD NEUROLOGICAL BOWEL NUTRITION STATUS    Convulsions/Seizures (keppra 500 mg BiD) Continent Diet (heart healthy/ carb modified with thin liquids)  AMBULATORY STATUS COMMUNICATION OF NEEDS Skin   Extensive Assist Verbally Other (Comment) (bilateral feet with cracking)                       Personal Care Assistance Level of Assistance  Bathing, Feeding, Dressing Bathing Assistance: Maximum assistance Feeding assistance: Limited assistance Dressing Assistance: Limited assistance     Functional Limitations Info  Sight, Hearing, Speech Sight Info: Impaired Hearing Info: Impaired Speech Info: Adequate    SPECIAL CARE FACTORS FREQUENCY  PT (By licensed PT), OT (By licensed OT)     PT Frequency: 5x/wk OT Frequency: 5x/wk            Contractures Contractures Info: Not present    Additional Factors Info  Code Status, Allergies, Insulin Sliding Scale Code Status Info: Full Allergies Info: nka   Insulin Sliding Scale Info: Lantus 21 units SQ daily/ Novolog 0-15 units SQ three times a day       Current Medications (11/20/2019):  This is the current hospital active medication list Current Facility-Administered Medications  Medication Dose Route Frequency Provider Last Rate Last Admin   acetaminophen (TYLENOL) tablet 650 mg  650 mg Oral Q6H PRN Harvie Heck, MD   650 mg at 11/19/19 2101   Or   acetaminophen (TYLENOL) suppository 650 mg  650 mg Rectal Q6H PRN Harvie Heck, MD       aspirin EC tablet 81 mg  81 mg Oral Daily Andrew Au, MD   81 mg at 11/20/19 0845   cefdinir (OMNICEF) capsule 300 mg  300 mg  Oral Q12H Andrew Au, MD   300 mg at 11/20/19 0841   insulin aspart (novoLOG) injection 0-15 Units  0-15 Units Subcutaneous TID WC Andrew Au, MD   5 Units at 11/20/19 1152   insulin glargine (LANTUS) injection 21 Units  21 Units Subcutaneous Daily Andrew Au, MD   21 Units at 11/20/19 0846   ipratropium-albuterol (DUONEB) 0.5-2.5 (3) MG/3ML nebulizer solution 3 mL  3 mL Nebulization Q4H PRN Aslam, Loralyn Freshwater, MD   3 mL at 11/18/19 1043   levETIRAcetam (KEPPRA) tablet 500 mg  500 mg Oral BID Aslam, Loralyn Freshwater, MD   500 mg at 11/20/19 0842   pantoprazole (PROTONIX) EC tablet 40 mg  40 mg Oral Daily Aslam, Loralyn Freshwater, MD   40 mg at 11/20/19 0849   Rivaroxaban (XARELTO) tablet 15 mg  15 mg Oral BID WC Andrew Au, MD   15 mg at 11/20/19 1610   Followed by   Derrill Memo ON 12/08/2019] rivaroxaban (XARELTO) tablet 20 mg  20 mg Oral Q supper Andrew Au, MD       simvastatin (ZOCOR) tablet 40 mg  40 mg Oral Daily Harvie Heck, MD   40 mg at 11/20/19 0841   tamsulosin (FLOMAX) capsule 0.4 mg  0.4 mg Oral QAC supper Harvie Heck, MD   0.4 mg at 11/19/19 1716     Discharge Medications: Please see discharge summary for a list of discharge medications.  Relevant Imaging Results:  Relevant Lab Results:   Additional Information SS#[id number]  Pollie Friar, RN

## 2019-11-21 LAB — BASIC METABOLIC PANEL
Anion gap: 9 (ref 5–15)
BUN: 23 mg/dL (ref 8–23)
CO2: 26 mmol/L (ref 22–32)
Calcium: 8.6 mg/dL — ABNORMAL LOW (ref 8.9–10.3)
Chloride: 105 mmol/L (ref 98–111)
Creatinine, Ser: 1.56 mg/dL — ABNORMAL HIGH (ref 0.61–1.24)
GFR calc Af Amer: 47 mL/min — ABNORMAL LOW (ref >60)
GFR calc non Af Amer: 41 mL/min — ABNORMAL LOW (ref >60)
Glucose, Bld: 132 mg/dL — ABNORMAL HIGH (ref 70–99)
Potassium: 3.9 mmol/L (ref 3.5–5.1)
Sodium: 140 mmol/L (ref 135–145)

## 2019-11-21 LAB — CULTURE, BLOOD (ROUTINE X 2)
Culture: NO GROWTH
Special Requests: ADEQUATE

## 2019-11-21 LAB — CBC
HCT: 44 % (ref 39.0–52.0)
Hemoglobin: 14.2 g/dL (ref 13.0–17.0)
MCH: 31.3 pg (ref 26.0–34.0)
MCHC: 32.3 g/dL (ref 30.0–36.0)
MCV: 96.9 fL (ref 80.0–100.0)
Platelets: 158 10*3/uL (ref 150–400)
RBC: 4.54 MIL/uL (ref 4.22–5.81)
RDW: 12.3 % (ref 11.5–15.5)
WBC: 6.4 10*3/uL (ref 4.0–10.5)
nRBC: 0 % (ref 0.0–0.2)

## 2019-11-21 LAB — GLUCOSE, CAPILLARY
Glucose-Capillary: 136 mg/dL — ABNORMAL HIGH (ref 70–99)
Glucose-Capillary: 195 mg/dL — ABNORMAL HIGH (ref 70–99)
Glucose-Capillary: 243 mg/dL — ABNORMAL HIGH (ref 70–99)
Glucose-Capillary: 247 mg/dL — ABNORMAL HIGH (ref 70–99)

## 2019-11-21 LAB — SARS CORONAVIRUS 2 (TAT 6-24 HRS): SARS Coronavirus 2: NEGATIVE

## 2019-11-21 MED ORDER — INSULIN GLARGINE 100 UNIT/ML ~~LOC~~ SOLN
23.0000 [IU] | Freq: Every day | SUBCUTANEOUS | Status: DC
  Administered 2019-11-21 – 2019-11-22 (×2): 23 [IU] via SUBCUTANEOUS
  Filled 2019-11-21 (×2): qty 0.23

## 2019-11-21 NOTE — Progress Notes (Signed)
Subjective:   Patient states that he feels very well this morning, denies any shortness of breath. Denies CP. Currently on room air. He is agreeable to discharge to SNF when bed is available.  Objective:  Vital signs in last 24 hours: Vitals:   11/20/19 2154 11/20/19 2317 11/21/19 0120 11/21/19 0320  BP:  (!) 112/49  121/73  Pulse:    80  Resp:  20  18  Temp:  97.7 F (36.5 C)  97.8 F (36.6 C)  TempSrc:  Axillary  Axillary  SpO2:  98%  97%  Weight: 120.8 kg  120.8 kg   Height:        Physical Exam Constitutional: no acute distress Head: atraumatic ENT: external ears normal Eyes: EOMI Cardiovascular: regular rate and rhythm, normal heart sounds, no pitting edema in bilatearl LE Pulmonary: effort normal, clear to ascultation bilaterally Abdominal: flat, nontender, no suprapubic tenderness, no rebound tenderness, bowel sounds normal  Musculoskeletal: lower extremity redness has resolved, swelling much improved compared to prior, ankle monitor on RLE Skin: warm and dry, socks on feet Neurological: alert, no focal deficit, pill rolling tremor Psychiatric: normal mood and affect  Assessment/Plan: [name] [name] is a 85 y.o. male with hx stroke, type II diabetes mellitus, seizure disorder on keppra, CKD 3, BPH presenting with bilateral lower extremity weakness found to have a PE on CTA in the R lung, no evidence of R heart strain. Saturating well on room air currently. Anticoagulated with Xarelto. Pending SNF placement.  Active Problems:   HTN (hypertension)   Diabetes mellitus, type 2 (HCC)   Generalized weakness   UTI (urinary tract infection)   Morbid obesity ([place])   Pulmonary embolism (HCC)  Unprovoked Pulmonary Embolism Unprovoked DVT of LLE Currently on room air. CTA with "partially occlusive pulmonary emboli within the right upper lobe and posterior right lower lobe segmental and subsegmental arterial branches." Patients notes sedentary lifestyle, but otherwise  unprovoked PE. Anticoagulated with Xarelto, also ASA for secondary stroke prevention. -continue Xarelto 15mg  BID until 9/23, then 20mg  daily -follow-up with PCP after discharge for age appropriate cancer screening -will likely need indefinite anticoagulation if no recognized provoking factor  SOB, orthopnea Oxygen requirement improving. This morning, he has saturation of 91% at rest, improved to 95% when he took deep breaths while moving. Yesterday's crackles and LE edema are resolved without further Lasix. -strict I&Os -flutter valve, incentive spirometer  Type II Diabetes A1c of 9.6. On Glimepiride and Jardiance at home. Blood sugars in 200s yesterday -increase Lantus to 24 -Novolog sliding scale   AKI on CKD III vs progression of CKD Creatinine on presentation of 1.95, baseline in 2019 of 1.6. Now back to his baseline. -daily BMP -avoid nephrotoxic medications  Acute simple cystitis Hematuria Culture with E coli susceptible to ceftriaxone and macrobid. -completed course of Cefdinir -repeat UA outpatient in 2 weeks  Onychomycosis, fusion of L toes 2 and 3 Evaluated by podiatry, who will follow up outpatient. - f/u with podiatry outpatient  History of Seizure Disorder No seizures noted this admission. - Continue home keppra 500 mg BID  History of BPH - Continue home flomax .4 mg daily  History of GERD - Protonix 40 mg QD ordered  Hx stroke -ASA 81mg  daily, along with his Xarelto -simvastatin 40mg   Diet: heart healthy IVF: n/a VTE: Xarelto Prior to Admission Living Arrangement: home Anticipated Discharge Location: SNF Barriers to Discharge: SNF placement, patient medically stable for discharge Dispo: Anticipated discharge in approximately 0-1 day(s).  Andrew Au, MD 11/21/2019, 6:17 AM Pager: [phone] After 5pm on weekdays and 1pm on weekends: On Call pager [phone]

## 2019-11-21 NOTE — Progress Notes (Signed)
Physical Therapy Treatment Patient Details Name: [name] [name] MRN: [id number] DOB: 05-Feb-1935 Today's Date: 11/21/2019    History of Present Illness [name] is an 85 year old male with PMHx of left MCA and occipital embolic stroke without residual deficits, type II diabetes mellitus, seizures on keppra, endorses that he was in his usual state of health until earlier this evening when he endorsed feeling weakness in his bilateral lower extremities.  Work up includes R Lung PE seen on CTA and AKI    PT Comments    Pt demonstrating mobility progression today, ambulating room distance with use of RW and min assist to steady. Pt with stool incontinence during gait, requiring pericare assist as pt states he cannot do it himself. Pt remains weak and high fall risk, CIR denied pt so PT recommending SNF as secondary option. Will continue to follow acutely.  SpO2 >=92% on RA    Follow Up Recommendations  SNF;Supervision/Assistance - 24 hour     Equipment Recommendations  Other (comment) (TBA)    Recommendations for Other Services       Precautions / Restrictions Precautions Precautions: Fall Restrictions Weight Bearing Restrictions: No    Mobility  Bed Mobility Overal bed mobility: Needs Assistance Bed Mobility: Supine to Sit;Sit to Supine     Supine to sit: Mod assist;HOB elevated Sit to supine: Min assist;HOB elevated   General bed mobility comments: Mod assist for supine to sit for trunk elevation and scooting to EOB, min assist for return to supine for LE lifting into bed and min boost assist in trendelenburg position.  Transfers Overall transfer level: Needs assistance Equipment used: Rolling walker (2 wheeled) Transfers: Sit to/from Stand Sit to Stand: Min assist;From elevated surface         General transfer comment: min assist for initial power up, steadying upon standing. Verbal cuing for hand placement when rising/sitting, x2 from EOB and  toilet.  Ambulation/Gait Ambulation/Gait assistance: Min assist Gait Distance (Feet): 10 Feet (2x10) Assistive device: Rolling walker (2 wheeled) Gait Pattern/deviations: Step-through pattern;Decreased stride length;Trunk flexed Gait velocity: decr   General Gait Details: min gaurd for safety, with occasional min assist to steady and lines/leads. Verbal cuing for upright posture, placement in RW. Stool incontinence during gait.   Stairs             Wheelchair Mobility    Modified Rankin (Stroke Patients Only)       Balance Overall balance assessment: Needs assistance Sitting-balance support: Feet supported;No upper extremity supported Sitting balance-Leahy Scale: Fair     Standing balance support: Single extremity supported;Bilateral upper extremity supported Standing balance-Leahy Scale: Poor Standing balance comment: requires UE support                             Cognition Arousal/Alertness: Awake/alert Behavior During Therapy: WFL for tasks assessed/performed Overall Cognitive Status: Within Functional Limits for tasks assessed                                 General Comments: Grossly WFL       Exercises Other Exercises Other Exercises: PT encouraged bed-level LE exercises (quad sets, ankle pumps) thorughout the day    General Comments General comments (skin integrity, edema, etc.): SpO2 92% and greater on RA during session, HRmax 118 bpm      Pertinent Vitals/Pain Pain Assessment: No/denies pain  Home Living                      Prior Function            PT Goals (current goals can now be found in the care plan section) Acute Rehab PT Goals Patient Stated Goal: go to rehab to get stronger PT Goal Formulation: With patient Time For Goal Achievement: 12/02/19 Potential to Achieve Goals: Good Progress towards PT goals: Progressing toward goals    Frequency    Min 3X/week      PT Plan Discharge plan  needs to be updated    Co-evaluation              AM-PAC PT "6 Clicks" Mobility   Outcome Measure  Help needed turning from your back to your side while in a flat bed without using bedrails?: A Little Help needed moving from lying on your back to sitting on the side of a flat bed without using bedrails?: A Lot Help needed moving to and from a bed to a chair (including a wheelchair)?: A Little Help needed standing up from a chair using your arms ([name].g., wheelchair or bedside chair)?: A Little Help needed to walk in hospital room?: A Little Help needed climbing 3-5 steps with a railing? : A Lot 6 Click Score: 16    End of Session Equipment Utilized During Treatment: Gait belt Activity Tolerance: Patient tolerated treatment well Patient left: in bed;with call bell/phone within reach;with bed alarm set Nurse Communication: Mobility status PT Visit Diagnosis: Unsteadiness on feet (R26.81);Other abnormalities of gait and mobility (R26.89);Difficulty in walking, not elsewhere classified (R26.2)     Time: 5189-8421 PT Time Calculation (min) (ACUTE ONLY): 32 min  Charges:  $Gait Training: 8-22 mins $Therapeutic Activity: 8-22 mins                     [name] [name], PT Acute Rehabilitation Services Pager [phone]  Office [phone]   [name] [name] 11/21/2019, 10:59 AM

## 2019-11-21 NOTE — TOC Progression Note (Addendum)
Transition of Care [facility]) - Progression Note    Patient Details  Name: [name] [name] MRN: [id number] Date of Birth: [birth year]  Transition of Care [facility]) CM/SW [place], RN Phone Number: [phone]  11/21/2019, 2:29 PM  Clinical Narrative:    List of bed offers presented to the patient and patient has decided on [facility]. [facility] does not have any beds available. Patient made aware and patient choose [facility]. Spoke with Chantell at [facility] who confirms that the facility can make a bed offer. Insurance authorization initiated. Clinicals faxed Auth ref # [id number]. Message sent to MD requesting covid test. Facility has been made aware and plans for am discharge pending covid test. CM will continue to follow  [place] called with Insurance auth approval . Reference [id number] for [facility] with start date of 11/22/19 and next review on 11/24/19.  Expected Discharge Plan: [place] Barriers to Discharge: Continued Medical Work up  Expected Discharge Plan and Services Expected Discharge Plan: [place] Choice: [place] arrangements for the past 2 months: Single Family Home                                       Social Determinants of Health (SDOH) Interventions    Readmission Risk Interventions No flowsheet data found.

## 2019-11-22 DIAGNOSIS — B351 Tinea unguium: Secondary | ICD-10-CM

## 2019-11-22 LAB — CBC
HCT: 43.5 % (ref 39.0–52.0)
Hemoglobin: 14 g/dL (ref 13.0–17.0)
MCH: 31 pg (ref 26.0–34.0)
MCHC: 32.2 g/dL (ref 30.0–36.0)
MCV: 96.5 fL (ref 80.0–100.0)
Platelets: 157 10*3/uL (ref 150–400)
RBC: 4.51 MIL/uL (ref 4.22–5.81)
RDW: 12.4 % (ref 11.5–15.5)
WBC: 6.3 10*3/uL (ref 4.0–10.5)
nRBC: 0 % (ref 0.0–0.2)

## 2019-11-22 LAB — BASIC METABOLIC PANEL
Anion gap: 7 (ref 5–15)
BUN: 24 mg/dL — ABNORMAL HIGH (ref 8–23)
CO2: 27 mmol/L (ref 22–32)
Calcium: 8.6 mg/dL — ABNORMAL LOW (ref 8.9–10.3)
Chloride: 102 mmol/L (ref 98–111)
Creatinine, Ser: 1.56 mg/dL — ABNORMAL HIGH (ref 0.61–1.24)
GFR calc Af Amer: 47 mL/min — ABNORMAL LOW (ref >60)
GFR calc non Af Amer: 41 mL/min — ABNORMAL LOW (ref >60)
Glucose, Bld: 172 mg/dL — ABNORMAL HIGH (ref 70–99)
Potassium: 4.2 mmol/L (ref 3.5–5.1)
Sodium: 136 mmol/L (ref 135–145)

## 2019-11-22 LAB — GLUCOSE, CAPILLARY
Glucose-Capillary: 156 mg/dL — ABNORMAL HIGH (ref 70–99)
Glucose-Capillary: 231 mg/dL — ABNORMAL HIGH (ref 70–99)

## 2019-11-22 MED ORDER — RIVAROXABAN (XARELTO) VTE STARTER PACK (15 & 20 MG): ORAL_TABLET | ORAL | 0 refills | Status: AC

## 2019-11-22 MED ORDER — ASPIRIN 81 MG PO TBEC: 81.0000 mg | DELAYED_RELEASE_TABLET | Freq: Every day | ORAL | 11 refills | Status: AC

## 2019-11-22 NOTE — Plan of Care (Signed)

## 2019-11-22 NOTE — TOC Transition Note (Signed)
Transition of Care [facility]) - CM/SW Discharge Note   Patient Details  Name: [name] [name] MRN: [id number] Date of Birth: [birth year]  Transition of Care [facility]) CM/SW Contact:  Angelita Ingles, RN Phone Number: [phone]  11/22/2019, 11:41 AM   Clinical Narrative:    Patient to be discharged to [facility]. Discharge summary has been faxed. Transportation has been arranged via [place]. Discharge packet placed at the nurses station.   Please call report to  [phone] Rm# [id number]   Final next level of care: Skilled Nursing Facility Barriers to Discharge: No Barriers Identified   Patient Goals and CMS Choice Patient states their goals for this hospitalization and ongoing recovery are:: Ready to get better and get home CMS Medicare.gov Compare Post Acute Care list provided to:: Patient Choice offered to / list presented to : Patient  Discharge Placement                Patient to be transferred to facility by: [place] Name of family member notified: Patient does not want family notified    Discharge Plan and Services     Post Acute Care Choice: [place]          DME Arranged: N/A DME Agency: NA       HH Arranged: NA HH Agency: NA        Social Determinants of Health (SDOH) Interventions     Readmission Risk Interventions No flowsheet data found.

## 2019-11-22 NOTE — Progress Notes (Addendum)
   Subjective:   Patient states that he feels well this morning, denies SOB or CP. Continued mild coughing. Agreeable with discharge to SNF today.  Objective:  Vital signs in last 24 hours: Vitals:   11/21/19 2225 11/21/19 2301 11/22/19 0102 11/22/19 0316  BP:  (!) 144/64  135/66  Pulse:  87  84  Resp:  18  20  Temp:  98.4 F (36.9 C)  97.7 F (36.5 C)  TempSrc:  Axillary  Axillary  SpO2:  100%  97%  Weight: 120.6 kg  120.6 kg   Height:        Physical Exam Constitutional: no acute distress Head: atraumatic ENT: external ears normal Eyes: EOMI Cardiovascular: regular rate and rhythm, normal heart sounds, no pitting edema in bilatearl LE Pulmonary: effort normal, clear to ascultation bilaterally Abdominal: flat, bowel sounds normal  Musculoskeletal: lower extremity redness has resolved, swelling much improved compared to prior, ankle monitor on RLE Skin: warm and dry, socks on feet Neurological: alert, no focal deficit, pill rolling tremor Psychiatric: normal mood and affect  Assessment/Plan: [name] [name] is a 85 y.o. male with hx stroke, type II diabetes mellitus, seizure disorder on keppra, CKD 3, BPH presenting with bilateral lower extremity weakness found to have a PE on CTA in the R lung, no evidence of R heart strain. Saturating well on room air currently. Anticoagulated with Xarelto. Pending SNF placement.  Active Problems:   HTN (hypertension)   Diabetes mellitus, type 2 (HCC)   Generalized weakness   UTI (urinary tract infection)   Morbid obesity ([place])   Pulmonary embolism (HCC)  Unprovoked Pulmonary Embolism Unprovoked DVT of LLE Currently on room air. CTA with "partially occlusive pulmonary emboli within the right upper lobe and posterior right lower lobe segmental and subsegmental arterial branches." Patients notes sedentary lifestyle, but otherwise unprovoked PE. Anticoagulated with Xarelto, also ASA for secondary stroke prevention. -continue Xarelto 15mg   BID until 9/23, then 20mg  daily -follow-up with PCP after discharge for age appropriate cancer screening -will likely need indefinite anticoagulation if no recognized provoking factor  SOB, orthopnea Oxygen requirement improving. This morning, he has saturation of 91% at rest, improved to 95% when he took deep breaths with IS.  -strict I&Os -flutter valve, incentive spirometer  Type II Diabetes A1c of 9.6. On Glimepiride and Jardiance at home. Blood sugars in 180s-200s -continue Lantus to 23 -Novolog sliding scale   AKI on CKD III vs progression of CKD Creatinine on presentation of 1.95, baseline in 2019 of 1.6. Now back to his baseline. -daily BMP -avoid nephrotoxic medications  Acute simple cystitis Hematuria Culture with E coli susceptible to ceftriaxone and macrobid. -completed course of Cefdinir -repeat UA outpatient in 2 weeks  Onychomycosis, fusion of L toes 2 and 3 Evaluated by podiatry, who will follow up outpatient. - f/u with podiatry outpatient  History of Seizure Disorder No seizures noted this admission. - Continue home keppra 500 mg BID  History of BPH - Continue home flomax .4 mg daily  History of GERD - Protonix 40 mg QD ordered  Hx stroke -ASA 81mg  daily, along with his Xarelto -simvastatin 40mg   Diet: heart healthy IVF: n/a VTE: Xarelto Prior to Admission Living Arrangement: home Anticipated Discharge Location: SNF Barriers to Discharge: SNF placement, patient medically stable for discharge Dispo: Anticipated discharge in approximately 0-1 day(s).   Andrew Au, MD 11/22/2019, 6:19 AM Pager: [phone] After 5pm on weekdays and 1pm on weekends: On Call pager [phone]

## 2019-11-22 NOTE — Progress Notes (Signed)
Pt. Discharged in stable condition. Transported by Corey Harold to [place]. Report called to nurse at [facility].

## 2019-11-22 NOTE — Progress Notes (Signed)
Occupational Therapy Treatment Patient Details Name: [name] [name] MRN: [id number] DOB: December 18, 1934 Today's Date: 11/22/2019    History of present illness Mr Farouk Vivero is an 85 year old male with PMHx of left MCA and occipital embolic stroke without residual deficits, type II diabetes mellitus, seizures on keppra, endorses that he was in his usual state of health until earlier this evening when he endorsed feeling weakness in his bilateral lower extremities.  Work up includes [employer] PE seen on CTA and AKI   OT comments  Pt assisted OOB for toileting and standing grooming activities with mod to min assist. Returned to bed and set up pt's tray for breakfast. Updated d/c disposition to SNF.  Follow Up Recommendations  SNF;Supervision/Assistance - 24 hour    Equipment Recommendations       Recommendations for Other Services      Precautions / Restrictions Precautions Precautions: Fall Restrictions Weight Bearing Restrictions: No       Mobility Bed Mobility Overal bed mobility: Needs Assistance Bed Mobility: Supine to Sit;Sit to Supine     Supine to sit: Mod assist;HOB elevated Sit to supine: Min assist;HOB elevated   General bed mobility comments: mod assist to elevate trunk, min assist for LEs back into bed  Transfers Overall transfer level: Needs assistance Equipment used: Rolling walker (2 wheeled) Transfers: Sit to/from Stand Sit to Stand: Min assist;From elevated surface         General transfer comment: verbal cues for hand placement, assist to rise and steady    Balance Overall balance assessment: Needs assistance   Sitting balance-Leahy Scale: Fair       Standing balance-Leahy Scale: Poor Standing balance comment: leans on sink when washing hands                           ADL either performed or assessed with clinical judgement   ADL Overall ADL's : Needs assistance/impaired Eating/Feeding: Minimal assistance;Bed level Eating/Feeding  Details (indicate cue type and reason): assist to open containers, cut food Grooming: Wash/dry hands;Wash/dry face;Standing;Min guard                   Toilet Transfer: Minimal assistance;Transfer board;RW   Writer and Hygiene: Moderate assistance;Sit to/from stand       Functional mobility during ADLs: Minimal assistance;Rolling walker       Vision       Perception     Praxis      Cognition Arousal/Alertness: Awake/alert Behavior During Therapy: WFL for tasks assessed/performed Overall Cognitive Status: Within Functional Limits for tasks assessed                                          Exercises     Shoulder Instructions       General Comments      Pertinent Vitals/ Pain       Pain Assessment: Faces Faces Pain Scale: No hurt  Home Living                                          Prior Functioning/Environment              Frequency  Min 2X/week        Progress Toward  Goals  OT Goals(current goals can now be found in the care plan section)  Progress towards OT goals: Progressing toward goals  Acute Rehab OT Goals Patient Stated Goal: go to rehab to get stronger OT Goal Formulation: With patient Time For Goal Achievement: 12/03/19 Potential to Achieve Goals: Good  Plan Discharge plan needs to be updated    Co-evaluation                 AM-PAC OT "6 Clicks" Daily Activity     Outcome Measure   Help from another person eating meals?: A Little Help from another person taking care of personal grooming?: A Little Help from another person toileting, which includes using toliet, bedpan, or urinal?: A Lot Help from another person bathing (including washing, rinsing, drying)?: A Lot Help from another person to put on and taking off regular upper body clothing?: A Little Help from another person to put on and taking off regular lower body clothing?: A Lot 6 Click Score:  15    End of Session Equipment Utilized During Treatment: Gait belt;Rolling walker  OT Visit Diagnosis: Unsteadiness on feet (R26.81)   Activity Tolerance Patient tolerated treatment well   Patient Left in bed;with call bell/phone within reach;with bed alarm set   Nurse Communication          Time: 2449-7530 OT Time Calculation (min): 15 min  Charges: OT General Charges $OT Visit: 1 Visit OT Treatments $Self Care/Home Management : 8-22 mins  Nestor Lewandowsky, OTR/L Acute Rehabilitation Services Pager: [phone] Office: [phone]   Malka So 11/22/2019, 9:34 AM

## 2020-02-04 IMAGING — CT CT HEAD W/O CM
4 series · 16 of 47 positions shown, 18 images · non-contrast
Comparison: Brain MR dated 12/22/2016 and head CT dated 12/21/2016.

CLINICAL DATA: Right-sided weakness and headache since waking up
this morning.

EXAM:
CT HEAD WITHOUT CONTRAST
TECHNIQUE: Contiguous axial images were obtained from the base of the skull
through the vertex without intravenous contrast.

[Series 3: head wo · axial · 0.43mm/px · z∈[-82,+38]mm · 7 of 34 slices shown, 9 images]
[im 5/34  brain]
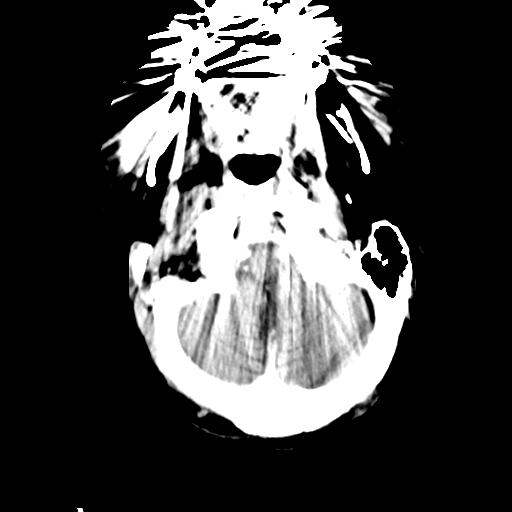
[im 5/34  bone]
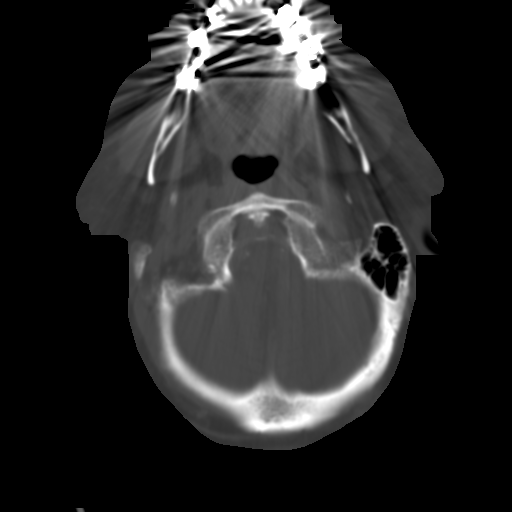
[im 9/34  brain]
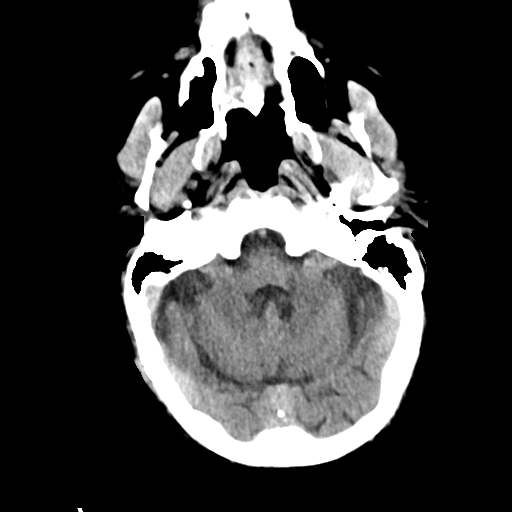
[im 13/34  brain]
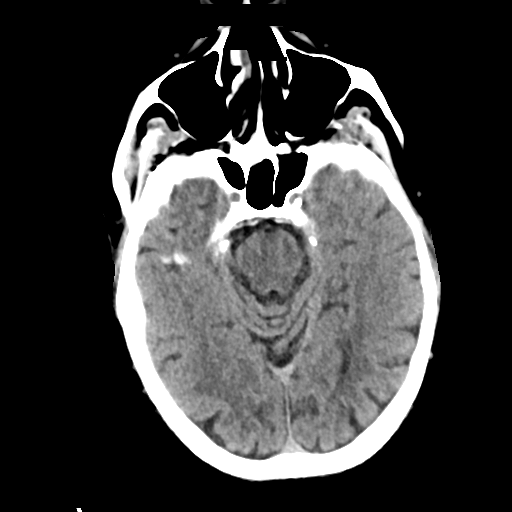
[im 17/34  brain]
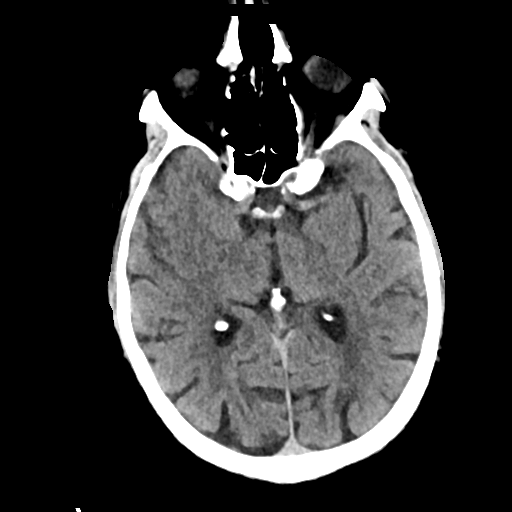
[im 21/34  brain]
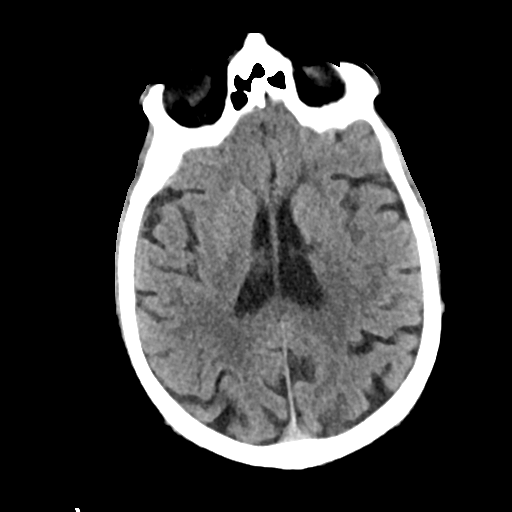
[im 21/34  bone]
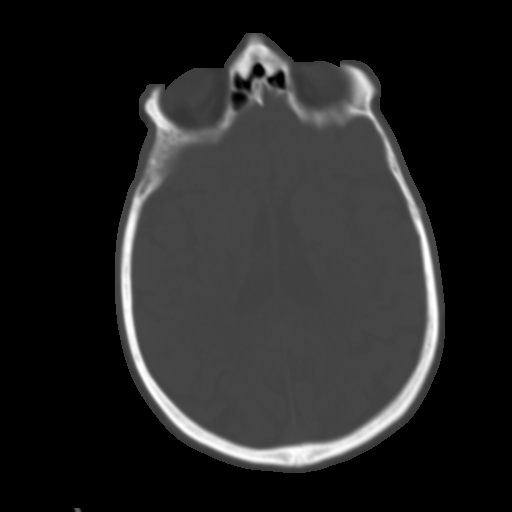
[im 25/34  brain]
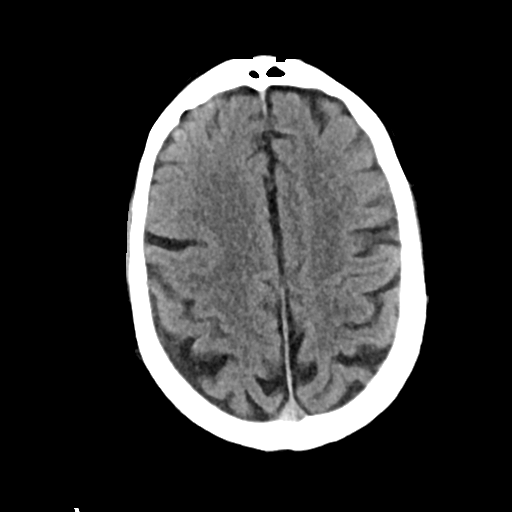
[im 29/34  brain]
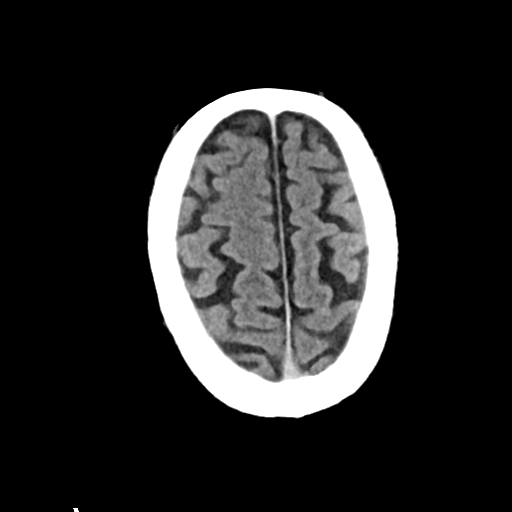

[Series 4: head bone · axial · 0.43mm/px · z∈[-86,-52]mm · 3 of 85 slices shown]
[im 9/85  bone]
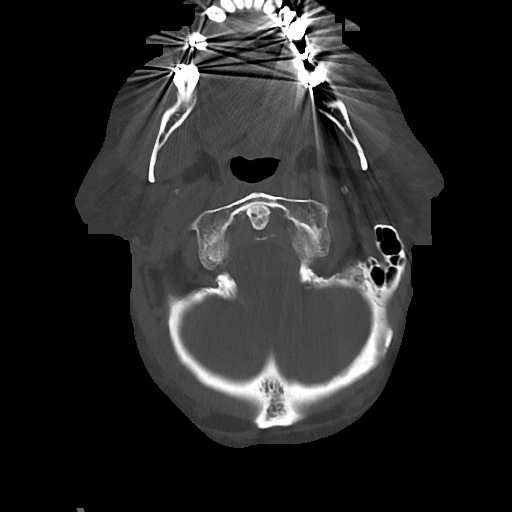
[im 17/85  bone]
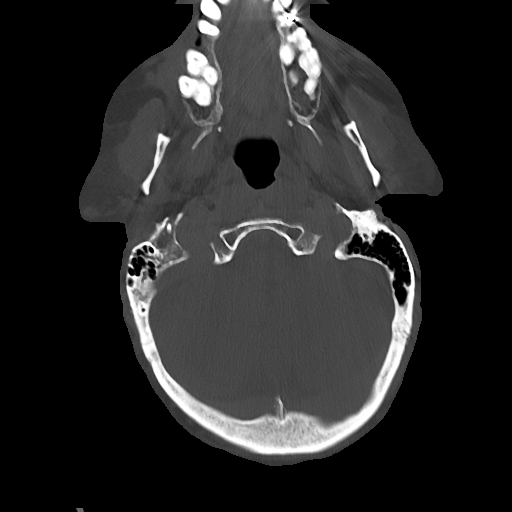
[im 26/85  bone]
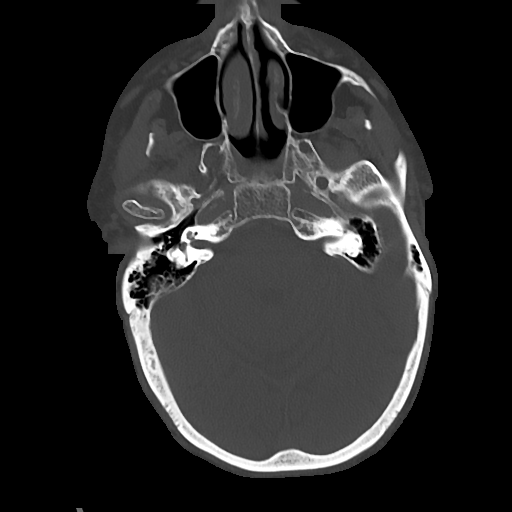

[Series 5: cor soft · coronal · 0.36mm/px · 3 of 68 slices shown]
[im 23/68  brain]
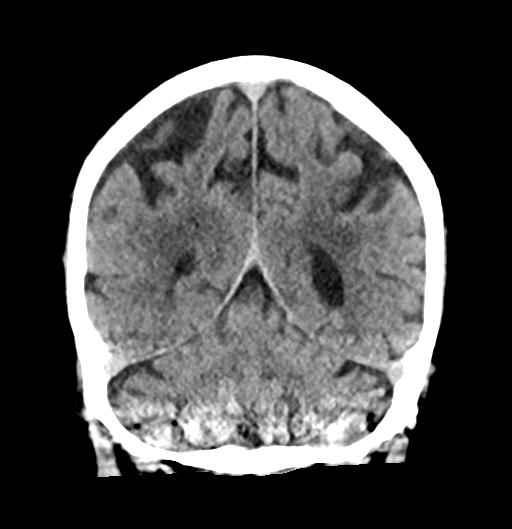
[im 30/68  brain]
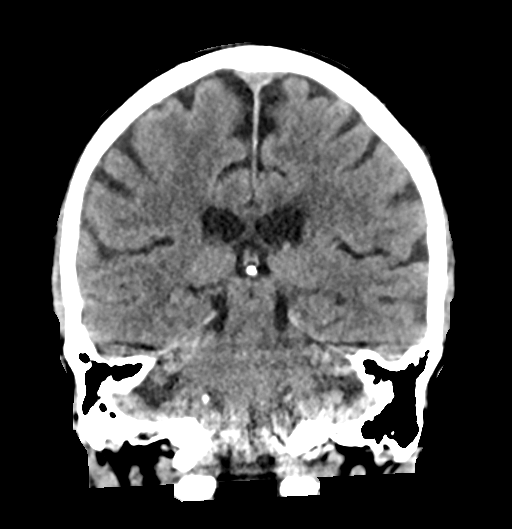
[im 38/68  brain]
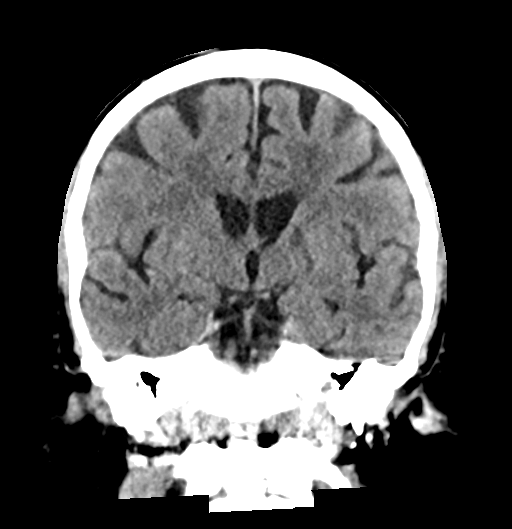

[Series 6: sag soft · sagittal · 0.33mm/px · 3 of 51 slices shown]
[im 17/51  brain]
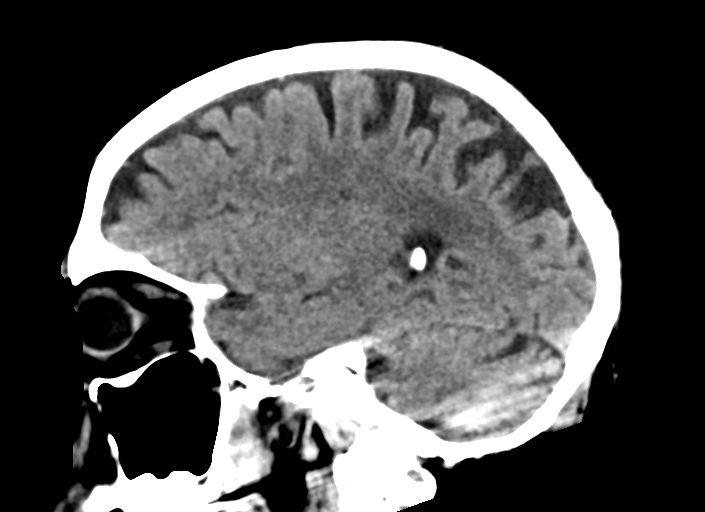
[im 26/51  brain]
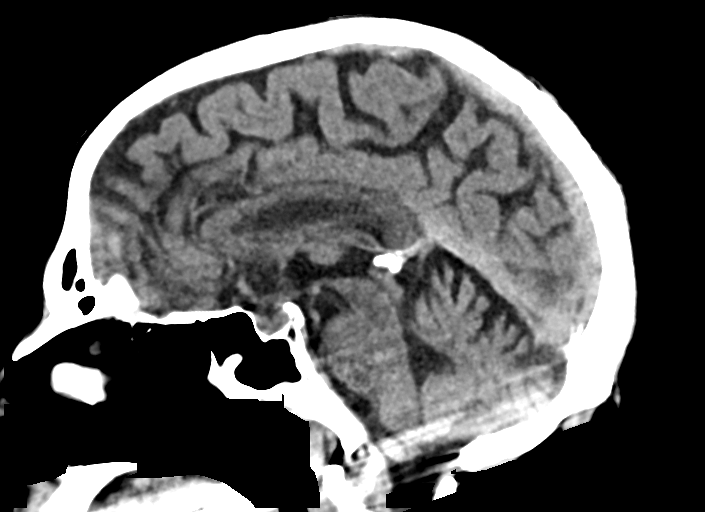
[im 34/51  brain]
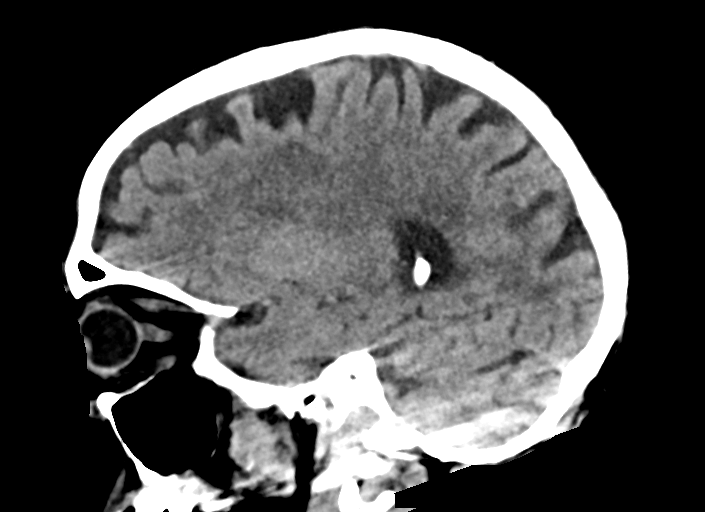

[16 of 47 positions shown; findings below may reference images not displayed]

FINDINGS: Brain: Stable mildly enlarged ventricles and mildly to moderately
enlarged subarachnoid spaces. Minimal patchy white matter low
density in both cerebral hemispheres without significant change.
Stable old left caudate lacunar infarct. No intracranial hemorrhage,
mass lesion or CT evidence of acute infarction.

Vascular: No hyperdense vessel or unexpected calcification.

Skull: Normal. Negative for fracture or focal lesion.

Sinuses/Orbits: No acute finding.

Other: Again demonstrated is a right frontal scalp lipoma measuring
0.2 x 0.5 cm on image number 66 series 4.
IMPRESSION: 1. No acute abnormality.
2. Stable mild atrophy, minimal chronic small vessel white matter
ischemic changes in both cerebral hemispheres and old left caudate
lacunar infarct.
3. Stable right frontal scalp lipoma.

## 2020-06-14 DEATH — deceased
